# Patient Record
Sex: Male | Born: 1945
Health system: Southern US, Community
[De-identification: ages and names within clinical notes are randomized; demographics above are authoritative.]

## PROBLEM LIST (undated history)

## (undated) DIAGNOSIS — G510 Bell's palsy: Secondary | ICD-10-CM

## (undated) DIAGNOSIS — I639 Cerebral infarction, unspecified: Secondary | ICD-10-CM

## (undated) DIAGNOSIS — I119 Hypertensive heart disease without heart failure: Secondary | ICD-10-CM

## (undated) DIAGNOSIS — K219 Gastro-esophageal reflux disease without esophagitis: Secondary | ICD-10-CM

## (undated) DIAGNOSIS — I10 Essential (primary) hypertension: Secondary | ICD-10-CM

## (undated) DIAGNOSIS — I25110 Atherosclerotic heart disease of native coronary artery with unstable angina pectoris: Secondary | ICD-10-CM

## (undated) DIAGNOSIS — I219 Acute myocardial infarction, unspecified: Secondary | ICD-10-CM

## (undated) DIAGNOSIS — E1142 Type 2 diabetes mellitus with diabetic polyneuropathy: Secondary | ICD-10-CM

## (undated) DIAGNOSIS — E7800 Pure hypercholesterolemia, unspecified: Secondary | ICD-10-CM

## (undated) DIAGNOSIS — E119 Type 2 diabetes mellitus without complications: Secondary | ICD-10-CM

## (undated) HISTORY — PX: CARDIAC SURGERY: SHX584

## (undated) HISTORY — PX: OTHER SURGICAL HISTORY: SHX169

## (undated) HISTORY — DX: Gastro-esophageal reflux disease without esophagitis: K21.9

## (undated) HISTORY — DX: Essential (primary) hypertension: I10

## (undated) HISTORY — DX: Acute myocardial infarction, unspecified: I21.9

---
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
## 2010-09-18 ENCOUNTER — Ambulatory Visit
Admission: RE | Admit: 2010-09-18 | Discharge: 2010-09-18 | Disposition: A | Discharge status: Home / Self Care | Payer: Medicare HMO | Source: Ambulatory Visit | Attending: Internal Medicine | Admitting: Internal Medicine

## 2010-09-18 ENCOUNTER — Other Ambulatory Visit: Payer: Self-pay | Admitting: Internal Medicine

## 2010-09-18 DIAGNOSIS — R053 Chronic cough: Secondary | ICD-10-CM

## 2010-09-18 DIAGNOSIS — R05 Cough: Secondary | ICD-10-CM

## 2011-06-27 ENCOUNTER — Other Ambulatory Visit: Payer: Self-pay | Admitting: Diagnostic Neuroimaging

## 2011-06-27 DIAGNOSIS — G510 Bell's palsy: Secondary | ICD-10-CM

## 2011-07-10 ENCOUNTER — Ambulatory Visit
Admission: RE | Admit: 2011-07-10 | Discharge: 2011-07-10 | Disposition: A | Discharge status: Home / Self Care | Payer: Medicare HMO | Source: Ambulatory Visit | Attending: Diagnostic Neuroimaging | Admitting: Diagnostic Neuroimaging

## 2011-07-10 DIAGNOSIS — G510 Bell's palsy: Secondary | ICD-10-CM

## 2011-07-10 MED ORDER — GADOBENATE DIMEGLUMINE 529 MG/ML IV SOLN
20.0000 mL | Freq: Once | INTRAVENOUS | Status: AC | PRN
  Administered 2011-07-10: 20 mL via INTRAVENOUS

## 2011-07-22 ENCOUNTER — Other Ambulatory Visit: Payer: Medicare HMO

## 2011-12-24 ENCOUNTER — Ambulatory Visit: Payer: Medicare HMO | Admitting: *Deleted

## 2012-01-16 ENCOUNTER — Encounter: Payer: Self-pay | Admitting: *Deleted

## 2012-01-16 ENCOUNTER — Encounter: Payer: Medicare HMO | Attending: Specialist | Admitting: *Deleted

## 2012-01-16 DIAGNOSIS — E119 Type 2 diabetes mellitus without complications: Secondary | ICD-10-CM | POA: Insufficient documentation

## 2012-01-16 DIAGNOSIS — Z713 Dietary counseling and surveillance: Secondary | ICD-10-CM | POA: Insufficient documentation

## 2012-01-16 NOTE — Progress Notes (Signed)
  Medical Nutrition Therapy:  Appt start time: 1030 end time:  1130.  Assessment:  Primary concerns today: patient here with his nephew for diabetes education. He states history of Diabetes for the past 2 years with no diabetes education before today. He enjoys swimming and would like to incorporate that as an activity plan. He is not checking his BG at this time. He is in the habit of skipping meals often and nephew prepares his evening meal as he does not cook.  MEDICATIONS: see list. Diabetes medication is currently Metformin   DIETARY INTAKE:  Usual eating pattern includes 1 meals and 1-2 snacks per day.  Everyday foods include whatever his nephew prepares.  Avoided foods include asparagus, ham, milk.    24-hr recall:  B ( AM): skip  Snk ( AM): none  L ( PM): none Snk ( PM): fresh fruit occasionally D ( PM): nephew cooks usually, meat, starch occasionally, vegetables, OR sandwich Snk ( PM): pears, fewer crackers and PNB than before, make a sandwich, occasional apple pie Beverages: water, ginger-ale,   Usual physical activity: none right now, likes to swim  Estimated energy needs: 1500 calories 170 g carbohydrates 112 g protein 42 g fat  Progress Towards Goal(s):  In progress.   Nutritional Diagnosis:  NI-1.5 Excessive energy intake As related to activity level.  As evidenced by BMI of 38.8.    Intervention:  Nutrition counseling and diabetes education initiated. Discussed basic physiology of diabetes, SMBG and rationale of checking BG at alternate times of day, A1c, Carb Counting and reading food labels, and benefits of increased activity.  Handouts given during visit include: Living Well with Diabetes Carb Counting and Food Label handouts Meal Plan Card  Monitoring/Evaluation:  Dietary intake, exercise, food labels, and body weight in 4 week(s).

## 2012-01-21 NOTE — Patient Instructions (Addendum)
Goals:  Follow Diabetes Meal Plan as instructed  Eat 3 meals and 2 snacks, every 3-5 hrs  Limit carbohydrate intake to 60 grams carbohydrate/meal  Limit carbohydrate intake to 0-30 grams carbohydrate/snack  Add lean protein foods to meals/snacks  Monitor glucose levels as instructed by your doctor  Aim for 15-30 mins of physical activity daily  Bring food record and glucose log to your next nutrition visit

## 2012-02-13 ENCOUNTER — Encounter: Payer: Self-pay | Admitting: *Deleted

## 2012-02-13 ENCOUNTER — Encounter: Payer: Medicare HMO | Attending: Specialist | Admitting: *Deleted

## 2012-02-13 DIAGNOSIS — E119 Type 2 diabetes mellitus without complications: Secondary | ICD-10-CM | POA: Insufficient documentation

## 2012-02-13 DIAGNOSIS — Z713 Dietary counseling and surveillance: Secondary | ICD-10-CM | POA: Insufficient documentation

## 2012-02-13 NOTE — Patient Instructions (Addendum)
Plan: Stop at [facility] to determine insurance coverage for membership Consider swimming 3 days a week to increase activity level Consider walking at the [facility] if swimming doesn't work out or in addition to swimming as able Continue checking your Blood sugar 1-2 times a day as directed by MD Consider choosing to have dessert only 2 days/nights a week, such as Tuesday and Friday. Be sure and check your BG after you exercise to see the benefit to your diabetes management.

## 2012-02-13 NOTE — Progress Notes (Signed)
  Medical Nutrition Therapy:  Appt start time: 1030 end time:  1100.  Assessment:  Primary concerns today: patient here for follow up visit for diabetes. He is alone this visit. Weight indicates weight gain of 4 pounds in past month. He states he has changed his food choices but not his habits of skipping meals all day and eating excessively at night. He still consumes higher carb foods and desserts but is more aware of these choices since last visit. Still not exercising but interested in joining [employer] to swim if his insurance will cover the cost. He is now checking his BG twice daily and states they are typically in the 120 - 180 mg/dl range. He also states he is buying a house and getting ready to move.  MEDICATIONS: see list. Diabetes medication is currently Metformin   DIETARY INTAKE:  Usual eating pattern includes 1 meals and 1-2 snacks per day.  Everyday foods include whatever his nephew prepares.  Avoided foods include asparagus, ham, milk.    24-hr recall:  B ( AM): continues to skip  Snk ( AM): none  L ( PM): none Snk ( PM): fresh fruit occasionally D ( PM): nephew cooks usually, meat, starch occasionally, vegetables, OR sandwich Snk ( PM): pears, fewer crackers and PNB than before, make a sandwich, occasional apple pie Beverages: water, ginger-ale,   Usual physical activity: none right now, likes to swim. Plans to stop at [facility] after his appointments today to determine if his insurance will cover the cost. Also mentioned walking at the mall as a back up plan to swimming.  Estimated energy needs: 1500 calories 170 g carbohydrates 112 g protein 42 g fat  Progress Towards Goal(s):  In progress.   Nutritional Diagnosis:  NI-1.5 Excessive energy intake As related to activity level.  As evidenced by BMI of 38.8 which increased today to 39.3.    Intervention:  Reviewed all the benefits to increasing his activity level, commended him on testing his BG more often, and acknowledged  his frequent choice for desserts, suggesting he choose 2 specific days a week rather than no plan at all. Encouraged him to stop at [facility] today to see what his options for pool exercises might be. Reviewed Carb Counting concept and to continue with SMBG.  Monitoring/Evaluation:  Dietary intake, exercise, food labels, and body weight again in 4 week(s).

## 2012-03-12 ENCOUNTER — Ambulatory Visit: Payer: Medicare HMO | Admitting: *Deleted

## 2012-03-26 ENCOUNTER — Ambulatory Visit: Payer: Medicare HMO | Admitting: *Deleted

## 2012-04-06 ENCOUNTER — Ambulatory Visit: Payer: Medicare HMO | Admitting: *Deleted

## 2012-06-04 ENCOUNTER — Encounter (HOSPITAL_COMMUNITY): Payer: Self-pay | Admitting: Cardiology

## 2012-06-04 ENCOUNTER — Emergency Department (HOSPITAL_COMMUNITY)
Admission: EM | Admit: 2012-06-04 | Discharge: 2012-06-04 | Disposition: A | Discharge status: Home / Self Care | Payer: Medicare HMO | Attending: Emergency Medicine | Admitting: Emergency Medicine

## 2012-06-04 DIAGNOSIS — M19019 Primary osteoarthritis, unspecified shoulder: Secondary | ICD-10-CM | POA: Insufficient documentation

## 2012-06-04 DIAGNOSIS — Z7982 Long term (current) use of aspirin: Secondary | ICD-10-CM | POA: Insufficient documentation

## 2012-06-04 DIAGNOSIS — Z87891 Personal history of nicotine dependence: Secondary | ICD-10-CM | POA: Insufficient documentation

## 2012-06-04 DIAGNOSIS — Z79899 Other long term (current) drug therapy: Secondary | ICD-10-CM | POA: Insufficient documentation

## 2012-06-04 DIAGNOSIS — K219 Gastro-esophageal reflux disease without esophagitis: Secondary | ICD-10-CM | POA: Insufficient documentation

## 2012-06-04 DIAGNOSIS — K089 Disorder of teeth and supporting structures, unspecified: Secondary | ICD-10-CM | POA: Insufficient documentation

## 2012-06-04 DIAGNOSIS — M1990 Unspecified osteoarthritis, unspecified site: Secondary | ICD-10-CM

## 2012-06-04 DIAGNOSIS — I10 Essential (primary) hypertension: Secondary | ICD-10-CM | POA: Insufficient documentation

## 2012-06-04 DIAGNOSIS — I252 Old myocardial infarction: Secondary | ICD-10-CM | POA: Insufficient documentation

## 2012-06-04 DIAGNOSIS — J029 Acute pharyngitis, unspecified: Secondary | ICD-10-CM | POA: Insufficient documentation

## 2012-06-04 DIAGNOSIS — E119 Type 2 diabetes mellitus without complications: Secondary | ICD-10-CM | POA: Insufficient documentation

## 2012-06-04 DIAGNOSIS — K0889 Other specified disorders of teeth and supporting structures: Secondary | ICD-10-CM

## 2012-06-04 MED ORDER — HYDROCODONE-ACETAMINOPHEN 5-325 MG PO TABS
2.0000 | ORAL_TABLET | Freq: Once | ORAL | Status: AC
  Administered 2012-06-04: 2 via ORAL
  Filled 2012-06-04: qty 2

## 2012-06-04 MED ORDER — HYDROCODONE-ACETAMINOPHEN 5-325 MG PO TABS: 2.0000 | ORAL_TABLET | ORAL | Status: DC | PRN

## 2012-06-04 MED ORDER — PENICILLIN V POTASSIUM 250 MG PO TABS
500.0000 mg | ORAL_TABLET | Freq: Once | ORAL | Status: AC
  Administered 2012-06-04: 500 mg via ORAL
  Filled 2012-06-04: qty 2

## 2012-06-04 MED ORDER — PENICILLIN V POTASSIUM 500 MG PO TABS: 500.0000 mg | ORAL_TABLET | Freq: Three times a day (TID) | ORAL | Status: DC

## 2012-06-04 NOTE — ED Notes (Signed)
Reports arthritis in his shoulders and also his tooth on the back left side broke off and cut his tongue.

## 2012-06-04 NOTE — ED Provider Notes (Signed)
History     CSN: [id number]  Arrival date & time 06/04/12  1035   First MD Initiated Contact with Patient 06/04/12 1105      Chief Complaint  Patient presents with  . Shoulder Pain  . Dental Pain    (Consider location/radiation/quality/duration/timing/severity/associated sxs/prior treatment) Patient is a 67 y.o. male presenting with shoulder pain, tooth pain, and pharyngitis. The history is provided by the patient.  Shoulder Pain This is a chronic problem. Associated symptoms include a sore throat. Pertinent negatives include no chills or fever. Associated symptoms comments: Bilateral shoulder pain like chronic arthritis pain, currently uncontrolled as he is out of his Lortab 10 mg tablets. No new injury, no new swelling, fever or redness..  Dental PainThe primary symptoms include sore throat. Primary symptoms do not include fever.  Associated symptoms comments: He reports poor dentition generally but now with increased acute pain and a broken tooth that is causing trauma to the tongue..   Sore Throat This is a new problem. The current episode started yesterday. Associated symptoms include a sore throat. Pertinent negatives include no chills or fever. Associated symptoms comments: No fever or difficulty swallowing. .    Past Medical History  Diagnosis Date  . Hypertension   . Myocardial infarction   . Diabetes mellitus   . GERD (gastroesophageal reflux disease)     Past Surgical History  Procedure Date  . Stents     Family History  Problem Relation Age of Onset  . Heart disease Other     History  Substance Use Topics  . Smoking status: Former Smoker    Quit date: 01/16/2007  . Smokeless tobacco: Never Used  . Alcohol Use: Yes     Comment: 2/week, whiskey with Ginger-ale      Review of Systems  Constitutional: Negative for fever and chills.  HENT: Positive for sore throat and dental problem.   Respiratory: Negative.   Cardiovascular: Negative.     Gastrointestinal: Negative.   Musculoskeletal:       See HPI  Skin: Negative.   Neurological: Negative.     Allergies  Review of patient's allergies indicates no known allergies.  Home Medications   Current Outpatient Rx  Name  Route  Sig  Dispense  Refill  . ALOGLIPTIN-PIOGLITAZONE 25-15 MG PO TABS   Oral   Take by mouth daily.         Marland Kitchen AMITRIPTYLINE HCL 150 MG PO TABS   Oral   Take 150 mg by mouth at bedtime.         . ASPIRIN EC 81 MG PO TBEC   Oral   Take 81 mg by mouth daily.         . CHOLINE FENOFIBRATE 135 MG PO CPDR   Oral   Take 135 mg by mouth daily.         Marland Kitchen HYDROCODONE-ACETAMINOPHEN 10-325 MG PO TABS   Oral   Take 1 tablet by mouth every 6 (six) hours as needed. For pain.         Marland Kitchen LIRAGLUTIDE 18 MG/3ML [place] SOLN   Subcutaneous   Inject 1.2 mg into the skin daily.         . LUBIPROSTONE 24 MCG PO CAPS   Oral   Take 24 mcg by mouth 2 (two) times daily with a meal.         . MELOXICAM 15 MG PO TABS   Oral   Take 15 mg by mouth daily.         Marland Kitchen  METFORMIN HCL 1000 MG PO TABS   Oral   Take 1,000 mg by mouth 2 (two) times daily with a meal.         . METOPROLOL SUCCINATE ER 100 MG PO TB24   Oral   Take 100 mg by mouth daily. Take with or immediately following a meal.         . OMEPRAZOLE 40 MG PO CPDR   Oral   Take 40 mg by mouth daily.           BP 152/90  Pulse 93  Temp 98.3 F (36.8 C) (Oral)  Resp 18  SpO2 100%  Physical Exam  Constitutional: He is oriented to person, place, and time. He appears well-developed and well-nourished.  HENT:  Head: Normocephalic.       Poor dentition and partially edentulous. No acute fractures visualized.  Neck: Normal range of motion.  Cardiovascular:       PUlses in distal UE's are present and equal.  Pulmonary/Chest: Effort normal.  Musculoskeletal: Normal range of motion.       Shoulders with FROM bilaterally. No swelling or redness. No warmth.   Neurological: He is alert  and oriented to person, place, and time.  Skin: Skin is warm and dry.  Psychiatric: He has a normal mood and affect.    ED Course  Procedures (including critical care time)  Labs Reviewed - No data to display No results found.   No diagnosis found. 1. Arthritis, chronic 2. Dental pain 3. Sore throat   MDM  Chronic shoulder pain without new finding, out of pain medication. Sore throat where oropharynx appears benign and no fever. Generally poor dentition without visualized abscess but concerning for acutely increased pain and perceived fracture. Will give abx.        Dewaine Oats, PA-C 06/04/12 1150  Dewaine Oats, PA-C 06/18/12 1918

## 2012-06-04 NOTE — ED Notes (Signed)
Pt reports that he has broken molars on left side, up and down, and has bitten his tongue due to broken teeth. Also c/o chronic shoulder pain due to arthritis.

## 2012-12-03 ENCOUNTER — Emergency Department (HOSPITAL_COMMUNITY)
Admission: EM | Admit: 2012-12-03 | Discharge: 2012-12-04 | Payer: Medicare HMO | Attending: Emergency Medicine | Admitting: Emergency Medicine

## 2012-12-03 ENCOUNTER — Encounter (HOSPITAL_COMMUNITY): Payer: Self-pay

## 2012-12-03 ENCOUNTER — Emergency Department (HOSPITAL_COMMUNITY): Payer: Medicare HMO

## 2012-12-03 DIAGNOSIS — Z87891 Personal history of nicotine dependence: Secondary | ICD-10-CM | POA: Insufficient documentation

## 2012-12-03 DIAGNOSIS — E119 Type 2 diabetes mellitus without complications: Secondary | ICD-10-CM | POA: Insufficient documentation

## 2012-12-03 DIAGNOSIS — R079 Chest pain, unspecified: Secondary | ICD-10-CM | POA: Insufficient documentation

## 2012-12-03 DIAGNOSIS — Z7982 Long term (current) use of aspirin: Secondary | ICD-10-CM | POA: Insufficient documentation

## 2012-12-03 DIAGNOSIS — I252 Old myocardial infarction: Secondary | ICD-10-CM | POA: Insufficient documentation

## 2012-12-03 DIAGNOSIS — I10 Essential (primary) hypertension: Secondary | ICD-10-CM | POA: Insufficient documentation

## 2012-12-03 DIAGNOSIS — Z79899 Other long term (current) drug therapy: Secondary | ICD-10-CM | POA: Insufficient documentation

## 2012-12-03 DIAGNOSIS — K219 Gastro-esophageal reflux disease without esophagitis: Secondary | ICD-10-CM | POA: Insufficient documentation

## 2012-12-03 HISTORY — DX: Bell's palsy: G51.0

## 2012-12-03 LAB — BASIC METABOLIC PANEL
BUN: 20 mg/dL (ref 6–23)
CO2: 21 mEq/L (ref 19–32)
Calcium: 10.1 mg/dL (ref 8.4–10.5)
Chloride: 101 mEq/L (ref 96–112)
Creatinine, Ser: 1.53 mg/dL — ABNORMAL HIGH (ref 0.50–1.35)
GFR calc Af Amer: 53 mL/min — ABNORMAL LOW (ref >90)
GFR calc non Af Amer: 46 mL/min — ABNORMAL LOW (ref >90)
Glucose, Bld: 203 mg/dL — ABNORMAL HIGH (ref 70–99)
Potassium: 4 mEq/L (ref 3.5–5.1)
Sodium: 138 mEq/L (ref 135–145)

## 2012-12-03 LAB — CBC WITH DIFFERENTIAL/PLATELET
Basophils Absolute: 0 10*3/uL (ref 0.0–0.1)
Basophils Relative: 0 % (ref 0–1)
Eosinophils Absolute: 0.1 10*3/uL (ref 0.0–0.7)
Eosinophils Relative: 1 % (ref 0–5)
HCT: 41 % (ref 39.0–52.0)
Hemoglobin: 14.3 g/dL (ref 13.0–17.0)
Lymphocytes Relative: 34 % (ref 12–46)
Lymphs Abs: 2.9 10*3/uL (ref 0.7–4.0)
MCH: 30.8 pg (ref 26.0–34.0)
MCHC: 34.9 g/dL (ref 30.0–36.0)
MCV: 88.2 fL (ref 78.0–100.0)
Monocytes Absolute: 0.6 10*3/uL (ref 0.1–1.0)
Monocytes Relative: 7 % (ref 3–12)
Neutro Abs: 5 10*3/uL (ref 1.7–7.7)
Neutrophils Relative %: 58 % (ref 43–77)
Platelets: 168 10*3/uL (ref 150–400)
RBC: 4.65 MIL/uL (ref 4.22–5.81)
RDW: 12.6 % (ref 11.5–15.5)
WBC: 8.6 10*3/uL (ref 4.0–10.5)

## 2012-12-03 LAB — RAPID URINE DRUG SCREEN, HOSP PERFORMED
Amphetamines: NOT DETECTED
Barbiturates: NOT DETECTED
Benzodiazepines: NOT DETECTED
Cocaine: POSITIVE — AB
Opiates: NOT DETECTED
Tetrahydrocannabinol: NOT DETECTED

## 2012-12-03 LAB — POCT I-STAT TROPONIN I: Troponin i, poc: 0 ng/mL (ref 0.00–0.08)

## 2012-12-03 MED ORDER — GI COCKTAIL ~~LOC~~
30.0000 mL | Freq: Once | ORAL | Status: AC
  Administered 2012-12-03: 30 mL via ORAL
  Filled 2012-12-03: qty 30

## 2012-12-03 NOTE — Discharge Instructions (Signed)
Follow up with your primary care doctor if needed. Return to the ED for new or worsening symptoms.

## 2012-12-03 NOTE — ED Provider Notes (Signed)
History    CSN: [id number] Arrival date & time 12/03/12  1934  First MD Initiated Contact with Patient 12/03/12 1945     No chief complaint on file.  (Consider location/radiation/quality/duration/timing/severity/associated sxs/prior Treatment) The history is provided by the patient and medical records.   Patient presents to the ED for chest pain. The patient was being taken to jail for failing to appear in court. Once arriving at the jail he started complaining of chest pain- described as a mid-sternal burning sensation radiating to his throat. Pt does have hx of GERD.  Denies SOB, palpitations, dizziness, weakness, numbness, paresthesias of extremities.  Also notes he's been having some right flank pain with no associated urinary symptoms. No recent fevers, sweats, or chills.  Patient has had a prior MI with cardiac stenting several years ago. Pt has taken rolaids and other OTC antacids without relief of sx.  Has previously scheduled FU with PCP next week.  Past Medical History  Diagnosis Date  . Hypertension   . Myocardial infarction   . Diabetes mellitus   . GERD (gastroesophageal reflux disease)    Past Surgical History  Procedure Laterality Date  . Stents     Family History  Problem Relation Age of Onset  . Heart disease Other    History  Substance Use Topics  . Smoking status: Former Smoker    Quit date: 01/16/2007  . Smokeless tobacco: Never Used  . Alcohol Use: Yes     Comment: 2/week, whiskey with Ginger-ale    Review of Systems  Cardiovascular: Positive for chest pain.  All other systems reviewed and are negative.    Allergies  Review of patient's allergies indicates no known allergies.  Home Medications   Current Outpatient Rx  Name  Route  Sig  Dispense  Refill  . Alogliptin-Pioglitazone (OSENI) 25-15 MG TABS   Oral   Take 1 tablet by mouth daily.          Marland Kitchen amitriptyline (ELAVIL) 150 MG tablet   Oral   Take 150 mg by mouth at bedtime.         Marland Kitchen aspirin EC 81 MG tablet   Oral   Take 81 mg by mouth daily.         . chlordiazePOXIDE (LIBRIUM) 10 MG capsule   Oral   Take 10 mg by mouth 2 (two) times daily.         . Choline Fenofibrate (TRILIPIX) 135 MG capsule   Oral   Take 135 mg by mouth daily.         . diazepam (VALIUM) 5 MG tablet   Oral   Take 5 mg by mouth at bedtime.         Marland Kitchen HYDROcodone-acetaminophen (NORCO) 10-325 MG per tablet   Oral   Take 1 tablet by mouth every 6 (six) hours as needed. For pain.         . Liraglutide (VICTOZA) 18 MG/3ML SOLN   Subcutaneous   Inject 1.2 mg into the skin daily.         Marland Kitchen lubiprostone (AMITIZA) 24 MCG capsule   Oral   Take 24 mcg by mouth 2 (two) times daily with a meal.         . meloxicam (MOBIC) 15 MG tablet   Oral   Take 15 mg by mouth 2 (two) times daily.          . metFORMIN (GLUCOPHAGE) 1000 MG tablet   Oral   Take 1,000  mg by mouth 2 (two) times daily with a meal.         . metoprolol succinate (TOPROL-XL) 100 MG 24 hr tablet   Oral   Take 100 mg by mouth daily. Take with or immediately following a meal.         . omeprazole (PRILOSEC) 40 MG capsule   Oral   Take 40 mg by mouth daily.          There were no vitals taken for this visit.  Physical Exam  Nursing note and vitals reviewed. Constitutional: He is oriented to person, place, and time. He appears well-developed and well-nourished.  HENT:  Head: Normocephalic and atraumatic.  Mouth/Throat: Oropharynx is clear and moist.  Eyes: Conjunctivae and EOM are normal. Pupils are equal, round, and reactive to light.  Right lower eyelid droop and tearing- remnant of prior bell's palsy; pinpoint pupils, ERRL  Neck: Normal range of motion. Neck supple.  Cardiovascular: Normal rate, regular rhythm and normal heart sounds.   Pulmonary/Chest: Effort normal and breath sounds normal. No respiratory distress. He has no wheezes.    Abdominal: Soft. Bowel sounds are normal. There is no  tenderness. There is no guarding and no CVA tenderness.    Musculoskeletal: Normal range of motion. He exhibits no edema.  Neurological: He is alert and oriented to person, place, and time. He has normal strength. No cranial nerve deficit or sensory deficit.  Skin: Skin is warm and dry.  Psychiatric: He has a normal mood and affect.    ED Course  Procedures (including critical care time)   Date: 12/03/2012  Rate: 95  Rhythm: normal sinus rhythm  QRS Axis: normal  Intervals: normal  ST/T Wave abnormalities: normal  Conduction Disutrbances:none  Narrative Interpretation: borderline LAD, no STEMI  Old EKG Reviewed: unchanged   Labs Reviewed  BASIC METABOLIC PANEL - Abnormal; Notable for the following:    Glucose, Bld 203 (*)    Creatinine, Ser 1.53 (*)    GFR calc non Af Amer 46 (*)    GFR calc Af Amer 53 (*)    All other components within normal limits  URINE RAPID DRUG SCREEN (HOSP PERFORMED) - Abnormal; Notable for the following:    Cocaine POSITIVE (*)    All other components within normal limits  CBC WITH DIFFERENTIAL  POCT I-STAT TROPONIN I   Dg Chest 2 View  12/03/2012   *RADIOLOGY REPORT*  Clinical Data: Chest pain.  CHEST - 2 VIEW  Comparison: None.  Findings: Cardiopericardial silhouette within normal limits.  Mild atelectasis at the lung bases.  Trachea midline.  Mediastinal contours normal.  No airspace disease.  No pleural effusion.  IMPRESSION: Mild basilar atelectasis.  No acute cardiopulmonary disease.   Original Report Authenticated By: Dereck Ligas, M.D.   1. Chest pain     MDM   EKG NSR, no acute ischemic changes.  Trop negative.  CXR clear.  UDS + for cocaine.  Labs largely WNL as above.  Chest pain resolved with GI cocktail-- suspicion that pain is GERD related.  Doubt ACS, PE, dissection, or other vascular compromise.  Pt afebrile, non-toxic appearing, NAD, VS stable- ok for discharge.  Pt released to [employer] custody.    Larene Pickett, PA-C 12/04/12  0003

## 2012-12-03 NOTE — ED Notes (Signed)
Patient presents to ED via [place]. Patient c/o chest pain that began 2 days ago. Pt describes the pain as "burning in central chest." Pt told EMS that he had an MI in the past and the symptoms today feel the same as then. Denies any shortness of breath, stated that he vomited x1 this am. Patient also c/o right flank pain. Denies burning with urination. Denies injury/ trauma to that area. Escorted by [employer]. EMS gave 324 ASA and started 20G in left AC. BP 119/83. HR 120. A&Ox4 and no acute distress noted at this time.

## 2012-12-07 NOTE — ED Provider Notes (Signed)
Medical screening examination/treatment/procedure(s) were performed by non-physician practitioner and as supervising physician I was immediately available for consultation/collaboration.  Richarda Blade, MD 12/07/12 2152

## 2014-11-28 ENCOUNTER — Encounter (HOSPITAL_COMMUNITY): Payer: Self-pay | Admitting: Emergency Medicine

## 2014-11-28 ENCOUNTER — Emergency Department (HOSPITAL_COMMUNITY): Payer: Commercial Managed Care - HMO

## 2014-11-28 ENCOUNTER — Emergency Department (HOSPITAL_COMMUNITY)
Admission: EM | Admit: 2014-11-28 | Discharge: 2014-11-28 | Disposition: A | Discharge status: Home / Self Care | Payer: Commercial Managed Care - HMO | Attending: Emergency Medicine | Admitting: Emergency Medicine

## 2014-11-28 DIAGNOSIS — N189 Chronic kidney disease, unspecified: Secondary | ICD-10-CM | POA: Diagnosis not present

## 2014-11-28 DIAGNOSIS — M25552 Pain in left hip: Secondary | ICD-10-CM

## 2014-11-28 DIAGNOSIS — S79912A Unspecified injury of left hip, initial encounter: Secondary | ICD-10-CM | POA: Diagnosis not present

## 2014-11-28 DIAGNOSIS — I10 Essential (primary) hypertension: Secondary | ICD-10-CM

## 2014-11-28 DIAGNOSIS — I2510 Atherosclerotic heart disease of native coronary artery without angina pectoris: Secondary | ICD-10-CM | POA: Insufficient documentation

## 2014-11-28 DIAGNOSIS — W109XXA Fall (on) (from) unspecified stairs and steps, initial encounter: Secondary | ICD-10-CM | POA: Insufficient documentation

## 2014-11-28 DIAGNOSIS — E119 Type 2 diabetes mellitus without complications: Secondary | ICD-10-CM | POA: Diagnosis not present

## 2014-11-28 DIAGNOSIS — Z8673 Personal history of transient ischemic attack (TIA), and cerebral infarction without residual deficits: Secondary | ICD-10-CM | POA: Diagnosis not present

## 2014-11-28 DIAGNOSIS — Z8669 Personal history of other diseases of the nervous system and sense organs: Secondary | ICD-10-CM | POA: Insufficient documentation

## 2014-11-28 DIAGNOSIS — Y9389 Activity, other specified: Secondary | ICD-10-CM | POA: Diagnosis not present

## 2014-11-28 DIAGNOSIS — I129 Hypertensive chronic kidney disease with stage 1 through stage 4 chronic kidney disease, or unspecified chronic kidney disease: Secondary | ICD-10-CM | POA: Insufficient documentation

## 2014-11-28 DIAGNOSIS — Z9861 Coronary angioplasty status: Secondary | ICD-10-CM | POA: Insufficient documentation

## 2014-11-28 DIAGNOSIS — Y9289 Other specified places as the place of occurrence of the external cause: Secondary | ICD-10-CM | POA: Insufficient documentation

## 2014-11-28 DIAGNOSIS — Y998 Other external cause status: Secondary | ICD-10-CM | POA: Insufficient documentation

## 2014-11-28 HISTORY — DX: Type 2 diabetes mellitus without complications: E11.9

## 2014-11-28 HISTORY — DX: Cerebral infarction, unspecified: I63.9

## 2014-11-28 LAB — COMPREHENSIVE METABOLIC PANEL
ALT: 25 U/L (ref 17–63)
AST: 26 U/L (ref 15–41)
Albumin: 3.7 g/dL (ref 3.5–5.0)
Alkaline Phosphatase: 69 U/L (ref 38–126)
Anion gap: 10 (ref 5–15)
BUN: 11 mg/dL (ref 6–20)
CO2: 24 mmol/L (ref 22–32)
Calcium: 9 mg/dL (ref 8.9–10.3)
Chloride: 106 mmol/L (ref 101–111)
Creatinine, Ser: 1.42 mg/dL — ABNORMAL HIGH (ref 0.61–1.24)
GFR calc Af Amer: 58 mL/min — ABNORMAL LOW (ref >60)
GFR calc non Af Amer: 50 mL/min — ABNORMAL LOW (ref >60)
Glucose, Bld: 167 mg/dL — ABNORMAL HIGH (ref 65–99)
Potassium: 4.2 mmol/L (ref 3.5–5.1)
Sodium: 140 mmol/L (ref 135–145)
Total Bilirubin: 0.6 mg/dL (ref 0.3–1.2)
Total Protein: 6.8 g/dL (ref 6.5–8.1)

## 2014-11-28 NOTE — ED Notes (Signed)
During the middle of the night fell due to dizziness. Injured left hip. Pt ambulatory, distal pulses intact. Pt has previous hx of stroke, states slurred speech and facial droop are unchanged from old stroke.

## 2014-11-28 NOTE — ED Provider Notes (Signed)
CSN: [id number]     Arrival date & time 11/28/14  0720 History   First MD Initiated Contact with Patient 11/28/14 [phone]     Chief Complaint  Patient presents with  . Hip Pain  . Fall   HPI  David Burton is a 69 yo male with PMHx of CVA, CAD s/p 2 stents, T2DM, HLD, HTN, and Bell's Palsy who presents with complaint of left hip pain. Patient states he fell down 5 steps last night and primarily landed on his left side. He believes he caught himself with his hands and did not hit his head. Patient admits to having 4-5 shots of liquor last night in addition to taking an Ambien. He denies any lightheadedness, syncope, chest pain, shortness of breath or new stroke symptoms. Patient does have residual right side facial weakness from Bell's Palsy.   Currently pain is located in posterior left hip without radiation. Pain is an 8/10, worse with movement and ambulation. He denies any numbness, bruising or difficulty ambulating. He has not taken anything for the pain.   Past Medical History  Diagnosis Date  . Stroke   . Hypertension   . Diabetes mellitus without complication    Past Surgical History  Procedure Laterality Date  . Cardiac surgery     No family history on file. History  Substance Use Topics  . Smoking status: Not on file  . Smokeless tobacco: Not on file  . Alcohol Use: Yes     Comment: 8-10 beers    Review of Systems General: Denies fatigue, and diaphoresis.  Respiratory: Denies SOB, cough   Cardiovascular: Denies chest pain and palpitations.  Gastrointestinal: Denies nausea, vomiting, abdominal pain Musculoskeletal: Admits to left hip pain and myalgias. Denies back pain, and gait problem.  Skin: Denies bruising and wounds.  Neurological: Denies dizziness, headaches, weakness, lightheadedness, numbness, and syncope. Psychiatric/Behavioral: Denies confusion  Allergies  Review of patient's allergies indicates no known allergies.  Home Medications   Prior to Admission  medications   Not on File   Physical Exam  Filed Vitals:   11/28/14 0730 11/28/14 0735 11/28/14 0800 11/28/14 0845  BP: 158/77 158/77 187/92 166/85  Pulse: 94 90 101   Temp:  97.5 F (36.4 C)    Resp:  16 20   SpO2: 99% 97% 100%    General: Vital signs reviewed.  Patient is well-developed and well-nourished, in no acute distress and cooperative with exam.  HEENT: EOMI. Right facial droop on superior and inferior portions, normal sensation. Cardiovascular: RRR, S1 normal, S2 normal. Pulmonary/Chest: Clear to auscultation bilaterally, no wheezes, rales, or rhonchi. Abdominal: Soft, non-tender, non-distended, BS + Musculoskeletal: Tender on palpation of left lateral hip and femur. No obvious trauma or ecchymosis. Normal ROM. Pain with hip flexion. Extremities: No lower extremity edema bilaterally, pulses symmetric and intact bilaterally.   Neurologic Exam:   Mental Status: Alert, oriented, thought content appropriate.  Speech fluent without evidence of aphasia. Able to follow 3 step commands without difficulty.  Cranial Nerves:   II: Visual fields grossly intact.  III/IV/VI: Extraocular movements intact.  Pupils reactive bilaterally.  V/VII: Smile asymmetric (droop on right)-unchanged from baseline. facial light touch sensation normal bilaterally.  VIII: Grossly intact.  XI: Bilateral shoulder shrug normal.  XII: Midline tongue extension normal.  Motor:  5/5 bilaterally with normal tone and bulk  Sensory:  Pinprick and light touch intact throughout, bilaterally     ED Course  Procedures (including critical care time) Labs Review Labs Reviewed  COMPREHENSIVE METABOLIC PANEL - Abnormal; Notable for the following:    Glucose, Bld 167 (*)    Creatinine, Ser 1.42 (*)    GFR calc non Af Amer 50 (*)    GFR calc Af Amer 58 (*)    All other components within normal limits    Imaging Review Dg Hip Unilat With Pelvis 2-3 Views Left  11/28/2014   CLINICAL DATA:  Status post fall  last night, persistent left hip discomfort  EXAM: LEFT HIP (WITH PELVIS) 2-3 VIEWS  COMPARISON:  None.  FINDINGS: The bony pelvis is adequately mineralized. The observed portions of the sacrum are unremarkable. There is mild symmetric narrowing of the left hip joint space. The femoral head, neck, and intertrochanteric regions are intact. The acetabulum and pubic rami exhibit no definite acute abnormality. Subtle double density in the midportion of the inferior pubic ramus is demonstrated without definite cortical disruption.  IMPRESSION: There is mild degenerative joint space narrowing of the left hip. There is no definite acute fracture nor dislocation.  If the patient's symptoms do not resolve in a fashion consistent with an uncomplicated contusion, MRI would be a useful next imaging step.   Electronically Signed   By: David  Martinique M.D.   On: 11/28/2014 08:29     EKG Interpretation   Date/Time:  Monday November 28 2014 07:36:41 EDT Ventricular Rate:  90 PR Interval:  143 QRS Duration: 79 QT Interval:  364 QTC Calculation: 445 R Axis:   -24 Text Interpretation:  Sinus rhythm Abnormal R-wave progression, early  transition Inferior infarct, old Confirmed by BEATON  MD, ROBERT ([id number])  on 11/28/2014 7:43:44 AM      MDM   Final diagnoses:  Left hip pain   69 yo male presenting with left hip pain after a fall. I suspect the fall was secondary to alcohol use and taking an Ambien. Doubt cardiac or neurologic cause given normal exam, normal EKG and lack of symptoms. Given significant hip pain, will get an xray to rule out left hip fracture or dislocation.  Left hip xray showed mild degenerative joint space narrowing of the left hip, but no definite acute fracture nor dislocation.  CMET revealed increased creatinine indicative of likely CKD, but unsure of baseline; therefore, we will avoid NSAIDs.   This was discussed with patient. He states he has some vicodin/norco left at home prescribed by his  pain management doctor that he can take. I told him that he could either take this or tylenol, but not to take both. Patient should also follow up with his PCP for monitoring of his CKD and HTN. Patient states he will make an appointment for tomorrow.   Case discussed in full with Dr. Audie Pinto, ED attending physician.   David Craver, DO PGY-1 Internal Medicine Resident Pager # [phone] 11/28/2014 9:17 AM     David Caddy Sherral Hammers, MD 11/28/14 [place], MD 11/28/14 [phone]

## 2014-11-28 NOTE — Discharge Instructions (Signed)
Thank you for allowing [place] to be involved in your healthcare while you were hospitalized at [facility].   Please note that there have not been changes to your home medications.  --> PLEASE LOOK AT YOUR DISCHARGE MEDICATION LIST FOR DETAILS.  Please call your PCP if you have any questions or concerns, or any difficulty getting any of your medications.  Please return to the ER if you have worsening of your symptoms or new severe symptoms arise.   TAKE YOUR HOME NORCO/VICODIN OR TYLENOL. YOU DO NOT NEED TO TAKE BOTH OF THESE MEDICATIONS. YOU CAN ALSO APPLY HEAT OR ICE TO THE AREA TO HELP WITH PAIN AND SWELLING.  FOLLOW UP WITH YOUR PRIMARY CARE DOCTOR FOR YOUR HIGH BLOOD PRESSURE AND YOUR KIDNEY DISEASE.  Muscle Pain Muscle pain (myalgia) may be caused by many things, including:  Overuse or muscle strain, especially if you are not in shape. This is the most common cause of muscle pain.  Injury.  Bruises.  Viruses, such as the flu.  Infectious diseases.  Fibromyalgia, which is a chronic condition that causes muscle tenderness, fatigue, and headache.  Autoimmune diseases, including lupus.  Certain drugs, including ACE inhibitors and statins. Muscle pain may be mild or severe. In most cases, the pain lasts only a short time and goes away without treatment. To diagnose the cause of your muscle pain, your health care provider will take your medical history. This means he or she will ask you when your muscle pain began and what has been happening. If you have not had muscle pain for very long, your health care provider may want to wait before doing much testing. If your muscle pain has lasted a long time, your health care provider may want to run tests right away. If your health care provider thinks your muscle pain may be caused by illness, you may need to have additional tests to rule out certain conditions.  Treatment for muscle pain depends on the cause. Home care is  often enough to relieve muscle pain. Your health care provider may also prescribe anti-inflammatory medicine. HOME CARE INSTRUCTIONS Watch your condition for any changes. The following actions may help to lessen any discomfort you are feeling:  Only take over-the-counter or prescription medicines as directed by your health care provider.  Apply ice to the sore muscle:  Put ice in a plastic bag.  Place a towel between your skin and the bag.  Leave the ice on for 15-20 minutes, 3-4 times a day.  You may alternate applying hot and cold packs to the muscle as directed by your health care provider.  If overuse is causing your muscle pain, slow down your activities until the pain goes away.  Remember that it is normal to feel some muscle pain after starting a workout program. Muscles that have not been used often will be sore at first.  Do regular, gentle exercises if you are not usually active.  Warm up before exercising to lower your risk of muscle pain.  Do not continue working out if the pain is very bad. Bad pain could mean you have injured a muscle. SEEK MEDICAL CARE IF:  Your muscle pain gets worse, and medicines do not help.  You have muscle pain that lasts longer than 3 days.  You have a rash or fever along with muscle pain.  You have muscle pain after a tick bite.  You have muscle pain while working out, even though you are in  good physical condition.  You have redness, soreness, or swelling along with muscle pain.  You have muscle pain after starting a new medicine or changing the dose of a medicine. SEEK IMMEDIATE MEDICAL CARE IF:  You have trouble breathing.  You have trouble swallowing.  You have muscle pain along with a stiff neck, fever, and vomiting.  You have severe muscle weakness or cannot move part of your body. MAKE SURE YOU:   Understand these instructions.  Will watch your condition.  Will get help right away if you are not doing well or get  worse. Document Released: 04/11/2006 Document Revised: 05/25/2013 Document Reviewed: 03/16/2013 [facility] Patient Information 2015 [place], [place]. This information is not intended to replace advice given to you by your health care provider. Make sure you discuss any questions you have with your health care provider.

## 2014-12-20 ENCOUNTER — Encounter (HOSPITAL_COMMUNITY): Payer: Self-pay

## 2015-01-02 ENCOUNTER — Encounter (HOSPITAL_COMMUNITY): Payer: Self-pay | Admitting: Nurse Practitioner

## 2015-01-02 ENCOUNTER — Emergency Department (HOSPITAL_COMMUNITY)
Admission: EM | Admit: 2015-01-02 | Discharge: 2015-01-02 | Disposition: A | Discharge status: Home / Self Care | Payer: Commercial Managed Care - HMO | Attending: Emergency Medicine | Admitting: Emergency Medicine

## 2015-01-02 DIAGNOSIS — Z79899 Other long term (current) drug therapy: Secondary | ICD-10-CM | POA: Diagnosis not present

## 2015-01-02 DIAGNOSIS — E119 Type 2 diabetes mellitus without complications: Secondary | ICD-10-CM | POA: Insufficient documentation

## 2015-01-02 DIAGNOSIS — I252 Old myocardial infarction: Secondary | ICD-10-CM | POA: Diagnosis not present

## 2015-01-02 DIAGNOSIS — Z8673 Personal history of transient ischemic attack (TIA), and cerebral infarction without residual deficits: Secondary | ICD-10-CM | POA: Diagnosis not present

## 2015-01-02 DIAGNOSIS — I10 Essential (primary) hypertension: Secondary | ICD-10-CM | POA: Insufficient documentation

## 2015-01-02 DIAGNOSIS — Z8719 Personal history of other diseases of the digestive system: Secondary | ICD-10-CM | POA: Diagnosis not present

## 2015-01-02 DIAGNOSIS — F1010 Alcohol abuse, uncomplicated: Secondary | ICD-10-CM | POA: Insufficient documentation

## 2015-01-02 DIAGNOSIS — Z791 Long term (current) use of non-steroidal anti-inflammatories (NSAID): Secondary | ICD-10-CM | POA: Insufficient documentation

## 2015-01-02 DIAGNOSIS — Z8669 Personal history of other diseases of the nervous system and sense organs: Secondary | ICD-10-CM | POA: Insufficient documentation

## 2015-01-02 DIAGNOSIS — Z7982 Long term (current) use of aspirin: Secondary | ICD-10-CM | POA: Diagnosis not present

## 2015-01-02 LAB — COMPREHENSIVE METABOLIC PANEL
ALT: 22 U/L (ref 17–63)
AST: 25 U/L (ref 15–41)
Albumin: 3.1 g/dL — ABNORMAL LOW (ref 3.5–5.0)
Alkaline Phosphatase: 57 U/L (ref 38–126)
Anion gap: 8 (ref 5–15)
BUN: 7 mg/dL (ref 6–20)
CO2: 25 mmol/L (ref 22–32)
Calcium: 9 mg/dL (ref 8.9–10.3)
Chloride: 106 mmol/L (ref 101–111)
Creatinine, Ser: 1.39 mg/dL — ABNORMAL HIGH (ref 0.61–1.24)
GFR calc Af Amer: 59 mL/min — ABNORMAL LOW (ref >60)
GFR calc non Af Amer: 51 mL/min — ABNORMAL LOW (ref >60)
Glucose, Bld: 111 mg/dL — ABNORMAL HIGH (ref 65–99)
Potassium: 3.6 mmol/L (ref 3.5–5.1)
Sodium: 139 mmol/L (ref 135–145)
Total Bilirubin: 1 mg/dL (ref 0.3–1.2)
Total Protein: 6.2 g/dL — ABNORMAL LOW (ref 6.5–8.1)

## 2015-01-02 LAB — CBC WITH DIFFERENTIAL/PLATELET
Basophils Absolute: 0 10*3/uL (ref 0.0–0.1)
Basophils Relative: 0 % (ref 0–1)
Eosinophils Absolute: 0.1 10*3/uL (ref 0.0–0.7)
Eosinophils Relative: 1 % (ref 0–5)
HCT: 37 % — ABNORMAL LOW (ref 39.0–52.0)
Hemoglobin: 12.9 g/dL — ABNORMAL LOW (ref 13.0–17.0)
Lymphocytes Relative: 45 % (ref 12–46)
Lymphs Abs: 3.2 10*3/uL (ref 0.7–4.0)
MCH: 30.4 pg (ref 26.0–34.0)
MCHC: 34.9 g/dL (ref 30.0–36.0)
MCV: 87.1 fL (ref 78.0–100.0)
Monocytes Absolute: 0.5 10*3/uL (ref 0.1–1.0)
Monocytes Relative: 7 % (ref 3–12)
Neutro Abs: 3.3 10*3/uL (ref 1.7–7.7)
Neutrophils Relative %: 46 % (ref 43–77)
Platelets: 141 10*3/uL — ABNORMAL LOW (ref 150–400)
RBC: 4.25 MIL/uL (ref 4.22–5.81)
RDW: 13.7 % (ref 11.5–15.5)
WBC: 7.1 10*3/uL (ref 4.0–10.5)

## 2015-01-02 LAB — RAPID URINE DRUG SCREEN, HOSP PERFORMED
Amphetamines: NOT DETECTED
Barbiturates: NOT DETECTED
Benzodiazepines: NOT DETECTED
Cocaine: NOT DETECTED
Opiates: NOT DETECTED
Tetrahydrocannabinol: NOT DETECTED

## 2015-01-02 LAB — URINALYSIS, ROUTINE W REFLEX MICROSCOPIC
Glucose, UA: NEGATIVE mg/dL
Hgb urine dipstick: NEGATIVE
Ketones, ur: NEGATIVE mg/dL
Nitrite: NEGATIVE
Protein, ur: 30 mg/dL — AB
Specific Gravity, Urine: 1.022 (ref 1.005–1.030)
Urobilinogen, UA: 1 mg/dL (ref 0.0–1.0)
pH: 5.5 (ref 5.0–8.0)

## 2015-01-02 LAB — URINE MICROSCOPIC-ADD ON

## 2015-01-02 LAB — ETHANOL: Alcohol, Ethyl (B): 106 mg/dL — ABNORMAL HIGH (ref <5)

## 2015-01-02 NOTE — ED Provider Notes (Signed)
CSN: [id number]     Arrival date & time 01/02/15  1648 History   First MD Initiated Contact with Patient 01/02/15 1710     Chief Complaint  Patient presents with  . Alcohol Problem     (Consider location/radiation/quality/duration/timing/severity/associated sxs/prior Treatment) HPI Patient presents to the emergency department with alcohol dependence, requesting detox from alcohol.  The patient states that he would like to go to a detox facility.  He states that he normally drinks 2 pints of liquor a day.  The patient denies nausea, vomiting, weakness, dizziness, headache, blurred vision, tremors, chest pain, shortness breath, abdominal pain, hallucinations, or syncope.  The patient states that he spoke with his primary doctor who sent him to the emergency department Past Medical History  Diagnosis Date  . Myocardial infarction   . Diabetes mellitus   . GERD (gastroesophageal reflux disease)   . Bell palsy   . Stroke   . Hypertension   . Diabetes mellitus without complication    Past Surgical History  Procedure Laterality Date  . Stents    . Cardiac surgery     Family History  Problem Relation Age of Onset  . Heart disease Other    History  Substance Use Topics  . Smoking status: Not on file  . Smokeless tobacco: Not on file  . Alcohol Use: Yes     Comment: 2/week, whiskey with Ginger-ale    Review of Systems  All other systems negative except as documented in the HPI. All pertinent positives and negatives as reviewed in the HPI.  Allergies  Review of patient's allergies indicates no known allergies.  Home Medications   Prior to Admission medications   Medication Sig Start Date End Date Taking? Authorizing Provider  Alogliptin-Pioglitazone (OSENI) 25-15 MG TABS Take 1 tablet by mouth daily.    Yes Historical Provider, MD  amitriptyline (ELAVIL) 150 MG tablet Take 150 mg by mouth at bedtime.   Yes Historical Provider, MD  aspirin EC 81 MG tablet Take 81 mg by mouth  daily.   Yes Historical Provider, MD  chlordiazePOXIDE (LIBRIUM) 10 MG capsule Take 10 mg by mouth 2 (two) times daily.   Yes Historical Provider, MD  Choline Fenofibrate (TRILIPIX) 135 MG capsule Take 135 mg by mouth daily.   Yes Historical Provider, MD  diazepam (VALIUM) 5 MG tablet Take 5 mg by mouth at bedtime.   Yes Historical Provider, MD  HYDROcodone-acetaminophen (NORCO) 10-325 MG per tablet Take 1 tablet by mouth every 6 (six) hours as needed. For pain.   Yes Historical Provider, MD  lubiprostone (AMITIZA) 24 MCG capsule Take 24 mcg by mouth 2 (two) times daily with a meal.   Yes Historical Provider, MD  meloxicam (MOBIC) 15 MG tablet Take 15 mg by mouth 2 (two) times daily.    Yes Historical Provider, MD  metFORMIN (GLUCOPHAGE) 1000 MG tablet Take 1,000 mg by mouth 2 (two) times daily with a meal.   Yes Historical Provider, MD  metoprolol succinate (TOPROL-XL) 100 MG 24 hr tablet Take 100 mg by mouth daily. Take with or immediately following a meal.   Yes Historical Provider, MD   BP 107/87 mmHg  Pulse 60  Temp(Src) 97.4 F (36.3 C) (Oral)  Resp 24  SpO2 99% Physical Exam  Constitutional: He is oriented to person, place, and time. He appears well-developed and well-nourished. No distress.  HENT:  Head: Normocephalic and atraumatic.  Mouth/Throat: Oropharynx is clear and moist.  Eyes: Pupils are equal, round, and reactive  to light.  Neck: Normal range of motion. Neck supple. No thyromegaly present.  Cardiovascular: Normal rate, regular rhythm and normal heart sounds.  Exam reveals no gallop and no friction rub.   No murmur heard. Pulmonary/Chest: Effort normal and breath sounds normal. No respiratory distress.  Neurological: He is alert and oriented to person, place, and time. No cranial nerve deficit. He exhibits normal muscle tone. Coordination normal.  Skin: Skin is warm and dry. No rash noted. No erythema.  Psychiatric: He has a normal mood and affect. His behavior is normal.   Nursing note and vitals reviewed.   ED Course  Procedures (including critical care time) Labs Review Labs Reviewed  URINALYSIS, ROUTINE W REFLEX MICROSCOPIC (NOT AT [facility]) - Abnormal; Notable for the following:    Color, Urine AMBER (*)    APPearance CLOUDY (*)    Bilirubin Urine SMALL (*)    Protein, ur 30 (*)    Leukocytes, UA SMALL (*)    All other components within normal limits  ETHANOL - Abnormal; Notable for the following:    Alcohol, Ethyl (B) 106 (*)    All other components within normal limits  COMPREHENSIVE METABOLIC PANEL - Abnormal; Notable for the following:    Glucose, Bld 111 (*)    Creatinine, Ser 1.39 (*)    Total Protein 6.2 (*)    Albumin 3.1 (*)    GFR calc non Af Amer 51 (*)    GFR calc Af Amer 59 (*)    All other components within normal limits  CBC WITH DIFFERENTIAL/PLATELET - Abnormal; Notable for the following:    Hemoglobin 12.9 (*)    HCT 37.0 (*)    Platelets 141 (*)    All other components within normal limits  URINE MICROSCOPIC-ADD ON - Abnormal; Notable for the following:    Squamous Epithelial / LPF MANY (*)    All other components within normal limits  URINE RAPID DRUG SCREEN, HOSP PERFORMED   A (resources for outpatient treatment.  The patient states that he would not be able to go anywhere.  Today, he would have to wait several days.  He spoke with his insurance company who states that we will cover this for him.  I will give any resources and told him to follow-up with them.  He is advised return here as needed   Dalia Heading, PA-C 01/02/15 [place], MD 01/03/15 [phone]

## 2015-01-02 NOTE — ED Notes (Signed)
Patient given urinal and advised to obtain urine specimen

## 2015-01-02 NOTE — ED Notes (Signed)
Patient anxious to go. Walked out to front at this time.

## 2015-01-02 NOTE — ED Notes (Signed)
He reports he hasnt slept well in days, he is concerned that it may be related to his alcohol use. States he is a heavy drinker and drinks a pint of liquor a day. Denies any other substance use. He denies so, hi. He c/o generalized body pain due to arthritis. He is A&Ox4, breathing easily

## 2015-01-02 NOTE — Discharge Instructions (Signed)
Follow-up with the resources provided. return here as needed.  Also follow-up with your primary care doctor

## 2015-01-02 NOTE — ED Notes (Signed)
Patient reports drinking three mini bottles of Gin in the last 24 hours.

## 2015-02-18 ENCOUNTER — Encounter (HOSPITAL_COMMUNITY): Payer: Self-pay | Admitting: Emergency Medicine

## 2015-02-18 ENCOUNTER — Emergency Department (HOSPITAL_COMMUNITY)
Admission: EM | Admit: 2015-02-18 | Discharge: 2015-02-18 | Disposition: A | Discharge status: Home / Self Care | Payer: Commercial Managed Care - HMO | Attending: Emergency Medicine | Admitting: Emergency Medicine

## 2015-02-18 DIAGNOSIS — S39012A Strain of muscle, fascia and tendon of lower back, initial encounter: Secondary | ICD-10-CM | POA: Diagnosis not present

## 2015-02-18 DIAGNOSIS — K219 Gastro-esophageal reflux disease without esophagitis: Secondary | ICD-10-CM | POA: Insufficient documentation

## 2015-02-18 DIAGNOSIS — Z791 Long term (current) use of non-steroidal anti-inflammatories (NSAID): Secondary | ICD-10-CM | POA: Insufficient documentation

## 2015-02-18 DIAGNOSIS — S3992XA Unspecified injury of lower back, initial encounter: Secondary | ICD-10-CM | POA: Diagnosis present

## 2015-02-18 DIAGNOSIS — Z79899 Other long term (current) drug therapy: Secondary | ICD-10-CM | POA: Insufficient documentation

## 2015-02-18 DIAGNOSIS — I252 Old myocardial infarction: Secondary | ICD-10-CM | POA: Diagnosis not present

## 2015-02-18 DIAGNOSIS — Y9289 Other specified places as the place of occurrence of the external cause: Secondary | ICD-10-CM | POA: Insufficient documentation

## 2015-02-18 DIAGNOSIS — Z8673 Personal history of transient ischemic attack (TIA), and cerebral infarction without residual deficits: Secondary | ICD-10-CM | POA: Diagnosis not present

## 2015-02-18 DIAGNOSIS — X58XXXA Exposure to other specified factors, initial encounter: Secondary | ICD-10-CM | POA: Diagnosis not present

## 2015-02-18 DIAGNOSIS — E119 Type 2 diabetes mellitus without complications: Secondary | ICD-10-CM | POA: Diagnosis not present

## 2015-02-18 DIAGNOSIS — Y939 Activity, unspecified: Secondary | ICD-10-CM | POA: Insufficient documentation

## 2015-02-18 DIAGNOSIS — M545 Low back pain, unspecified: Secondary | ICD-10-CM

## 2015-02-18 DIAGNOSIS — Y998 Other external cause status: Secondary | ICD-10-CM | POA: Diagnosis not present

## 2015-02-18 DIAGNOSIS — Z7982 Long term (current) use of aspirin: Secondary | ICD-10-CM | POA: Diagnosis not present

## 2015-02-18 DIAGNOSIS — I10 Essential (primary) hypertension: Secondary | ICD-10-CM | POA: Diagnosis not present

## 2015-02-18 LAB — CBC
HCT: 40.7 % (ref 39.0–52.0)
Hemoglobin: 14 g/dL (ref 13.0–17.0)
MCH: 30.7 pg (ref 26.0–34.0)
MCHC: 34.4 g/dL (ref 30.0–36.0)
MCV: 89.3 fL (ref 78.0–100.0)
Platelets: 143 10*3/uL — ABNORMAL LOW (ref 150–400)
RBC: 4.56 MIL/uL (ref 4.22–5.81)
RDW: 13.3 % (ref 11.5–15.5)
WBC: 7.8 10*3/uL (ref 4.0–10.5)

## 2015-02-18 LAB — URINALYSIS, ROUTINE W REFLEX MICROSCOPIC
Bilirubin Urine: NEGATIVE
Glucose, UA: NEGATIVE mg/dL
Hgb urine dipstick: NEGATIVE
Ketones, ur: NEGATIVE mg/dL
Leukocytes, UA: NEGATIVE
Nitrite: NEGATIVE
Protein, ur: NEGATIVE mg/dL
Specific Gravity, Urine: 1.02 (ref 1.005–1.030)
Urobilinogen, UA: 1 mg/dL (ref 0.0–1.0)
pH: 5.5 (ref 5.0–8.0)

## 2015-02-18 LAB — BASIC METABOLIC PANEL
Anion gap: 10 (ref 5–15)
BUN: 13 mg/dL (ref 6–20)
CO2: 24 mmol/L (ref 22–32)
Calcium: 9.3 mg/dL (ref 8.9–10.3)
Chloride: 104 mmol/L (ref 101–111)
Creatinine, Ser: 1.22 mg/dL (ref 0.61–1.24)
GFR calc Af Amer: >60 mL/min (ref >60)
GFR calc non Af Amer: 60 mL/min — ABNORMAL LOW (ref >60)
Glucose, Bld: 120 mg/dL — ABNORMAL HIGH (ref 65–99)
Potassium: 3.5 mmol/L (ref 3.5–5.1)
Sodium: 138 mmol/L (ref 135–145)

## 2015-02-18 LAB — CBG MONITORING, ED: Glucose-Capillary: 134 mg/dL — ABNORMAL HIGH (ref 65–99)

## 2015-02-18 MED ORDER — TIZANIDINE HCL 4 MG PO TABS: 4.0000 mg | ORAL_TABLET | Freq: Every evening | ORAL | Status: DC | PRN

## 2015-02-18 NOTE — ED Provider Notes (Signed)
CSN: [id number]     Arrival date & time 02/18/15  1421 History   First MD Initiated Contact with Patient 02/18/15 1642     Chief Complaint  Patient presents with  . Back Pain   Patient is a 69 y.o. male presenting with general illness. The history is provided by the patient. No language interpreter was used.  Illness Location:  Back Quality:  Pain Severity:  Moderate Onset quality:  Gradual Timing:  Constant Progression:  Unchanged Chronicity:  New Context:  PMHx of HTN, DM, CAD (previous MI), CVA, and GERD presenting with back pain. Lower back pain. Bilateral. Gradual onsets 2 days ago. Persistent without severe worsening. Denies current or previous injury or previous history of back surgery. Denies radiation, fever, chills, night sweats, hematochezia, hematuria, urinary or bowel incontinence, saddle anesthesia, or known history of cancer. ROS positive for dysuria, constipation, and insomnia. Patient taking Ambien for past 2 months and sleeping and new bed. Associated symptoms: no abdominal pain, no chest pain, no congestion, no diarrhea, no fever, no headaches, no loss of consciousness, no nausea, no shortness of breath and no vomiting     Past Medical History  Diagnosis Date  . Myocardial infarction   . Diabetes mellitus   . GERD (gastroesophageal reflux disease)   . Bell palsy   . Stroke   . Hypertension   . Diabetes mellitus without complication    Past Surgical History  Procedure Laterality Date  . Stents    . Cardiac surgery     Family History  Problem Relation Age of Onset  . Heart disease Other    Social History  Substance Use Topics  . Smoking status: Never Smoker   . Smokeless tobacco: None  . Alcohol Use: Yes     Comment: 2/week, whiskey with Ginger-ale    Review of Systems  Constitutional: Positive for chills. Negative for fever, diaphoresis and appetite change.  HENT: Negative for congestion.   Respiratory: Negative for shortness of breath.    Cardiovascular: Negative for chest pain.  Gastrointestinal: Positive for constipation. Negative for nausea, vomiting, abdominal pain and diarrhea.  Genitourinary: Positive for dysuria.  Neurological: Negative for loss of consciousness and headaches.  All other systems reviewed and are negative.   Allergies  Review of patient's allergies indicates no known allergies.  Home Medications   Prior to Admission medications   Medication Sig Start Date End Date Taking? Authorizing Provider  aspirin EC 81 MG tablet Take 81 mg by mouth daily.   Yes Historical Provider, MD  Choline Fenofibrate (TRILIPIX) 135 MG capsule Take 135 mg by mouth daily.   Yes Historical Provider, MD  HYDROcodone-acetaminophen (NORCO) 7.5-325 MG per tablet Take 1 tablet by mouth 2 (two) times daily. 02/04/15  Yes Historical Provider, MD  ibuprofen (ADVIL,MOTRIN) 200 MG tablet Take 600 mg by mouth every 2 (two) hours as needed (pain).   Yes Historical Provider, MD  lisinopril (PRINIVIL,ZESTRIL) 10 MG tablet Take 10 mg by mouth daily. 02/01/15  Yes Historical Provider, MD  metFORMIN (GLUCOPHAGE) 500 MG tablet Take 500 mg by mouth 2 (two) times daily with a meal.   Yes Historical Provider, MD  zolpidem (AMBIEN) 10 MG tablet Take 10 mg by mouth at bedtime. 12/30/14  Yes Historical Provider, MD  Alogliptin-Pioglitazone (OSENI) 25-15 MG TABS Take 1 tablet by mouth daily.     Historical Provider, MD  amitriptyline (ELAVIL) 150 MG tablet Take 150 mg by mouth at bedtime.    Historical Provider, MD  chlordiazePOXIDE (  LIBRIUM) 10 MG capsule Take 10 mg by mouth 2 (two) times daily.    Historical Provider, MD  diazepam (VALIUM) 5 MG tablet Take 5 mg by mouth at bedtime.    Historical Provider, MD  lubiprostone (AMITIZA) 24 MCG capsule Take 24 mcg by mouth 2 (two) times daily with a meal.    Historical Provider, MD  meloxicam (MOBIC) 15 MG tablet Take 15 mg by mouth 2 (two) times daily.     Historical Provider, MD  tiZANidine (ZANAFLEX) 4 MG  tablet Take 1 tablet (4 mg total) by mouth at bedtime as needed for muscle spasms. 02/18/15   Mayer Camel, MD   BP 160/73 mmHg  Pulse 66  Temp(Src) 98.6 F (37 C) (Oral)  Resp 18  Ht 5\' 7"  (1.702 m)  Wt 218 lb (98.884 kg)  BMI 34.14 kg/m2  SpO2 97%   Physical Exam  Constitutional: He is oriented to person, place, and time. He appears well-developed and well-nourished. No distress.  HENT:  Head: Normocephalic and atraumatic.  Eyes: Conjunctivae are normal. Pupils are equal, round, and reactive to light.  Neck: Normal range of motion. Neck supple.  Cardiovascular: Normal rate, regular rhythm and intact distal pulses.   Pulmonary/Chest: Effort normal and breath sounds normal.  Abdominal: Soft. Bowel sounds are normal. He exhibits no distension. There is no tenderness. There is no rebound.  Musculoskeletal: Normal range of motion. He exhibits tenderness. He exhibits no edema.  TTP of bilateral paraspinal muscles  Neurological: He is alert and oriented to person, place, and time. He has normal reflexes. He displays normal reflexes. No cranial nerve deficit. He exhibits normal muscle tone. Coordination normal.  Skin: He is not diaphoretic.    ED Course  Procedures   Labs Review Labs Reviewed  CBC - Abnormal; Notable for the following:    Platelets 143 (*)    All other components within normal limits  BASIC METABOLIC PANEL - Abnormal; Notable for the following:    Glucose, Bld 120 (*)    GFR calc non Af Amer 60 (*)    All other components within normal limits  CBG MONITORING, ED - Abnormal; Notable for the following:    Glucose-Capillary 134 (*)    All other components within normal limits  URINALYSIS, ROUTINE W REFLEX MICROSCOPIC (NOT AT [facility])   Imaging Review No results found. I have personally reviewed and evaluated these images and lab results as part of my medical decision-making.   EKG Interpretation None      MDM  Mr. Lister is a 69 yo male w/ PMHx of HTN, DM,  CAD (previous MI), CVA, and GERD presenting with back pain. Lower back pain. Bilateral. Gradual onsets 2 days ago. Persistent without severe worsening. Denies current or previous injury or previous history of back surgery. Denies radiation, fever, chills, night sweats, hematochezia, hematuria, urinary or bowel incontinence, saddle anesthesia, or known history of cancer. ROS positive for dysuria, constipation, and insomnia. Patient taking Ambien for past 2 months and sleeping and new bed.  Exam above notable for elderly male lying in stretcher in no acute distress. Afebrile. Heart rate 60s to 80s. Mildly hypertensive with SBP's in 140s to 160s. Maintaining saturations in high 90s on room air. Abdomen benign. Mild tenderness to palpation of bilateral paraspinal lumbar region. No midline tenderness palpation of C/T/L-spine. Negative straight leg raise test bilaterally. Neuro exam nonfocal.  Point of care glucose 134. CBC and BMP unremarkable. UA negative for blood or infection or severe dehydration.  Pt has known history of low back pain, nerve root involvement. Negative for Hx of known CA, night sweats, sudden unexpected weight loss, fever - CA unlikely. Negative for chronic steroid use or osteoporosis or significant trauma - acute fracture unlikely. Negative for abdominal bruit or palpable pulsating mass, age <50 & non-smoker w/o Hx of HLD, vitals are WNL, pulses & BP equal bilaterally - AAA/dissection unlikely. Negative for persistent fever, no Hx if IVDU or previous back injections or immunodeficieny - abscess or discitis unlikely. Negative for saddle anesthesia or bowel/bladder incontinence, rectal tone normal, neuro exam reveals no localized deficits - cauda equina unlikely. Pt did start sleeping on a new mattress before onset of pain so muscle strain likely.  Given that pain onset after patient started sleeping on the new mattress and the physical exam above, patient's pain is most likely related to a  muscle strain. Patient are to taking Ambien at night and counseled about taking that in conjunction with a muscle relaxant.  Patient discharged home in stable condition with prescription for by mouth muscle relaxant. Strict ED return precautions discussed. Patient understands and agrees with plan has no further questions or concerns this time.  Pt care discussed with and followed by my attending, Dr. Domingo Madeira, MD Pager [phone]   Final diagnoses:  Bilateral low back pain without sciatica  Lumbar strain, initial encounter    Mayer Camel, MD 02/19/15 0045  Forde Dandy, MD 02/19/15 [phone]

## 2015-02-18 NOTE — ED Notes (Addendum)
Pt c/o back pain for past few days. Pt has recently been given ambien for inability to sleep and lisinopril for high blood pressure. Pt also c/o urinary problems of difficulty emptying bladder.

## 2015-02-18 NOTE — Discharge Instructions (Signed)

## 2015-05-24 DIAGNOSIS — R1030 Lower abdominal pain, unspecified: Secondary | ICD-10-CM | POA: Insufficient documentation

## 2017-05-11 DIAGNOSIS — Z951 Presence of aortocoronary bypass graft: Secondary | ICD-10-CM | POA: Insufficient documentation

## 2017-07-07 ENCOUNTER — Emergency Department (HOSPITAL_COMMUNITY)
Admission: EM | Admit: 2017-07-07 | Discharge: 2017-07-07 | Discharge status: Against Medical Advice | Payer: Medicare HMO

## 2017-07-07 ENCOUNTER — Other Ambulatory Visit: Payer: Self-pay

## 2017-07-07 NOTE — ED Notes (Signed)
Called x2

## 2017-07-07 NOTE — ED Notes (Signed)
Called X3

## 2017-07-07 NOTE — ED Notes (Signed)
Called pt no answer X1  

## 2017-07-07 NOTE — ED Notes (Signed)
X1 called no answer

## 2017-07-07 NOTE — ED Notes (Signed)
Called to triage, unable to locate at this time.

## 2017-07-08 ENCOUNTER — Emergency Department (HOSPITAL_COMMUNITY)
Admission: EM | Admit: 2017-07-08 | Discharge: 2017-07-08 | Disposition: A | Discharge status: Home / Self Care | Payer: Medicare HMO | Attending: Emergency Medicine | Admitting: Emergency Medicine

## 2017-07-08 ENCOUNTER — Other Ambulatory Visit: Payer: Self-pay

## 2017-07-08 ENCOUNTER — Encounter (HOSPITAL_COMMUNITY): Payer: Self-pay

## 2017-07-08 DIAGNOSIS — E119 Type 2 diabetes mellitus without complications: Secondary | ICD-10-CM | POA: Diagnosis not present

## 2017-07-08 DIAGNOSIS — Z79899 Other long term (current) drug therapy: Secondary | ICD-10-CM | POA: Insufficient documentation

## 2017-07-08 DIAGNOSIS — I10 Essential (primary) hypertension: Secondary | ICD-10-CM | POA: Insufficient documentation

## 2017-07-08 DIAGNOSIS — K644 Residual hemorrhoidal skin tags: Secondary | ICD-10-CM | POA: Insufficient documentation

## 2017-07-08 DIAGNOSIS — K6289 Other specified diseases of anus and rectum: Secondary | ICD-10-CM | POA: Diagnosis present

## 2017-07-08 DIAGNOSIS — Z7982 Long term (current) use of aspirin: Secondary | ICD-10-CM | POA: Diagnosis not present

## 2017-07-08 DIAGNOSIS — Z794 Long term (current) use of insulin: Secondary | ICD-10-CM | POA: Insufficient documentation

## 2017-07-08 LAB — COMPREHENSIVE METABOLIC PANEL
ALT: 46 U/L (ref 17–63)
AST: 46 U/L — ABNORMAL HIGH (ref 15–41)
Albumin: 3.8 g/dL (ref 3.5–5.0)
Alkaline Phosphatase: 88 U/L (ref 38–126)
Anion gap: 12 (ref 5–15)
BUN: 30 mg/dL — ABNORMAL HIGH (ref 6–20)
CO2: 19 mmol/L — ABNORMAL LOW (ref 22–32)
Calcium: 9.6 mg/dL (ref 8.9–10.3)
Chloride: 107 mmol/L (ref 101–111)
Creatinine, Ser: 2.02 mg/dL — ABNORMAL HIGH (ref 0.61–1.24)
GFR calc Af Amer: 37 mL/min — ABNORMAL LOW (ref >60)
GFR calc non Af Amer: 32 mL/min — ABNORMAL LOW (ref >60)
Glucose, Bld: 154 mg/dL — ABNORMAL HIGH (ref 65–99)
Potassium: 4.9 mmol/L (ref 3.5–5.1)
Sodium: 138 mmol/L (ref 135–145)
Total Bilirubin: 0.9 mg/dL (ref 0.3–1.2)
Total Protein: 7.5 g/dL (ref 6.5–8.1)

## 2017-07-08 LAB — CBC WITH DIFFERENTIAL/PLATELET
Basophils Absolute: 0 10*3/uL (ref 0.0–0.1)
Basophils Relative: 0 %
Eosinophils Absolute: 0.1 10*3/uL (ref 0.0–0.7)
Eosinophils Relative: 1 %
HCT: 43.2 % (ref 39.0–52.0)
Hemoglobin: 14.8 g/dL (ref 13.0–17.0)
Lymphocytes Relative: 29 %
Lymphs Abs: 3.4 10*3/uL (ref 0.7–4.0)
MCH: 28.7 pg (ref 26.0–34.0)
MCHC: 34.3 g/dL (ref 30.0–36.0)
MCV: 83.7 fL (ref 78.0–100.0)
Monocytes Absolute: 1 10*3/uL (ref 0.1–1.0)
Monocytes Relative: 8 %
Neutro Abs: 7.3 10*3/uL (ref 1.7–7.7)
Neutrophils Relative %: 62 %
Platelets: 195 10*3/uL (ref 150–400)
RBC: 5.16 MIL/uL (ref 4.22–5.81)
RDW: 14 % (ref 11.5–15.5)
WBC: 11.8 10*3/uL — ABNORMAL HIGH (ref 4.0–10.5)

## 2017-07-08 LAB — CBG MONITORING, ED: Glucose-Capillary: 138 mg/dL — ABNORMAL HIGH (ref 65–99)

## 2017-07-08 MED ORDER — HYDROCORTISONE ACETATE 25 MG RE SUPP
25.0000 mg | Freq: Once | RECTAL | Status: AC
  Administered 2017-07-08: 25 mg via RECTAL
  Filled 2017-07-08: qty 1

## 2017-07-08 MED ORDER — HYDROCORTISONE ACETATE 25 MG RE SUPP: 25.0000 mg | Freq: Two times a day (BID) | RECTAL | 0 refills | Status: DC

## 2017-07-08 NOTE — ED Notes (Signed)
ED Provider at bedside. 

## 2017-07-08 NOTE — ED Notes (Signed)
Pt reports taking ~12 200mg  Advil today

## 2017-07-08 NOTE — ED Triage Notes (Signed)
Pt endorses hemorrhoid pain x 3 days and also states "I am diabetic and haven't had my insulin in 2-3 days because my mail order hasn't come in" Pt ambulatory. VSS.

## 2017-07-08 NOTE — ED Provider Notes (Signed)
[facility] EMERGENCY DEPARTMENT Provider Note   CSN: [id number] Arrival date & time: 07/08/17  1123     History   Chief Complaint Chief Complaint  Patient presents with  . Hemorrhoids    HPI David Burton is a 72 y.o. male.  HPI For rectal pain. Patient notes that he has had hemorrhoids in the past, has had relief with Preparation H, but has not had episodes in some time. Now, over the past 3 days, without a clear precipitant he has had rectal pain. No bleeding, no diarrhea, no abdominal pain, no fever, chills. No medication taken for pain relief.  He is a former Administrator.  Past Medical History:  Diagnosis Date  . Bell palsy   . Diabetes mellitus   . Diabetes mellitus without complication ([place])   . GERD (gastroesophageal reflux disease)   . Hypertension   . Myocardial infarction ([place])   . Stroke [facility])     There are no active problems to display for this patient.   Past Surgical History:  Procedure Laterality Date  . CARDIAC SURGERY    . stents         Home Medications    Prior to Admission medications   Medication Sig Start Date End Date Taking? Authorizing Provider  aspirin EC 81 MG tablet Take 81 mg by mouth daily.   Yes [provider]  atorvastatin (LIPITOR) 40 MG tablet Take 40 mg by mouth daily. 06/13/17  Yes [provider]  HYDROcodone-acetaminophen (NORCO) 10-325 MG tablet Take 1 tablet by mouth 3 (three) times daily.  02/04/15  Yes [provider]  ibuprofen (ADVIL,MOTRIN) 200 MG tablet Take 800 mg by mouth every 2 (two) hours as needed (pain).    Yes [provider]  insulin aspart (NOVOLOG) 100 UNIT/ML FlexPen Inject 10 Units into the skin daily. 06/10/17  Yes [provider]  insulin detemir (LEVEMIR) 100 UNIT/ML injection Inject 15 Units into the skin daily. 06/10/17  Yes [provider]  irbesartan (AVAPRO) 300 MG tablet Take 300 mg by mouth daily. 07/01/17  Yes [provider]  metoprolol tartrate (LOPRESSOR) 50 MG tablet Take 50 mg by mouth daily. 06/13/17  Yes [provider]  zolpidem (AMBIEN) 10 MG tablet Take 10 mg by mouth every other day.  12/30/14  Yes [provider]  tiZANidine (ZANAFLEX) 4 MG tablet Take 1 tablet (4 mg total) by mouth at bedtime as needed for muscle spasms. Patient not taking: Reported on 07/08/2017 02/18/15   Mayer Camel, MD    Family History Family History  Problem Relation Age of Onset  . Heart disease Other     Social History Social History   Tobacco Use  . Smoking status: Never Smoker  Substance Use Topics  . Alcohol use: Yes    Comment: 2/week, whiskey with Ginger[place]ale  . Drug use: No     Allergies   Patient has no known allergies.   Review of Systems Review of Systems  Constitutional:       Per HPI, otherwise negative  HENT:       Per HPI, otherwise negative  Respiratory:       Per HPI, otherwise negative  Cardiovascular:       Per HPI, otherwise negative  Gastrointestinal: Negative for vomiting.  Endocrine:       Negative aside from HPI  Genitourinary:       Neg aside from HPI   Musculoskeletal:  Per HPI, otherwise negative  Skin: Negative.   Neurological: Negative for syncope and weakness.     Physical Exam Updated Vital Signs BP 122/67 (BP Location: Right Arm)   Pulse 93   Temp 98.9 F (37.2 C) (Oral)   Resp 16   Ht 5\' 7"  (1.702 m)   Wt 108.9 kg (240 lb)   SpO2 98%   BMI 37.59 kg/m   Physical Exam  Constitutional: He is oriented to person, place, and time. He appears well-developed. No distress.  HENT:  Head: Normocephalic and atraumatic.  Eyes: Conjunctivae and EOM are normal.  Cardiovascular: Normal rate and regular rhythm.  Pulmonary/Chest: Effort normal. No stridor. No respiratory distress.  Abdominal: He exhibits no distension.  Genitourinary: Rectal exam shows external hemorrhoid. Rectal exam shows no fissure.  Musculoskeletal: He  exhibits no edema.  Neurological: He is alert and oriented to person, place, and time.  Skin: Skin is warm and dry.  Psychiatric: He has a normal mood and affect.  Nursing note and vitals reviewed.    ED Treatments / Results  Labs (all labs ordered are listed, but only abnormal results are displayed) Labs Reviewed  COMPREHENSIVE METABOLIC PANEL - Abnormal; Notable for the following components:      Result Value   CO2 19 (*)    Glucose, Bld 154 (*)    BUN 30 (*)    Creatinine, Ser 2.02 (*)    AST 46 (*)    GFR calc non Af Amer 32 (*)    GFR calc Af Amer 37 (*)    All other components within normal limits  CBC WITH DIFFERENTIAL/PLATELET - Abnormal; Notable for the following components:   WBC 11.8 (*)    All other components within normal limits  CBG MONITORING, ED - Abnormal; Notable for the following components:   Glucose-Capillary 138 (*)    All other components within normal limits     Procedures Procedures (including critical care time)  Medications Ordered in ED Medications  hydrocortisone (ANUSOL-HC) suppository 25 mg (25 mg Rectal Given 07/08/17 2205)     Initial Impression / Assessment and Plan / ED Course  I have reviewed the triage vital signs and the nursing notes.  Pertinent labs & imaging results that were available during my care of the patient were reviewed by me and considered in my medical decision making (see chart for details).  Well-appearing male presents with rectal pain, is found to have external hemorrhoid, nonthrombosed, nonbleeding. No other focal complaints, patient is hemodynamically unremarkable, without substantial hyperglycemia. Patient received initial suppository here, was discharged with ongoing therapy, and outpatient general surgery follow-up as needed.  Final Clinical Impressions(s) / ED Diagnoses  External hemorrhoid   Carmin Muskrat, MD 07/08/17 2208

## 2017-07-11 ENCOUNTER — Emergency Department (HOSPITAL_COMMUNITY)
Admission: EM | Admit: 2017-07-11 | Discharge: 2017-07-11 | Disposition: A | Discharge status: Home / Self Care | Payer: Medicare HMO | Attending: Emergency Medicine | Admitting: Emergency Medicine

## 2017-07-11 ENCOUNTER — Other Ambulatory Visit: Payer: Self-pay

## 2017-07-11 ENCOUNTER — Encounter (HOSPITAL_COMMUNITY): Payer: Self-pay

## 2017-07-11 DIAGNOSIS — Z5321 Procedure and treatment not carried out due to patient leaving prior to being seen by health care provider: Secondary | ICD-10-CM | POA: Insufficient documentation

## 2017-07-11 DIAGNOSIS — R531 Weakness: Secondary | ICD-10-CM | POA: Insufficient documentation

## 2017-07-11 LAB — CBC
HCT: 42.7 % (ref 39.0–52.0)
Hemoglobin: 14.4 g/dL (ref 13.0–17.0)
MCH: 28.5 pg (ref 26.0–34.0)
MCHC: 33.7 g/dL (ref 30.0–36.0)
MCV: 84.4 fL (ref 78.0–100.0)
Platelets: 191 10*3/uL (ref 150–400)
RBC: 5.06 MIL/uL (ref 4.22–5.81)
RDW: 14.6 % (ref 11.5–15.5)
WBC: 11.7 10*3/uL — ABNORMAL HIGH (ref 4.0–10.5)

## 2017-07-11 LAB — BASIC METABOLIC PANEL
Anion gap: 12 (ref 5–15)
BUN: 29 mg/dL — ABNORMAL HIGH (ref 6–20)
CO2: 19 mmol/L — ABNORMAL LOW (ref 22–32)
Calcium: 9.5 mg/dL (ref 8.9–10.3)
Chloride: 109 mmol/L (ref 101–111)
Creatinine, Ser: 1.83 mg/dL — ABNORMAL HIGH (ref 0.61–1.24)
GFR calc Af Amer: 41 mL/min — ABNORMAL LOW (ref >60)
GFR calc non Af Amer: 36 mL/min — ABNORMAL LOW (ref >60)
Glucose, Bld: 76 mg/dL (ref 65–99)
Potassium: 3.7 mmol/L (ref 3.5–5.1)
Sodium: 140 mmol/L (ref 135–145)

## 2017-07-11 NOTE — ED Triage Notes (Signed)
Pt states he has had increased weakness X3 weeks after CABG. Pt states he was walking 101mi/day prior to the surgery. Pt alert and oriented, no distress noted.

## 2017-07-11 NOTE — ED Notes (Signed)
Called for VS. No Answer

## 2017-07-11 NOTE — ED Notes (Signed)
Called for VS. No answer

## 2017-07-11 NOTE — ED Notes (Signed)
Called and patient is not answering

## 2017-12-26 ENCOUNTER — Other Ambulatory Visit: Payer: Self-pay | Admitting: Gastroenterology

## 2017-12-31 ENCOUNTER — Encounter (HOSPITAL_COMMUNITY): Payer: Self-pay

## 2018-01-02 ENCOUNTER — Other Ambulatory Visit: Payer: Self-pay

## 2018-01-02 ENCOUNTER — Ambulatory Visit (HOSPITAL_COMMUNITY): Payer: Medicare HMO | Admitting: Anesthesiology

## 2018-01-02 ENCOUNTER — Ambulatory Visit (HOSPITAL_COMMUNITY)
Admission: RE | Admit: 2018-01-02 | Discharge: 2018-01-02 | Disposition: A | Discharge status: Home / Self Care | Payer: Medicare HMO | Source: Ambulatory Visit | Attending: Gastroenterology | Admitting: Gastroenterology

## 2018-01-02 ENCOUNTER — Encounter (HOSPITAL_COMMUNITY)
Admission: RE | Disposition: A | Discharge status: Home / Self Care | Payer: Self-pay | Source: Ambulatory Visit | Attending: Gastroenterology

## 2018-01-02 ENCOUNTER — Encounter (HOSPITAL_COMMUNITY): Payer: Self-pay | Admitting: *Deleted

## 2018-01-02 DIAGNOSIS — Z7984 Long term (current) use of oral hypoglycemic drugs: Secondary | ICD-10-CM | POA: Diagnosis not present

## 2018-01-02 DIAGNOSIS — Z86010 Personal history of colonic polyps: Secondary | ICD-10-CM | POA: Insufficient documentation

## 2018-01-02 DIAGNOSIS — K219 Gastro-esophageal reflux disease without esophagitis: Secondary | ICD-10-CM | POA: Insufficient documentation

## 2018-01-02 DIAGNOSIS — Z8673 Personal history of transient ischemic attack (TIA), and cerebral infarction without residual deficits: Secondary | ICD-10-CM | POA: Diagnosis not present

## 2018-01-02 DIAGNOSIS — E119 Type 2 diabetes mellitus without complications: Secondary | ICD-10-CM | POA: Insufficient documentation

## 2018-01-02 DIAGNOSIS — I10 Essential (primary) hypertension: Secondary | ICD-10-CM | POA: Insufficient documentation

## 2018-01-02 DIAGNOSIS — Z79899 Other long term (current) drug therapy: Secondary | ICD-10-CM | POA: Insufficient documentation

## 2018-01-02 DIAGNOSIS — D175 Benign lipomatous neoplasm of intra-abdominal organs: Secondary | ICD-10-CM | POA: Insufficient documentation

## 2018-01-02 DIAGNOSIS — Z1211 Encounter for screening for malignant neoplasm of colon: Secondary | ICD-10-CM | POA: Insufficient documentation

## 2018-01-02 DIAGNOSIS — D120 Benign neoplasm of cecum: Secondary | ICD-10-CM | POA: Diagnosis not present

## 2018-01-02 DIAGNOSIS — I252 Old myocardial infarction: Secondary | ICD-10-CM | POA: Insufficient documentation

## 2018-01-02 DIAGNOSIS — D122 Benign neoplasm of ascending colon: Secondary | ICD-10-CM | POA: Insufficient documentation

## 2018-01-02 HISTORY — PX: POLYPECTOMY: SHX5525

## 2018-01-02 HISTORY — PX: COLONOSCOPY WITH PROPOFOL: SHX5780

## 2018-01-02 LAB — GLUCOSE, CAPILLARY: Glucose-Capillary: 152 mg/dL — ABNORMAL HIGH (ref 70–99)

## 2018-01-02 SURGERY — COLONOSCOPY WITH PROPOFOL
Anesthesia: Monitor Anesthesia Care

## 2018-01-02 MED ORDER — PROPOFOL 500 MG/50ML IV EMUL
INTRAVENOUS | Status: DC | PRN
  Administered 2018-01-02: 125 ug/kg/min via INTRAVENOUS

## 2018-01-02 MED ORDER — PROPOFOL 10 MG/ML IV BOLUS
INTRAVENOUS | Status: AC
  Filled 2018-01-02: qty 40

## 2018-01-02 MED ORDER — LIDOCAINE 2% (20 MG/ML) 5 ML SYRINGE
INTRAMUSCULAR | Status: DC | PRN
  Administered 2018-01-02: 100 mg via INTRAVENOUS

## 2018-01-02 MED ORDER — LACTATED RINGERS IV SOLN
INTRAVENOUS | Status: DC | PRN
  Administered 2018-01-02: 09:00:00 via INTRAVENOUS

## 2018-01-02 MED ORDER — PROPOFOL 10 MG/ML IV BOLUS
INTRAVENOUS | Status: AC
  Filled 2018-01-02: qty 20

## 2018-01-02 MED ORDER — PROPOFOL 10 MG/ML IV BOLUS
INTRAVENOUS | Status: DC | PRN
  Administered 2018-01-02 (×3): 20 mg via INTRAVENOUS

## 2018-01-02 SURGICAL SUPPLY — 21 items

## 2018-01-02 NOTE — Anesthesia Procedure Notes (Signed)
Procedure Name: MAC Date/Time: 01/02/2018 8:36 AM Performed by: Dione Booze, CRNA Pre-anesthesia Checklist: Patient identified, Emergency Drugs available, Suction available and Patient being monitored Patient Re-evaluated:Patient Re-evaluated prior to induction Oxygen Delivery Method: Simple face mask Placement Confirmation: positive ETCO2

## 2018-01-02 NOTE — Op Note (Addendum)
[facility] Patient Name: David Burton Procedure Date: 01/02/2018 MRN: [id number] Attending MD: Carol Ada , MD Date of Birth: 09-25-1945 CSN: [id number] Age: 72 Admit Type: Outpatient Procedure:                Colonoscopy Indications:              High risk colon cancer surveillance: Personal                            history of colonic polyps Providers:                Carol Ada, MD, Baird Cancer, RN, Laurena Spies, Technician, Dione Booze, CRNA Referring MD:              Medicines:                Propofol per Anesthesia Complications:            No immediate complications. Estimated Blood Loss:     Estimated blood loss was minimal. Procedure:                Pre-Anesthesia Assessment:                           - Prior to the procedure, a History and Physical                            was performed, and patient medications and                            allergies were reviewed. The patient's tolerance of                            previous anesthesia was also reviewed. The risks                            and benefits of the procedure and the sedation                            options and risks were discussed with the patient.                            All questions were answered, and informed consent                            was obtained. Prior Anticoagulants: The patient has                            taken no previous anticoagulant or antiplatelet                            agents. ASA Grade Assessment: III - A patient with  severe systemic disease. After reviewing the risks                            and benefits, the patient was deemed in                            satisfactory condition to undergo the procedure.                           - Sedation was administered by an anesthesia                            professional. Deep sedation was attained.                           After obtaining  informed consent, the colonoscope                            was passed under direct vision. Throughout the                            procedure, the patient's blood pressure, pulse, and                            oxygen saturations were monitored continuously. The                            CF-HQ190L ([id number]) Olympus adult colonoscope was                            introduced through the anus and advanced to the the                            cecum, identified by appendiceal orifice and                            ileocecal valve. The colonoscopy was performed with                            difficulty due to significant looping. Successful                            completion of the procedure was aided by using                            manual pressure and straightening and shortening                            the scope to obtain bowel loop reduction. The                            patient tolerated the procedure well. The quality  of the bowel preparation was good. The ileocecal                            valve, appendiceal orifice, and rectum were                            photographed. Scope In: 8:44:49 AM Scope Out: 9:12:49 AM Scope Withdrawal Time: 0 hours 18 minutes 58 seconds  Total Procedure Duration: 0 hours 28 minutes 0 seconds  Findings:      Two sessile polyps were found in the ascending colon and cecum. The       polyps were 2 to 3 mm in size. These polyps were removed with a cold       snare. Resection and retrieval were complete.      Both the tech and nurse applied abdominal pressure as the colonoscope       was looping severely when trying to advance from the hepatic flexure to       the cecum. The combination of pressure and clockwise torquing of the       colonoscope allowed for cecal intuabtion.      There was a large lipoma, in the transverse colon. Impression:               - Two 2 to 3 mm polyps in the ascending colon and                             in the cecum, removed with a cold snare. Resected                            and retrieved.                           - Large lipoma in the transverse colon. Moderate Sedation:      N/A- Per Anesthesia Care Recommendation:           - Patient has a contact number available for                            emergencies. The signs and symptoms of potential                            delayed complications were discussed with the                            patient. Return to normal activities tomorrow.                            Written discharge instructions were provided to the                            patient.                           - Resume previous diet.                           - Continue  present medications.                           - Await pathology results.                           - Repeat colonoscopy in 5 years for surveillance. Procedure Code(s):        --- Professional ---                           [phone], Colonoscopy, flexible; with removal of                            tumor(s), polyp(s), or other lesion(s) by snare                            technique Diagnosis Code(s):        --- Professional ---                           D12.2, Benign neoplasm of ascending colon                           Z86.010, Personal history of colonic polyps                           D12.0, Benign neoplasm of cecum                           D17.5, Benign lipomatous neoplasm of                            intra-abdominal organs CPT copyright 2017 [employer]. All rights reserved. The codes documented in this report are preliminary and upon coder review may  be revised to meet current compliance requirements. Carol Ada, MD Carol Ada, MD 01/02/2018 9:19:07 AM This report has been signed electronically. Number of Addenda: 0

## 2018-01-02 NOTE — Anesthesia Postprocedure Evaluation (Signed)
Anesthesia Post Note  Patient: David Burton  Procedure(s) Performed: COLONOSCOPY WITH PROPOFOL (N/A ) POLYPECTOMY BIOPSY     Patient location during evaluation: PACU Anesthesia Type: MAC Level of consciousness: awake and alert and oriented Pain management: pain level controlled Vital Signs Assessment: post-procedure vital signs reviewed and stable Respiratory status: spontaneous breathing, nonlabored ventilation and respiratory function stable Cardiovascular status: stable and blood pressure returned to baseline Postop Assessment: no apparent nausea or vomiting Anesthetic complications: no    Last Vitals:  Vitals:   01/02/18 0950 01/02/18 1000  BP: (!) 184/86 (!) 160/90  Pulse:  69  Resp: 16 14  Temp:    SpO2:  100%    Last Pain:  Vitals:   01/02/18 1000  TempSrc:   PainSc: 0-No pain                 [name] A.

## 2018-01-02 NOTE — Transfer of Care (Signed)
Immediate Anesthesia Transfer of Care Note  Patient: David Burton  Procedure(s) Performed: COLONOSCOPY WITH PROPOFOL (N/A ) POLYPECTOMY BIOPSY  Patient Location: PACU and Endoscopy Unit  Anesthesia Type:MAC  Level of Consciousness: awake, alert  and patient cooperative  Airway & Oxygen Therapy: Patient Spontanous Breathing and Patient connected to face mask oxygen  Post-op Assessment: Report given to RN and Post -op Vital signs reviewed and stable  Post vital signs: Reviewed and stable  Last Vitals:  Vitals Value Taken Time  BP    Temp    Pulse    Resp    SpO2      Last Pain:  Vitals:   01/02/18 0755  TempSrc: Oral  PainSc: 0-No pain         Complications: No apparent anesthesia complications

## 2018-01-02 NOTE — Anesthesia Preprocedure Evaluation (Addendum)
Anesthesia Evaluation  Patient identified by MRN, date of birth, ID band Patient awake    Reviewed: Allergy & Precautions, NPO status , Patient's Chart, lab work & pertinent test results, reviewed documented beta blocker date and time   Airway Mallampati: II  TM Distance: >3 FB Neck ROM: Full    Dental  (+) Teeth Intact   Pulmonary neg pulmonary ROS,    Pulmonary exam normal breath sounds clear to auscultation       Cardiovascular hypertension, Pt. on medications and Pt. on home beta blockers + CAD, + Past MI and + CABG  Normal cardiovascular exam Rhythm:Regular Rate:Normal  CABG x4 05/2017 Non STEMI   Neuro/Psych Hx/o Bell's palsy  Neuromuscular disease CVA negative psych ROS   GI/Hepatic Neg liver ROS, GERD  Medicated and Controlled,For Screening colonoscopy   Endo/Other  diabetes, Well Controlled, Type 2, Insulin Dependent, Oral Hypoglycemic AgentsHyperlipidemia  Renal/GU Renal InsufficiencyRenal disease  negative genitourinary   Musculoskeletal negative musculoskeletal ROS (+)   Abdominal   Peds  Hematology negative hematology ROS (+)   Anesthesia Other Findings   Reproductive/Obstetrics                            Anesthesia Physical Anesthesia Plan  ASA: III  Anesthesia Plan: MAC   Post-op Pain Management:    Induction:   PONV Risk Score and Plan: 1 and Propofol infusion, Ondansetron and Treatment may vary due to age or medical condition  Airway Management Planned: Natural Airway and Nasal Cannula  Additional Equipment:   Intra-op Plan:   Post-operative Plan:   Informed Consent: I have reviewed the patients History and Physical, chart, labs and discussed the procedure including the risks, benefits and alternatives for the proposed anesthesia with the patient or authorized representative who has indicated his/her understanding and acceptance.   Dental advisory  given  Plan Discussed with: Surgeon and CRNA  Anesthesia Plan Comments:         Anesthesia Quick Evaluation

## 2018-01-02 NOTE — H&P (Signed)
  David Burton HPI: At this time the patient denies any problems with nausea, vomiting, fevers, chills, abdominal pain, diarrhea, hematochezia, melena, GERD, or dysphagia. The patient denies any known family history of colon cancers. No complaints of chest pain, SOB, MI, or sleep apnea. His routine colonoscopy on 03/14/2011 was significant for a tubular adenoma and he was recommended a 5 year follow up. For the past 6 months he reports having constipation. He can go up to a week without having a bowel movement. Straining does swell up his hemorrhoids. The patient reports using enemas, which helped initially. Now he does not notice any improvement. The patient was provided with a "red syrup" and it caused him to have diarrhea. As a result he has not used any more laxatives. This past December he had a CABG, but he does not know how many vessels. The patient does see Dr. Einar Gip and his surgery was in [place], [place].  Past Medical History:  Diagnosis Date  . Bell palsy   . Diabetes mellitus   . Diabetes mellitus without complication ([place])   . GERD (gastroesophageal reflux disease)   . Hypertension   . Myocardial infarction ([place])   . Stroke [facility])     Past Surgical History:  Procedure Laterality Date  . CARDIAC SURGERY    . stents      Family History  Problem Relation Age of Onset  . Heart disease Other     Social History:  reports that he has never smoked. He has never used smokeless tobacco. He reports that he drinks alcohol. He reports that he does not use drugs.  Allergies: No Known Allergies  Medications: Scheduled: Continuous:  No results found for this or any previous visit (from the past 24 hour(s)).   No results found.  ROS:  As stated above in the HPI otherwise negative.  There were no vitals taken for this visit.    PE: Gen: NAD, Alert and Oriented HEENT:  [place]/AT, EOMI Neck: Supple, no LAD Lungs: CTA Bilaterally CV: RRR without M/G/R ABM: Soft, NTND, +BS Ext: No  C/C/E  Assessment/Plan: 1) Personal history of polyps.  David Burton D 01/02/2018, 7:55 AM

## 2018-02-28 ENCOUNTER — Encounter (HOSPITAL_COMMUNITY): Payer: Self-pay | Admitting: Emergency Medicine

## 2018-02-28 ENCOUNTER — Emergency Department (HOSPITAL_COMMUNITY): Payer: Medicare HMO

## 2018-02-28 ENCOUNTER — Other Ambulatory Visit: Payer: Self-pay

## 2018-02-28 ENCOUNTER — Emergency Department (HOSPITAL_COMMUNITY)
Admission: EM | Admit: 2018-02-28 | Discharge: 2018-02-28 | Disposition: A | Discharge status: Home / Self Care | Payer: Medicare HMO | Attending: Emergency Medicine | Admitting: Emergency Medicine

## 2018-02-28 DIAGNOSIS — Z794 Long term (current) use of insulin: Secondary | ICD-10-CM | POA: Insufficient documentation

## 2018-02-28 DIAGNOSIS — I10 Essential (primary) hypertension: Secondary | ICD-10-CM | POA: Diagnosis not present

## 2018-02-28 DIAGNOSIS — Z7982 Long term (current) use of aspirin: Secondary | ICD-10-CM | POA: Diagnosis not present

## 2018-02-28 DIAGNOSIS — Z79899 Other long term (current) drug therapy: Secondary | ICD-10-CM | POA: Insufficient documentation

## 2018-02-28 DIAGNOSIS — Z951 Presence of aortocoronary bypass graft: Secondary | ICD-10-CM | POA: Diagnosis not present

## 2018-02-28 DIAGNOSIS — E119 Type 2 diabetes mellitus without complications: Secondary | ICD-10-CM | POA: Diagnosis not present

## 2018-02-28 DIAGNOSIS — R69 Illness, unspecified: Secondary | ICD-10-CM

## 2018-02-28 DIAGNOSIS — R079 Chest pain, unspecified: Secondary | ICD-10-CM | POA: Insufficient documentation

## 2018-02-28 LAB — CBC
HCT: 46.8 % (ref 39.0–52.0)
Hemoglobin: 15.5 g/dL (ref 13.0–17.0)
MCH: 30.5 pg (ref 26.0–34.0)
MCHC: 33.1 g/dL (ref 30.0–36.0)
MCV: 91.9 fL (ref 78.0–100.0)
Platelets: 123 10*3/uL — ABNORMAL LOW (ref 150–400)
RBC: 5.09 MIL/uL (ref 4.22–5.81)
RDW: 12.8 % (ref 11.5–15.5)
WBC: 9 10*3/uL (ref 4.0–10.5)

## 2018-02-28 LAB — BASIC METABOLIC PANEL
Anion gap: 8 (ref 5–15)
BUN: 20 mg/dL (ref 8–23)
CO2: 22 mmol/L (ref 22–32)
Calcium: 8.8 mg/dL — ABNORMAL LOW (ref 8.9–10.3)
Chloride: 106 mmol/L (ref 98–111)
Creatinine, Ser: 1.57 mg/dL — ABNORMAL HIGH (ref 0.61–1.24)
GFR calc Af Amer: 50 mL/min — ABNORMAL LOW (ref >60)
GFR calc non Af Amer: 43 mL/min — ABNORMAL LOW (ref >60)
Glucose, Bld: 123 mg/dL — ABNORMAL HIGH (ref 70–99)
Potassium: 4.2 mmol/L (ref 3.5–5.1)
Sodium: 136 mmol/L (ref 135–145)

## 2018-02-28 LAB — I-STAT TROPONIN, ED: Troponin i, poc: 0.01 ng/mL (ref 0.00–0.08)

## 2018-02-28 MED ORDER — OMEPRAZOLE 20 MG PO CPDR: 20.0000 mg | DELAYED_RELEASE_CAPSULE | Freq: Every day | ORAL | 0 refills | Status: DC

## 2018-02-28 NOTE — ED Triage Notes (Signed)
Patient to ED c/o central CP radiating down R arm onset 10pm last night, intermittent and "tight" in nature. Patient hx open heart-surgery 7 months ago and has had minor chest pains since, but not as bad as current episode. He took 2 baby ASA this morning and 1 sublingual NTG around 9:30am with some relief. He denies shortness of breath, dizziness, N/V. Resp e/u, skin warm/dry.

## 2018-02-28 NOTE — Discharge Instructions (Signed)
1.  Start taking Prilosec daily for the next 2 weeks. 2.  Call your cardiologist office on Monday to schedule a recheck this week. 3.  Return to the emergency department if your symptoms are worsening or you are developing new symptoms.

## 2018-02-28 NOTE — ED Provider Notes (Signed)
[place] EMERGENCY DEPARTMENT Provider Note   CSN: [id number] Arrival date & time: 02/28/18  1243     History   Chief Complaint Chief Complaint  Patient presents with  . Chest Pain    HPI David Burton is a 72 y.o. male.  HPI Patient developed right sided chest pain last night while sleeping.  He reports it seemed worse when he was lying on his right side.  It did improve when he lay on his back.  He states that it has however persisted and the slight aching quality slightly to the right of his sternum.  No other associated symptoms.  He reports he tried 1 of his nitroglycerin but it did not really change it at all.  He had coronary artery bypass graft surgery 6 months ago.  He was worried that there might be any blockages. Past Medical History:  Diagnosis Date  . Bell palsy   . Diabetes mellitus   . Diabetes mellitus without complication ([place])   . GERD (gastroesophageal reflux disease)   . Hypertension   . Myocardial infarction ([place])   . Stroke [facility])     There are no active problems to display for this patient.   Past Surgical History:  Procedure Laterality Date  . CARDIAC SURGERY    . COLONOSCOPY WITH PROPOFOL N/A 01/02/2018   Procedure: COLONOSCOPY WITH PROPOFOL;  Surgeon: Carol Ada, MD;  Location: [facility] ENDOSCOPY;  Service: Endoscopy;  Laterality: N/A;  . POLYPECTOMY  01/02/2018   Procedure: POLYPECTOMY;  Surgeon: Carol Ada, MD;  Location: [facility] ENDOSCOPY;  Service: Endoscopy;;  . stents          Home Medications    Prior to Admission medications   Medication Sig Start Date End Date Taking? Authorizing Provider  aspirin EC 81 MG tablet Take 81 mg by mouth daily.    [provider]  atorvastatin (LIPITOR) 40 MG tablet Take 40 mg by mouth daily. 06/13/17   [provider]  furosemide (LASIX) 20 MG tablet Take 20 mg by mouth daily.    [provider]  HYDROcodone-acetaminophen (NORCO) 10-325 MG tablet Take 1 tablet by  mouth 3 (three) times daily.  02/04/15   [provider]  ibuprofen (ADVIL,MOTRIN) 200 MG tablet Take 400 mg by mouth every 6 (six) hours as needed for headache or moderate pain.     [provider]  insulin aspart (NOVOLOG) 100 UNIT/ML FlexPen Inject 10 Units into the skin daily. 06/10/17   [provider]  insulin detemir (LEVEMIR) 100 UNIT/ML injection Inject 10 Units into the skin at bedtime.  06/10/17   [provider]  metFORMIN (GLUCOPHAGE) 500 MG tablet Take 500 mg by mouth daily with breakfast.    [provider]  metoprolol tartrate (LOPRESSOR) 50 MG tablet Take 50 mg by mouth daily. 06/13/17   [provider]  nitroGLYCERIN (NITROSTAT) 0.4 MG SL tablet Place 0.4 mg under the tongue every 5 (five) minutes as needed for chest pain.    [provider]  omeprazole (PRILOSEC) 20 MG capsule Take 1 capsule (20 mg total) by mouth daily. 02/28/18   Charlesetta Shanks, MD  testosterone cypionate (DEPOTESTOSTERONE CYPIONATE) 200 MG/ML injection Inject 200 mg into the muscle every 14 (fourteen) days. 12/10/17   [provider]  zolpidem (AMBIEN) 10 MG tablet Take 10 mg by mouth at bedtime.  12/30/14   [provider]    Family History Family History  Problem Relation Age of Onset  . Heart  disease Other     Social History Social History   Tobacco Use  . Smoking status: Never Smoker  . Smokeless tobacco: Never Used  Substance Use Topics  . Alcohol use: Yes    Comment: 2/week, whiskey with Ginger-ale  . Drug use: No     Allergies   Patient has no known allergies.   Review of Systems Review of Systems 10 Systems reviewed and are negative for acute change except as noted in the HPI.   Physical Exam Updated Vital Signs BP (!) 153/52 (BP Location: Right Arm)   Pulse (!) 59   Temp 98.5 F (36.9 C) (Oral)   Resp 20   Ht 5\' 6"  (1.676 m)   Wt 113.4 kg   SpO2 99%   BMI 40.35 kg/m   Physical Exam    Constitutional: He is oriented to person, place, and time.  Patient is alert and nontoxic.  Clinically well in appearance.  No respiratory distress.  Obesity.  HENT:  Patient has facial droop consistent with pre-existing Bell's palsy.  Cardiovascular: Normal rate, regular rhythm, normal heart sounds and intact distal pulses.  Pulmonary/Chest: Effort normal and breath sounds normal.  Patient has well-healed sternotomy scar.  Mild tenderness to the right chest wall.  Abdominal: Soft. He exhibits no distension. There is no tenderness. There is no guarding.  Musculoskeletal: Normal range of motion. He exhibits no edema or tenderness.  No peripheral edema.  Calves are soft and nontender.  Skin condition of the lower extremities very good.  Neurological: He is alert and oriented to person, place, and time. He exhibits normal muscle tone.  Skin: Skin is warm and dry.  Psychiatric: He has a normal mood and affect.     ED Treatments / Results  Labs (all labs ordered are listed, but only abnormal results are displayed) Labs Reviewed  BASIC METABOLIC PANEL - Abnormal; Notable for the following components:      Result Value   Glucose, Bld 123 (*)    Creatinine, Ser 1.57 (*)    Calcium 8.8 (*)    GFR calc non Af Amer 43 (*)    GFR calc Af Amer 50 (*)    All other components within normal limits  CBC - Abnormal; Notable for the following components:   Platelets 123 (*)    All other components within normal limits  I-STAT TROPONIN, ED    EKG EKG Interpretation  Date/Time:  Saturday February 28 2018 12:46:25 EDT Ventricular Rate:  57 PR Interval:  158 QRS Duration: 110 QT Interval:  422 QTC Calculation: 410 R Axis:   -50 Text Interpretation:  Sinus bradycardia Incomplete right bundle branch block Left anterior fascicular block Inferior infarct , age undetermined Anterior infarct , age undetermined Abnormal ECG no acute ischemic change, no sig change from previous Confirmed by Charlesetta Shanks [phone]) on 02/28/2018 2:10:02 PM   Radiology Dg Chest 2 View  Result Date: 02/28/2018 CLINICAL DATA:  Patient to ED c/o central CP radiating down R arm onset 10pm last night, intermittent and "tight" in nature. Patient hx open heart-surgery 7 months ago and has had minor chest pains since, but not as bad as current episode. He to.*comment was truncated* EXAM: CHEST - 2 VIEW COMPARISON:  12/03/2012 FINDINGS: Sternotomy wires overlie normal cardiac silhouette. Normal pulmonary vasculature. No effusion, infiltrate, or pneumothorax. No acute osseous abnormality. IMPRESSION: No acute cardiopulmonary process. Electronically Signed   By: Suzy Bouchard M.D.   On: 02/28/2018 14:26    Procedures  Procedures (including critical care time)  Medications Ordered in ED Medications - No data to display   Initial Impression / Assessment and Plan / ED Course  I have reviewed the triage vital signs and the nursing notes.  Pertinent labs & imaging results that were available during my care of the patient were reviewed by me and considered in my medical decision making (see chart for details).      Final Clinical Impressions(s) / ED Diagnoses   Final diagnoses:  Nonspecific chest pain  Severe comorbid illness   Patient has history of coronary artery bypass graft surgery about 6 months ago.  He had chest pain on the right side of his chest last night which got better when he moved off of his right side and lay flat on his back.  Troponin is negative and EKG does not show any ischemic changes.  Pain has lingered as a dull aching quality.  At this time I have low suspicion for cardiac ischemic disease as etiology.  I suspect possibly chest wall pain or reflux-like symptoms.  Patient used to be on reflux medications but has not been for some time.  Plan will be for the patient to take his hydrocodone 10 which he has available but has not used in a number of weeks.  And to initiate Prilosec for 2 weeks.   Return precautions are reviewed.  Patient will contact his cardiologist Monday to schedule expeditious follow-up. ED Discharge Orders         Ordered    omeprazole (PRILOSEC) 20 MG capsule  Daily     02/28/18 1500           Charlesetta Shanks, MD 02/28/18 1505

## 2018-06-16 ENCOUNTER — Emergency Department (HOSPITAL_COMMUNITY)
Admission: EM | Admit: 2018-06-16 | Discharge: 2018-06-16 | Disposition: A | Discharge status: Home / Self Care | Payer: Medicare HMO | Attending: Emergency Medicine | Admitting: Emergency Medicine

## 2018-06-16 ENCOUNTER — Other Ambulatory Visit: Payer: Self-pay

## 2018-06-16 ENCOUNTER — Emergency Department (HOSPITAL_COMMUNITY): Payer: Medicare HMO

## 2018-06-16 ENCOUNTER — Encounter (HOSPITAL_COMMUNITY): Payer: Self-pay | Admitting: Emergency Medicine

## 2018-06-16 DIAGNOSIS — E119 Type 2 diabetes mellitus without complications: Secondary | ICD-10-CM | POA: Insufficient documentation

## 2018-06-16 DIAGNOSIS — I252 Old myocardial infarction: Secondary | ICD-10-CM | POA: Diagnosis not present

## 2018-06-16 DIAGNOSIS — K2900 Acute gastritis without bleeding: Secondary | ICD-10-CM | POA: Diagnosis not present

## 2018-06-16 DIAGNOSIS — R1012 Left upper quadrant pain: Secondary | ICD-10-CM | POA: Diagnosis present

## 2018-06-16 DIAGNOSIS — Z794 Long term (current) use of insulin: Secondary | ICD-10-CM | POA: Insufficient documentation

## 2018-06-16 DIAGNOSIS — Z8673 Personal history of transient ischemic attack (TIA), and cerebral infarction without residual deficits: Secondary | ICD-10-CM | POA: Insufficient documentation

## 2018-06-16 DIAGNOSIS — Z7982 Long term (current) use of aspirin: Secondary | ICD-10-CM | POA: Insufficient documentation

## 2018-06-16 DIAGNOSIS — I10 Essential (primary) hypertension: Secondary | ICD-10-CM | POA: Insufficient documentation

## 2018-06-16 DIAGNOSIS — Z79899 Other long term (current) drug therapy: Secondary | ICD-10-CM | POA: Diagnosis not present

## 2018-06-16 LAB — LIPASE, BLOOD: Lipase: 41 U/L (ref 11–51)

## 2018-06-16 LAB — COMPREHENSIVE METABOLIC PANEL
ALT: 23 U/L (ref 0–44)
AST: 23 U/L (ref 15–41)
Albumin: 3.1 g/dL — ABNORMAL LOW (ref 3.5–5.0)
Alkaline Phosphatase: 48 U/L (ref 38–126)
Anion gap: 8 (ref 5–15)
BUN: 21 mg/dL (ref 8–23)
CO2: 21 mmol/L — ABNORMAL LOW (ref 22–32)
Calcium: 8.8 mg/dL — ABNORMAL LOW (ref 8.9–10.3)
Chloride: 113 mmol/L — ABNORMAL HIGH (ref 98–111)
Creatinine, Ser: 1.65 mg/dL — ABNORMAL HIGH (ref 0.61–1.24)
GFR calc Af Amer: 48 mL/min — ABNORMAL LOW (ref >60)
GFR calc non Af Amer: 41 mL/min — ABNORMAL LOW (ref >60)
Glucose, Bld: 120 mg/dL — ABNORMAL HIGH (ref 70–99)
Potassium: 4.2 mmol/L (ref 3.5–5.1)
Sodium: 142 mmol/L (ref 135–145)
Total Bilirubin: 0.7 mg/dL (ref 0.3–1.2)
Total Protein: 6.6 g/dL (ref 6.5–8.1)

## 2018-06-16 LAB — URINALYSIS, ROUTINE W REFLEX MICROSCOPIC
Bilirubin Urine: NEGATIVE
Glucose, UA: NEGATIVE mg/dL
Ketones, ur: NEGATIVE mg/dL
Nitrite: NEGATIVE
Protein, ur: NEGATIVE mg/dL
Specific Gravity, Urine: 1.008 (ref 1.005–1.030)
pH: 5 (ref 5.0–8.0)

## 2018-06-16 LAB — CBC WITH DIFFERENTIAL/PLATELET
Abs Immature Granulocytes: 0.09 10*3/uL — ABNORMAL HIGH (ref 0.00–0.07)
Basophils Absolute: 0 10*3/uL (ref 0.0–0.1)
Basophils Relative: 0 %
Eosinophils Absolute: 0 10*3/uL (ref 0.0–0.5)
Eosinophils Relative: 0 %
HCT: 46 % (ref 39.0–52.0)
Hemoglobin: 15.3 g/dL (ref 13.0–17.0)
Immature Granulocytes: 1 %
Lymphocytes Relative: 24 %
Lymphs Abs: 2.9 10*3/uL (ref 0.7–4.0)
MCH: 30.8 pg (ref 26.0–34.0)
MCHC: 33.3 g/dL (ref 30.0–36.0)
MCV: 92.6 fL (ref 80.0–100.0)
Monocytes Absolute: 1.2 10*3/uL — ABNORMAL HIGH (ref 0.1–1.0)
Monocytes Relative: 10 %
Neutro Abs: 8 10*3/uL — ABNORMAL HIGH (ref 1.7–7.7)
Neutrophils Relative %: 65 %
Platelets: 125 10*3/uL — ABNORMAL LOW (ref 150–400)
RBC: 4.97 MIL/uL (ref 4.22–5.81)
RDW: 15.1 % (ref 11.5–15.5)
WBC: 12.2 10*3/uL — ABNORMAL HIGH (ref 4.0–10.5)
nRBC: 0 % (ref 0.0–0.2)

## 2018-06-16 LAB — CBG MONITORING, ED: Glucose-Capillary: 117 mg/dL — ABNORMAL HIGH (ref 70–99)

## 2018-06-16 MED ORDER — IOPAMIDOL (ISOVUE-370) INJECTION 76%
INTRAVENOUS | Status: AC
  Administered 2018-06-16: 100 mL
  Filled 2018-06-16: qty 100

## 2018-06-16 MED ORDER — LIDOCAINE VISCOUS HCL 2 % MT SOLN
15.0000 mL | Freq: Once | OROMUCOSAL | Status: AC
  Administered 2018-06-16: 15 mL via ORAL
  Filled 2018-06-16: qty 15

## 2018-06-16 MED ORDER — FAMOTIDINE IN NACL 20-0.9 MG/50ML-% IV SOLN
20.0000 mg | Freq: Once | INTRAVENOUS | Status: AC
  Administered 2018-06-16: 20 mg via INTRAVENOUS
  Filled 2018-06-16: qty 50

## 2018-06-16 MED ORDER — MORPHINE SULFATE (PF) 4 MG/ML IV SOLN
4.0000 mg | Freq: Once | INTRAVENOUS | Status: AC
  Administered 2018-06-16: 4 mg via INTRAVENOUS
  Filled 2018-06-16: qty 1

## 2018-06-16 MED ORDER — SODIUM CHLORIDE 0.9 % IV BOLUS
1000.0000 mL | Freq: Once | INTRAVENOUS | Status: AC
  Administered 2018-06-16: 1000 mL via INTRAVENOUS

## 2018-06-16 MED ORDER — FENTANYL CITRATE (PF) 100 MCG/2ML IJ SOLN
100.0000 ug | Freq: Once | INTRAMUSCULAR | Status: AC
  Administered 2018-06-16: 100 ug via INTRAVENOUS
  Filled 2018-06-16: qty 2

## 2018-06-16 MED ORDER — ONDANSETRON HCL 4 MG/2ML IJ SOLN
4.0000 mg | Freq: Once | INTRAMUSCULAR | Status: AC
  Administered 2018-06-16: 4 mg via INTRAVENOUS
  Filled 2018-06-16: qty 2

## 2018-06-16 MED ORDER — METOPROLOL TARTRATE 5 MG/5ML IV SOLN
2.5000 mg | Freq: Once | INTRAVENOUS | Status: AC
  Administered 2018-06-16: 2.5 mg via INTRAVENOUS
  Filled 2018-06-16: qty 5

## 2018-06-16 MED ORDER — ALUM & MAG HYDROXIDE-SIMETH 200-200-20 MG/5ML PO SUSP
30.0000 mL | Freq: Once | ORAL | Status: AC
  Administered 2018-06-16: 30 mL via ORAL
  Filled 2018-06-16: qty 30

## 2018-06-16 MED ORDER — FAMOTIDINE 10 MG PO TABS: 10.0000 mg | ORAL_TABLET | Freq: Two times a day (BID) | ORAL | 0 refills | Status: DC

## 2018-06-16 MED ORDER — ONDANSETRON 4 MG PO TBDP: 4.0000 mg | ORAL_TABLET | Freq: Three times a day (TID) | ORAL | 0 refills | Status: DC | PRN

## 2018-06-16 NOTE — Discharge Instructions (Signed)
Take Zofran for nausea.  Take Pepcid as directed.  As we discussed, drinking alcohol can worsen gastritis.  Additionally, spicy, greasy foods can sometimes irritate gastritis.  Follow-up with your primary care doctor in the next several days.  Return to the Emergency Department immediately if you experience any worsening abdominal pain, fever, persistent nausea and vomiting, inability keep any food down, pain with urination, blood in your urine or any other worsening or concerning symptoms.

## 2018-06-16 NOTE — ED Triage Notes (Signed)
Upper belly pain starting week ago. Reports 2 episodes of vomiting. Tender. Reports fevers.

## 2018-06-16 NOTE — ED Notes (Signed)
Pt returns from ct  

## 2018-06-16 NOTE — ED Provider Notes (Signed)
[place] EMERGENCY DEPARTMENT Provider Note   CSN: [id number] Arrival date & time: 06/16/18  6387     History   Chief Complaint Chief Complaint  Patient presents with  . Abdominal Pain    HPI David Burton is a 73 y.o. male past with history of diabetes, GERD, hypertension, MI who presents for evaluation of left upper and lower quadrant abdominal pain that is been ongoing for the last week.  He reports pain is been progressively worsening.  He also reports that since yesterday, he is started to have nausea/vomiting.  He reports approximately 3-4 episodes of nonbloody, nonbilious vomiting.  He also reports that he is noticed his urine is darker than normal.  He states that he noticed it was "a little bit brown and then cleared up."  He denies any dysuria.  Patient states he has had some subjective fever chills but has not measured a temperature.  He describes the pain as a sharp pain that comes.  He states it does not radiate to the back or to the lower front abdomen.  He reports taking Advil with no improvement in symptoms.  Patient does report that he drinks about a pint of alcohol a week.  His last alcohol use was 2 days ago.  He is not a current smoker.  He does report a history of diabetes and states that his sugars have been in the 100s.  He does report taking his medications.  His last bowel movement was 2 days ago.  He does not know if he has been passing flatus.  No history of abdominal surgeries.  Patient denies any chest pain, difficulty breathing, dysuria.  The history is provided by the patient.    Past Medical History:  Diagnosis Date  . Bell palsy   . Diabetes mellitus   . Diabetes mellitus without complication ([place])   . GERD (gastroesophageal reflux disease)   . Hypertension   . Myocardial infarction ([place])   . Stroke [facility])     There are no active problems to display for this patient.   Past Surgical History:  Procedure Laterality Date  . CARDIAC  SURGERY    . COLONOSCOPY WITH PROPOFOL N/A 01/02/2018   Procedure: COLONOSCOPY WITH PROPOFOL;  Surgeon: Carol Ada, MD;  Location: [facility];  Service: Endoscopy;  Laterality: N/A;  . POLYPECTOMY  01/02/2018   Procedure: POLYPECTOMY;  Surgeon: Carol Ada, MD;  Location[facility];  Service: Endoscopy;;  . stents          Home Medications    Prior to Admission medications   Medication Sig Start Date End Date Taking? Authorizing Provider  aspirin EC 81 MG tablet Take 81 mg by mouth daily.    [provider]  atorvastatin (LIPITOR) 40 MG tablet Take 40 mg by mouth daily. 06/13/17   [provider]  famotidine (PEPCID) 10 MG tablet Take 1 tablet (10 mg total) by mouth 2 (two) times daily for 4 days. 06/16/18 06/20/18  Volanda Napoleon, PA-C  furosemide (LASIX) 20 MG tablet Take 20 mg by mouth daily.    [provider]  HYDROcodone-acetaminophen (NORCO) 10-325 MG tablet Take 1 tablet by mouth 3 (three) times daily.  02/04/15   [provider]  ibuprofen (ADVIL,MOTRIN) 200 MG tablet Take 400 mg by mouth every 6 (six) hours as needed for headache or moderate pain.     [provider]  insulin aspart (NOVOLOG) 100 UNIT/ML FlexPen Inject 10 Units into the skin daily.  06/10/17   [provider]  insulin detemir (LEVEMIR) 100 UNIT/ML injection Inject 10 Units into the skin at bedtime.  06/10/17   [provider]  metFORMIN (GLUCOPHAGE) 500 MG tablet Take 500 mg by mouth daily with breakfast.    [provider]  metoprolol tartrate (LOPRESSOR) 50 MG tablet Take 50 mg by mouth daily. 06/13/17   [provider]  nitroGLYCERIN (NITROSTAT) 0.4 MG SL tablet Place 0.4 mg under the tongue every 5 (five) minutes as needed for chest pain.    [provider]  omeprazole (PRILOSEC) 20 MG capsule Take 1 capsule (20 mg total) by mouth daily. 02/28/18   Charlesetta Shanks, MD  ondansetron (ZOFRAN ODT) 4 MG disintegrating tablet  Take 1 tablet (4 mg total) by mouth every 8 (eight) hours as needed for nausea or vomiting. 06/16/18   Volanda Napoleon, PA-C  testosterone cypionate (DEPOTESTOSTERONE CYPIONATE) 200 MG/ML injection Inject 200 mg into the muscle every 14 (fourteen) days. 12/10/17   [provider]  zolpidem (AMBIEN) 10 MG tablet Take 10 mg by mouth at bedtime.  12/30/14   [provider]    Family History Family History  Problem Relation Age of Onset  . Heart disease Other     Social History Social History   Tobacco Use  . Smoking status: Never Smoker  . Smokeless tobacco: Never Used  Substance Use Topics  . Alcohol use: Yes    Comment: 2/week, whiskey with Ginger-ale  . Drug use: No     Allergies   Patient has no known allergies.   Review of Systems Review of Systems  Constitutional: Positive for chills and fever.  Respiratory: Negative for cough and shortness of breath.   Cardiovascular: Negative for chest pain.  Gastrointestinal: Positive for abdominal pain, nausea and vomiting.  Genitourinary: Negative for dysuria and hematuria.  Neurological: Negative for headaches.  All other systems reviewed and are negative.    Physical Exam Updated Vital Signs BP (!) 165/114 (BP Location: Right Arm)   Pulse 88   Temp 97.6 F (36.4 C) (Oral)   Resp 18   Ht 5\' 6"  (1.676 m)   Wt 113.4 kg   SpO2 92%   BMI 40.35 kg/m   Physical Exam Vitals signs and nursing note reviewed.  Constitutional:      Appearance: Normal appearance. He is well-developed.     Comments: Sitting comfortably on examination table  HENT:     Head: Normocephalic and atraumatic.  Eyes:     General: Lids are normal.     Conjunctiva/sclera: Conjunctivae normal.     Pupils: Pupils are equal, round, and reactive to light.  Neck:     Musculoskeletal: Full passive range of motion without pain.  Cardiovascular:     Rate and Rhythm: Normal rate and regular rhythm.     Pulses: Normal pulses.           Radial pulses are 2+ on the right side and 2+ on the left side.       Dorsalis pedis pulses are 2+ on the right side and 2+ on the left side.     Heart sounds: Normal heart sounds. No murmur. No friction rub. No gallop.   Pulmonary:     Effort: Pulmonary effort is normal.     Breath sounds: Normal breath sounds.     Comments: Lungs clear to auscultation bilaterally.  Symmetric chest rise.  No wheezing, rales, rhonchi. Abdominal:     General: Bowel sounds are  normal.     Palpations: Abdomen is soft. Abdomen is not rigid.     Tenderness: There is abdominal tenderness in the left upper quadrant. There is no right CVA tenderness, left CVA tenderness or guarding.     Comments: Abdomen is soft, nondistended.  Tenderness noted to the left upper quadrant.  No rigidity, guarding.  No CVA tenderness bilaterally.  Musculoskeletal: Normal range of motion.  Skin:    General: Skin is warm and dry.     Capillary Refill: Capillary refill takes less than 2 seconds.  Neurological:     Mental Status: He is alert and oriented to person, place, and time.  Psychiatric:        Speech: Speech normal.      ED Treatments / Results  Labs (all labs ordered are listed, but only abnormal results are displayed) Labs Reviewed  CBC WITH DIFFERENTIAL/PLATELET - Abnormal; Notable for the following components:      Result Value   WBC 12.2 (*)    Platelets 125 (*)    Neutro Abs 8.0 (*)    Monocytes Absolute 1.2 (*)    Abs Immature Granulocytes 0.09 (*)    All other components within normal limits  URINALYSIS, ROUTINE W REFLEX MICROSCOPIC - Abnormal; Notable for the following components:   Color, Urine STRAW (*)    Hgb urine dipstick SMALL (*)    Leukocytes, UA TRACE (*)    Bacteria, UA MANY (*)    All other components within normal limits  COMPREHENSIVE METABOLIC PANEL - Abnormal; Notable for the following components:   Chloride 113 (*)    CO2 21 (*)    Glucose, Bld 120 (*)    Creatinine, Ser 1.65 (*)     Calcium 8.8 (*)    Albumin 3.1 (*)    GFR calc non Af Amer 41 (*)    GFR calc Af Amer 48 (*)    All other components within normal limits  CBG MONITORING, ED - Abnormal; Notable for the following components:   Glucose-Capillary 117 (*)    All other components within normal limits  LIPASE, BLOOD    EKG EKG Interpretation  Date/Time:  Tuesday June 16 2018 09:15:37 EST Ventricular Rate:  73 PR Interval:    QRS Duration: 115 QT Interval:  372 QTC Calculation: 410 R Axis:   -37 Text Interpretation:  Sinus rhythm Incomplete right bundle branch block Inferior infarct, old Artifact Baseline wander Confirmed by Blanchie Dessert [phone]) on 06/16/2018 9:28:25 AM Also confirmed by Blanchie Dessert [phone]), editor Shon Hale [phone])  on 06/17/2018 9:42:32 AM   Radiology Ct Abdomen Pelvis W Contrast  Result Date: 06/16/2018 CLINICAL DATA:  Acute generalized abdominal pain. EXAM: CT ABDOMEN AND PELVIS WITH CONTRAST TECHNIQUE: Multidetector CT imaging of the abdomen and pelvis was performed using the standard protocol following bolus administration of intravenous contrast. CONTRAST:  158mL ISOVUE-370 IOPAMIDOL (ISOVUE-370) INJECTION 76% COMPARISON:  None. FINDINGS: Lower chest: No acute abnormality. Hepatobiliary: No focal liver abnormality is seen. No gallstones, gallbladder wall thickening, or biliary dilatation. Pancreas: Unremarkable. No pancreatic ductal dilatation or surrounding inflammatory changes. Spleen: Normal in size without focal abnormality. Adrenals/Urinary Tract: Adrenal glands are unremarkable. Kidneys are normal, without renal calculi, focal lesion, or hydronephrosis. Bladder is unremarkable. Stomach/Bowel: Stomach is within normal limits. Appendix appears normal. No evidence of bowel wall thickening, distention, or inflammatory changes. Vascular/Lymphatic: Aortic atherosclerosis. No enlarged abdominal or pelvic lymph nodes. Reproductive: Mild prostatic enlargement is noted with  extension into inferior portion of urinary  bladder. Other: No abdominal wall hernia or abnormality. No abdominopelvic ascites. Musculoskeletal: No acute or significant osseous findings. IMPRESSION: Mild prostatic enlargement as described above. No other significant abnormality seen in the abdomen or pelvis. Aortic Atherosclerosis (ICD10-I70.0). Electronically Signed   By: Marijo Conception, M.D.   On: 06/16/2018 14:24    Procedures Procedures (including critical care time)  Medications Ordered in ED Medications  ondansetron (ZOFRAN) injection 4 mg (4 mg Intravenous Given 06/16/18 0857)  morphine 4 MG/ML injection 4 mg (4 mg Intravenous Given 06/16/18 1004)  sodium chloride 0.9 % bolus 1,000 mL (0 mLs Intravenous Stopped 06/16/18 1145)  famotidine (PEPCID) IVPB 20 mg premix (0 mg Intravenous Stopped 06/16/18 1341)  metoprolol tartrate (LOPRESSOR) injection 2.5 mg (2.5 mg Intravenous Given 06/16/18 1307)  fentaNYL (SUBLIMAZE) injection 100 mcg (100 mcg Intravenous Given 06/16/18 1309)  iopamidol (ISOVUE-370) 76 % injection (100 mLs  Contrast Given 06/16/18 1415)  alum & mag hydroxide-simeth (MAALOX/MYLANTA) 200-200-20 MG/5ML suspension 30 mL (30 mLs Oral Given 06/16/18 1615)    And  lidocaine (XYLOCAINE) 2 % viscous mouth solution 15 mL (15 mLs Oral Given 06/16/18 1615)     Initial Impression / Assessment and Plan / ED Course  I have reviewed the triage vital signs and the nursing notes.  Pertinent labs & imaging results that were available during my care of the patient were reviewed by me and considered in my medical decision making (see chart for details).     73 year old male who presents for evaluation of left-sided abdominal pain that began about a week ago.  Associated with nausea vomiting that began yesterday.  Reports subjective fever and chills at home. Patient is afebrile, non-toxic appearing, sitting comfortably on examination table. Vital signs reviewed and stable.  Patient with some left  upper quadrant abdominal tenderness.  Concern for infectious etiology versus pancreatitis versus gastritis. History/physical exam is not concerning for ACS etiology but given patient's history, will obtain screening EKG.   POC CBG shows glucose of 117.  CMP shows bicarb 21.  BUN is 21.  Creatinine is 1.65.  Review of records show that he is normally between 1.4 and 1.5.  He has had some episodes where his creatinine has been as high as 1.8.  UA shows small hemoglobin, trace leukocytes.  CBC shows slight leukocytosis of 12.2.  Otherwise unremarkable.  Platelets are 125.  Review of records show that he has previously had thrombocytopenia before.   Reevaluation.  Patient reported he is still having pain.  He still significantly tender in the left upper quadrant.  Will give additional analgesics and plan for CT on pelvis.  CT shows mild prosthetic enlargement.  No other acute abnormality seen.  Reevaluation.  Patient reports improvement in pain after analgesics.  He still has a little bit of pain but states it is significantly improved.  Patient was hypertensive throughout ED course.  He has not taken any of his blood pressure medication.  He was given a dose of Lopressor here in the ED.  He had systolic blood pressure of 150.  Repeat abdominal exam is improved.  Patient stable for discharge at this time.  We will plan to treat as gastritis.  Encourage patient on at home supportive care measures.  Patient to follow-up with his primary care doctor. At this time, patient exhibits no emergent life-threatening condition that require further evaluation in ED or admission. Patient had ample opportunity for questions and discussion. All patient's questions were answered with full understanding. Strict return  precautions discussed. Patient expresses understanding and agreement to plan.   Portions of this note were generated with [employer]. Dictation errors may occur despite best attempts at  proofreading.   Final Clinical Impressions(s) / ED Diagnoses   Final diagnoses:  Acute gastritis without hemorrhage, unspecified gastritis type    ED Discharge Orders         Ordered    famotidine (PEPCID) 10 MG tablet  2 times daily     06/16/18 1528    ondansetron (ZOFRAN ODT) 4 MG disintegrating tablet  Every 8 hours PRN     06/16/18 1528           Desma Mcgregor 06/17/18 2131    Blanchie Dessert, MD 06/20/18 1339

## 2018-06-16 NOTE — ED Notes (Signed)
Pt stawst he understands instructions and home stable with steady gait.Provider aware of vs.

## 2018-09-02 ENCOUNTER — Other Ambulatory Visit: Payer: Self-pay

## 2018-09-02 MED ORDER — NITROGLYCERIN 0.4 MG SL SUBL: 0.4000 mg | SUBLINGUAL_TABLET | SUBLINGUAL | 1 refills | Status: DC | PRN

## 2018-10-12 ENCOUNTER — Ambulatory Visit: Payer: Self-pay | Admitting: Cardiology

## 2018-12-02 ENCOUNTER — Ambulatory Visit: Payer: Self-pay | Admitting: Cardiology

## 2018-12-11 ENCOUNTER — Ambulatory Visit: Payer: Self-pay | Admitting: Cardiology

## 2018-12-16 ENCOUNTER — Encounter: Payer: Self-pay | Admitting: Cardiology

## 2018-12-16 DIAGNOSIS — I2510 Atherosclerotic heart disease of native coronary artery without angina pectoris: Secondary | ICD-10-CM | POA: Insufficient documentation

## 2018-12-16 DIAGNOSIS — I10 Essential (primary) hypertension: Secondary | ICD-10-CM | POA: Insufficient documentation

## 2018-12-16 DIAGNOSIS — E7800 Pure hypercholesterolemia, unspecified: Secondary | ICD-10-CM

## 2018-12-16 DIAGNOSIS — I129 Hypertensive chronic kidney disease with stage 1 through stage 4 chronic kidney disease, or unspecified chronic kidney disease: Secondary | ICD-10-CM | POA: Insufficient documentation

## 2018-12-16 DIAGNOSIS — E1142 Type 2 diabetes mellitus with diabetic polyneuropathy: Secondary | ICD-10-CM

## 2018-12-16 DIAGNOSIS — N183 Chronic kidney disease, stage 3 unspecified: Secondary | ICD-10-CM | POA: Insufficient documentation

## 2018-12-16 DIAGNOSIS — I25110 Atherosclerotic heart disease of native coronary artery with unstable angina pectoris: Secondary | ICD-10-CM

## 2018-12-16 DIAGNOSIS — I119 Hypertensive heart disease without heart failure: Secondary | ICD-10-CM

## 2018-12-16 HISTORY — DX: Atherosclerotic heart disease of native coronary artery with unstable angina pectoris: I25.110

## 2018-12-16 HISTORY — DX: Hypertensive heart disease without heart failure: I11.9

## 2018-12-16 HISTORY — DX: Pure hypercholesterolemia, unspecified: E78.00

## 2018-12-16 HISTORY — DX: Chronic kidney disease, stage 3 unspecified: N18.30

## 2018-12-16 HISTORY — DX: Type 2 diabetes mellitus with diabetic polyneuropathy: E11.42

## 2018-12-18 ENCOUNTER — Encounter: Payer: Self-pay | Admitting: Cardiology

## 2018-12-18 ENCOUNTER — Other Ambulatory Visit: Payer: Self-pay | Admitting: Cardiology

## 2018-12-18 ENCOUNTER — Ambulatory Visit (INDEPENDENT_AMBULATORY_CARE_PROVIDER_SITE_OTHER): Payer: Medicare HMO | Admitting: Cardiology

## 2018-12-18 ENCOUNTER — Other Ambulatory Visit: Payer: Self-pay

## 2018-12-18 VITALS — BP 129/73 | HR 70 | Ht 67.0 in | Wt 253.9 lb

## 2018-12-18 DIAGNOSIS — I25118 Atherosclerotic heart disease of native coronary artery with other forms of angina pectoris: Secondary | ICD-10-CM

## 2018-12-18 DIAGNOSIS — I130 Hypertensive heart and chronic kidney disease with heart failure and stage 1 through stage 4 chronic kidney disease, or unspecified chronic kidney disease: Secondary | ICD-10-CM | POA: Diagnosis not present

## 2018-12-18 DIAGNOSIS — E1142 Type 2 diabetes mellitus with diabetic polyneuropathy: Secondary | ICD-10-CM

## 2018-12-18 DIAGNOSIS — I25110 Atherosclerotic heart disease of native coronary artery with unstable angina pectoris: Secondary | ICD-10-CM

## 2018-12-18 DIAGNOSIS — E7800 Pure hypercholesterolemia, unspecified: Secondary | ICD-10-CM

## 2018-12-18 DIAGNOSIS — IMO0002 Reserved for concepts with insufficient information to code with codable children: Secondary | ICD-10-CM

## 2018-12-18 DIAGNOSIS — E1165 Type 2 diabetes mellitus with hyperglycemia: Secondary | ICD-10-CM

## 2018-12-18 DIAGNOSIS — N183 Chronic kidney disease, stage 3 unspecified: Secondary | ICD-10-CM

## 2018-12-18 DIAGNOSIS — Z951 Presence of aortocoronary bypass graft: Secondary | ICD-10-CM

## 2018-12-18 DIAGNOSIS — I119 Hypertensive heart disease without heart failure: Secondary | ICD-10-CM

## 2018-12-18 MED ORDER — ISOSORBIDE MONONITRATE ER 60 MG PO TB24: 60.0000 mg | ORAL_TABLET | Freq: Every day | ORAL | 1 refills | Status: DC

## 2018-12-18 NOTE — Progress Notes (Signed)
Primary Physician:  Harvie Junior, MD   Patient ID: David Burton, male    DOB: 1945/12/09, 73 y.o.   MRN: [id number]  Subjective:    Chief Complaint  Patient presents with  . Chest Pain  . Hypertension  . Coronary Artery Disease  . Follow-up    HPI: David Burton  is a 73 y.o. male  with history of NSTEMI in Dec 2018, s/p CABG x 4 on 05/08/2017 (LIMA to LAD, SVG to PDA, SVG to Diagonal, SVG to Ramus. Surgery done at [facility], [place]), diabetes mellitus type 2 with peripheral neuropathy, hypertension and hypercholesterolemia.   He has noticed over the last month, he has exertional chest pain and dyspnea on exertion that is resolved with nitroglycerin. Symptoms do not occur daily, but notices them when doing activities outside of his normal activities and particularly with walking uphill. Chest pain makes him stop and rest and if does not improve with resting, he takes nitroglycerin that resolves chest pain almost immediately. He also notices shortness of breath with exertion that he attributes to his weight.  He underwent nuclear stress testing in May 2019 that revealed mild intensity ischemia, considered intermediate risk study. Echo also at that time showed LVEF of 45%. Medical management was recommended in view that he was without chest pain.   He does report that diabetes is uncontrolled with BS running in the 300's. He reports that PCP has recently made changes to his insulin. States that hypertension and hyperlipidemia is well controlled.   Past Medical History:  Diagnosis Date  . Bell palsy   . CKD (chronic kidney disease), stage III ([place]) 12/16/2018  . Coronary artery disease involving native coronary artery of native heart with unstable angina pectoris ([place]) 12/16/2018  . Diabetes mellitus   . Diabetes mellitus without complication ([place])   . GERD (gastroesophageal reflux disease)   . Hypertension   . Hypertension with heart disease 12/16/2018  . Myocardial  infarction ([place])   . Pure hypercholesterolemia 12/16/2018  . Stroke ([place])   . Type 2 diabetes, controlled, with peripheral neuropathy ([place]) 12/16/2018    Past Surgical History:  Procedure Laterality Date  . CARDIAC SURGERY    . COLONOSCOPY WITH PROPOFOL N/A 01/02/2018   Procedure: COLONOSCOPY WITH PROPOFOL;  Surgeon: Carol Ada, MD;  Location: [facility];  Service: Endoscopy;  Laterality: N/A;  . POLYPECTOMY  01/02/2018   Procedure: POLYPECTOMY;  Surgeon: Carol Ada, MD;  Location: [facility];  Service: Endoscopy;;  . stents      Social History   Socioeconomic History  . Marital status: Widowed    Spouse name: Not on file  . Number of children: 3  . Years of education: Not on file  . Highest education level: Not on file  Occupational History  . Not on file  Social Needs  . Financial resource strain: Not on file  . Food insecurity    Worry: Not on file    Inability: Not on file  . Transportation needs    Medical: Not on file    Non-medical: Not on file  Tobacco Use  . Smoking status: Never Smoker  . Smokeless tobacco: Never Used  Substance and Sexual Activity  . Alcohol use: Yes    Comment: 2/week, whiskey with [place]  . Drug use: No  . Sexual activity: Never  Lifestyle  . Physical activity    Days per week: Not on file    Minutes per session: Not on file  . Stress:  Not on file  Relationships  . Social Herbalist on phone: Not on file    Gets together: Not on file    Attends religious service: Not on file    Active member of club or organization: Not on file    Attends meetings of clubs or organizations: Not on file    Relationship status: Not on file  . Intimate partner violence    Fear of current or ex partner: Not on file    Emotionally abused: Not on file    Physically abused: Not on file    Forced sexual activity: Not on file  Other Topics Concern  . Not on file  Social History Narrative   ** Merged History Encounter **         Review of Systems  Constitution: Negative for decreased appetite, malaise/fatigue, weight gain and weight loss.  Eyes: Negative for visual disturbance.  Cardiovascular: Positive for chest pain and dyspnea on exertion. Negative for claudication, leg swelling, orthopnea, palpitations and syncope.  Respiratory: Negative for hemoptysis and wheezing.   Endocrine: Negative for cold intolerance and heat intolerance.  Hematologic/Lymphatic: Does not bruise/bleed easily.  Skin: Negative for nail changes.  Musculoskeletal: Negative for muscle weakness and myalgias.  Gastrointestinal: Positive for abdominal pain (left upper abd pain-follows Dr. Benson Norway). Negative for change in bowel habit, nausea and vomiting.  Neurological: Negative for difficulty with concentration, dizziness, focal weakness and headaches.  Psychiatric/Behavioral: Negative for altered mental status and suicidal ideas.  All other systems reviewed and are negative.     Objective:  Blood pressure 129/73, pulse 70, height 5\' 7"  (1.702 m), weight 253 lb 14.4 oz (115.2 kg), SpO2 95 %. Body mass index is 39.77 kg/m.    Physical Exam  Constitutional: He is oriented to person, place, and time. Vital signs are normal. He appears well-developed and well-nourished.  Morbidly obese  HENT:  Head: Normocephalic and atraumatic.  Neck: Normal range of motion.  Cardiovascular: Normal rate, regular rhythm, normal heart sounds and intact distal pulses.  Pulmonary/Chest: Effort normal and breath sounds normal. No accessory muscle usage. No respiratory distress.    Sternotomy scar from CABG  Abdominal: Soft. Bowel sounds are normal.  Musculoskeletal: Normal range of motion.  Neurological: He is alert and oriented to person, place, and time.  Skin: Skin is warm and dry.  Vitals reviewed.  Radiology: No results found.  Laboratory examination:    CMP Latest Ref Rng & Units 06/16/2018 02/28/2018 07/11/2017  Glucose 70 - 99 mg/dL 120(H) 123(H) 76   BUN 8 - 23 mg/dL 21 20 29(H)  Creatinine 0.61 - 1.24 mg/dL 1.65(H) 1.57(H) 1.83(H)  Sodium 135 - 145 mmol/L 142 136 140  Potassium 3.5 - 5.1 mmol/L 4.2 4.2 3.7  Chloride 98 - 111 mmol/L 113(H) 106 109  CO2 22 - 32 mmol/L 21(L) 22 19(L)  Calcium 8.9 - 10.3 mg/dL 8.8(L) 8.8(L) 9.5  Total Protein 6.5 - 8.1 g/dL 6.6 - -  Total Bilirubin 0.3 - 1.2 mg/dL 0.7 - -  Alkaline Phos 38 - 126 U/L 48 - -  AST 15 - 41 U/L 23 - -  ALT 0 - 44 U/L 23 - -   CBC Latest Ref Rng & Units 06/16/2018 02/28/2018 07/11/2017  WBC 4.0 - 10.5 K/uL 12.2(H) 9.0 11.7(H)  Hemoglobin 13.0 - 17.0 g/dL 15.3 15.5 14.4  Hematocrit 39.0 - 52.0 % 46.0 46.8 42.7  Platelets 150 - 400 K/uL 125(L) 123(L) 191   Lipid Panel  No results found for: CHOL, TRIG, HDL, CHOLHDL, VLDL, LDLCALC, LDLDIRECT HEMOGLOBIN A1C No results found for: HGBA1C, MPG TSH No results for input(s): TSH in the last 8760 hours.  PRN Meds:. Medications Discontinued During This Encounter  Medication Reason  . atorvastatin (LIPITOR) 40 MG tablet Change in therapy  . ondansetron (ZOFRAN ODT) 4 MG disintegrating tablet No longer needed (for PRN medications)  . testosterone cypionate (DEPOTESTOSTERONE CYPIONATE) 200 MG/ML injection Discontinued by provider  . zolpidem (AMBIEN) 10 MG tablet Patient Preference   Current Meds  Medication Sig  . aspirin EC 81 MG tablet Take 81 mg by mouth daily.  . furosemide (LASIX) 20 MG tablet Take 20 mg by mouth 2 (two) times daily.   Marland Kitchen HYDROcodone-acetaminophen (NORCO) 10-325 MG tablet Take 1 tablet by mouth 3 (three) times daily.   Marland Kitchen ibuprofen (ADVIL,MOTRIN) 200 MG tablet Take 400 mg by mouth every 6 (six) hours as needed for headache or moderate pain.   Marland Kitchen insulin aspart (NOVOLOG) 100 UNIT/ML FlexPen Inject 10 Units into the skin daily.  . insulin detemir (LEVEMIR) 100 UNIT/ML injection Inject 20 Units into the skin at bedtime.   . irbesartan (AVAPRO) 300 MG tablet daily.  Marland Kitchen LINZESS 72 MCG capsule daily.  . metFORMIN  (GLUCOPHAGE) 500 MG tablet Take 500 mg by mouth 2 (two) times a day.   . metoprolol tartrate (LOPRESSOR) 50 MG tablet Take 50 mg by mouth daily.  . nitroGLYCERIN (NITROSTAT) 0.4 MG SL tablet Place 1 tablet (0.4 mg total) under the tongue every 5 (five) minutes as needed for chest pain.  Marland Kitchen omeprazole (PRILOSEC) 20 MG capsule Take 1 capsule (20 mg total) by mouth daily.  . QUEtiapine (SEROQUEL) 25 MG tablet at bedtime.  . simvastatin (ZOCOR) 40 MG tablet daily.    Cardiac Studies:    Lexiscan myoview stress test 10/10/2017: 1. Lexiscan stress test was performed. Exercise capacity was not assessed. Stress symptoms included dizziness. Normal blood pressure. The resting electrocardiogram demonstrated normal sinus rhythm, normal resting conduction, no resting arrhythmias and normal rest repolarization. Stress EKG is non diagnostic for ischemia as it is a pharmacologic stress. 2. The overall quality of the study is good. Left ventricular cavity is noted to be normal on the rest and stress studies. Gated SPECT images reveal normal myocardial thickening and wall motion, except mild inferior hypokinesis. The left ventricular ejection fraction was calculated or visually estimated to be 53%. SPECT stress images demonstrate medium size, moderate intensity perfusion defect in basal to apical inferior, inferolateral myocardium. SPECT rest images demonstrate medium size, mild intensity perfusion defect in basal to apical inferior myocardium. This suggests inferior infarct with periinfarct mild ischemia, as well as inferolateral ischemia. (LCx/PDA territory). 3. Intermediate risk study.  Echocardiogram  10/07/2017: Left ventricle cavity is normal in size. Concentric remodeling of the left ventricle. Mild decrease in global wall motion. Visual EF is 45-50%. Doppler evidence of grade I (impaired) diastolic dysfunction, normal LAP. Moderate aortic valve leaflet calcification. Trace aortic valve stenosis. Mild  tricuspid regurgitation. No evidence of pulmonary hypertension.  Assessment:     ICD-10-CM   1. Coronary artery disease of native heart with stable angina pectoris, unspecified vessel or lesion type (HCC)  I25.118 EKG 12-Lead    PCV ECHOCARDIOGRAM COMPLETE    Basic metabolic panel    Basic metabolic panel  2. Hypertension with heart disease  I11.9   3. S/P CABG x 4  Z95.1   4. CKD (chronic kidney disease), stage III (HCC)  N18.3 Basic  metabolic panel    Basic metabolic panel  5. Pure hypercholesterolemia  E78.00   6. Uncontrolled type 2 diabetes mellitus with peripheral neuropathy ([place])  E11.42    E11.65     EKG 12/18/2018: Normal sinus rhythm at 68 bpm, left anterior hemi-block, IRBBB. No evidence of ischemia. Unchanged from EKG in 03/06/2018.   Recommendations:   Over the last 1 month, patient has had chest pain and shortness of breath with activity outside of his normal activities that is relieved with nitroglycerin.  He has had intermediate risk stress test and May 2019.  LVEF was mildly reduced at that time.  In view of his history, risk factors, and angina, feel that he should proceed with coronary angiogram for further evaluation.  Schedule for cardiac catheterization, and possible angioplasty. We discussed regarding risks, benefits, alternatives to this including stress testing, CTA and continued medical therapy. Patient wants to proceed. Understands <1-2% risk of death, stroke, MI, urgent CABG, bleeding, infection, renal failure but not limited to these.  He has uncontrolled type 2 diabetes that is being managed by his PCP and has had recent changes to his insulin.  He also has stage III chronic kidney disease and will need adequate hydration prior to procedure.  I will start him on Imdur 60 mg daily for angina.  Will obtain echocardiogram in the next 1 week and plan for coronary angiogram in 2 weeks.  No acute changes were noted to his EKG.  Blood pressure is well controlled,  continue with other medical therapy.  No evidence of acute decompensated heart failure by clinical exam.  We will see him back after the procedure for follow-up.  Advised him to take it easy until his procedure and to contact us for any worsening symptoms.   *I have discussed this case with Dr. Einar Gip and he personally examined the patient and participated in formulating the plan.*   Miquel Dunn, MSN, APRN, FNP-C [facility]. [place] Office: [phone] Fax: [phone]

## 2018-12-21 ENCOUNTER — Other Ambulatory Visit: Payer: Self-pay | Admitting: Cardiology

## 2018-12-21 DIAGNOSIS — I25118 Atherosclerotic heart disease of native coronary artery with other forms of angina pectoris: Secondary | ICD-10-CM

## 2018-12-21 NOTE — Progress Notes (Signed)
CBC order placed

## 2018-12-22 ENCOUNTER — Other Ambulatory Visit: Payer: Self-pay | Admitting: Cardiology

## 2018-12-22 LAB — BASIC METABOLIC PANEL
BUN/Creatinine Ratio: 19 (ref 10–24)
BUN: 37 mg/dL — ABNORMAL HIGH (ref 8–27)
CO2: 21 mmol/L (ref 20–29)
Calcium: 10.2 mg/dL (ref 8.6–10.2)
Chloride: 102 mmol/L (ref 96–106)
Creatinine, Ser: 2 mg/dL — ABNORMAL HIGH (ref 0.76–1.27)
GFR calc Af Amer: 38 mL/min/{1.73_m2} — ABNORMAL LOW (ref >59)
GFR calc non Af Amer: 33 mL/min/{1.73_m2} — ABNORMAL LOW (ref >59)
Glucose: 214 mg/dL — ABNORMAL HIGH (ref 65–99)
Potassium: 4.8 mmol/L (ref 3.5–5.2)
Sodium: 140 mmol/L (ref 134–144)

## 2018-12-23 ENCOUNTER — Other Ambulatory Visit: Payer: Self-pay

## 2018-12-23 ENCOUNTER — Ambulatory Visit (INDEPENDENT_AMBULATORY_CARE_PROVIDER_SITE_OTHER): Payer: Medicare HMO

## 2018-12-23 DIAGNOSIS — I25118 Atherosclerotic heart disease of native coronary artery with other forms of angina pectoris: Secondary | ICD-10-CM | POA: Diagnosis not present

## 2018-12-24 ENCOUNTER — Telehealth: Payer: Self-pay | Admitting: Cardiology

## 2018-12-24 NOTE — Telephone Encounter (Signed)
S/w patient advised him to stop Irbesartan

## 2018-12-24 NOTE — Telephone Encounter (Signed)
-----   Message from Adrian Prows, MD sent at 12/24/2018  7:34 AM EDT ----- He is on Irbesartan and has to be stopped couple days before.

## 2018-12-24 NOTE — Progress Notes (Signed)
He is on Irbesartan and has to be stopped couple days before.

## 2019-01-01 ENCOUNTER — Other Ambulatory Visit (HOSPITAL_COMMUNITY)
Admission: RE | Admit: 2019-01-01 | Discharge: 2019-01-01 | Disposition: A | Discharge status: Home / Self Care | Payer: Medicare HMO | Source: Ambulatory Visit | Attending: Cardiology | Admitting: Cardiology

## 2019-01-01 DIAGNOSIS — Z20828 Contact with and (suspected) exposure to other viral communicable diseases: Secondary | ICD-10-CM | POA: Insufficient documentation

## 2019-01-01 DIAGNOSIS — Z01812 Encounter for preprocedural laboratory examination: Secondary | ICD-10-CM | POA: Insufficient documentation

## 2019-01-02 LAB — SARS CORONAVIRUS 2 (TAT 6-24 HRS): SARS Coronavirus 2: NEGATIVE

## 2019-01-05 ENCOUNTER — Encounter (HOSPITAL_COMMUNITY)
Admission: RE | Disposition: A | Discharge status: Home / Self Care | Payer: Self-pay | Source: Home / Self Care | Attending: Cardiology

## 2019-01-05 ENCOUNTER — Encounter (HOSPITAL_COMMUNITY): Payer: Self-pay | Admitting: Cardiology

## 2019-01-05 ENCOUNTER — Ambulatory Visit (HOSPITAL_COMMUNITY)
Admission: RE | Admit: 2019-01-05 | Discharge: 2019-01-05 | Disposition: A | Discharge status: Home / Self Care | Payer: Medicare HMO | Attending: Cardiology | Admitting: Cardiology

## 2019-01-05 ENCOUNTER — Other Ambulatory Visit: Payer: Self-pay

## 2019-01-05 DIAGNOSIS — E7800 Pure hypercholesterolemia, unspecified: Secondary | ICD-10-CM | POA: Diagnosis not present

## 2019-01-05 DIAGNOSIS — I2510 Atherosclerotic heart disease of native coronary artery without angina pectoris: Secondary | ICD-10-CM | POA: Diagnosis present

## 2019-01-05 DIAGNOSIS — I25118 Atherosclerotic heart disease of native coronary artery with other forms of angina pectoris: Secondary | ICD-10-CM | POA: Diagnosis not present

## 2019-01-05 DIAGNOSIS — E1165 Type 2 diabetes mellitus with hyperglycemia: Secondary | ICD-10-CM | POA: Insufficient documentation

## 2019-01-05 DIAGNOSIS — N183 Chronic kidney disease, stage 3 (moderate): Secondary | ICD-10-CM | POA: Insufficient documentation

## 2019-01-05 DIAGNOSIS — I209 Angina pectoris, unspecified: Secondary | ICD-10-CM | POA: Diagnosis present

## 2019-01-05 DIAGNOSIS — Z8673 Personal history of transient ischemic attack (TIA), and cerebral infarction without residual deficits: Secondary | ICD-10-CM | POA: Diagnosis not present

## 2019-01-05 DIAGNOSIS — I129 Hypertensive chronic kidney disease with stage 1 through stage 4 chronic kidney disease, or unspecified chronic kidney disease: Secondary | ICD-10-CM | POA: Diagnosis not present

## 2019-01-05 DIAGNOSIS — Z79899 Other long term (current) drug therapy: Secondary | ICD-10-CM | POA: Insufficient documentation

## 2019-01-05 DIAGNOSIS — I25119 Atherosclerotic heart disease of native coronary artery with unspecified angina pectoris: Secondary | ICD-10-CM | POA: Insufficient documentation

## 2019-01-05 DIAGNOSIS — E1122 Type 2 diabetes mellitus with diabetic chronic kidney disease: Secondary | ICD-10-CM | POA: Diagnosis not present

## 2019-01-05 DIAGNOSIS — I252 Old myocardial infarction: Secondary | ICD-10-CM | POA: Diagnosis not present

## 2019-01-05 DIAGNOSIS — E785 Hyperlipidemia, unspecified: Secondary | ICD-10-CM | POA: Diagnosis not present

## 2019-01-05 DIAGNOSIS — Z794 Long term (current) use of insulin: Secondary | ICD-10-CM | POA: Diagnosis not present

## 2019-01-05 DIAGNOSIS — I25708 Atherosclerosis of coronary artery bypass graft(s), unspecified, with other forms of angina pectoris: Secondary | ICD-10-CM

## 2019-01-05 DIAGNOSIS — E1142 Type 2 diabetes mellitus with diabetic polyneuropathy: Secondary | ICD-10-CM | POA: Insufficient documentation

## 2019-01-05 DIAGNOSIS — Z6841 Body Mass Index (BMI) 40.0 and over, adult: Secondary | ICD-10-CM | POA: Diagnosis not present

## 2019-01-05 DIAGNOSIS — K219 Gastro-esophageal reflux disease without esophagitis: Secondary | ICD-10-CM | POA: Diagnosis not present

## 2019-01-05 DIAGNOSIS — Z7982 Long term (current) use of aspirin: Secondary | ICD-10-CM | POA: Insufficient documentation

## 2019-01-05 DIAGNOSIS — Z951 Presence of aortocoronary bypass graft: Secondary | ICD-10-CM | POA: Diagnosis not present

## 2019-01-05 HISTORY — PX: LEFT HEART CATH AND CORS/GRAFTS ANGIOGRAPHY: CATH118250

## 2019-01-05 LAB — CBC
HCT: 39 % (ref 39.0–52.0)
Hemoglobin: 13.2 g/dL (ref 13.0–17.0)
MCH: 30.8 pg (ref 26.0–34.0)
MCHC: 33.8 g/dL (ref 30.0–36.0)
MCV: 90.9 fL (ref 80.0–100.0)
Platelets: 131 10*3/uL — ABNORMAL LOW (ref 150–400)
RBC: 4.29 MIL/uL (ref 4.22–5.81)
RDW: 13.7 % (ref 11.5–15.5)
WBC: 6.9 10*3/uL (ref 4.0–10.5)
nRBC: 0 % (ref 0.0–0.2)

## 2019-01-05 LAB — BASIC METABOLIC PANEL
Anion gap: 8 (ref 5–15)
BUN: 29 mg/dL — ABNORMAL HIGH (ref 8–23)
CO2: 26 mmol/L (ref 22–32)
Calcium: 9.6 mg/dL (ref 8.9–10.3)
Chloride: 106 mmol/L (ref 98–111)
Creatinine, Ser: 1.58 mg/dL — ABNORMAL HIGH (ref 0.61–1.24)
GFR calc Af Amer: 50 mL/min — ABNORMAL LOW (ref >60)
GFR calc non Af Amer: 43 mL/min — ABNORMAL LOW (ref >60)
Glucose, Bld: 205 mg/dL — ABNORMAL HIGH (ref 70–99)
Potassium: 5 mmol/L (ref 3.5–5.1)
Sodium: 140 mmol/L (ref 135–145)

## 2019-01-05 LAB — GLUCOSE, CAPILLARY: Glucose-Capillary: 172 mg/dL — ABNORMAL HIGH (ref 70–99)

## 2019-01-05 SURGERY — LEFT HEART CATH AND CORS/GRAFTS ANGIOGRAPHY
Anesthesia: LOCAL

## 2019-01-05 MED ORDER — METFORMIN HCL 500 MG PO TABS: 500.0000 mg | ORAL_TABLET | Freq: Two times a day (BID) | ORAL | Status: DC

## 2019-01-05 MED ORDER — HYDRALAZINE HCL 20 MG/ML IJ SOLN
INTRAMUSCULAR | Status: AC
  Filled 2019-01-05: qty 1

## 2019-01-05 MED ORDER — FENTANYL CITRATE (PF) 100 MCG/2ML IJ SOLN
INTRAMUSCULAR | Status: AC
  Filled 2019-01-05: qty 2

## 2019-01-05 MED ORDER — ASPIRIN 81 MG PO CHEW: 81.0000 mg | CHEWABLE_TABLET | ORAL | Status: DC

## 2019-01-05 MED ORDER — VERAPAMIL HCL 2.5 MG/ML IV SOLN
INTRAVENOUS | Status: AC
  Filled 2019-01-05: qty 2

## 2019-01-05 MED ORDER — HEPARIN (PORCINE) IN NACL 1000-0.9 UT/500ML-% IV SOLN
INTRAVENOUS | Status: AC
  Filled 2019-01-05: qty 1000

## 2019-01-05 MED ORDER — SODIUM CHLORIDE 0.9 % IV SOLN: 250.0000 mL | INTRAVENOUS | Status: DC | PRN

## 2019-01-05 MED ORDER — HEPARIN SODIUM (PORCINE) 1000 UNIT/ML IJ SOLN
INTRAMUSCULAR | Status: DC | PRN
  Administered 2019-01-05: 4000 [IU] via INTRAVENOUS

## 2019-01-05 MED ORDER — HYDRALAZINE HCL 25 MG PO TABS: 25.0000 mg | ORAL_TABLET | Freq: Three times a day (TID) | ORAL | 2 refills | Status: DC

## 2019-01-05 MED ORDER — SIMVASTATIN 40 MG PO TABS: 40.0000 mg | ORAL_TABLET | Freq: Every day | ORAL | 3 refills | Status: DC

## 2019-01-05 MED ORDER — MIDAZOLAM HCL 2 MG/2ML IJ SOLN
INTRAMUSCULAR | Status: DC | PRN
  Administered 2019-01-05: 2 mg via INTRAVENOUS

## 2019-01-05 MED ORDER — IOHEXOL 350 MG/ML SOLN
INTRAVENOUS | Status: DC | PRN
  Administered 2019-01-05: 140 mL via INTRAVENOUS

## 2019-01-05 MED ORDER — SODIUM CHLORIDE 0.9% FLUSH: 3.0000 mL | INTRAVENOUS | Status: DC | PRN

## 2019-01-05 MED ORDER — ASPIRIN 81 MG PO CHEW
81.0000 mg | CHEWABLE_TABLET | ORAL | Status: AC
  Administered 2019-01-05: 81 mg via ORAL
  Filled 2019-01-05: qty 1

## 2019-01-05 MED ORDER — FENTANYL CITRATE (PF) 100 MCG/2ML IJ SOLN
INTRAMUSCULAR | Status: DC | PRN
  Administered 2019-01-05: 25 ug via INTRAVENOUS

## 2019-01-05 MED ORDER — SODIUM CHLORIDE 0.9 % WEIGHT BASED INFUSION: 3.0000 mL/kg/h | INTRAVENOUS | Status: DC

## 2019-01-05 MED ORDER — SODIUM CHLORIDE 0.9 % WEIGHT BASED INFUSION: 1.0000 mL/kg/h | INTRAVENOUS | Status: DC

## 2019-01-05 MED ORDER — MIDAZOLAM HCL 2 MG/2ML IJ SOLN
INTRAMUSCULAR | Status: AC
  Filled 2019-01-05: qty 2

## 2019-01-05 MED ORDER — FENTANYL CITRATE (PF) 100 MCG/2ML IJ SOLN
INTRAMUSCULAR | Status: DC | PRN
  Administered 2019-01-05: 50 ug via INTRAVENOUS

## 2019-01-05 MED ORDER — SODIUM CHLORIDE 0.9 % WEIGHT BASED INFUSION
1.0000 mL/kg/h | INTRAVENOUS | Status: DC
  Administered 2019-01-05: 1 mL/kg/h via INTRAVENOUS

## 2019-01-05 MED ORDER — LIDOCAINE HCL (PF) 1 % IJ SOLN
INTRAMUSCULAR | Status: AC
  Filled 2019-01-05: qty 30

## 2019-01-05 MED ORDER — SODIUM CHLORIDE 0.9 % WEIGHT BASED INFUSION
3.0000 mL/kg/h | INTRAVENOUS | Status: AC
  Administered 2019-01-05: 3 mL/kg/h via INTRAVENOUS

## 2019-01-05 MED ORDER — HYDRALAZINE HCL 20 MG/ML IJ SOLN
INTRAMUSCULAR | Status: DC | PRN
  Administered 2019-01-05: 10 mg via INTRAVENOUS

## 2019-01-05 MED ORDER — CLOPIDOGREL BISULFATE 75 MG PO TABS: 75.0000 mg | ORAL_TABLET | Freq: Every day | ORAL | 11 refills | Status: DC

## 2019-01-05 MED ORDER — HEPARIN (PORCINE) IN NACL 1000-0.9 UT/500ML-% IV SOLN
INTRAVENOUS | Status: DC | PRN
  Administered 2019-01-05 (×2): 500 mL

## 2019-01-05 MED ORDER — SODIUM CHLORIDE 0.9% FLUSH: 3.0000 mL | Freq: Two times a day (BID) | INTRAVENOUS | Status: DC

## 2019-01-05 MED ORDER — VERAPAMIL HCL 2.5 MG/ML IV SOLN
INTRAVENOUS | Status: DC | PRN
  Administered 2019-01-05: 15:00:00 10 mL via INTRA_ARTERIAL

## 2019-01-05 MED ORDER — LIDOCAINE HCL (PF) 1 % IJ SOLN
INTRAMUSCULAR | Status: DC | PRN
  Administered 2019-01-05: 2 mL via INTRADERMAL

## 2019-01-05 SURGICAL SUPPLY — 17 items
CATH INFINITI 5 FR AL2 (CATHETERS) ×2 IMPLANT
CATH INFINITI 5 FR AR1 MOD (CATHETERS) ×2 IMPLANT
CATH INFINITI 5 FR IM (CATHETERS) ×2 IMPLANT
CATH INFINITI 5 FR LCB (CATHETERS) ×2 IMPLANT
CATH INFINITI 5 FR RCB (CATHETERS) ×2 IMPLANT
CATH OPTITORQUE TIG 4.0 5F (CATHETERS) ×2 IMPLANT
COVER DOME SNAP 22 D (MISCELLANEOUS) ×2 IMPLANT
COVER PRB 48X5XTLSCP FOLD TPE (BAG) ×1 IMPLANT
COVER PROBE 5X48 (BAG) ×1
DEVICE RAD COMP TR BAND LRG (VASCULAR PRODUCTS) ×2 IMPLANT
GLIDESHEATH SLEND A-KIT 6F 22G (SHEATH) ×2 IMPLANT
GUIDEWIRE INQWIRE 1.5J.035X260 (WIRE) ×2 IMPLANT
INQWIRE 1.5J .035X260CM (WIRE) ×4
KIT HEART LEFT (KITS) ×2 IMPLANT
PACK CARDIAC CATHETERIZATION (CUSTOM PROCEDURE TRAY) ×2 IMPLANT
TRANSDUCER W/STOPCOCK (MISCELLANEOUS) ×2 IMPLANT
TUBING CIL FLEX 10 FLL-RA (TUBING) ×2 IMPLANT

## 2019-01-05 NOTE — Discharge Instructions (Signed)
Drink plenty of fluids over next 48 hours and keep left wrist elevated at heart level for 24 hours Hold Metformin for 48 hours. May resume Friday 8/7  Radial Site Care  This sheet gives you information about how to care for yourself after your procedure. Your health care provider may also give you more specific instructions. If you have problems or questions, contact your health care provider. What can I expect after the procedure? After the procedure, it is common to have:  Bruising and tenderness at the catheter insertion area. Follow these instructions at home: Medicines  Take over-the-counter and prescription medicines only as told by your health care provider. Insertion site care  Follow instructions from your health care provider about how to take care of your insertion site. Make sure you: ? Wash your hands with soap and water before you change your bandage (dressing). If soap and water are not available, use hand sanitizer. ? Remove your dressing as told by your health care provider in 24-48 hours  Check your insertion site every day for signs of infection. Check for: ? Redness, swelling, or pain. ? Fluid or blood. ? Pus or a bad smell. ? Warmth.  Do not take baths, swim, or use a hot tub for 5 days.  You may shower 24-48 hours after the procedure. ? Remove the dressing and gently wash the site with plain soap and water. ? Pat the area dry with a clean towel. ? Do not rub the site. That could cause bleeding.  Do not apply powder or lotion to the site. Activity   For 24 hours after the procedure, or as directed by your health care provider: ? Do not flex or bend the affected arm. ? Do not push or pull heavy objects with the affected arm. ? Do not drive yourself home from the hospital or clinic. You may drive 24 hours after the procedure. ? Do not operate machinery or power tools.  Do not lift anything that is heavier than 10 lb for 5 days.  Ask your health care  provider when it is okay to: ? Return to work or school. ? Resume usual physical activities or sports. ? Resume sexual activity. General instructions  If the catheter site starts to bleed, raise your arm and put firm pressure on the site. If the bleeding does not stop, get help right away. This is a medical emergency.  If you went home on the same day as your procedure, a responsible adult should be with you for the first 24 hours after you arrive home.  Keep all follow-up visits as told by your health care provider. This is important. Contact a health care provider if:  You have a fever.  You have redness, swelling, or yellow drainage around your insertion site. Get help right away if:  You have unusual pain at the radial site.  The catheter insertion area swells very fast.  The insertion area is bleeding, and the bleeding does not stop when you hold steady pressure on the area.  Your arm or hand becomes pale, cool, tingly, or numb. These symptoms may represent a serious problem that is an emergency. Do not wait to see if the symptoms will go away. Get medical help right away. Call your local emergency services (911 in the [place] Do not drive yourself to the hospital. Summary  After the procedure, it is common to have bruising and tenderness at the site.  Follow instructions from your health care provider about  how to take care of your radial site wound. Check the wound every day for signs of infection.  Do not lift anything that is heavier than 10 lb for 5 days.   This information is not intended to replace advice given to you by your health care provider. Make sure you discuss any questions you have with your health care provider. Document Released: 06/22/2010 Document Revised: 06/25/2017 Document Reviewed: 06/25/2017 Elsevier Patient Education  2020 [employer].

## 2019-01-05 NOTE — H&P (View-Only) (Signed)
Primary Physician:  Harvie Junior, MD   Patient ID: David Burton, male    DOB: [birth year], 73 y.o.   MRN: [id number]  Subjective:   CC: Chest pain  HPI: David Burton  is a 73 y.o. male  with history of NSTEMI in Dec 2018, s/p CABG x 4 on 05/08/2017 (LIMA to LAD, SVG to PDA, SVG to Diagonal, SVG to Ramus. Surgery done at [facility], [place]), diabetes mellitus type 2 with peripheral neuropathy, hypertension and hypercholesterolemia.   He has noticed over the last month, he has exertional chest pain and dyspnea on exertion that is resolved with nitroglycerin. Symptoms do not occur daily, but notices them when doing activities outside of his normal activities and particularly with walking uphill. Chest pain makes him stop and rest and if does not improve with resting, he takes nitroglycerin that resolves chest pain almost immediately. He also notices shortness of breath with exertion that he attributes to his weight.  He underwent nuclear stress testing in May 2019 that revealed mild intensity ischemia, considered intermediate risk study. Echo also at that time showed LVEF of 45%. Medical management was recommended in view that he was without chest pain.   He does report that diabetes is uncontrolled with BS running in the 300's. He reports that PCP has recently made changes to his insulin. States that hypertension and hyperlipidemia is well controlled.   Past Medical History:  Diagnosis Date  . Bell palsy   . CKD (chronic kidney disease), stage III ([place]) 12/16/2018  . Coronary artery disease involving native coronary artery of native heart with unstable angina pectoris ([place]) 12/16/2018  . Diabetes mellitus   . Diabetes mellitus without complication ([place])   . GERD (gastroesophageal reflux disease)   . Hypertension   . Hypertension with heart disease 12/16/2018  . Myocardial infarction ([place])   . Pure hypercholesterolemia 12/16/2018  . Stroke ([place])   . Type 2 diabetes, controlled,  with peripheral neuropathy ([place]) 12/16/2018    Past Surgical History:  Procedure Laterality Date  . CARDIAC SURGERY    . COLONOSCOPY WITH PROPOFOL N/A 01/02/2018   Procedure: COLONOSCOPY WITH PROPOFOL;  Surgeon: Carol Ada, MD;  Location: [facility];  Service: Endoscopy;  Laterality: N/A;  . POLYPECTOMY  01/02/2018   Procedure: POLYPECTOMY;  Surgeon: Carol Ada, MD;  Location: [facility];  Service: Endoscopy;;  . stents      Social History   Socioeconomic History  . Marital status: Single    Spouse name: Not on file  . Number of children: 3  . Years of education: Not on file  . Highest education level: Not on file  Occupational History  . Not on file  Social Needs  . Financial resource strain: Not on file  . Food insecurity    Worry: Not on file    Inability: Not on file  . Transportation needs    Medical: Not on file    Non-medical: Not on file  Tobacco Use  . Smoking status: Never Smoker  . Smokeless tobacco: Never Used  Substance and Sexual Activity  . Alcohol use: Yes    Comment: 2/week, whiskey with Ginger[place]ale  . Drug use: No  . Sexual activity: Never  Lifestyle  . Physical activity    Days per week: Not on file    Minutes per session: Not on file  . Stress: Not on file  Relationships  . Social connections    Talks on phone: Not on file  Gets together: Not on file    Attends religious service: Not on file    Active member of club or organization: Not on file    Attends meetings of clubs or organizations: Not on file    Relationship status: Not on file  . Intimate partner violence    Fear of current or ex partner: Not on file    Emotionally abused: Not on file    Physically abused: Not on file    Forced sexual activity: Not on file  Other Topics Concern  . Not on file  Social History Narrative   ** Merged History Encounter **        Review of Systems  Constitution: Negative for decreased appetite, malaise/fatigue, weight gain and weight  loss.  Eyes: Negative for visual disturbance.  Cardiovascular: Positive for chest pain and dyspnea on exertion. Negative for claudication, leg swelling, orthopnea, palpitations and syncope.  Respiratory: Negative for hemoptysis and wheezing.   Endocrine: Negative for cold intolerance and heat intolerance.  Hematologic/Lymphatic: Does not bruise/bleed easily.  Skin: Negative for nail changes.  Musculoskeletal: Negative for muscle weakness and myalgias.  Gastrointestinal: Positive for abdominal pain (left upper abd pain-follows Dr. Benson Norway). Negative for change in bowel habit, nausea and vomiting.  Neurological: Negative for difficulty with concentration, dizziness, focal weakness and headaches.  Psychiatric/Behavioral: Negative for altered mental status and suicidal ideas.  All other systems reviewed and are negative.     Objective:  Blood pressure (!) 155/85, pulse (!) 58, temperature (!) 96.3 F (35.7 C), temperature source Temporal, resp. rate 20, height 5\' 6"  (1.676 m), weight 113.4 kg, SpO2 100 %. Body mass index is 40.35 kg/m.    Physical Exam  Constitutional: He is oriented to person, place, and time. Vital signs are normal. He appears well-developed and well-nourished.  Morbidly obese  HENT:  Head: Normocephalic and atraumatic.  Neck: Normal range of motion.  Cardiovascular: Normal rate, regular rhythm, normal heart sounds and intact distal pulses.  Pulmonary/Chest: Effort normal and breath sounds normal. No accessory muscle usage. No respiratory distress.  Sternotomy scar from CABG  Abdominal: Soft. Bowel sounds are normal.  Musculoskeletal: Normal range of motion.  Neurological: He is alert and oriented to person, place, and time.  Skin: Skin is warm and dry.  Vitals reviewed.  Radiology: No results found.  Laboratory examination:    CMP Latest Ref Rng & Units 01/05/2019 12/21/2018 06/16/2018  Glucose 70 - 99 mg/dL 205(H) 214(H) 120(H)  BUN 8 - 23 mg/dL 29(H) 37(H) 21   Creatinine 0.61 - 1.24 mg/dL 1.58(H) 2.00(H) 1.65(H)  Sodium 135 - 145 mmol/L 140 140 142  Potassium 3.5 - 5.1 mmol/L 5.0 4.8 4.2  Chloride 98 - 111 mmol/L 106 102 113(H)  CO2 22 - 32 mmol/L 26 21 21(L)  Calcium 8.9 - 10.3 mg/dL 9.6 10.2 8.8(L)  Total Protein 6.5 - 8.1 g/dL - - 6.6  Total Bilirubin 0.3 - 1.2 mg/dL - - 0.7  Alkaline Phos 38 - 126 U/L - - 48  AST 15 - 41 U/L - - 23  ALT 0 - 44 U/L - - 23   CBC Latest Ref Rng & Units 01/05/2019 06/16/2018 02/28/2018  WBC 4.0 - 10.5 K/uL 6.9 12.2(H) 9.0  Hemoglobin 13.0 - 17.0 g/dL 13.2 15.3 15.5  Hematocrit 39.0 - 52.0 % 39.0 46.0 46.8  Platelets 150 - 400 K/uL 131(L) 125(L) 123(L)   Lipid Panel  No results found for: CHOL, TRIG, HDL, CHOLHDL, VLDL, LDLCALC, LDLDIRECT HEMOGLOBIN A1C  No results found for: HGBA1C, MPG TSH No results for input(s): TSH in the last 8760 hours.  PRN Meds:. Medications Discontinued During This Encounter  Medication Reason  . 0.9% sodium chloride infusion   . 0.9% sodium chloride infusion   . aspirin chewable tablet 81 mg    Current Meds  Medication Sig  . aspirin EC 81 MG tablet Take 81 mg by mouth daily.  . furosemide (LASIX) 20 MG tablet Take 20 mg by mouth 2 (two) times daily.   . insulin aspart (NOVOLOG) 100 UNIT/ML FlexPen Inject 10 Units into the skin daily.  . insulin detemir (LEVEMIR) 100 UNIT/ML injection Inject 20 Units into the skin at bedtime.   . metFORMIN (GLUCOPHAGE) 500 MG tablet Take 500 mg by mouth 2 (two) times a day.   . metoprolol tartrate (LOPRESSOR) 50 MG tablet Take 50 mg by mouth daily.  . nitroGLYCERIN (NITROSTAT) 0.4 MG SL tablet Place 1 tablet (0.4 mg total) under the tongue every 5 (five) minutes as needed for chest pain.  Marland Kitchen omeprazole (PRILOSEC) 20 MG capsule Take 1 capsule (20 mg total) by mouth daily.  . simvastatin (ZOCOR) 40 MG tablet daily.    Cardiac Studies:    Lexiscan myoview stress test 10/10/2017: 1. Lexiscan stress test was performed. Exercise capacity  was not assessed. Stress symptoms included dizziness. Normal blood pressure. The resting electrocardiogram demonstrated normal sinus rhythm, normal resting conduction, no resting arrhythmias and normal rest repolarization. Stress EKG is non diagnostic for ischemia as it is a pharmacologic stress. 2. The overall quality of the study is good. Left ventricular cavity is noted to be normal on the rest and stress studies. Gated SPECT images reveal normal myocardial thickening and wall motion, except mild inferior hypokinesis. The left ventricular ejection fraction was calculated or visually estimated to be 53%. SPECT stress images demonstrate medium size, moderate intensity perfusion defect in basal to apical inferior, inferolateral myocardium. SPECT rest images demonstrate medium size, mild intensity perfusion defect in basal to apical inferior myocardium. This suggests inferior infarct with periinfarct mild ischemia, as well as inferolateral ischemia. (LCx/PDA territory). 3. Intermediate risk study.  Echocardiogram  10/07/2017: Left ventricle cavity is normal in size. Concentric remodeling of the left ventricle. Mild decrease in global wall motion. Visual EF is 45-50%. Doppler evidence of grade I (impaired) diastolic dysfunction, normal LAP. Moderate aortic valve leaflet calcification. Trace aortic valve stenosis. Mild tricuspid regurgitation. No evidence of pulmonary hypertension.  Assessment:   No diagnosis found.  EKG 12/18/2018: Normal sinus rhythm at 68 bpm, left anterior hemi-block, IRBBB. No evidence of ischemia. Unchanged from EKG in 03/06/2018.   Recommendations:   Over the last 1 month, patient has had chest pain and shortness of breath with activity outside of his normal activities that is relieved with nitroglycerin.  He has had intermediate risk stress test and May 2019.  LVEF was mildly reduced at that time.  In view of his history, risk factors, and angina, feel that he should proceed  with coronary angiogram for further evaluation.  Schedule for cardiac catheterization, and possible angioplasty. We discussed regarding risks, benefits, alternatives to this including stress testing, CTA and continued medical therapy. Patient wants to proceed. Understands <1-2% risk of death, stroke, MI, urgent CABG, bleeding, infection, renal failure but not limited to these.  He has uncontrolled type 2 diabetes that is being managed by his PCP and has had recent changes to his insulin.  He also has stage III chronic kidney disease and will need  adequate hydration prior to procedure.  I will start him on Imdur 60 mg daily for angina.  Will obtain echocardiogram in the next 1 week and plan for coronary angiogram in 2 weeks.  No acute changes were noted to his EKG.  Blood pressure is well controlled, continue with other medical therapy.  No evidence of acute decompensated heart failure by clinical exam.  We will see him back after the procedure for follow-up.  Advised him to take it easy until his procedure and to contact us for any worsening symptoms.  No change in symptoms today. I have reviewed his labs and also medications: Proceed with coronary angiogram.  ARB on hold prior to cath. Will limit contrast.   Adrian Prows, MD, [facility] 01/05/2019, 2:55 PM [facility] [place] Pager: [phone] Office: [phone] If no answer Cell [phone]

## 2019-01-05 NOTE — Progress Notes (Signed)
Pt has no one to stay with him overnight. Anderson Malta O'Neal notified to inform Dr.Ganji.

## 2019-01-05 NOTE — Progress Notes (Addendum)
Primary Physician:  Harvie Junior, MD   Patient ID: David Burton, male    DOB: [birth year], 73 y.o.   MRN: [id number]  Subjective:   CC: Chest pain  HPI: [name]  is a 73 y.o. male  with history of NSTEMI in Dec 2018, s/p CABG x 4 on 05/08/2017 (LIMA to LAD, SVG to PDA, SVG to Diagonal, SVG to Ramus. Surgery done at [facility], [place]), diabetes mellitus type 2 with peripheral neuropathy, hypertension and hypercholesterolemia.   He has noticed over the last month, he has exertional chest pain and dyspnea on exertion that is resolved with nitroglycerin. Symptoms do not occur daily, but notices them when doing activities outside of his normal activities and particularly with walking uphill. Chest pain makes him stop and rest and if does not improve with resting, he takes nitroglycerin that resolves chest pain almost immediately. He also notices shortness of breath with exertion that he attributes to his weight.  He underwent nuclear stress testing in May 2019 that revealed mild intensity ischemia, considered intermediate risk study. Echo also at that time showed LVEF of 45%. Medical management was recommended in view that he was without chest pain.   He does report that diabetes is uncontrolled with BS running in the 300's. He reports that PCP has recently made changes to his insulin. States that hypertension and hyperlipidemia is well controlled.   Past Medical History:  Diagnosis Date  . Bell palsy   . CKD (chronic kidney disease), stage III ([place]) 12/16/2018  . Coronary artery disease involving native coronary artery of native heart with unstable angina pectoris ([place]) 12/16/2018  . Diabetes mellitus   . Diabetes mellitus without complication ([place])   . GERD (gastroesophageal reflux disease)   . Hypertension   . Hypertension with heart disease 12/16/2018  . Myocardial infarction ([place])   . Pure hypercholesterolemia 12/16/2018  . Stroke ([place])   . Type 2 diabetes, controlled,  with peripheral neuropathy ([place]) 12/16/2018    Past Surgical History:  Procedure Laterality Date  . CARDIAC SURGERY    . COLONOSCOPY WITH PROPOFOL N/A 01/02/2018   Procedure: COLONOSCOPY WITH PROPOFOL;  Surgeon: Carol Ada, MD;  [facility];  Service: Endoscopy;  Laterality: N/A;  . POLYPECTOMY  01/02/2018   Procedure: POLYPECTOMY;  Surgeon: Carol Ada, MD;  [facility];  Service: Endoscopy;;  . stents      Social History   Socioeconomic History  . Marital status: Single    Spouse name: Not on file  . Number of children: 3  . Years of education: Not on file  . Highest education level: Not on file  Occupational History  . Not on file  Social Needs  . Financial resource strain: Not on file  . Food insecurity    Worry: Not on file    Inability: Not on file  . Transportation needs    Medical: Not on file    Non-medical: Not on file  Tobacco Use  . Smoking status: Never Smoker  . Smokeless tobacco: Never Used  Substance and Sexual Activity  . Alcohol use: Yes    Comment: 2/week, whiskey with [place]  . Drug use: No  . Sexual activity: Never  Lifestyle  . Physical activity    Days per week: Not on file    Minutes per session: Not on file  . Stress: Not on file  Relationships  . Social connections    Talks on phone: Not on file  Gets together: Not on file    Attends religious service: Not on file    Active member of club or organization: Not on file    Attends meetings of clubs or organizations: Not on file    Relationship status: Not on file  . Intimate partner violence    Fear of current or ex partner: Not on file    Emotionally abused: Not on file    Physically abused: Not on file    Forced sexual activity: Not on file  Other Topics Concern  . Not on file  Social History Narrative   ** Merged History Encounter **        Review of Systems  Constitution: Negative for decreased appetite, malaise/fatigue, weight gain and weight  loss.  Eyes: Negative for visual disturbance.  Cardiovascular: Positive for chest pain and dyspnea on exertion. Negative for claudication, leg swelling, orthopnea, palpitations and syncope.  Respiratory: Negative for hemoptysis and wheezing.   Endocrine: Negative for cold intolerance and heat intolerance.  Hematologic/Lymphatic: Does not bruise/bleed easily.  Skin: Negative for nail changes.  Musculoskeletal: Negative for muscle weakness and myalgias.  Gastrointestinal: Positive for abdominal pain (left upper abd pain-follows Dr. Benson Norway). Negative for change in bowel habit, nausea and vomiting.  Neurological: Negative for difficulty with concentration, dizziness, focal weakness and headaches.  Psychiatric/Behavioral: Negative for altered mental status and suicidal ideas.  All other systems reviewed and are negative.     Objective:  Blood pressure (!) 155/85, pulse (!) 58, temperature (!) 96.3 F (35.7 C), temperature source Temporal, resp. rate 20, height 5\' 6"  (1.676 m), weight 113.4 kg, SpO2 100 %. Body mass index is 40.35 kg/m.    Physical Exam  Constitutional: He is oriented to person, place, and time. Vital signs are normal. He appears well-developed and well-nourished.  Morbidly obese  HENT:  Head: Normocephalic and atraumatic.  Neck: Normal range of motion.  Cardiovascular: Normal rate, regular rhythm, normal heart sounds and intact distal pulses.  Pulmonary/Chest: Effort normal and breath sounds normal. No accessory muscle usage. No respiratory distress.  Sternotomy scar from CABG  Abdominal: Soft. Bowel sounds are normal.  Musculoskeletal: Normal range of motion.  Neurological: He is alert and oriented to person, place, and time.  Skin: Skin is warm and dry.  Vitals reviewed.  Radiology: No results found.  Laboratory examination:    CMP Latest Ref Rng & Units 01/05/2019 12/21/2018 06/16/2018  Glucose 70 - 99 mg/dL 205(H) 214(H) 120(H)  BUN 8 - 23 mg/dL 29(H) 37(H) 21   Creatinine 0.61 - 1.24 mg/dL 1.58(H) 2.00(H) 1.65(H)  Sodium 135 - 145 mmol/L 140 140 142  Potassium 3.5 - 5.1 mmol/L 5.0 4.8 4.2  Chloride 98 - 111 mmol/L 106 102 113(H)  CO2 22 - 32 mmol/L 26 21 21(L)  Calcium 8.9 - 10.3 mg/dL 9.6 10.2 8.8(L)  Total Protein 6.5 - 8.1 g/dL - - 6.6  Total Bilirubin 0.3 - 1.2 mg/dL - - 0.7  Alkaline Phos 38 - 126 U/L - - 48  AST 15 - 41 U/L - - 23  ALT 0 - 44 U/L - - 23   CBC Latest Ref Rng & Units 01/05/2019 06/16/2018 02/28/2018  WBC 4.0 - 10.5 K/uL 6.9 12.2(H) 9.0  Hemoglobin 13.0 - 17.0 g/dL 13.2 15.3 15.5  Hematocrit 39.0 - 52.0 % 39.0 46.0 46.8  Platelets 150 - 400 K/uL 131(L) 125(L) 123(L)   Lipid Panel  No results found for: CHOL, TRIG, HDL, CHOLHDL, VLDL, LDLCALC, LDLDIRECT HEMOGLOBIN A1C  No results found for: HGBA1C, MPG TSH No results for input(s): TSH in the last 8760 hours.  PRN Meds:. Medications Discontinued During This Encounter  Medication Reason  . 0.9% sodium chloride infusion   . 0.9% sodium chloride infusion   . aspirin chewable tablet 81 mg    Current Meds  Medication Sig  . aspirin EC 81 MG tablet Take 81 mg by mouth daily.  . furosemide (LASIX) 20 MG tablet Take 20 mg by mouth 2 (two) times daily.   . insulin aspart (NOVOLOG) 100 UNIT/ML FlexPen Inject 10 Units into the skin daily.  . insulin detemir (LEVEMIR) 100 UNIT/ML injection Inject 20 Units into the skin at bedtime.   . metFORMIN (GLUCOPHAGE) 500 MG tablet Take 500 mg by mouth 2 (two) times a day.   . metoprolol tartrate (LOPRESSOR) 50 MG tablet Take 50 mg by mouth daily.  . nitroGLYCERIN (NITROSTAT) 0.4 MG SL tablet Place 1 tablet (0.4 mg total) under the tongue every 5 (five) minutes as needed for chest pain.  Marland Kitchen omeprazole (PRILOSEC) 20 MG capsule Take 1 capsule (20 mg total) by mouth daily.  . simvastatin (ZOCOR) 40 MG tablet daily.    Cardiac Studies:    Lexiscan myoview stress test 10/10/2017: 1. Lexiscan stress test was performed. Exercise capacity  was not assessed. Stress symptoms included dizziness. Normal blood pressure. The resting electrocardiogram demonstrated normal sinus rhythm, normal resting conduction, no resting arrhythmias and normal rest repolarization. Stress EKG is non diagnostic for ischemia as it is a pharmacologic stress. 2. The overall quality of the study is good. Left ventricular cavity is noted to be normal on the rest and stress studies. Gated SPECT images reveal normal myocardial thickening and wall motion, except mild inferior hypokinesis. The left ventricular ejection fraction was calculated or visually estimated to be 53%. SPECT stress images demonstrate medium size, moderate intensity perfusion defect in basal to apical inferior, inferolateral myocardium. SPECT rest images demonstrate medium size, mild intensity perfusion defect in basal to apical inferior myocardium. This suggests inferior infarct with periinfarct mild ischemia, as well as inferolateral ischemia. (LCx/PDA territory). 3. Intermediate risk study.  Echocardiogram  10/07/2017: Left ventricle cavity is normal in size. Concentric remodeling of the left ventricle. Mild decrease in global wall motion. Visual EF is 45-50%. Doppler evidence of grade I (impaired) diastolic dysfunction, normal LAP. Moderate aortic valve leaflet calcification. Trace aortic valve stenosis. Mild tricuspid regurgitation. No evidence of pulmonary hypertension.  Assessment:   No diagnosis found.  EKG 12/18/2018: Normal sinus rhythm at 68 bpm, left anterior hemi-block, IRBBB. No evidence of ischemia. Unchanged from EKG in 03/06/2018.   Recommendations:   Over the last 1 month, patient has had chest pain and shortness of breath with activity outside of his normal activities that is relieved with nitroglycerin.  He has had intermediate risk stress test and May 2019.  LVEF was mildly reduced at that time.  In view of his history, risk factors, and angina, feel that he should proceed  with coronary angiogram for further evaluation.  Schedule for cardiac catheterization, and possible angioplasty. We discussed regarding risks, benefits, alternatives to this including stress testing, CTA and continued medical therapy. Patient wants to proceed. Understands <1-2% risk of death, stroke, MI, urgent CABG, bleeding, infection, renal failure but not limited to these.  He has uncontrolled type 2 diabetes that is being managed by his PCP and has had recent changes to his insulin.  He also has stage III chronic kidney disease and will need  adequate hydration prior to procedure.  I will start him on Imdur 60 mg daily for angina.  Will obtain echocardiogram in the next 1 week and plan for coronary angiogram in 2 weeks.  No acute changes were noted to his EKG.  Blood pressure is well controlled, continue with other medical therapy.  No evidence of acute decompensated heart failure by clinical exam.  We will see him back after the procedure for follow-up.  Advised him to take it easy until his procedure and to contact us for any worsening symptoms.  No change in symptoms today. I have reviewed his labs and also medications: Proceed with coronary angiogram.  ARB on hold prior to cath. Will limit contrast.   Adrian Prows, MD, [facility] 01/05/2019, 2:55 PM [facility] [place] Pager: [phone] Office: [phone] If no answer Cell [phone]

## 2019-01-05 NOTE — Interval H&P Note (Signed)
History and Physical Interval Note:  01/05/2019 2:57 PM  David Burton  has presented today for surgery, with the diagnosis of CAD.  The various methods of treatment have been discussed with the patient and family. After consideration of risks, benefits and other options for treatment, the patient has consented to  Procedure(s): LEFT HEART CATH AND CORS/GRAFTS ANGIOGRAPHY (N/A) as a surgical intervention.  The patient's history has been reviewed, patient examined, no change in status, stable for surgery.  I have reviewed the patient's chart and labs.  Questions were answered to the patient's satisfaction.   Symptom Status: Ischemic Symptoms Non-invasive Testing: Intermediate Risk If no or indeterminate stress test, FFR/iFR results in all diseased vessels: N/A Diabetes Mellitus: Yes S/P CABG: Yes Antianginal therapy (number of long-acting drugs): >=2 Patient undergoing renal transplant: No Patient undergoing percutaneous valve procedure: No  LIMA-LAD patent and without significant stenoses Stenosis supplying 1 territory (bypass graft or native artery) other than anterior  PCI: A (8);  Indication 31  CABG: M (5);  Indication 31 Stenoses supplying 2 territories (bypass graft or native artery, either 2 separate vessels or sequential graft supplying 2 territories) not including anterior territory  PCI: A (8);  Indication 34  CABG: M (6);  Indication 34  LIMA-LAD not patent Stenosis supplying 1 territory (bypass graft or native artery)-anterior (LAD) territory  PCI: A (8);  Indication 36  CABG: M (6);  Indication 36 Stenoses supplying 2 territories (bypass graft or native artery, either 2 separate vessels or sequential graft supplying 2 territories)-LAD plus other territory  PCI: A (8);  Indication 39  CABG: A (8);  Indication 39 Stenoses supplying 3 territories (bypass graft or native arteries, separate vessels, sequential grafts, or combination thereof)-LAD plus 2 other territories  PCI: A  (8);  Indication 41  CABG: A (8);  Indication 41  Notes:  A indicates appropriate. M indicates may be appropriate. R indicates rarely appropriate. Number in parentheses is median score for that indication. Reclassify indicates number of functionally diseased vessels should be decreased given negative FFR/iFR. Re-evaluate the scenario interpreting any FFR/iFR negative vessel as being not significantly stenosed.  Journal of the [employer] of Cardiology Mar 2017, 23391; DOI: 10.1016/j.jacc.2017.02.001 [email or web address].full-text.pdf This App  2018 by the Society for Cardiovascular Angiography and Interventions   Adrian Prows

## 2019-01-05 NOTE — H&P (View-Only) (Signed)
Primary Physician:  Harvie Junior, MD   Patient ID: David Burton, male    DOB: 07-13-1945, 73 y.o.   MRN: [id number]  Subjective:   CC: Chest pain  HPI: Stevenson Windmiller  is a 73 y.o. male  with history of NSTEMI in Dec 2018, s/p CABG x 4 on 05/08/2017 (LIMA to LAD, SVG to PDA, SVG to Diagonal, SVG to Ramus. Surgery done at [facility], [place]), diabetes mellitus type 2 with peripheral neuropathy, hypertension and hypercholesterolemia.   He has noticed over the last month, he has exertional chest pain and dyspnea on exertion that is resolved with nitroglycerin. Symptoms do not occur daily, but notices them when doing activities outside of his normal activities and particularly with walking uphill. Chest pain makes him stop and rest and if does not improve with resting, he takes nitroglycerin that resolves chest pain almost immediately. He also notices shortness of breath with exertion that he attributes to his weight.  He underwent nuclear stress testing in May 2019 that revealed mild intensity ischemia, considered intermediate risk study. Echo also at that time showed LVEF of 45%. Medical management was recommended in view that he was without chest pain.   He does report that diabetes is uncontrolled with BS running in the 300's. He reports that PCP has recently made changes to his insulin. States that hypertension and hyperlipidemia is well controlled.   Past Medical History:  Diagnosis Date  . Bell palsy   . CKD (chronic kidney disease), stage III ([place]) 12/16/2018  . Coronary artery disease involving native coronary artery of native heart with unstable angina pectoris ([place]) 12/16/2018  . Diabetes mellitus   . Diabetes mellitus without complication ([place])   . GERD (gastroesophageal reflux disease)   . Hypertension   . Hypertension with heart disease 12/16/2018  . Myocardial infarction ([place])   . Pure hypercholesterolemia 12/16/2018  . Stroke ([place])   . Type 2 diabetes, controlled,  with peripheral neuropathy ([place]) 12/16/2018    Past Surgical History:  Procedure Laterality Date  . CARDIAC SURGERY    . COLONOSCOPY WITH PROPOFOL N/A 01/02/2018   Procedure: COLONOSCOPY WITH PROPOFOL;  Surgeon: Carol Ada, MD;  Location: [facility] ENDOSCOPY;  Service: Endoscopy;  Laterality: N/A;  . POLYPECTOMY  01/02/2018   Procedure: POLYPECTOMY;  Surgeon: Carol Ada, MD;  Location: [facility] ENDOSCOPY;  Service: Endoscopy;;  . stents      Social History   Socioeconomic History  . Marital status: Single    Spouse name: Not on file  . Number of children: 3  . Years of education: Not on file  . Highest education level: Not on file  Occupational History  . Not on file  Social Needs  . Financial resource strain: Not on file  . Food insecurity    Worry: Not on file    Inability: Not on file  . Transportation needs    Medical: Not on file    Non-medical: Not on file  Tobacco Use  . Smoking status: Never Smoker  . Smokeless tobacco: Never Used  Substance and Sexual Activity  . Alcohol use: Yes    Comment: 2/week, whiskey with [place]  . Drug use: No  . Sexual activity: Never  Lifestyle  . Physical activity    Days per week: Not on file    Minutes per session: Not on file  . Stress: Not on file  Relationships  . Social connections    Talks on phone: Not on file  Gets together: Not on file    Attends religious service: Not on file    Active member of club or organization: Not on file    Attends meetings of clubs or organizations: Not on file    Relationship status: Not on file  . Intimate partner violence    Fear of current or ex partner: Not on file    Emotionally abused: Not on file    Physically abused: Not on file    Forced sexual activity: Not on file  Other Topics Concern  . Not on file  Social History Narrative   ** Merged History Encounter **        Review of Systems  Constitution: Negative for decreased appetite, malaise/fatigue, weight gain and weight  loss.  Eyes: Negative for visual disturbance.  Cardiovascular: Positive for chest pain and dyspnea on exertion. Negative for claudication, leg swelling, orthopnea, palpitations and syncope.  Respiratory: Negative for hemoptysis and wheezing.   Endocrine: Negative for cold intolerance and heat intolerance.  Hematologic/Lymphatic: Does not bruise/bleed easily.  Skin: Negative for nail changes.  Musculoskeletal: Negative for muscle weakness and myalgias.  Gastrointestinal: Positive for abdominal pain (left upper abd pain-follows Dr. Benson Norway). Negative for change in bowel habit, nausea and vomiting.  Neurological: Negative for difficulty with concentration, dizziness, focal weakness and headaches.  Psychiatric/Behavioral: Negative for altered mental status and suicidal ideas.  All other systems reviewed and are negative.     Objective:  Blood pressure (!) 155/85, pulse (!) 58, temperature (!) 96.3 F (35.7 C), temperature source Temporal, resp. rate 20, height 5\' 6"  (1.676 m), weight 113.4 kg, SpO2 100 %. Body mass index is 40.35 kg/m.    Physical Exam  Constitutional: He is oriented to person, place, and time. Vital signs are normal. He appears well-developed and well-nourished.  Morbidly obese  HENT:  Head: Normocephalic and atraumatic.  Neck: Normal range of motion.  Cardiovascular: Normal rate, regular rhythm, normal heart sounds and intact distal pulses.  Pulmonary/Chest: Effort normal and breath sounds normal. No accessory muscle usage. No respiratory distress.  Sternotomy scar from CABG  Abdominal: Soft. Bowel sounds are normal.  Musculoskeletal: Normal range of motion.  Neurological: He is alert and oriented to person, place, and time.  Skin: Skin is warm and dry.  Vitals reviewed.  Radiology: No results found.  Laboratory examination:    CMP Latest Ref Rng & Units 01/05/2019 12/21/2018 06/16/2018  Glucose 70 - 99 mg/dL 205(H) 214(H) 120(H)  BUN 8 - 23 mg/dL 29(H) 37(H) 21   Creatinine 0.61 - 1.24 mg/dL 1.58(H) 2.00(H) 1.65(H)  Sodium 135 - 145 mmol/L 140 140 142  Potassium 3.5 - 5.1 mmol/L 5.0 4.8 4.2  Chloride 98 - 111 mmol/L 106 102 113(H)  CO2 22 - 32 mmol/L 26 21 21(L)  Calcium 8.9 - 10.3 mg/dL 9.6 10.2 8.8(L)  Total Protein 6.5 - 8.1 g/dL - - 6.6  Total Bilirubin 0.3 - 1.2 mg/dL - - 0.7  Alkaline Phos 38 - 126 U/L - - 48  AST 15 - 41 U/L - - 23  ALT 0 - 44 U/L - - 23   CBC Latest Ref Rng & Units 01/05/2019 06/16/2018 02/28/2018  WBC 4.0 - 10.5 K/uL 6.9 12.2(H) 9.0  Hemoglobin 13.0 - 17.0 g/dL 13.2 15.3 15.5  Hematocrit 39.0 - 52.0 % 39.0 46.0 46.8  Platelets 150 - 400 K/uL 131(L) 125(L) 123(L)   Lipid Panel  No results found for: CHOL, TRIG, HDL, CHOLHDL, VLDL, LDLCALC, LDLDIRECT HEMOGLOBIN A1C  No results found for: HGBA1C, MPG TSH No results for input(s): TSH in the last 8760 hours.  PRN Meds:. Medications Discontinued During This Encounter  Medication Reason  . 0.9% sodium chloride infusion   . 0.9% sodium chloride infusion   . aspirin chewable tablet 81 mg    Current Meds  Medication Sig  . aspirin EC 81 MG tablet Take 81 mg by mouth daily.  . furosemide (LASIX) 20 MG tablet Take 20 mg by mouth 2 (two) times daily.   . insulin aspart (NOVOLOG) 100 UNIT/ML FlexPen Inject 10 Units into the skin daily.  . insulin detemir (LEVEMIR) 100 UNIT/ML injection Inject 20 Units into the skin at bedtime.   . metFORMIN (GLUCOPHAGE) 500 MG tablet Take 500 mg by mouth 2 (two) times a day.   . metoprolol tartrate (LOPRESSOR) 50 MG tablet Take 50 mg by mouth daily.  . nitroGLYCERIN (NITROSTAT) 0.4 MG SL tablet Place 1 tablet (0.4 mg total) under the tongue every 5 (five) minutes as needed for chest pain.  Marland Kitchen omeprazole (PRILOSEC) 20 MG capsule Take 1 capsule (20 mg total) by mouth daily.  . simvastatin (ZOCOR) 40 MG tablet daily.    Cardiac Studies:    Lexiscan myoview stress test 10/10/2017: 1. Lexiscan stress test was performed. Exercise capacity  was not assessed. Stress symptoms included dizziness. Normal blood pressure. The resting electrocardiogram demonstrated normal sinus rhythm, normal resting conduction, no resting arrhythmias and normal rest repolarization. Stress EKG is non diagnostic for ischemia as it is a pharmacologic stress. 2. The overall quality of the study is good. Left ventricular cavity is noted to be normal on the rest and stress studies. Gated SPECT images reveal normal myocardial thickening and wall motion, except mild inferior hypokinesis. The left ventricular ejection fraction was calculated or visually estimated to be 53%. SPECT stress images demonstrate medium size, moderate intensity perfusion defect in basal to apical inferior, inferolateral myocardium. SPECT rest images demonstrate medium size, mild intensity perfusion defect in basal to apical inferior myocardium. This suggests inferior infarct with periinfarct mild ischemia, as well as inferolateral ischemia. (LCx/PDA territory). 3. Intermediate risk study.  Echocardiogram  10/07/2017: Left ventricle cavity is normal in size. Concentric remodeling of the left ventricle. Mild decrease in global wall motion. Visual EF is 45-50%. Doppler evidence of grade I (impaired) diastolic dysfunction, normal LAP. Moderate aortic valve leaflet calcification. Trace aortic valve stenosis. Mild tricuspid regurgitation. No evidence of pulmonary hypertension.  Assessment:   No diagnosis found.  EKG 12/18/2018: Normal sinus rhythm at 68 bpm, left anterior hemi-block, IRBBB. No evidence of ischemia. Unchanged from EKG in 03/06/2018.   Recommendations:   Over the last 1 month, patient has had chest pain and shortness of breath with activity outside of his normal activities that is relieved with nitroglycerin.  He has had intermediate risk stress test and May 2019.  LVEF was mildly reduced at that time.  In view of his history, risk factors, and angina, feel that he should proceed  with coronary angiogram for further evaluation.  Schedule for cardiac catheterization, and possible angioplasty. We discussed regarding risks, benefits, alternatives to this including stress testing, CTA and continued medical therapy. Patient wants to proceed. Understands <1-2% risk of death, stroke, MI, urgent CABG, bleeding, infection, renal failure but not limited to these.  He has uncontrolled type 2 diabetes that is being managed by his PCP and has had recent changes to his insulin.  He also has stage III chronic kidney disease and will need  adequate hydration prior to procedure.  I will start him on Imdur 60 mg daily for angina.  Will obtain echocardiogram in the next 1 week and plan for coronary angiogram in 2 weeks.  No acute changes were noted to his EKG.  Blood pressure is well controlled, continue with other medical therapy.  No evidence of acute decompensated heart failure by clinical exam.  We will see him back after the procedure for follow-up.  Advised him to take it easy until his procedure and to contact us for any worsening symptoms.  No change in symptoms today. I have reviewed his labs and also medications: Proceed with coronary angiogram.  ARB on hold prior to cath. Will limit contrast.   Adrian Prows, MD, [facility] 01/05/2019, 2:55 PM [facility] [place] Pager: [phone] Office: [phone] If no answer Cell [phone]

## 2019-01-06 ENCOUNTER — Encounter (HOSPITAL_COMMUNITY): Payer: Self-pay | Admitting: Cardiology

## 2019-01-13 ENCOUNTER — Other Ambulatory Visit: Payer: Self-pay

## 2019-01-13 ENCOUNTER — Encounter: Payer: Self-pay | Admitting: Cardiology

## 2019-01-13 ENCOUNTER — Ambulatory Visit (INDEPENDENT_AMBULATORY_CARE_PROVIDER_SITE_OTHER): Payer: Medicare HMO | Admitting: Cardiology

## 2019-01-13 VITALS — BP 194/86 | HR 80 | Ht 66.0 in | Wt 258.0 lb

## 2019-01-13 DIAGNOSIS — Z951 Presence of aortocoronary bypass graft: Secondary | ICD-10-CM | POA: Diagnosis not present

## 2019-01-13 DIAGNOSIS — N183 Chronic kidney disease, stage 3 unspecified: Secondary | ICD-10-CM

## 2019-01-13 DIAGNOSIS — I25118 Atherosclerotic heart disease of native coronary artery with other forms of angina pectoris: Secondary | ICD-10-CM | POA: Diagnosis not present

## 2019-01-13 DIAGNOSIS — I1310 Hypertensive heart and chronic kidney disease without heart failure, with stage 1 through stage 4 chronic kidney disease, or unspecified chronic kidney disease: Secondary | ICD-10-CM | POA: Diagnosis not present

## 2019-01-13 DIAGNOSIS — I119 Hypertensive heart disease without heart failure: Secondary | ICD-10-CM

## 2019-01-13 MED ORDER — AMLODIPINE BESYLATE 5 MG PO TABS: 5.0000 mg | ORAL_TABLET | Freq: Every day | ORAL | 2 refills | Status: DC

## 2019-01-13 MED ORDER — HYDRALAZINE HCL 50 MG PO TABS: 50.0000 mg | ORAL_TABLET | Freq: Three times a day (TID) | ORAL | 2 refills | Status: DC

## 2019-01-13 NOTE — Progress Notes (Signed)
Primary Physician:  Harvie Junior, MD   Patient ID: David Burton, male    DOB: [birth year], 73 y.o.   MRN: [id number]  Subjective:    Chief Complaint  Patient presents with  . Coronary Artery Disease  . Follow-up    HPI: David Burton  is a 73 y.o. male  with history of NSTEMI in Dec 2018, s/p CABG x 4 on 05/08/2017 (LIMA to LAD, SVG to PDA, SVG to Diagonal, SVG to Ramus. Surgery done at [facility], [place]), diabetes mellitus type 2 with peripheral neuropathy, hypertension and hypercholesterolemia.   He underwent nuclear stress testing in May 2019 that revealed mild intensity ischemia, considered intermediate risk study. Echo also at that time showed LVEF of 45%. Medical management was recommended in view that he was without chest pain; however, he was last seen a few weeks ago due to angina. Therefore, he underwent coronary angiogram on 01/05/19 revealing high grade proximal RCA 90-95% stenosis followed by 70-80% stenosis in the in-stent proximal segment, Ramus intermediate with 80-85% stenosis in proximal segment and circumflex had high-grade 99% subtotal occlusion in the ostium. No intervention was performed in view of difficult procedure and renal insufficiency. He now presents for follow up.  Due to renal insufficiency, Irbesartan was discontinued. He is now on hydralazine as well as isosorbide. He states that he is feeling some better with this. He has only had some mild chest pain, but is overall much better. Tolerating medications well. States that right radial access site is healing well without any complications.  He does report that diabetes is uncontrolled with BS running in the 300's. He reports that PCP has recently made changes to his insulin. States that hypertension and hyperlipidemia is well controlled.   Past Medical History:  Diagnosis Date  . Bell palsy   . CKD (chronic kidney disease), stage III ([place]) 12/16/2018  . Coronary artery disease involving native  coronary artery of native heart with unstable angina pectoris ([place]) 12/16/2018  . Diabetes mellitus   . Diabetes mellitus without complication ([place])   . GERD (gastroesophageal reflux disease)   . Hypertension   . Hypertension with heart disease 12/16/2018  . Myocardial infarction ([place])   . Pure hypercholesterolemia 12/16/2018  . Stroke ([place])   . Type 2 diabetes, controlled, with peripheral neuropathy ([place]) 12/16/2018    Past Surgical History:  Procedure Laterality Date  . CARDIAC SURGERY    . COLONOSCOPY WITH PROPOFOL N/A 01/02/2018   Procedure: COLONOSCOPY WITH PROPOFOL;  Surgeon: Carol Ada, MD;  Location: [facility];  Service: Endoscopy;  Laterality: N/A;  . LEFT HEART CATH AND CORS/GRAFTS ANGIOGRAPHY N/A 01/05/2019   Procedure: LEFT HEART CATH AND CORS/GRAFTS ANGIOGRAPHY;  Surgeon: Adrian Prows, MD;  Location: [facility];  Service: Cardiovascular;  Laterality: N/A;  . POLYPECTOMY  01/02/2018   Procedure: POLYPECTOMY;  Surgeon: Carol Ada, MD;  Location: [facility];  Service: Endoscopy;;  . stents      Social History   Socioeconomic History  . Marital status: Single    Spouse name: Not on file  . Number of children: 3  . Years of education: Not on file  . Highest education level: Not on file  Occupational History  . Not on file  Social Needs  . Financial resource strain: Not on file  . Food insecurity    Worry: Not on file    Inability: Not on file  . Transportation needs    Medical: Not on file  Non-medical: Not on file  Tobacco Use  . Smoking status: Former Smoker    Packs/day: 1.00    Years: 5.00    Pack years: 5.00    Types: Cigarettes    Quit date: 2005    Years since quitting: 15.6  . Smokeless tobacco: Never Used  Substance and Sexual Activity  . Alcohol use: Yes    Comment: 2/week, whiskey with [place]  . Drug use: No  . Sexual activity: Never  Lifestyle  . Physical activity    Days per week: Not on file    Minutes per session: Not on  file  . Stress: Not on file  Relationships  . Social Herbalist on phone: Not on file    Gets together: Not on file    Attends religious service: Not on file    Active member of club or organization: Not on file    Attends meetings of clubs or organizations: Not on file    Relationship status: Not on file  . Intimate partner violence    Fear of current or ex partner: Not on file    Emotionally abused: Not on file    Physically abused: Not on file    Forced sexual activity: Not on file  Other Topics Concern  . Not on file  Social History Narrative   ** Merged History Encounter **        Review of Systems  Constitution: Negative for decreased appetite, malaise/fatigue, weight gain and weight loss.  Eyes: Negative for visual disturbance.  Cardiovascular: Positive for chest pain and dyspnea on exertion. Negative for claudication, leg swelling, orthopnea, palpitations and syncope.  Respiratory: Negative for hemoptysis and wheezing.   Endocrine: Negative for cold intolerance and heat intolerance.  Hematologic/Lymphatic: Does not bruise/bleed easily.  Skin: Negative for nail changes.  Musculoskeletal: Negative for muscle weakness and myalgias.  Gastrointestinal: Positive for abdominal pain (left upper abd pain-follows Dr. Benson Norway). Negative for change in bowel habit, nausea and vomiting.  Neurological: Negative for difficulty with concentration, dizziness, focal weakness and headaches.  Psychiatric/Behavioral: Negative for altered mental status and suicidal ideas.  All other systems reviewed and are negative.     Objective:  Blood pressure (!) 194/86, pulse 80, height 5\' 6"  (1.676 m), weight 258 lb (117 kg), SpO2 97 %. Body mass index is 41.64 kg/m.    Physical Exam  Constitutional: He is oriented to person, place, and time. Vital signs are normal. He appears well-developed and well-nourished.  Morbidly obese  HENT:  Head: Normocephalic and atraumatic.  Neck: Normal  range of motion.  Cardiovascular: Normal rate, regular rhythm, normal heart sounds and intact distal pulses.  Pulmonary/Chest: Effort normal and breath sounds normal. No accessory muscle usage. No respiratory distress.  Sternotomy scar from CABG  Abdominal: Soft. Bowel sounds are normal.  Musculoskeletal: Normal range of motion.  Neurological: He is alert and oriented to person, place, and time.  Skin: Skin is warm and dry.  Vitals reviewed.  Radiology: No results found.  Laboratory examination:    CMP Latest Ref Rng & Units 01/05/2019 12/21/2018 06/16/2018  Glucose 70 - 99 mg/dL 205(H) 214(H) 120(H)  BUN 8 - 23 mg/dL 29(H) 37(H) 21  Creatinine 0.61 - 1.24 mg/dL 1.58(H) 2.00(H) 1.65(H)  Sodium 135 - 145 mmol/L 140 140 142  Potassium 3.5 - 5.1 mmol/L 5.0 4.8 4.2  Chloride 98 - 111 mmol/L 106 102 113(H)  CO2 22 - 32 mmol/L 26 21 21(L)  Calcium 8.9 -  10.3 mg/dL 9.6 10.2 8.8(L)  Total Protein 6.5 - 8.1 g/dL - - 6.6  Total Bilirubin 0.3 - 1.2 mg/dL - - 0.7  Alkaline Phos 38 - 126 U/L - - 48  AST 15 - 41 U/L - - 23  ALT 0 - 44 U/L - - 23   CBC Latest Ref Rng & Units 01/05/2019 06/16/2018 02/28/2018  WBC 4.0 - 10.5 K/uL 6.9 12.2(H) 9.0  Hemoglobin 13.0 - 17.0 g/dL 13.2 15.3 15.5  Hematocrit 39.0 - 52.0 % 39.0 46.0 46.8  Platelets 150 - 400 K/uL 131(L) 125(L) 123(L)   Lipid Panel  No results found for: CHOL, TRIG, HDL, CHOLHDL, VLDL, LDLCALC, LDLDIRECT HEMOGLOBIN A1C No results found for: HGBA1C, MPG TSH No results for input(s): TSH in the last 8760 hours.  PRN Meds:. Medications Discontinued During This Encounter  Medication Reason  . famotidine (PEPCID) 10 MG tablet Error  . QUEtiapine (SEROQUEL) 25 MG tablet Error  . simvastatin (ZOCOR) 40 MG tablet Error  . hydrALAZINE (APRESOLINE) 25 MG tablet Dose change   Current Meds  Medication Sig  . aspirin EC 81 MG tablet Take 81 mg by mouth daily.  . clopidogrel (PLAVIX) 75 MG tablet Take 1 tablet (75 mg total) by mouth daily.  .  furosemide (LASIX) 20 MG tablet Take 20 mg by mouth 2 (two) times daily.   Marland Kitchen HYDROcodone-acetaminophen (NORCO) 10-325 MG tablet Take 1 tablet by mouth 3 (three) times daily.   Marland Kitchen ibuprofen (ADVIL,MOTRIN) 200 MG tablet Take 400 mg by mouth every 6 (six) hours as needed for headache or moderate pain.   Marland Kitchen insulin aspart (NOVOLOG) 100 UNIT/ML FlexPen Inject 10 Units into the skin daily.  . insulin detemir (LEVEMIR) 100 UNIT/ML injection Inject 20 Units into the skin at bedtime.   . isosorbide mononitrate (IMDUR) 60 MG 24 hr tablet TAKE 1 TABLET(60 MG) BY MOUTH DAILY  . LINZESS 72 MCG capsule daily.  . metFORMIN (GLUCOPHAGE) 500 MG tablet Take 1 tablet (500 mg total) by mouth 2 (two) times daily with a meal.  . metoprolol tartrate (LOPRESSOR) 50 MG tablet Take 50 mg by mouth daily.  . nitroGLYCERIN (NITROSTAT) 0.4 MG SL tablet Place 1 tablet (0.4 mg total) under the tongue every 5 (five) minutes as needed for chest pain.  Marland Kitchen omeprazole (PRILOSEC) 20 MG capsule Take 1 capsule (20 mg total) by mouth daily.  . [DISCONTINUED] hydrALAZINE (APRESOLINE) 25 MG tablet Take 1 tablet (25 mg total) by mouth 3 (three) times daily.    Cardiac Studies:   Coronary angiogram 01/05/2019: Normal LVEDP, severe native vessel disease.  High-grade proximal RCA 90 to 95% stenosis followed by 70 to 80% stenosis in the in-stent restenotic proximal segment.  Large RCA.  SVG to RCA could not be visualized in spite of multiple catheter attempts, and presumed occluded. Left main is large and mildly diseased but patent.  LAD is occluded in the midsegment after the origin of D1, which has ostial 90% stenosis.  SVG to D1 is patent.  LIMA to LAD is patent. Ramus intermediate: Large vessel, mild to moderate calcific 80 to 85% stenosis in the proximal segment.  SVG to ramus intermediate is occluded. Circumflex: High-grade 99%/subtotal occlusion in the ostium.  Moderate to large sized vessel.  Small marginals.   Lexiscan myoview stress  test 10/10/2017: 1. Lexiscan stress test was performed. Exercise capacity was not assessed. Stress symptoms included dizziness. Normal blood pressure. The resting electrocardiogram demonstrated normal sinus rhythm, normal resting conduction, no resting arrhythmias  and normal rest repolarization. Stress EKG is non diagnostic for ischemia as it is a pharmacologic stress. 2. The overall quality of the study is good. Left ventricular cavity is noted to be normal on the rest and stress studies. Gated SPECT images reveal normal myocardial thickening and wall motion, except mild inferior hypokinesis. The left ventricular ejection fraction was calculated or visually estimated to be 53%. SPECT stress images demonstrate medium size, moderate intensity perfusion defect in basal to apical inferior, inferolateral myocardium. SPECT rest images demonstrate medium size, mild intensity perfusion defect in basal to apical inferior myocardium. This suggests inferior infarct with periinfarct mild ischemia, as well as inferolateral ischemia. (LCx/PDA territory). 3. Intermediate risk study.  Echocardiogram  10/07/2017: Left ventricle cavity is normal in size. Concentric remodeling of the left ventricle. Mild decrease in global wall motion. Visual EF is 45-50%. Doppler evidence of grade I (impaired) diastolic dysfunction, normal LAP. Moderate aortic valve leaflet calcification. Trace aortic valve stenosis. Mild tricuspid regurgitation. No evidence of pulmonary hypertension.  Assessment:     ICD-10-CM   1. Hypertension with heart disease  I11.9 EKG 12-Lead  2. Coronary artery disease of native heart with stable angina pectoris, unspecified vessel or lesion type ([place])  O97.353 Basic metabolic panel    EKG 29/92/4268: Normal sinus rhythm at 68 bpm, left anterior hemi-block, IRBBB. T wave inversion in V2 is new compared to previous EKG, suspect related to hypertension, cannot exclude ischemia.  Recommendations:   I  discussed recent coronary angiogram findings with the patient, he will need planned intervention to his RCA.  We will arrange for this in the next few weeks.  Intervention was not performed during his most recent cath due to difficult procedure and renal insufficiency.  We will check his BMP prior to the procedure.  Risk with the procedure were again discussed. Schedule for cardiac catheterization, and possible angioplasty. We discussed regarding risks, benefits, alternatives to this including stress testing, CTA and continued medical therapy. Patient wants to proceed. Understands <1-2% risk of death, stroke, MI, urgent CABG, bleeding, infection, renal failure but not limited to these.  He has been feeling some better since being on hydralazine and isosorbide.  His blood pressure is markedly elevated with discontinuing his irbesartan in view of renal insufficiency.  I will increase his hydralazine to 50 mg 3 times daily.  We will also add amlodipine 5 mg daily.  I have encouraged him to continue to take it easy until he can have further work-up.  No complications noted to right radial access site.  We will see him back after the procedure for follow-up.   *I have discussed this case with Dr. Einar Gip and he participated in formulating the plan.*   Miquel Dunn, MSN, APRN, FNP-C [facility]. [place] Office: [phone] Fax: [phone]

## 2019-01-17 ENCOUNTER — Encounter: Payer: Self-pay | Admitting: Cardiology

## 2019-01-20 ENCOUNTER — Other Ambulatory Visit: Payer: Self-pay | Admitting: Cardiology

## 2019-01-21 LAB — BASIC METABOLIC PANEL
BUN/Creatinine Ratio: 16 (ref 10–24)
BUN: 31 mg/dL — ABNORMAL HIGH (ref 8–27)
CO2: 22 mmol/L (ref 20–29)
Calcium: 9.7 mg/dL (ref 8.6–10.2)
Chloride: 104 mmol/L (ref 96–106)
Creatinine, Ser: 1.89 mg/dL — ABNORMAL HIGH (ref 0.76–1.27)
GFR calc Af Amer: 40 mL/min/{1.73_m2} — ABNORMAL LOW (ref >59)
GFR calc non Af Amer: 35 mL/min/{1.73_m2} — ABNORMAL LOW (ref >59)
Glucose: 84 mg/dL (ref 65–99)
Potassium: 4.4 mmol/L (ref 3.5–5.2)
Sodium: 142 mmol/L (ref 134–144)

## 2019-01-22 ENCOUNTER — Other Ambulatory Visit (HOSPITAL_COMMUNITY)
Admission: RE | Admit: 2019-01-22 | Discharge: 2019-01-22 | Disposition: A | Discharge status: Home / Self Care | Payer: Medicare HMO | Source: Ambulatory Visit | Attending: Cardiology | Admitting: Cardiology

## 2019-01-22 DIAGNOSIS — Z01812 Encounter for preprocedural laboratory examination: Secondary | ICD-10-CM | POA: Diagnosis present

## 2019-01-22 DIAGNOSIS — Z20828 Contact with and (suspected) exposure to other viral communicable diseases: Secondary | ICD-10-CM | POA: Diagnosis not present

## 2019-01-22 LAB — SARS CORONAVIRUS 2 (TAT 6-24 HRS): SARS Coronavirus 2: NEGATIVE

## 2019-01-26 ENCOUNTER — Ambulatory Visit (HOSPITAL_COMMUNITY)
Admission: RE | Admit: 2019-01-26 | Discharge: 2019-01-26 | Disposition: A | Discharge status: Home / Self Care | Payer: Medicare HMO | Attending: Cardiology | Admitting: Cardiology

## 2019-01-26 ENCOUNTER — Ambulatory Visit (HOSPITAL_COMMUNITY)
Admission: RE | Disposition: A | Discharge status: Home / Self Care | Payer: Self-pay | Source: Home / Self Care | Attending: Cardiology

## 2019-01-26 ENCOUNTER — Other Ambulatory Visit: Payer: Self-pay

## 2019-01-26 DIAGNOSIS — E785 Hyperlipidemia, unspecified: Secondary | ICD-10-CM | POA: Diagnosis not present

## 2019-01-26 DIAGNOSIS — I129 Hypertensive chronic kidney disease with stage 1 through stage 4 chronic kidney disease, or unspecified chronic kidney disease: Secondary | ICD-10-CM | POA: Insufficient documentation

## 2019-01-26 DIAGNOSIS — I209 Angina pectoris, unspecified: Secondary | ICD-10-CM

## 2019-01-26 DIAGNOSIS — Z794 Long term (current) use of insulin: Secondary | ICD-10-CM | POA: Insufficient documentation

## 2019-01-26 DIAGNOSIS — I25119 Atherosclerotic heart disease of native coronary artery with unspecified angina pectoris: Secondary | ICD-10-CM | POA: Diagnosis present

## 2019-01-26 DIAGNOSIS — Z6841 Body Mass Index (BMI) 40.0 and over, adult: Secondary | ICD-10-CM | POA: Diagnosis not present

## 2019-01-26 DIAGNOSIS — Z8673 Personal history of transient ischemic attack (TIA), and cerebral infarction without residual deficits: Secondary | ICD-10-CM | POA: Insufficient documentation

## 2019-01-26 DIAGNOSIS — N184 Chronic kidney disease, stage 4 (severe): Secondary | ICD-10-CM | POA: Diagnosis not present

## 2019-01-26 DIAGNOSIS — I252 Old myocardial infarction: Secondary | ICD-10-CM | POA: Diagnosis not present

## 2019-01-26 DIAGNOSIS — R9439 Abnormal result of other cardiovascular function study: Secondary | ICD-10-CM

## 2019-01-26 DIAGNOSIS — Z7982 Long term (current) use of aspirin: Secondary | ICD-10-CM | POA: Insufficient documentation

## 2019-01-26 DIAGNOSIS — E1122 Type 2 diabetes mellitus with diabetic chronic kidney disease: Secondary | ICD-10-CM | POA: Insufficient documentation

## 2019-01-26 DIAGNOSIS — Z79899 Other long term (current) drug therapy: Secondary | ICD-10-CM | POA: Diagnosis not present

## 2019-01-26 DIAGNOSIS — Z955 Presence of coronary angioplasty implant and graft: Secondary | ICD-10-CM

## 2019-01-26 DIAGNOSIS — E7800 Pure hypercholesterolemia, unspecified: Secondary | ICD-10-CM | POA: Diagnosis not present

## 2019-01-26 DIAGNOSIS — K219 Gastro-esophageal reflux disease without esophagitis: Secondary | ICD-10-CM | POA: Diagnosis not present

## 2019-01-26 DIAGNOSIS — E1142 Type 2 diabetes mellitus with diabetic polyneuropathy: Secondary | ICD-10-CM | POA: Diagnosis not present

## 2019-01-26 DIAGNOSIS — I2510 Atherosclerotic heart disease of native coronary artery without angina pectoris: Secondary | ICD-10-CM | POA: Diagnosis present

## 2019-01-26 HISTORY — PX: CORONARY STENT INTERVENTION: CATH118234

## 2019-01-26 LAB — BASIC METABOLIC PANEL
Anion gap: 11 (ref 5–15)
BUN: 39 mg/dL — ABNORMAL HIGH (ref 8–23)
CO2: 22 mmol/L (ref 22–32)
Calcium: 8.9 mg/dL (ref 8.9–10.3)
Chloride: 105 mmol/L (ref 98–111)
Creatinine, Ser: 2.03 mg/dL — ABNORMAL HIGH (ref 0.61–1.24)
GFR calc Af Amer: 37 mL/min — ABNORMAL LOW (ref >60)
GFR calc non Af Amer: 32 mL/min — ABNORMAL LOW (ref >60)
Glucose, Bld: 174 mg/dL — ABNORMAL HIGH (ref 70–99)
Potassium: 4.2 mmol/L (ref 3.5–5.1)
Sodium: 138 mmol/L (ref 135–145)

## 2019-01-26 LAB — GLUCOSE, CAPILLARY
Glucose-Capillary: 143 mg/dL — ABNORMAL HIGH (ref 70–99)
Glucose-Capillary: 147 mg/dL — ABNORMAL HIGH (ref 70–99)
Glucose-Capillary: 119 mg/dL — ABNORMAL HIGH (ref 70–99)

## 2019-01-26 LAB — CBC
HCT: 41.9 % (ref 39.0–52.0)
Hemoglobin: 13.5 g/dL (ref 13.0–17.0)
MCH: 30.8 pg (ref 26.0–34.0)
MCHC: 32.2 g/dL (ref 30.0–36.0)
MCV: 95.4 fL (ref 80.0–100.0)
Platelets: 128 10*3/uL — ABNORMAL LOW (ref 150–400)
RBC: 4.39 MIL/uL (ref 4.22–5.81)
RDW: 12.9 % (ref 11.5–15.5)
WBC: 6.7 10*3/uL (ref 4.0–10.5)
nRBC: 0 % (ref 0.0–0.2)

## 2019-01-26 LAB — POCT ACTIVATED CLOTTING TIME
Activated Clotting Time: 274 seconds
Activated Clotting Time: 285 seconds
Activated Clotting Time: 235 seconds
Activated Clotting Time: 180 seconds

## 2019-01-26 SURGERY — CORONARY STENT INTERVENTION
Anesthesia: LOCAL

## 2019-01-26 MED ORDER — SODIUM CHLORIDE 0.9% FLUSH: 3.0000 mL | INTRAVENOUS | Status: DC | PRN

## 2019-01-26 MED ORDER — SODIUM CHLORIDE 0.9 % IV SOLN
INTRAVENOUS | Status: AC | PRN
  Administered 2019-01-26: 10 mL/h via INTRAVENOUS

## 2019-01-26 MED ORDER — LIDOCAINE HCL (PF) 1 % IJ SOLN
INTRAMUSCULAR | Status: DC | PRN
  Administered 2019-01-26: 2 mL
  Administered 2019-01-26: 15 mL

## 2019-01-26 MED ORDER — SODIUM CHLORIDE 0.9% FLUSH: 3.0000 mL | Freq: Two times a day (BID) | INTRAVENOUS | Status: DC

## 2019-01-26 MED ORDER — SODIUM CHLORIDE 0.9 % WEIGHT BASED INFUSION
3.0000 mL/kg/h | INTRAVENOUS | Status: AC
  Administered 2019-01-26: 3 mL/kg/h via INTRAVENOUS

## 2019-01-26 MED ORDER — ASPIRIN 81 MG PO CHEW
81.0000 mg | CHEWABLE_TABLET | ORAL | Status: AC
  Administered 2019-01-26: 81 mg via ORAL

## 2019-01-26 MED ORDER — HEPARIN SODIUM (PORCINE) 1000 UNIT/ML IJ SOLN
INTRAMUSCULAR | Status: AC
  Filled 2019-01-26: qty 1

## 2019-01-26 MED ORDER — HEPARIN SODIUM (PORCINE) 1000 UNIT/ML IJ SOLN
INTRAMUSCULAR | Status: DC | PRN
  Administered 2019-01-26: 2000 [IU] via INTRAVENOUS
  Administered 2019-01-26: 9000 [IU] via INTRAVENOUS

## 2019-01-26 MED ORDER — ONDANSETRON HCL 4 MG/2ML IJ SOLN: 4.0000 mg | Freq: Four times a day (QID) | INTRAMUSCULAR | Status: DC | PRN

## 2019-01-26 MED ORDER — MIDAZOLAM HCL 2 MG/2ML IJ SOLN
INTRAMUSCULAR | Status: DC | PRN
  Administered 2019-01-26: 1 mg via INTRAVENOUS

## 2019-01-26 MED ORDER — IOHEXOL 350 MG/ML SOLN
INTRAVENOUS | Status: DC | PRN
  Administered 2019-01-26: 75 mL via INTRACARDIAC

## 2019-01-26 MED ORDER — ACETAMINOPHEN 325 MG PO TABS: 650.0000 mg | ORAL_TABLET | ORAL | Status: DC | PRN

## 2019-01-26 MED ORDER — SODIUM CHLORIDE 0.9 % IV SOLN: 250.0000 mL | INTRAVENOUS | Status: DC | PRN

## 2019-01-26 MED ORDER — ASPIRIN 81 MG PO CHEW
CHEWABLE_TABLET | ORAL | Status: AC
  Filled 2019-01-26: qty 1

## 2019-01-26 MED ORDER — HEPARIN (PORCINE) IN NACL 1000-0.9 UT/500ML-% IV SOLN
INTRAVENOUS | Status: DC | PRN
  Administered 2019-01-26 (×2): 500 mL

## 2019-01-26 MED ORDER — LIDOCAINE HCL (PF) 1 % IJ SOLN
INTRAMUSCULAR | Status: AC
  Filled 2019-01-26: qty 30

## 2019-01-26 MED ORDER — FENTANYL CITRATE (PF) 100 MCG/2ML IJ SOLN
INTRAMUSCULAR | Status: DC | PRN
  Administered 2019-01-26: 25 ug via INTRAVENOUS

## 2019-01-26 MED ORDER — VERAPAMIL HCL 2.5 MG/ML IV SOLN
INTRAVENOUS | Status: AC
  Filled 2019-01-26: qty 2

## 2019-01-26 MED ORDER — SODIUM CHLORIDE 0.9 % WEIGHT BASED INFUSION: 1.0000 mL/kg/h | INTRAVENOUS | Status: DC

## 2019-01-26 MED ORDER — LABETALOL HCL 5 MG/ML IV SOLN: 10.0000 mg | INTRAVENOUS | Status: DC | PRN

## 2019-01-26 MED ORDER — HEPARIN (PORCINE) IN NACL 1000-0.9 UT/500ML-% IV SOLN
INTRAVENOUS | Status: AC
  Filled 2019-01-26: qty 1000

## 2019-01-26 MED ORDER — NITROGLYCERIN 1 MG/10 ML FOR IR/CATH LAB
INTRA_ARTERIAL | Status: AC
  Filled 2019-01-26: qty 10

## 2019-01-26 MED ORDER — METFORMIN HCL 500 MG PO TABS: 500.0000 mg | ORAL_TABLET | Freq: Two times a day (BID) | ORAL | Status: DC

## 2019-01-26 MED ORDER — FENTANYL CITRATE (PF) 100 MCG/2ML IJ SOLN
INTRAMUSCULAR | Status: AC
  Filled 2019-01-26: qty 2

## 2019-01-26 MED ORDER — CLOPIDOGREL BISULFATE 75 MG PO TABS
600.0000 mg | ORAL_TABLET | ORAL | Status: AC
  Administered 2019-01-26: 600 mg via ORAL
  Filled 2019-01-26: qty 8

## 2019-01-26 MED ORDER — MIDAZOLAM HCL 2 MG/2ML IJ SOLN
INTRAMUSCULAR | Status: AC
  Filled 2019-01-26: qty 2

## 2019-01-26 SURGICAL SUPPLY — 20 items
BALLN WOLVERINE 2.50X15 (BALLOONS) ×2
BALLOON WOLVERINE 2.50X15 (BALLOONS) ×1 IMPLANT
CATH VISTA GUIDE 6FR JR4 (CATHETERS) ×2 IMPLANT
CLOSURE MYNX CONTROL 6F/7F (Vascular Products) ×2 IMPLANT
ELECT DEFIB PAD ADLT CADENCE (PAD) ×2 IMPLANT
GLIDESHEATH SLEND A-KIT 6F 22G (SHEATH) ×2 IMPLANT
GUIDEWIRE INQWIRE 1.5J.035X260 (WIRE) ×1 IMPLANT
INQWIRE 1.5J .035X260CM (WIRE) ×2
KIT ENCORE 26 ADVANTAGE (KITS) ×2 IMPLANT
KIT HEART LEFT (KITS) ×2 IMPLANT
KIT MICROPUNCTURE NIT STIFF (SHEATH) ×2 IMPLANT
PACK CARDIAC CATHETERIZATION (CUSTOM PROCEDURE TRAY) ×2 IMPLANT
SHEATH PINNACLE 5F 10CM (SHEATH) ×2 IMPLANT
SHEATH PINNACLE 6F 10CM (SHEATH) ×2 IMPLANT
SHEATH PROBE COVER 6X72 (BAG) ×2 IMPLANT
STENT SYNERGY DES 3X38 (Permanent Stent) ×4 IMPLANT
TRANSDUCER W/STOPCOCK (MISCELLANEOUS) ×2 IMPLANT
TUBING CIL FLEX 10 FLL-RA (TUBING) ×2 IMPLANT
WIRE COUGAR XT STRL 190CM (WIRE) ×2 IMPLANT
WIRE EMERALD 3MM-J .035X150CM (WIRE) ×2 IMPLANT

## 2019-01-26 NOTE — Progress Notes (Signed)
Discussed stents, Plavix, diet, exercise, NTG, and CRPII. Will refer to [place].  Pt is interested in participating in Virtual Cardiac Rehab. Pt advised that Virtual Cardiac Rehab is provided at no cost to the patient.  Checklist:  1. Pt has smart device  ie smartphone and/or ipad for downloading an app  Yes 2. Reliable internet/wifi service    Yes 3. Understands how to use their smartphone and navigate within an app.  Yes  Reviewed with pt the scheduling process for virtual cardiac rehab.  Pt verbalized understanding. [employer] 2:06 PM 01/26/2019

## 2019-01-26 NOTE — Interval H&P Note (Signed)
History and Physical Interval Note:  01/26/2019 7:49 AM  David Burton  has presented today for surgery, with the diagnosis of CAD.  The various methods of treatment have been discussed with the patient and family. After consideration of risks, benefits and other options for treatment, the patient has consented to  Procedure(s): CORONARY STENT INTERVENTION (N/A) as a surgical intervention.  The patient's history has been reviewed, patient examined, no change in status, stable for surgery.  I have reviewed the patient's chart and labs.  Questions were answered to the patient's satisfaction.   Cath Lab Visit (complete for each Cath Lab visit)  Clinical Evaluation Leading to the Procedure:   ACS: No.  Non-ACS:    Anginal Classification: CCS III  Anti-ischemic medical therapy: Maximal Therapy (2 or more classes of medications)  Non-Invasive Test Results: High-risk stress test findings: cardiac mortality >3%/year  Prior CABG: Previous CABG   Planned PCI        Adrian Prows

## 2019-01-26 NOTE — Discharge Instructions (Signed)
Femoral Site Care °This sheet gives you information about how to care for yourself after your procedure. Your health care provider may also give you more specific instructions. If you have problems or questions, contact your health care provider. °What can I expect after the procedure? °After the procedure, it is common to have: °· Bruising that usually fades within 1-2 weeks. °· Tenderness at the site. °Follow these instructions at home: °Wound care °· Follow instructions from your health care provider about how to take care of your insertion site. Make sure you: °? Wash your hands with soap and water before you change your bandage (dressing). If soap and water are not available, use hand sanitizer. °? Change your dressing as told by your health care provider. °? Leave stitches (sutures), skin glue, or adhesive strips in place. These skin closures may need to stay in place for 2 weeks or longer. If adhesive strip edges start to loosen and curl up, you may trim the loose edges. Do not remove adhesive strips completely unless your health care provider tells you to do that. °· Do not take baths, swim, or use a hot tub until your health care provider approves. °· You may shower 24-48 hours after the procedure or as told by your health care provider. °? Gently wash the site with plain soap and water. °? Pat the area dry with a clean towel. °? Do not rub the site. This may cause bleeding. °· Do not apply powder or lotion to the site. Keep the site clean and dry. °· Check your femoral site every day for signs of infection. Check for: °? Redness, swelling, or pain. °? Fluid or blood. °? Warmth. °? Pus or a bad smell. °Activity °· For the first 2-3 days after your procedure, or as long as directed: °? Avoid climbing stairs as much as possible. °? Do not squat. °· Do not lift anything that is heavier than 10 lb (4.5 kg), or the limit that you are told, until your health care provider says that it is safe. °· Rest as  directed. °? Avoid sitting for a long time without moving. Get up to take short walks every 1-2 hours. °· Do not drive for 24 hours if you were given a medicine to help you relax (sedative). °General instructions °· Take over-the-counter and prescription medicines only as told by your health care provider. °· Keep all follow-up visits as told by your health care provider. This is important. °Contact a health care provider if you have: °· A fever or chills. °· You have redness, swelling, or pain around your insertion site. °Get help right away if: °· The catheter insertion area swells very fast. °· You pass out. °· You suddenly start to sweat or your skin gets clammy. °· The catheter insertion area is bleeding, and the bleeding does not stop when you hold steady pressure on the area. °· The area near or just beyond the catheter insertion site becomes pale, cool, tingly, or numb. °These symptoms may represent a serious problem that is an emergency. Do not wait to see if the symptoms will go away. Get medical help right away. Call your local emergency services (911 in the [place] Do not drive yourself to the hospital. °Summary °· After the procedure, it is common to have bruising that usually fades within 1-2 weeks. °· Check your femoral site every day for signs of infection. °· Do not lift anything that is heavier than 10 lb (4.5 kg), or the   limit that you are told, until your health care provider says that it is safe. °This information is not intended to replace advice given to you by your health care provider. Make sure you discuss any questions you have with your health care provider. °Document Released: 01/21/2014 Document Revised: 06/02/2017 Document Reviewed: 06/02/2017 °Elsevier Patient Education © 2020 [employer] ° °

## 2019-01-26 NOTE — Progress Notes (Signed)
Site area: rt groin fv sheath Site Prior to Removal:  Level 0 Pressure Applied For: 15 minutes Manual:   yes Patient Status During Pull:  stable Post Pull Site:  Level 0 Post Pull Instructions Given:  yes Post Pull Pulses Present: rt dp palpable Dressing Applied:  Gauze and tegaderm Bedrest begins @ 1130 Comments:

## 2019-01-26 NOTE — Progress Notes (Signed)
Pt states he lives alone and was not planning on anyone staying with him. He has been instructed that someone needs to stay with him overnight if he is going home.  He has called his daughter now and informed her that he will need to stay with her.

## 2019-01-26 NOTE — Progress Notes (Signed)
No bleeding or hematoma noted after ambulation 

## 2019-01-27 ENCOUNTER — Telehealth (HOSPITAL_COMMUNITY): Payer: Self-pay

## 2019-01-27 ENCOUNTER — Encounter (HOSPITAL_COMMUNITY): Payer: Self-pay | Admitting: Cardiology

## 2019-01-27 MED FILL — Verapamil HCl IV Soln 2.5 MG/ML: INTRAVENOUS | Qty: 2 | Status: AC

## 2019-01-27 MED FILL — Nitroglycerin IV Soln 100 MCG/ML in D5W: INTRA_ARTERIAL | Qty: 10 | Status: AC

## 2019-01-27 NOTE — Telephone Encounter (Signed)
Pt insurance is active and benefits verified through [employer] $10.00, DED 0/0 met, out of pocket $3,400/$595 met, co-insurance 0. no pre-authorization required. Passport, 01/27/2019@10 :48am, REF# [id number]  Will contact patient to see if he is interested in the Cardiac Rehab Program. If interested, patient will need to complete follow up appt. Once completed, patient will be contacted for scheduling upon review by the RN Navigator.

## 2019-02-10 ENCOUNTER — Other Ambulatory Visit: Payer: Self-pay

## 2019-02-10 ENCOUNTER — Encounter: Payer: Self-pay | Admitting: Cardiology

## 2019-02-10 ENCOUNTER — Ambulatory Visit (INDEPENDENT_AMBULATORY_CARE_PROVIDER_SITE_OTHER): Payer: Medicare HMO | Admitting: Cardiology

## 2019-02-10 VITALS — BP 128/63 | HR 63 | Ht 67.0 in | Wt 253.5 lb

## 2019-02-10 DIAGNOSIS — N183 Chronic kidney disease, stage 3 unspecified: Secondary | ICD-10-CM

## 2019-02-10 DIAGNOSIS — I2510 Atherosclerotic heart disease of native coronary artery without angina pectoris: Secondary | ICD-10-CM

## 2019-02-10 DIAGNOSIS — Z955 Presence of coronary angioplasty implant and graft: Secondary | ICD-10-CM | POA: Diagnosis not present

## 2019-02-10 DIAGNOSIS — I119 Hypertensive heart disease without heart failure: Secondary | ICD-10-CM | POA: Diagnosis not present

## 2019-02-10 DIAGNOSIS — E7800 Pure hypercholesterolemia, unspecified: Secondary | ICD-10-CM

## 2019-02-10 NOTE — Progress Notes (Signed)
Primary Physician:  Harvie Junior, MD   Patient ID: David Burton, male    DOB: 01-27-1946, 73 y.o.   MRN: [id number]  Subjective:    Chief Complaint  Patient presents with  . Coronary Artery Disease  . Hypertension  . Follow-up    HPI: David Burton  is a 73 y.o. male  with history of NSTEMI in Dec 2018, s/p CABG x 4 on 05/08/2017 (LIMA to LAD, SVG to PDA, SVG to Diagonal, SVG to Ramus. Surgery done at [facility], [place]), diabetes mellitus type 2 with peripheral neuropathy, hypertension and hypercholesterolemia.   Due to symptoms of angina and previously abnormal nuclear stress, he underwent coronary angiogram on 01/05/19 revealing high grade proximal RCA 90-95% stenosis followed by 70-80% stenosis in the in-stent proximal segment, Ramus intermediate with 80-85% stenosis in proximal segment and circumflex had high-grade 99% subtotal occlusion in the ostium.  Due to complexity of the procedure and renal insufficiency, he underwent staged intervention on 8/25 with placement of 2 overlapping stents to proximal mid and mid to distal RCA.  He does have high-grade ramus intermediate and circumflex stenosis, no intervention was performed at this time again due to the complexity of intervention.  Medical management was recommended unless he has recurrence of angina.  He now presents for follow-up.  He was started on amlodipine at his last office visit and isosorbide was increased.  Blood pressure has significantly improved.  He denies any chest pain since his procedure.  Has minimal dyspnea on exertion with walking uphill, but states he feels much better.  Does get tired with some activities, but also states again that he feels better than before.  Tolerating medications well.   He states that diabetes is now much better controlled.  He was previously on irbesartan for hypertension, but this was discontinued in view of renal insufficiency.  Past Medical History:  Diagnosis Date  . Bell  palsy   . CKD (chronic kidney disease), stage III ([place]) 12/16/2018  . Coronary artery disease involving native coronary artery of native heart with unstable angina pectoris ([place]) 12/16/2018  . Diabetes mellitus   . Diabetes mellitus without complication ([place])   . GERD (gastroesophageal reflux disease)   . Hypertension   . Hypertension with heart disease 12/16/2018  . Myocardial infarction ([place])   . Pure hypercholesterolemia 12/16/2018  . Stroke ([place])   . Type 2 diabetes, controlled, with peripheral neuropathy ([place]) 12/16/2018    Past Surgical History:  Procedure Laterality Date  . CARDIAC SURGERY    . COLONOSCOPY WITH PROPOFOL N/A 01/02/2018   Procedure: COLONOSCOPY WITH PROPOFOL;  Surgeon: Carol Ada, MD;  Location: [facility];  Service: Endoscopy;  Laterality: N/A;  . CORONARY STENT INTERVENTION N/A 01/26/2019   Procedure: CORONARY STENT INTERVENTION;  Surgeon: Adrian Prows, MD;  Location: [facility];  Service: Cardiovascular;  Laterality: N/A;  . LEFT HEART CATH AND CORS/GRAFTS ANGIOGRAPHY N/A 01/05/2019   Procedure: LEFT HEART CATH AND CORS/GRAFTS ANGIOGRAPHY;  Surgeon: Adrian Prows, MD;  Location: [facility];  Service: Cardiovascular;  Laterality: N/A;  . POLYPECTOMY  01/02/2018   Procedure: POLYPECTOMY;  Surgeon: Carol Ada, MD;  Location: [facility];  Service: Endoscopy;;  . stents      Social History   Socioeconomic History  . Marital status: Single    Spouse name: Not on file  . Number of children: 3  . Years of education: Not on file  . Highest education level: Not on file  Occupational History  . Not on file  Social Needs  . Financial resource strain: Not on file  . Food insecurity    Worry: Not on file    Inability: Not on file  . Transportation needs    Medical: Not on file    Non-medical: Not on file  Tobacco Use  . Smoking status: Former Smoker    Packs/day: 1.00    Years: 5.00    Pack years: 5.00    Types: Cigarettes    Quit date: 2005     Years since quitting: 15.6  . Smokeless tobacco: Never Used  Substance and Sexual Activity  . Alcohol use: Yes    Comment: 2/week, whiskey with [place]  . Drug use: No  . Sexual activity: Never  Lifestyle  . Physical activity    Days per week: Not on file    Minutes per session: Not on file  . Stress: Not on file  Relationships  . Social Herbalist on phone: Not on file    Gets together: Not on file    Attends religious service: Not on file    Active member of club or organization: Not on file    Attends meetings of clubs or organizations: Not on file    Relationship status: Not on file  . Intimate partner violence    Fear of current or ex partner: Not on file    Emotionally abused: Not on file    Physically abused: Not on file    Forced sexual activity: Not on file  Other Topics Concern  . Not on file  Social History Narrative   ** Merged History Encounter **        Review of Systems  Constitution: Negative for decreased appetite, malaise/fatigue, weight gain and weight loss.  Eyes: Negative for visual disturbance.  Cardiovascular: Positive for dyspnea on exertion (with walking uphill, but improved). Negative for chest pain, claudication, leg swelling, orthopnea, palpitations and syncope.  Respiratory: Negative for hemoptysis and wheezing.   Endocrine: Negative for cold intolerance and heat intolerance.  Hematologic/Lymphatic: Does not bruise/bleed easily.  Skin: Negative for nail changes.  Musculoskeletal: Negative for muscle weakness and myalgias.  Gastrointestinal: Positive for abdominal pain (left upper abd pain-follows Dr. Benson Norway). Negative for change in bowel habit, nausea and vomiting.  Neurological: Negative for difficulty with concentration, dizziness, focal weakness and headaches.  Psychiatric/Behavioral: Negative for altered mental status and suicidal ideas.  All other systems reviewed and are negative.     Objective:  Blood pressure 128/63,  pulse 63, height 5\' 7"  (1.702 m), weight 253 lb 8 oz (115 kg), SpO2 96 %. Body mass index is 39.7 kg/m.    Physical Exam  Constitutional: He is oriented to person, place, and time. Vital signs are normal. He appears well-developed and well-nourished.  Morbidly obese  HENT:  Head: Normocephalic and atraumatic.  Neck: Normal range of motion.  Cardiovascular: Normal rate, regular rhythm, normal heart sounds and intact distal pulses.  Pulses:      Femoral pulses are 2+ on the right side. No hematoma to right groin   Pulmonary/Chest: Effort normal and breath sounds normal. No accessory muscle usage. No respiratory distress.  Sternotomy scar from CABG  Abdominal: Soft. Bowel sounds are normal.  Musculoskeletal: Normal range of motion.  Neurological: He is alert and oriented to person, place, and time.  Skin: Skin is warm and dry.  Vitals reviewed.  Radiology: No results found.  Laboratory examination:  CMP Latest Ref Rng & Units 01-27-19 01/20/2019 01/05/2019  Glucose 70 - 99 mg/dL 174(H) 84 205(H)  BUN 8 - 23 mg/dL 39(H) 31(H) 29(H)  Creatinine 0.61 - 1.24 mg/dL 2.03(H) 1.89(H) 1.58(H)  Sodium 135 - 145 mmol/L 138 142 140  Potassium 3.5 - 5.1 mmol/L 4.2 4.4 5.0  Chloride 98 - 111 mmol/L 105 104 106  CO2 22 - 32 mmol/L 22 22 26   Calcium 8.9 - 10.3 mg/dL 8.9 9.7 9.6  Total Protein 6.5 - 8.1 g/dL - - -  Total Bilirubin 0.3 - 1.2 mg/dL - - -  Alkaline Phos 38 - 126 U/L - - -  AST 15 - 41 U/L - - -  ALT 0 - 44 U/L - - -   CBC Latest Ref Rng & Units 01-27-2019 01/05/2019 06/16/2018  WBC 4.0 - 10.5 K/uL 6.7 6.9 12.2(H)  Hemoglobin 13.0 - 17.0 g/dL 13.5 13.2 15.3  Hematocrit 39.0 - 52.0 % 41.9 39.0 46.0  Platelets 150 - 400 K/uL 128(L) 131(L) 125(L)   Lipid Panel  No results found for: CHOL, TRIG, HDL, CHOLHDL, VLDL, LDLCALC, LDLDIRECT HEMOGLOBIN A1C No results found for: HGBA1C, MPG TSH No results for input(s): TSH in the last 8760 hours.  PRN Meds:. There are no discontinued  medications. Current Meds  Medication Sig  . amLODipine (NORVASC) 5 MG tablet Take 1 tablet (5 mg total) by mouth daily.  Marland Kitchen aspirin EC 81 MG tablet Take 81 mg by mouth daily.  . clopidogrel (PLAVIX) 75 MG tablet Take 1 tablet (75 mg total) by mouth daily.  . furosemide (LASIX) 20 MG tablet Take 20 mg by mouth 2 (two) times daily.   . hydrALAZINE (APRESOLINE) 50 MG tablet Take 1 tablet (50 mg total) by mouth 3 (three) times daily.  Marland Kitchen HYDROcodone-acetaminophen (NORCO) 10-325 MG tablet Take 1 tablet by mouth 3 (three) times daily as needed for moderate pain.   Marland Kitchen ibuprofen (ADVIL,MOTRIN) 200 MG tablet Take 400 mg by mouth every 6 (six) hours as needed for headache or moderate pain.   Marland Kitchen insulin aspart (NOVOLOG) 100 UNIT/ML FlexPen Inject 8-10 Units into the skin 3 (three) times daily with meals.   . insulin detemir (LEVEMIR) 100 UNIT/ML injection Inject 20 Units into the skin at bedtime.   . isosorbide mononitrate (IMDUR) 60 MG 24 hr tablet TAKE 1 TABLET(60 MG) BY MOUTH DAILY (Patient taking differently: Take 60 mg by mouth daily. )  . LINZESS 72 MCG capsule Take 72 mcg by mouth daily before breakfast.   . metFORMIN (GLUCOPHAGE) 500 MG tablet Take 1 tablet (500 mg total) by mouth 2 (two) times daily with a meal.  . metoprolol tartrate (LOPRESSOR) 50 MG tablet Take 50 mg by mouth daily.  . nitroGLYCERIN (NITROSTAT) 0.4 MG SL tablet Place 1 tablet (0.4 mg total) under the tongue every 5 (five) minutes as needed for chest pain.  Marland Kitchen omeprazole (PRILOSEC) 20 MG capsule Take 1 capsule (20 mg total) by mouth daily.    Cardiac Studies:   Coronary intervention 27-Jan-2019: Successful PTCA and stenting of the proximal mid and mid to distal RCA with implantation of 2 overlapping 3.0 x 38 mm Promus Synergy DES, stenosis reduced from 90% to 0% with TIMI-3 to TIMI-3 flow.  75 mill contrast utilized.  Coronary angiogram 01/05/2019: Normal LVEDP, severe native vessel disease.  High-grade proximal RCA 90 to 95%  stenosis followed by 70 to 80% stenosis in the in-stent restenotic proximal segment.  Large RCA.  SVG to RCA could  not be visualized in spite of multiple catheter attempts, and presumed occluded. Left main is large and mildly diseased but patent.  LAD is occluded in the midsegment after the origin of D1, which has ostial 90% stenosis.  SVG to D1 is patent.  LIMA to LAD is patent. Ramus intermediate: Large vessel, mild to moderate calcific 80 to 85% stenosis in the proximal segment.  SVG to ramus intermediate is occluded. Circumflex: High-grade 99%/subtotal occlusion in the ostium.  Moderate to large sized vessel.  Small marginals.   Lexiscan myoview stress test 10/10/2017: 1. Lexiscan stress test was performed. Exercise capacity was not assessed. Stress symptoms included dizziness. Normal blood pressure. The resting electrocardiogram demonstrated normal sinus rhythm, normal resting conduction, no resting arrhythmias and normal rest repolarization. Stress EKG is non diagnostic for ischemia as it is a pharmacologic stress. 2. The overall quality of the study is good. Left ventricular cavity is noted to be normal on the rest and stress studies. Gated SPECT images reveal normal myocardial thickening and wall motion, except mild inferior hypokinesis. The left ventricular ejection fraction was calculated or visually estimated to be 53%. SPECT stress images demonstrate medium size, moderate intensity perfusion defect in basal to apical inferior, inferolateral myocardium. SPECT rest images demonstrate medium size, mild intensity perfusion defect in basal to apical inferior myocardium. This suggests inferior infarct with periinfarct mild ischemia, as well as inferolateral ischemia. (LCx/PDA territory). 3. Intermediate risk study.  Echocardiogram  10/07/2017: Left ventricle cavity is normal in size. Concentric remodeling of the left ventricle. Mild decrease in global wall motion. Visual EF is 45-50%. Doppler  evidence of grade I (impaired) diastolic dysfunction, normal LAP. Moderate aortic valve leaflet calcification. Trace aortic valve stenosis. Mild tricuspid regurgitation. No evidence of pulmonary hypertension.  Assessment:     ICD-10-CM   1. Coronary artery disease involving native coronary artery of native heart without angina pectoris  I25.10 EKG 12-Lead  2. S/P primary angioplasty with coronary stent  Z95.5   3. Hypertension with heart disease  I11.9   4. CKD (chronic kidney disease), stage III (HCC)  Q68.3 Basic metabolic panel  5. Pure hypercholesterolemia  E78.00     EKG 02/10/2019: Normal sinus rhythm at 71 bpm, left axis deviation, PRWP, cannot exclude anterior infarct old. IRBBB. No evidence of ischemia.   Recommendations:   Patient presents for post-cath follow-up.  He is presently doing well, has had significant improvement in his symptoms since his procedure.  He denies any chest pain, only has shortness of breath with walking uphill.  Does get tired with some activities, but states that this is significantly improved.  He does have disease on the left, if he has recurrent symptoms could consider intervention for this, but for now would recommend medical management.  I recommended that he start cardiac rehab.  He is tolerating medications well.  Blood pressure has significantly improved, will continue the same.  Continue with DAPT for at least 1 year.  Right groin access site is healing well, no hematoma.  Denies any complications.  I have encouraged him to continue to work on his modifiable risk factors with weight loss.  Kidney function has recently worsened due to coronary angiogram, will repeat BMP in 3 to 4 weeks to follow-up on this to ensure that this is improving.  Lipids are well controlled, continue with current medical therapy.  I will see him back in 3 months for follow-up.   Miquel Dunn, MSN, APRN, FNP-C [facility]. [place] Office: [phone] Fax:  [phone] 

## 2019-02-11 ENCOUNTER — Telehealth (HOSPITAL_COMMUNITY): Payer: Self-pay | Admitting: *Deleted

## 2019-02-11 NOTE — Telephone Encounter (Signed)
-----   Message from Adrian Prows, MD sent at 02/10/2019  4:36 PM EDT ----- Regarding: RE: Ok to participate in group exercise with high Covid Risk Score He will benefit from exercise ----- Message ----- From: Rowe Pavy, RN Sent: 02/10/2019   4:31 PM EDT To: Adrian Prows, MD Subject: Ok to participate in group exercise with hig#  Greetings Dr. Einar Gip,  We are  seeing patients on-site for cardiac rehab . A great deal of planning with advisement from our Medical Director - Dr. Radford Pax, CV Service line leadership, Infection Disease Control, Facilities, security, recommendations from American Association of Cardiac and Pulmonary Rehab (AACVPR) with the goal for optimal patient safety. Patients will have strict guidelines and criteria they must adhere to and follow. Patients will wear a mask during exercise and practice social distancing. Patients will have to complete screening prior to entry into gym area.   Your patient expressed great interest in participating in facility cardiac rehab. Patient has a COVID-19 risk score of 7. Do you feel this patient is appropriate to resume exercise in facility cardiac rehab? Any additional restrictions you feel are appropriate for this patient?   Completed his follow up today with Jeri Lager  Thank you and we appreciate your input  Maurice Small RN, BSN Cardiac and Pulmonary Rehab Nurse Navigator   Cardiac Rehab Staff

## 2019-02-12 ENCOUNTER — Encounter (HOSPITAL_COMMUNITY): Payer: Self-pay

## 2019-04-09 ENCOUNTER — Other Ambulatory Visit: Payer: Self-pay

## 2019-04-13 ENCOUNTER — Other Ambulatory Visit: Payer: Self-pay

## 2019-04-13 MED ORDER — AMLODIPINE BESYLATE 5 MG PO TABS: 5.0000 mg | ORAL_TABLET | Freq: Every day | ORAL | 1 refills | Status: DC

## 2019-04-13 MED ORDER — HYDRALAZINE HCL 50 MG PO TABS: 50.0000 mg | ORAL_TABLET | Freq: Three times a day (TID) | ORAL | 2 refills | Status: DC

## 2019-05-10 ENCOUNTER — Ambulatory Visit (INDEPENDENT_AMBULATORY_CARE_PROVIDER_SITE_OTHER): Payer: Medicare HMO | Admitting: Cardiology

## 2019-05-10 ENCOUNTER — Other Ambulatory Visit: Payer: Self-pay

## 2019-05-10 ENCOUNTER — Encounter: Payer: Self-pay | Admitting: Cardiology

## 2019-05-10 VITALS — BP 144/72 | HR 76 | Temp 96.3°F | Ht 66.0 in | Wt 255.6 lb

## 2019-05-10 DIAGNOSIS — I119 Hypertensive heart disease without heart failure: Secondary | ICD-10-CM

## 2019-05-10 DIAGNOSIS — I130 Hypertensive heart and chronic kidney disease with heart failure and stage 1 through stage 4 chronic kidney disease, or unspecified chronic kidney disease: Secondary | ICD-10-CM | POA: Diagnosis not present

## 2019-05-10 DIAGNOSIS — E7800 Pure hypercholesterolemia, unspecified: Secondary | ICD-10-CM

## 2019-05-10 DIAGNOSIS — I2510 Atherosclerotic heart disease of native coronary artery without angina pectoris: Secondary | ICD-10-CM | POA: Diagnosis not present

## 2019-05-10 DIAGNOSIS — N1832 Chronic kidney disease, stage 3b: Secondary | ICD-10-CM | POA: Diagnosis not present

## 2019-05-10 MED ORDER — AMLODIPINE BESYLATE 5 MG PO TABS: 5.0000 mg | ORAL_TABLET | Freq: Every day | ORAL | 2 refills | Status: DC

## 2019-05-10 NOTE — Progress Notes (Signed)
Primary Physician:  Harvie Junior, MD   Patient ID: [name], male    DOB: 12/13/1945, 73 y.o.   MRN: [id number]  Subjective:    Chief Complaint  Patient presents with  . Coronary Artery Disease    HPI: Dam Ashraf  is a 73 y.o. male  with history of NSTEMI in Dec 2018, s/p CABG x 4 on 05/08/2017 (LIMA to LAD, SVG to PDA, SVG to Diagonal, SVG to Ramus. Surgery done at [facility], [place]), diabetes mellitus type 2 with peripheral neuropathy, hypertension and hypercholesterolemia.   Due to symptoms of angina and previously abnormal nuclear stress, he underwent coronary angiogram on 01/05/19 revealing high grade proximal RCA 90-95% stenosis followed by 70-80% stenosis in the in-stent proximal segment, Ramus intermediate with 80-85% stenosis in proximal segment and circumflex had high-grade 99% subtotal occlusion in the ostium.  Due to complexity of the procedure and renal insufficiency, he underwent staged intervention on 8/25 with placement of 2 overlapping stents to proximal mid and mid to distal RCA.  He does have high-grade ramus intermediate and circumflex stenosis, no intervention was performed at this time again due to the complexity of intervention.  Medical management was recommended unless he has recurrence of angina.    Last seen 3 months ago, he reports that he is doing very well.  He reports 1 episode of chest discomfort after eating that was relieved with nitroglycerin.  He has not had any significant symptoms of angina like before his cath.  Has minimal dyspnea on exertion with walking uphill but still improved.  He was unable to participate in cardiac rehab after his procedure due to his sister being ill, but is now willing to participate.  States that he has been out of his amlodipine for the last few days.  Also mentions that he has stopped his cholesterol medicine a few days ago.   He states that diabetes is now much better controlled.  He was previously on  irbesartan for hypertension, but this was discontinued in view of renal insufficiency.  Past Medical History:  Diagnosis Date  . Bell palsy   . CKD (chronic kidney disease), stage III 12/16/2018  . Coronary artery disease involving native coronary artery of native heart with unstable angina pectoris ([place]) 12/16/2018  . Diabetes mellitus   . Diabetes mellitus without complication ([place])   . GERD (gastroesophageal reflux disease)   . Hypertension   . Hypertension with heart disease 12/16/2018  . Myocardial infarction ([place])   . Pure hypercholesterolemia 12/16/2018  . Stroke ([place])   . Type 2 diabetes, controlled, with peripheral neuropathy ([place]) 12/16/2018    Past Surgical History:  Procedure Laterality Date  . CARDIAC SURGERY    . COLONOSCOPY WITH PROPOFOL N/A 01/02/2018   Procedure: COLONOSCOPY WITH PROPOFOL;  Surgeon: Carol Ada, MD;  Location: [facility];  Service: Endoscopy;  Laterality: N/A;  . CORONARY STENT INTERVENTION N/A 01/26/2019   Procedure: CORONARY STENT INTERVENTION;  Surgeon: Adrian Prows, MD;  Location: [facility];  Service: Cardiovascular;  Laterality: N/A;  . LEFT HEART CATH AND CORS/GRAFTS ANGIOGRAPHY N/A 01/05/2019   Procedure: LEFT HEART CATH AND CORS/GRAFTS ANGIOGRAPHY;  Surgeon: Adrian Prows, MD;  Location: [facility];  Service: Cardiovascular;  Laterality: N/A;  . POLYPECTOMY  01/02/2018   Procedure: POLYPECTOMY;  Surgeon: Carol Ada, MD;  Location: [facility];  Service: Endoscopy;;  . stents      Social History   Socioeconomic History  . Marital status: Single  Spouse name: Not on file  . Number of children: 3  . Years of education: Not on file  . Highest education level: Not on file  Occupational History  . Not on file  Social Needs  . Financial resource strain: Not on file  . Food insecurity    Worry: Not on file    Inability: Not on file  . Transportation needs    Medical: Not on file    Non-medical: Not on file  Tobacco Use  .  Smoking status: Former Smoker    Packs/day: 1.00    Years: 5.00    Pack years: 5.00    Types: Cigarettes    Quit date: 2005    Years since quitting: 15.9  . Smokeless tobacco: Never Used  Substance and Sexual Activity  . Alcohol use: Yes    Comment: 2/week, whiskey with [place]  . Drug use: No  . Sexual activity: Never  Lifestyle  . Physical activity    Days per week: Not on file    Minutes per session: Not on file  . Stress: Not on file  Relationships  . Social Herbalist on phone: Not on file    Gets together: Not on file    Attends religious service: Not on file    Active member of club or organization: Not on file    Attends meetings of clubs or organizations: Not on file    Relationship status: Not on file  . Intimate partner violence    Fear of current or ex partner: Not on file    Emotionally abused: Not on file    Physically abused: Not on file    Forced sexual activity: Not on file  Other Topics Concern  . Not on file  Social History Narrative   ** Merged History Encounter **        Review of Systems  Constitution: Negative for decreased appetite, malaise/fatigue, weight gain and weight loss.  Eyes: Negative for visual disturbance.  Cardiovascular: Positive for dyspnea on exertion (with walking uphill, but improved). Negative for chest pain, claudication, leg swelling, orthopnea, palpitations and syncope.  Respiratory: Negative for hemoptysis and wheezing.   Endocrine: Negative for cold intolerance and heat intolerance.  Hematologic/Lymphatic: Does not bruise/bleed easily.  Skin: Negative for nail changes.  Musculoskeletal: Negative for muscle weakness and myalgias.  Gastrointestinal: Positive for abdominal pain (left upper abd pain-follows Dr. Benson Norway). Negative for change in bowel habit, nausea and vomiting.  Neurological: Negative for difficulty with concentration, dizziness, focal weakness and headaches.  Psychiatric/Behavioral: Negative for  altered mental status and suicidal ideas.  All other systems reviewed and are negative.     Objective:  Blood pressure (!) 144/72, pulse 76, temperature (!) 96.3 F (35.7 C), height 5\' 6"  (1.676 m), weight 255 lb 9.6 oz (115.9 kg), SpO2 97 %. Body mass index is 41.25 kg/m.    Physical Exam  Constitutional: He is oriented to person, place, and time. Vital signs are normal. He appears well-developed and well-nourished.  Morbidly obese  HENT:  Head: Normocephalic and atraumatic.  Neck: Normal range of motion.  Cardiovascular: Normal rate, regular rhythm, normal heart sounds and intact distal pulses.  Pulses:      Femoral pulses are 2+ on the right side. No hematoma to right groin   Pulmonary/Chest: Effort normal and breath sounds normal. No accessory muscle usage. No respiratory distress.  Sternotomy scar from CABG  Abdominal: Soft. Bowel sounds are normal.  Musculoskeletal: Normal range  of motion.  Neurological: He is alert and oriented to person, place, and time.  Skin: Skin is warm and dry.  Vitals reviewed.  Radiology: No results found.  Laboratory examination:    CMP Latest Ref Rng & Units 02-08-2019 01/20/2019 01/05/2019  Glucose 70 - 99 mg/dL 174(H) 84 205(H)  BUN 8 - 23 mg/dL 39(H) 31(H) 29(H)  Creatinine 0.61 - 1.24 mg/dL 2.03(H) 1.89(H) 1.58(H)  Sodium 135 - 145 mmol/L 138 142 140  Potassium 3.5 - 5.1 mmol/L 4.2 4.4 5.0  Chloride 98 - 111 mmol/L 105 104 106  CO2 22 - 32 mmol/L 22 22 26   Calcium 8.9 - 10.3 mg/dL 8.9 9.7 9.6  Total Protein 6.5 - 8.1 g/dL - - -  Total Bilirubin 0.3 - 1.2 mg/dL - - -  Alkaline Phos 38 - 126 U/L - - -  AST 15 - 41 U/L - - -  ALT 0 - 44 U/L - - -   CBC Latest Ref Rng & Units 02/08/19 01/05/2019 06/16/2018  WBC 4.0 - 10.5 K/uL 6.7 6.9 12.2(H)  Hemoglobin 13.0 - 17.0 g/dL 13.5 13.2 15.3  Hematocrit 39.0 - 52.0 % 41.9 39.0 46.0  Platelets 150 - 400 K/uL 128(L) 131(L) 125(L)   Lipid Panel  No results found for: CHOL, TRIG, HDL, CHOLHDL,  VLDL, LDLCALC, LDLDIRECT HEMOGLOBIN A1C No results found for: HGBA1C, MPG TSH No results for input(s): TSH in the last 8760 hours.  PRN Meds:. There are no discontinued medications. Current Meds  Medication Sig  . amLODipine (NORVASC) 5 MG tablet Take 1 tablet (5 mg total) by mouth daily.  Marland Kitchen aspirin EC 81 MG tablet Take 81 mg by mouth daily.  . clopidogrel (PLAVIX) 75 MG tablet Take 1 tablet (75 mg total) by mouth daily.  . furosemide (LASIX) 20 MG tablet Take 20 mg by mouth 2 (two) times daily.   . hydrALAZINE (APRESOLINE) 50 MG tablet Take 1 tablet (50 mg total) by mouth 3 (three) times daily.  Marland Kitchen HYDROcodone-acetaminophen (NORCO) 10-325 MG tablet Take 1 tablet by mouth 3 (three) times daily as needed for moderate pain.   Marland Kitchen ibuprofen (ADVIL,MOTRIN) 200 MG tablet Take 400 mg by mouth every 6 (six) hours as needed for headache or moderate pain.   Marland Kitchen insulin aspart (NOVOLOG) 100 UNIT/ML FlexPen Inject 8-10 Units into the skin 3 (three) times daily with meals.   . insulin detemir (LEVEMIR) 100 UNIT/ML injection Inject 20 Units into the skin at bedtime.   . isosorbide mononitrate (IMDUR) 60 MG 24 hr tablet TAKE 1 TABLET(60 MG) BY MOUTH DAILY (Patient taking differently: Take 60 mg by mouth daily. )  . LINZESS 72 MCG capsule Take 72 mcg by mouth daily before breakfast.   . metFORMIN (GLUCOPHAGE) 500 MG tablet Take 1 tablet (500 mg total) by mouth 2 (two) times daily with a meal.  . metoprolol tartrate (LOPRESSOR) 50 MG tablet Take 50 mg by mouth daily.  . nitroGLYCERIN (NITROSTAT) 0.4 MG SL tablet Place 1 tablet (0.4 mg total) under the tongue every 5 (five) minutes as needed for chest pain.  Marland Kitchen omeprazole (PRILOSEC) 20 MG capsule Take 1 capsule (20 mg total) by mouth daily.    Cardiac Studies:   Coronary intervention February 08, 2019: Successful PTCA and stenting of the proximal mid and mid to distal RCA with implantation of 2 overlapping 3.0 x 38 mm Promus Synergy DES, stenosis reduced from 90% to  0% with TIMI-3 to TIMI-3 flow.  75 mill contrast utilized.  Coronary angiogram  01/05/2019: Normal LVEDP, severe native vessel disease.  High-grade proximal RCA 90 to 95% stenosis followed by 70 to 80% stenosis in the in-stent restenotic proximal segment.  Large RCA.  SVG to RCA could not be visualized in spite of multiple catheter attempts, and presumed occluded. Left main is large and mildly diseased but patent.  LAD is occluded in the midsegment after the origin of D1, which has ostial 90% stenosis.  SVG to D1 is patent.  LIMA to LAD is patent. Ramus intermediate: Large vessel, mild to moderate calcific 80 to 85% stenosis in the proximal segment.  SVG to ramus intermediate is occluded. Circumflex: High-grade 99%/subtotal occlusion in the ostium.  Moderate to large sized vessel.  Small marginals.   Lexiscan myoview stress test 10/10/2017: 1. Lexiscan stress test was performed. Exercise capacity was not assessed. Stress symptoms included dizziness. Normal blood pressure. The resting electrocardiogram demonstrated normal sinus rhythm, normal resting conduction, no resting arrhythmias and normal rest repolarization. Stress EKG is non diagnostic for ischemia as it is a pharmacologic stress. 2. The overall quality of the study is good. Left ventricular cavity is noted to be normal on the rest and stress studies. Gated SPECT images reveal normal myocardial thickening and wall motion, except mild inferior hypokinesis. The left ventricular ejection fraction was calculated or visually estimated to be 53%. SPECT stress images demonstrate medium size, moderate intensity perfusion defect in basal to apical inferior, inferolateral myocardium. SPECT rest images demonstrate medium size, mild intensity perfusion defect in basal to apical inferior myocardium. This suggests inferior infarct with periinfarct mild ischemia, as well as inferolateral ischemia. (LCx/PDA territory). 3. Intermediate risk study.   Echocardiogram  10/07/2017: Left ventricle cavity is normal in size. Concentric remodeling of the left ventricle. Mild decrease in global wall motion. Visual EF is 45-50%. Doppler evidence of grade I (impaired) diastolic dysfunction, normal LAP. Moderate aortic valve leaflet calcification. Trace aortic valve stenosis. Mild tricuspid regurgitation. No evidence of pulmonary hypertension.  Assessment:     ICD-10-CM   1. Coronary artery disease involving native coronary artery of native heart without angina pectoris  I25.10   2. Hypertension with heart disease  I11.9   3. Pure hypercholesterolemia  E78.00     EKG 02/10/2019: Normal sinus rhythm at 71 bpm, left axis deviation, PRWP, cannot exclude anterior infarct old. IRBBB. No evidence of ischemia.   Recommendations:   Patient is here for 106-month office visit and follow-up for CAD.  He is doing well, has had 1 episode of angina that was easily relieved by nitroglycerin.  His blood pressure has been well controlled at home.  He has been out of amlodipine for the last few days, will refill this.  He does admit to stopping his cholesterol medicine a few days ago also.  I have discussed the importance of statin therapy, he is willing to restart.  He does need labs performed to follow-up on chronic kidney disease as well as his lipids and CBC.  Will obtain lipid, CBC, and BMP for surveillance.  He will continue to need to work on modifiable risk factors particularly with controlling his weight.  We discussed measures to help with weight loss.  I do continue to feel that he will benefit from cardiac rehab.  He was initially unable to participate due to his sister being ill, but is now willing to participate.  We will send a message to them advising to contact the patient.  Overall he is stable from cardiac standpoint.  I will see  him back in 6 months for follow-up or sooner if needed.   Miquel Dunn, MSN, APRN, FNP-C [facility].  [place] Office: [phone] Fax: [phone]

## 2019-08-13 ENCOUNTER — Ambulatory Visit: Payer: Medicare HMO | Attending: Internal Medicine

## 2019-08-13 DIAGNOSIS — Z23 Encounter for immunization: Secondary | ICD-10-CM

## 2019-08-13 NOTE — Progress Notes (Signed)
   Covid-19 Vaccination Clinic  Name:  David Burton    MRN: [id number] DOB: 1946/01/28  08/13/2019  [name] was observed post Covid-19 immunization for 15 minutes without incident. He was provided with Vaccine Information Sheet and instruction to access the V-Safe system.   [name] was instructed to call 911 with any severe reactions post vaccine: Marland Kitchen Difficulty breathing  . Swelling of face and throat  . A fast heartbeat  . A bad rash all over body  . Dizziness and weakness   Immunizations Administered    Name Date Dose VIS Date Route   Pfizer COVID-19 Vaccine 08/13/2019 10:29 AM 0.3 mL 05/14/2019 Intramuscular   Manufacturer: [place]   Lot: [id number]   [place]: [id number]

## 2019-09-06 ENCOUNTER — Ambulatory Visit: Payer: Medicare HMO | Attending: Internal Medicine

## 2019-09-06 DIAGNOSIS — Z23 Encounter for immunization: Secondary | ICD-10-CM

## 2019-09-06 NOTE — Progress Notes (Signed)
   Covid-19 Vaccination Clinic  Name:  David Burton    MRN: [id number] DOB: January 10, 1946  09/06/2019  David Burton was observed post Covid-19 immunization for 15 minutes without incident. He was provided with Vaccine Information Sheet and instruction to access the V-Safe system.   David Burton was instructed to call 911 with any severe reactions post vaccine: Marland Kitchen Difficulty breathing  . Swelling of face and throat  . A fast heartbeat  . A bad rash all over body  . Dizziness and weakness   Immunizations Administered    Name Date Dose VIS Date Route   Pfizer COVID-19 Vaccine 09/06/2019 12:58 PM 0.3 mL 05/14/2019 Intramuscular   Manufacturer: [place]   Lot: [id number]   [place]: [id number]

## 2019-09-16 ENCOUNTER — Encounter (HOSPITAL_COMMUNITY): Payer: Self-pay

## 2019-09-16 ENCOUNTER — Observation Stay (HOSPITAL_COMMUNITY): Payer: Medicare HMO

## 2019-09-16 ENCOUNTER — Encounter (HOSPITAL_COMMUNITY): Payer: Self-pay | Admitting: Nurse Practitioner

## 2019-09-16 ENCOUNTER — Emergency Department (HOSPITAL_COMMUNITY): Payer: Medicare HMO

## 2019-09-16 ENCOUNTER — Other Ambulatory Visit: Payer: Self-pay

## 2019-09-16 ENCOUNTER — Inpatient Hospital Stay (HOSPITAL_COMMUNITY)
Admission: EM | Admit: 2019-09-16 | Discharge: 2019-09-21 | DRG: 065 | Disposition: A | Discharge status: Skilled Nursing Facility | Payer: Medicare HMO | Attending: Family Medicine | Admitting: Family Medicine

## 2019-09-16 DIAGNOSIS — E1122 Type 2 diabetes mellitus with diabetic chronic kidney disease: Secondary | ICD-10-CM | POA: Diagnosis present

## 2019-09-16 DIAGNOSIS — R531 Weakness: Secondary | ICD-10-CM

## 2019-09-16 DIAGNOSIS — I6350 Cerebral infarction due to unspecified occlusion or stenosis of unspecified cerebral artery: Secondary | ICD-10-CM | POA: Diagnosis present

## 2019-09-16 DIAGNOSIS — R519 Headache, unspecified: Secondary | ICD-10-CM

## 2019-09-16 DIAGNOSIS — I252 Old myocardial infarction: Secondary | ICD-10-CM

## 2019-09-16 DIAGNOSIS — Z20822 Contact with and (suspected) exposure to covid-19: Secondary | ICD-10-CM | POA: Diagnosis present

## 2019-09-16 DIAGNOSIS — R42 Dizziness and giddiness: Secondary | ICD-10-CM | POA: Diagnosis not present

## 2019-09-16 DIAGNOSIS — M25422 Effusion, left elbow: Secondary | ICD-10-CM

## 2019-09-16 DIAGNOSIS — E781 Pure hyperglyceridemia: Secondary | ICD-10-CM | POA: Diagnosis present

## 2019-09-16 DIAGNOSIS — I6381 Other cerebral infarction due to occlusion or stenosis of small artery: Principal | ICD-10-CM | POA: Diagnosis present

## 2019-09-16 DIAGNOSIS — E785 Hyperlipidemia, unspecified: Secondary | ICD-10-CM | POA: Diagnosis present

## 2019-09-16 DIAGNOSIS — E1165 Type 2 diabetes mellitus with hyperglycemia: Secondary | ICD-10-CM | POA: Diagnosis present

## 2019-09-16 DIAGNOSIS — I2510 Atherosclerotic heart disease of native coronary artery without angina pectoris: Secondary | ICD-10-CM | POA: Diagnosis present

## 2019-09-16 DIAGNOSIS — R260 Ataxic gait: Secondary | ICD-10-CM | POA: Diagnosis present

## 2019-09-16 DIAGNOSIS — G510 Bell's palsy: Secondary | ICD-10-CM | POA: Diagnosis present

## 2019-09-16 DIAGNOSIS — R29702 NIHSS score 2: Secondary | ICD-10-CM | POA: Diagnosis present

## 2019-09-16 DIAGNOSIS — N179 Acute kidney failure, unspecified: Secondary | ICD-10-CM | POA: Diagnosis present

## 2019-09-16 DIAGNOSIS — Z955 Presence of coronary angioplasty implant and graft: Secondary | ICD-10-CM

## 2019-09-16 DIAGNOSIS — Z7982 Long term (current) use of aspirin: Secondary | ICD-10-CM

## 2019-09-16 DIAGNOSIS — N1832 Chronic kidney disease, stage 3b: Secondary | ICD-10-CM | POA: Diagnosis present

## 2019-09-16 DIAGNOSIS — Z79899 Other long term (current) drug therapy: Secondary | ICD-10-CM

## 2019-09-16 DIAGNOSIS — Z66 Do not resuscitate: Secondary | ICD-10-CM | POA: Diagnosis present

## 2019-09-16 DIAGNOSIS — Z6841 Body Mass Index (BMI) 40.0 and over, adult: Secondary | ICD-10-CM

## 2019-09-16 DIAGNOSIS — G8194 Hemiplegia, unspecified affecting left nondominant side: Secondary | ICD-10-CM | POA: Diagnosis present

## 2019-09-16 DIAGNOSIS — Z8249 Family history of ischemic heart disease and other diseases of the circulatory system: Secondary | ICD-10-CM

## 2019-09-16 DIAGNOSIS — E669 Obesity, unspecified: Secondary | ICD-10-CM | POA: Diagnosis present

## 2019-09-16 DIAGNOSIS — Z794 Long term (current) use of insulin: Secondary | ICD-10-CM

## 2019-09-16 DIAGNOSIS — I5022 Chronic systolic (congestive) heart failure: Secondary | ICD-10-CM | POA: Diagnosis present

## 2019-09-16 DIAGNOSIS — Z87891 Personal history of nicotine dependence: Secondary | ICD-10-CM

## 2019-09-16 DIAGNOSIS — E1142 Type 2 diabetes mellitus with diabetic polyneuropathy: Secondary | ICD-10-CM | POA: Diagnosis present

## 2019-09-16 DIAGNOSIS — M7022 Olecranon bursitis, left elbow: Secondary | ICD-10-CM | POA: Diagnosis present

## 2019-09-16 DIAGNOSIS — Z7902 Long term (current) use of antithrombotics/antiplatelets: Secondary | ICD-10-CM

## 2019-09-16 DIAGNOSIS — K219 Gastro-esophageal reflux disease without esophagitis: Secondary | ICD-10-CM | POA: Diagnosis present

## 2019-09-16 DIAGNOSIS — I130 Hypertensive heart and chronic kidney disease with heart failure and stage 1 through stage 4 chronic kidney disease, or unspecified chronic kidney disease: Secondary | ICD-10-CM | POA: Diagnosis present

## 2019-09-16 DIAGNOSIS — F1290 Cannabis use, unspecified, uncomplicated: Secondary | ICD-10-CM | POA: Diagnosis present

## 2019-09-16 LAB — BASIC METABOLIC PANEL
Anion gap: 11 (ref 5–15)
BUN: 51 mg/dL — ABNORMAL HIGH (ref 8–23)
CO2: 26 mmol/L (ref 22–32)
Calcium: 9.6 mg/dL (ref 8.9–10.3)
Chloride: 99 mmol/L (ref 98–111)
Creatinine, Ser: 2.63 mg/dL — ABNORMAL HIGH (ref 0.61–1.24)
GFR calc Af Amer: 27 mL/min — ABNORMAL LOW (ref >60)
GFR calc non Af Amer: 23 mL/min — ABNORMAL LOW (ref >60)
Glucose, Bld: 390 mg/dL — ABNORMAL HIGH (ref 70–99)
Potassium: 4.8 mmol/L (ref 3.5–5.1)
Sodium: 136 mmol/L (ref 135–145)

## 2019-09-16 LAB — CBC
HCT: 45.2 % (ref 39.0–52.0)
Hemoglobin: 15.2 g/dL (ref 13.0–17.0)
MCH: 29.9 pg (ref 26.0–34.0)
MCHC: 33.6 g/dL (ref 30.0–36.0)
MCV: 89 fL (ref 80.0–100.0)
Platelets: 165 10*3/uL (ref 150–400)
RBC: 5.08 MIL/uL (ref 4.22–5.81)
RDW: 13.3 % (ref 11.5–15.5)
WBC: 9.6 10*3/uL (ref 4.0–10.5)
nRBC: 0 % (ref 0.0–0.2)

## 2019-09-16 LAB — HEPATIC FUNCTION PANEL
ALT: 22 U/L (ref 0–44)
AST: 17 U/L (ref 15–41)
Albumin: 3.8 g/dL (ref 3.5–5.0)
Alkaline Phosphatase: 87 U/L (ref 38–126)
Bilirubin, Direct: 0.2 mg/dL (ref 0.0–0.2)
Indirect Bilirubin: 0.8 mg/dL (ref 0.3–0.9)
Total Bilirubin: 1 mg/dL (ref 0.3–1.2)
Total Protein: 7.6 g/dL (ref 6.5–8.1)

## 2019-09-16 LAB — CBG MONITORING, ED
Glucose-Capillary: 364 mg/dL — ABNORMAL HIGH (ref 70–99)
Glucose-Capillary: 354 mg/dL — ABNORMAL HIGH (ref 70–99)
Glucose-Capillary: 270 mg/dL — ABNORMAL HIGH (ref 70–99)
Glucose-Capillary: 226 mg/dL — ABNORMAL HIGH (ref 70–99)

## 2019-09-16 LAB — TROPONIN I (HIGH SENSITIVITY)
Troponin I (High Sensitivity): 13 ng/L (ref <18)
Troponin I (High Sensitivity): 14 ng/L (ref <18)

## 2019-09-16 MED ORDER — CLOPIDOGREL BISULFATE 75 MG PO TABS
75.0000 mg | ORAL_TABLET | Freq: Every day | ORAL | Status: DC
  Administered 2019-09-16 – 2019-09-21 (×6): 75 mg via ORAL
  Filled 2019-09-16 (×6): qty 1

## 2019-09-16 MED ORDER — INSULIN ASPART 100 UNIT/ML ~~LOC~~ SOLN
0.0000 [IU] | Freq: Every day | SUBCUTANEOUS | Status: DC
  Administered 2019-09-16 – 2019-09-17 (×2): 2 [IU] via SUBCUTANEOUS
  Administered 2019-09-18: 3 [IU] via SUBCUTANEOUS
  Administered 2019-09-19 – 2019-09-20 (×2): 2 [IU] via SUBCUTANEOUS

## 2019-09-16 MED ORDER — ENOXAPARIN SODIUM 40 MG/0.4ML ~~LOC~~ SOLN
40.0000 mg | SUBCUTANEOUS | Status: DC
  Administered 2019-09-16 – 2019-09-20 (×5): 40 mg via SUBCUTANEOUS
  Filled 2019-09-16 (×5): qty 0.4

## 2019-09-16 MED ORDER — ACETAMINOPHEN 160 MG/5ML PO SOLN: 650.0000 mg | ORAL | Status: DC | PRN

## 2019-09-16 MED ORDER — SODIUM CHLORIDE 0.9 % IV BOLUS
1000.0000 mL | Freq: Once | INTRAVENOUS | Status: AC
  Administered 2019-09-16: 1000 mL via INTRAVENOUS

## 2019-09-16 MED ORDER — INSULIN ASPART 100 UNIT/ML ~~LOC~~ SOLN
0.0000 [IU] | Freq: Three times a day (TID) | SUBCUTANEOUS | Status: DC
  Administered 2019-09-17: 2 [IU] via SUBCUTANEOUS
  Administered 2019-09-17: 3 [IU] via SUBCUTANEOUS
  Administered 2019-09-17: 5 [IU] via SUBCUTANEOUS
  Administered 2019-09-18: 2 [IU] via SUBCUTANEOUS
  Administered 2019-09-18: 3 [IU] via SUBCUTANEOUS
  Administered 2019-09-18: 2 [IU] via SUBCUTANEOUS
  Administered 2019-09-19 (×3): 3 [IU] via SUBCUTANEOUS
  Administered 2019-09-20: 5 [IU] via SUBCUTANEOUS
  Administered 2019-09-20: 2 [IU] via SUBCUTANEOUS
  Administered 2019-09-20: 3 [IU] via SUBCUTANEOUS
  Administered 2019-09-21: 2 [IU] via SUBCUTANEOUS

## 2019-09-16 MED ORDER — LORAZEPAM 2 MG/ML IJ SOLN
0.5000 mg | Freq: Once | INTRAMUSCULAR | Status: AC
  Administered 2019-09-16: 0.5 mg via INTRAVENOUS
  Filled 2019-09-16: qty 1

## 2019-09-16 MED ORDER — ACETAMINOPHEN 325 MG PO TABS
650.0000 mg | ORAL_TABLET | ORAL | Status: DC | PRN
  Administered 2019-09-18 – 2019-09-21 (×7): 650 mg via ORAL
  Filled 2019-09-16 (×7): qty 2

## 2019-09-16 MED ORDER — ACETAMINOPHEN 650 MG RE SUPP
650.0000 mg | RECTAL | Status: DC | PRN
  Administered 2019-09-17: 650 mg via RECTAL
  Filled 2019-09-16: qty 1

## 2019-09-16 MED ORDER — SODIUM CHLORIDE 0.9 % IV SOLN: INTRAVENOUS | Status: AC

## 2019-09-16 MED ORDER — SODIUM CHLORIDE 0.9 % IV BOLUS: 500.0000 mL | Freq: Once | INTRAVENOUS | Status: DC

## 2019-09-16 MED ORDER — ROSUVASTATIN CALCIUM 5 MG PO TABS
20.0000 mg | ORAL_TABLET | Freq: Every day | ORAL | Status: DC
  Administered 2019-09-16: 20 mg via ORAL
  Filled 2019-09-16: qty 1

## 2019-09-16 MED ORDER — INSULIN ASPART 100 UNIT/ML ~~LOC~~ SOLN
10.0000 [IU] | Freq: Once | SUBCUTANEOUS | Status: AC
  Administered 2019-09-16: 10 [IU] via INTRAVENOUS

## 2019-09-16 MED ORDER — STROKE: EARLY STAGES OF RECOVERY BOOK
Freq: Once | Status: AC
  Administered 2019-09-16: 1
  Filled 2019-09-16: qty 1

## 2019-09-16 MED ORDER — INSULIN DETEMIR 100 UNIT/ML ~~LOC~~ SOLN
10.0000 [IU] | Freq: Every day | SUBCUTANEOUS | Status: DC
  Administered 2019-09-16: 10 [IU] via SUBCUTANEOUS
  Filled 2019-09-16 (×2): qty 0.1

## 2019-09-16 MED ORDER — ASPIRIN EC 81 MG PO TBEC
81.0000 mg | DELAYED_RELEASE_TABLET | Freq: Every day | ORAL | Status: DC
  Administered 2019-09-16 – 2019-09-21 (×6): 81 mg via ORAL
  Filled 2019-09-16 (×6): qty 1

## 2019-09-16 NOTE — ED Provider Notes (Signed)
[facility] EMERGENCY DEPARTMENT Provider Note   CSN: [id number] Arrival date & time: 09/16/19  0805     History Chief Complaint  Patient presents with  . Weakness  . Dizziness  . Headache    David Burton is a 74 y.o. male with a past medical history of diabetes, CKD stage III, CAD, hypertension, MI, stroke that presents to the emergency department for new onset weakness, dizziness, headache that began last week.  He states that he stopped taking his insulin injections because he felt weak on his extremities. He states that he is having to use assistance while walking for the past week.  He reports normally walking about 1 mile a day, but states he hasn't been able to do that for the past week.  He states that he has been taking his other medications, however he is a poor historian when speaking about his medications.  He states he has been taking about 5 aspirin a day and has also been taking his Plavix.  He states that he did take his Metformin this morning.  In regards to his dizziness, he states that the room does not feel like it spinning he just feels like he is "drunk."  He states that he has not been drinking any alcohol, the last time he drank was last week.  He states that his headache is frontal and constant and this is new for him.vHe denies any nausea vomiting diarrhea.  He states that his last bowel movement was last week.  He denies any falls or loss of consciousness.  He denies any back pain, fever, paresthesias, dysuria, hematuria, shortness of breath, chest pain, abdominal pain, acute vision changes. He states that his vision has been slightly blurry with flashing spots for the past year, which is unchanged.He denies any cough or URI symptoms and states that he has not been any around anyone with Covid.  He has been eating and drinking normally. He states that he was last normal about 1.5 weeks ago.   HPI     Past Medical History:  Diagnosis Date  . Bell  palsy   . CKD (chronic kidney disease), stage III 12/16/2018  . Coronary artery disease involving native coronary artery of native heart with unstable angina pectoris ([place]) 12/16/2018  . Diabetes mellitus   . Diabetes mellitus without complication ([place])   . GERD (gastroesophageal reflux disease)   . Hypertension   . Hypertension with heart disease 12/16/2018  . Myocardial infarction ([place])   . Pure hypercholesterolemia 12/16/2018  . Stroke ([place])   . Type 2 diabetes, controlled, with peripheral neuropathy ([place]) 12/16/2018    Patient Active Problem List   Diagnosis Date Noted  . Right pontine stroke ([place]) 09/16/2019  . Hypertension with heart disease 12/16/2018  . CAD (coronary artery disease), native coronary artery 12/16/2018  . CKD (chronic kidney disease), stage III 12/16/2018  . Pure hypercholesterolemia 12/16/2018  . Type 2 diabetes, controlled, with peripheral neuropathy ([place]) 12/16/2018    Past Surgical History:  Procedure Laterality Date  . CARDIAC SURGERY    . COLONOSCOPY WITH PROPOFOL N/A 01/02/2018   Procedure: COLONOSCOPY WITH PROPOFOL;  Surgeon: Carol Ada, MD;  Location: [facility];  Service: Endoscopy;  Laterality: N/A;  . CORONARY STENT INTERVENTION N/A 01/26/2019   Procedure: CORONARY STENT INTERVENTION;  Surgeon: Adrian Prows, MD;  Location: [facility];  Service: Cardiovascular;  Laterality: N/A;  . LEFT HEART CATH AND CORS/GRAFTS ANGIOGRAPHY N/A 01/05/2019   Procedure: LEFT HEART  CATH AND CORS/GRAFTS ANGIOGRAPHY;  Surgeon: Adrian Prows, MD;  Location: [facility];  Service: Cardiovascular;  Laterality: N/A;  . POLYPECTOMY  01/02/2018   Procedure: POLYPECTOMY;  Surgeon: Carol Ada, MD;  Location: [facility];  Service: Endoscopy;;  . stents         Family History  Problem Relation Age of Onset  . Heart disease Other   . Cancer Mother   . Heart attack Father     Social History   Tobacco Use  . Smoking status: Former Smoker    Packs/day: 1.00      Years: 5.00    Pack years: 5.00    Types: Cigarettes    Quit date: 2005    Years since quitting: 16.2  . Smokeless tobacco: Never Used  Substance Use Topics  . Alcohol use: Yes    Comment: 2/week, whiskey with Ginger[place]  . Drug use: No    Home Medications Prior to Admission medications   Medication Sig Start Date End Date Taking? Authorizing Provider  aspirin EC 81 MG tablet Take 243 mg by mouth daily.    Yes [provider]  HYDROcodone-acetaminophen (NORCO) 10-325 MG tablet Take 1 tablet by mouth 3 (three) times daily as needed for moderate pain.  02/04/15  Yes [provider]  ibuprofen (ADVIL,MOTRIN) 200 MG tablet Take 400 mg by mouth every 6 (six) hours as needed for headache or moderate pain.    Yes [provider]  insulin aspart (NOVOLOG) 100 UNIT/ML FlexPen Inject 8-10 Units into the skin 3 (three) times daily with meals.  06/10/17  Yes [provider]  insulin detemir (LEVEMIR) 100 UNIT/ML injection Inject 20 Units into the skin at bedtime.  06/10/17  Yes [provider]  metFORMIN (GLUCOPHAGE) 500 MG tablet Take 1 tablet (500 mg total) by mouth 2 (two) times daily with a meal. 01/28/19  Yes Adrian Prows, MD  nitroGLYCERIN (NITROSTAT) 0.4 MG SL tablet Place 1 tablet (0.4 mg total) under the tongue every 5 (five) minutes as needed for chest pain. 09/02/18  Yes Despina Hick, MD  simvastatin (ZOCOR) 40 MG tablet Take 40 mg by mouth at bedtime. 07/02/19  Yes [provider]  amLODipine (NORVASC) 5 MG tablet Take 1 tablet (5 mg total) by mouth daily. 05/10/19 08/08/19  Miquel Dunn, NP  clopidogrel (PLAVIX) 75 MG tablet Take 1 tablet (75 mg total) by mouth daily. Patient not taking: Reported on 09/16/2019 01/05/19 01/05/20  Adrian Prows, MD  hydrALAZINE (APRESOLINE) 50 MG tablet Take 1 tablet (50 mg total) by mouth 3 (three) times daily. 04/13/19 07/12/19  Miquel Dunn, NP  isosorbide mononitrate (IMDUR) 60 MG 24 hr tablet TAKE 1  TABLET(60 MG) BY MOUTH DAILY Patient not taking: No sig reported 12/18/18   Miquel Dunn, NP  omeprazole (PRILOSEC) 20 MG capsule Take 1 capsule (20 mg total) by mouth daily. Patient not taking: Reported on 09/16/2019 02/28/18   Charlesetta Shanks, MD    Allergies    Patient has no known allergies.  Review of Systems   Review of Systems  Neurological: Positive for dizziness, weakness and headaches.   10 systems are reviewed and are negative for acute changes, except as noted in HPI. Physical Exam Updated Vital Signs BP 124/78   Pulse 65   Temp 98.5 F (36.9 C) (Oral)   Resp 17   Ht 5\' 6"  (1.676 m)   Wt 113.4 kg   SpO2 96%   BMI 40.35 kg/m  Physical Exam .PE: Constitutional: well-developed, well-nourished, no apparent distress HENT: normocephalic, atraumatic Cardiovascular: normal rate and rhythm, distal pulses intact Pulmonary/Chest: effort normal; breath sounds clear and equal bilaterally; no wheezes or rales Abdominal: NBS, soft and nontender Musculoskeletal: full ROM, no edema, no calf tenderness, no back tenderness  Lymphadenopathy: no cervical adenopathy Neurological: .Alert. Clear speech. No facial droop. Ptosis is noted on right eye due to previous Bells palsy. Peripheral vision intact. CNIII-XII grossly intact. Bilateral upper and lower extremities' sensation grossly intact. 4/5 symmetric strength with grip strength and with plantar and dorsi flexion bilaterally.. Normal finger to nose on right side, pt is slightly shaky with coordination on left side. Negative pronator drift. Gait is steady and intact, however pt does feel dizzy when standing. Skin: warm and dry, no rash, no diaphoresis   No calf tendernes  ED Results / Procedures / Treatments   Labs (all labs ordered are listed, but only abnormal results are displayed) Labs Reviewed  BASIC METABOLIC PANEL - Abnormal; Notable for the following components:      Result Value   Glucose, Bld 390 (*)    BUN 51  (*)    Creatinine, Ser 2.63 (*)    GFR calc non Af Amer 23 (*)    GFR calc Af Amer 27 (*)    All other components within normal limits  CBG MONITORING, ED - Abnormal; Notable for the following components:   Glucose-Capillary 364 (*)    All other components within normal limits  CBG MONITORING, ED - Abnormal; Notable for the following components:   Glucose-Capillary 354 (*)    All other components within normal limits  CBG MONITORING, ED - Abnormal; Notable for the following components:   Glucose-Capillary 270 (*)    All other components within normal limits  CBC  HEPATIC FUNCTION PANEL  URINALYSIS, ROUTINE W REFLEX MICROSCOPIC  TROPONIN I (HIGH SENSITIVITY)  TROPONIN I (HIGH SENSITIVITY)    EKG EKG Interpretation  Date/Time:  Thursday September 16 2019 08:15:57 EDT Ventricular Rate:  68 PR Interval:  160 QRS Duration: 110 QT Interval:  412 QTC Calculation: 438 R Axis:   -45 Text Interpretation: Normal sinus rhythm Left axis deviation Incomplete right bundle branch block Minimal voltage criteria for LVH, may be normal variant ( R in aVL ) Nonspecific ST abnormality Abnormal ECG Confirmed by Pattricia Boss [phone]) on 09/16/2019 9:39:07 AM   Radiology CT Head Wo Contrast  Result Date: 09/16/2019 CLINICAL DATA:  Dizziness, nonspecific EXAM: CT HEAD WITHOUT CONTRAST TECHNIQUE: Contiguous axial images were obtained from the base of the skull through the vertex without intravenous contrast. COMPARISON:  MRI 07/10/2011 (images only) FINDINGS: Brain: There is a focal region of hypoattenuation in the right pons (2/10) given the patient's symptom onset timing, a subacute infarct is not excluded. Additional hypoattenuation centrally in the medulla has an appearance more compatible with streak artifact from the skull base. No other areas concerning for acute infarction. No hyperdense hemorrhage. No extra-axial collection, mass effect or midline shift. No frank hydrocephaly. Patchy areas of white  matter hypoattenuation are most compatible with chronic microvascular angiopathy. Symmetric prominence of the ventricles, cisterns and sulci compatible with parenchymal volume loss. Vascular: Atherosclerotic calcification of the carotid siphons and intradural vertebral arteries. No hyperdense vessel. Skull: No calvarial fracture or suspicious osseous lesion. No scalp swelling or hematoma. Scattered benign-appearing dermal calcifications are noted. Sinuses/Orbits: Mild mural thickening in the ethmoid air cells. Remaining paranasal sinuses are predominantly clear. Slightly hypo pneumatized appearance of the mastoid  air cells with a trace right mastoid effusion. Extensive debris noted in the bilateral external auditory canals including complete impaction of the right external auditory canal. Other: None IMPRESSION: 1. Focal region of hypoattenuation in the right pons (2/10) given the patient's symptom onset timing, finding raises suspicion for a subacute infarct additional hypoattenuation centrally in the medulla has an appearance more compatible with streak artifact from the skull base. Consider further evaluation with MRI. 2. Chronic microvascular angiopathy and parenchymal volume loss. 3. Extensive debris in the bilateral external auditory canals including complete impaction of the right external auditory canal. Correlate with visual inspection for cerumen impaction. 4. Trace right mastoid effusion. Electronically Signed   By: Lovena Le M.D.   On: 09/16/2019 15:25   MR BRAIN WO CONTRAST  Result Date: 09/16/2019 CLINICAL DATA:  Neuro deficit, acute, stroke suspected. Additional history provided: Difficulties with balance and walking for the past week EXAM: MRI HEAD WITHOUT CONTRAST TECHNIQUE: Multiplanar, multiecho pulse sequences of the brain and surrounding structures were obtained without intravenous contrast. COMPARISON:  Non-contrast head CT performed earlier the same day 09/16/2019 FINDINGS: Brain: Mild  intermittent motion degradation. There is a 17 x 17 mm focus of restricted diffusion within the right pons consistent with acute/early subacute infarct. Corresponding T2/FLAIR hyperintensity at this site. There is no evidence of intracranial mass. No midline shift or extra-axial fluid collection. No chronic intracranial blood products. Mild scattered T2/FLAIR hyperintensity within the cerebral white matter and pons is nonspecific, but consistent with chronic small vessel ischemic disease. Small chronic lacunar infarct within the left cerebellar hemisphere. Mild generalized parenchymal atrophy. Vascular: Flow voids maintained within the proximal large arterial vessels. Skull and upper cervical spine: No focal marrow lesion. Sinuses/Orbits: Visualized orbits demonstrate no acute abnormality. Mild paranasal sinus mucosal thickening. Trace fluid within left mastoid air cells. IMPRESSION: 1. Mildly motion degraded exam. 2. Confirmed 17 mm acute/early subacute infarct within the right pons. 3. Background mild chronic small vessel ischemic disease within the cerebral white matter and pons. Tiny chronic lacunar infarct within the left cerebellum. 4. Mild generalized parenchymal atrophy. 5. Mild paranasal sinus mucosal thickening. Trace left mastoid effusion. Electronically Signed   By: Kellie Simmering DO   On: 09/16/2019 18:14    Procedures Procedures (including critical care time)  Medications Ordered in ED Medications  sodium chloride 0.9 % bolus 1,000 mL (0 mLs Intravenous Stopped 09/16/19 1112)  insulin aspart (novoLOG) injection 10 Units (10 Units Intravenous Given 09/16/19 1255)  LORazepam (ATIVAN) injection 0.5 mg (0.5 mg Intravenous Given 09/16/19 1718)    ED Course  I have reviewed the triage vital signs and the nursing notes.  Pertinent labs & imaging results that were available during my care of the patient were reviewed by me and considered in my medical decision making (see chart for  details).  Clinical Course as of Sep 15 1833  Thu Sep 16, 2019  1048 Troponin I (High Sensitivity) [SP]    Clinical Course User Index [SP] Alfredia Client, PA-C   MDM Rules/Calculators/A&P                      Leron Stoffers is a 74 y.o. male with a past medical history of diabetes, CKD stage III, CAD, hypertension, MI, stroke that presents to the emergency department for new onset weakness, dizziness, headache that began last week. He has been medication noncompliant with his insulin for the past week. Started patient on fluids because his blood pressure was soft. EKG  showed  Nonspecific ST abnormality and possible bundle branch therefore will order delt troponins. Pt is not currenly complaing of chest pain, however he is a diabetic with PMH of MI and could be presenting with atypical MI.  Blood glucose 364, Pt does not have anion gap.   Pt does have symptoms concerning for stroke even though his symptoms have been occurring for over one week. Will order Head CT. Insulin given after second CBG resulted with glucose still in the 300s.  CT Head concerning for subacute infarct. MRI ordered. Dr. Jeanell Sparrow consulted neurology who advised admission. Vitals stable and improved with fluid.  I discussed this case with my attending physician, Dr. Jeanell Sparrow, who cosigned this note including patient's presenting symptoms, physical exam, and planned diagnostics and interventions. Attending physician stated agreement with plan or made changes to plan which were implemented. Attending physician assessed patient at bedside.   .At shift change care was transferred to Dr. Dallie Piles  who will follow pending studies. Plan is for admission.    Final Clinical Impression(s) / ED Diagnoses Final diagnoses:  Dizziness  Generalized weakness  Acute nonintractable headache, unspecified headache type    Rx / DC Orders ED Discharge Orders    None       Alfredia Client, PA-C 09/16/19 1850    Pattricia Boss, MD 09/17/19  1538

## 2019-09-16 NOTE — ED Notes (Signed)
Got patient on the monitor into a gown patient is resting with call bell in reach  

## 2019-09-16 NOTE — H&P (Addendum)
[facility] Admission History and Physical Service Pager: [phone]  Patient name: David Burton Medical record number: [id number] Date of birth: 12-12-1945 Age: 74 y.o. Gender: male  Primary Care Provider: Harvie Junior, MD Consultants: Neurology  Code Status: DO NOT RESUSCITATE Preferred Emergency Contact:  David Burton, Daughter, [phone]  Chief Complaint: Difficulty ambulating, left upper and lower extremity weakness  Assessment and Plan: David Burton is a 74 y.o. male presenting with generalized weakness with imaging findings concerning for acute CVA. PMH is significant for type 2 diabetes, stroke, HLD, MI, hypertension, GERD, CKD IIIB.   Acute/subacute [place] Stroke  Patient presented to ED with a one week history of left sided weakness associated with dizziness and balance difficulties. CT head with right pons hypoattenuation which was confirmed with MRI brain showing acute/early subacute infarct in the right pons. On exam, patient has 4/5 strength in left upper and lower extremity with mild left-sided finger-to-nose dysmetria. Patient with right-sided facial deficit secondary to recent prior diagnosis of Bell's palsy. Risk factors for CVA include obesity, DM, extensive cardiac history with CAD with stents.  Patient requires admission for stroke evaluation and observation.  - Admit to cardiac telemetry, attending Dr. Ardelia Burton  - Stroke team following, will appreciate recommendations - Cardiac monitoring - PT/OT/SLP - MRA Head  - Echo  - hemoglobin A1c, lipid panel - Continue home aspirin 81mg  w/ plavix 75mg   - Crestor 20mg  (transitioned from home simvastatin)  - Vitals every 2 hours for 12 hours - Tylenol as needed - neuro checks  - OOB with assist  Dizziness  Balance difficulties:  Suspect due to presenting compliant as above. However did additionally consider alternative etiology such as BPPV/meniere's/vestibular neuritis, but without  relation to position changes or associated hearing loss/tinnitus, these are much less likely concurrently present. While he does have a substantial cardiac history, reassuringly no chest pain or worsening DOE to suggest cardiac involvement. No history of arrhythmias or atrial fib but will monitor on telemetry overnight. Will have PT and OT eval as above to help rehabilitate patient's deficits.   T2DM, suspect poorly controlled   Last A1c within system is 11.1% in 2018 (patient's PCP does not use Epic). Glucose elevated to 390 on admission. However, no acidosis or clinical symptoms to suggest concern for DKA/HHS. Home medication includes 8-10 units with meals & Levemir 20 units at bedtime, metformin 500 twice daily. Patient recently self-discontinued his insulin earlier this week as he felt maybe his insulin was contributing to his presenting concerns. He reports difficulty complying with his diabetic regimen due to frequency, will simplify regimen for discharge. Many agents limited due to renal function.  - start Lantus 10units - qHS insulin 0-5 units  - sSSI - CBG checks with meals and at bedtime   CAD, 2 stents placed 01/26/2019  history of infarction  HFmrEF 40 - 45% Patient follows with [facility]. Recent placement of two DES to mid RCA in 01/2019. EF 40-45% with mild-mod mitral regurg on Echo 12/2018. Medications include aspirin 81 mg, Plavix 75 mg, Imdur 60 mg daily, hydralazine.  - repeat echocardiogram  - Aspirin 81 mg & Plavix 75 mg - hold Imdur and hydralazine for now to allow appropriate cranial perfusion  - I/Os  - daily weights   Acute kidney injury on chronic disease, stage IIIb On admission creatinine elevated at 2.63. BL appears to be around 1.5-2.0. Suspect likely pre-renal with recent osmotic diuresis in the setting of hyperglycemia.  - monitor with BMP -  S/p 1L, will given additional light hydration overnight  - Clarify if following with nephrology o/p, should  establish if not   Hypertension On admission, BP was initially 98/59 but improved to 121/64. BP was within normal limits and not hypertensive. Home medications include hydralazine 50 mg, amlodipine 5 mg daily, and imdur. --Hold home medications for now, will likely be able to restart in stepwise fashion given CVA acute/subacute in nature  Hyperlipidemia Patient on home simvastatin 40 mg.  Given acute CVA, will increase to high intensity.  - Start rosuvastatin 20 mg   GERD: Patient previous prescribed omeprazole 20 but reports he is not taking this. -We will monitor for symptoms  FEN/GI: Carb modified/ heart healthy  Prophylaxis: Lovenox  Disposition: admit to cardiac tele  History of Present Illness:  David Burton is a 74 y.o. male presenting with difficulty with ambulation and dizziness.   Patient reports a one week history of sudden onset weakness on his left side and feeling dizzy. He says he can still "use my left side but it's not as fast." Felt like he was "drunk walking" and off balance. Patient states he tends to lean to his right. Concurrent headache, anterior frontal, that has been present for the past few days. Reports occasionally blurry vision with "blue dots," that is chronic in nature. Thought his symptoms may have been due to using his insulin improperly or a "mini stroke" so he stopped taking his insulin around Mon/Tues of this week. Ultimately decided to seek evaluation for his concerns due to persistent symptoms (actually walked to the ED today). Denies any associated numbness, speech slurring, hearing loss, tinnitus. No falls. Endorses history of bells palsy on the right with residual difficulty closing his right eye and smiling completely.   Additionally, reports a several month history of non-progressive dyspnea on exertion. Denies any claudication sx, palpitations, chest pain. Follows with cardiology. Has put on ~ 25 lbs since moving downtown because he doesn't have to  walk around as much.    Lives by himself, apartment. Currently Non-smoker. Pint/week of bourbon. Denies any withdrawal symptoms in the past, says he can go two weeks without concern. 20 year history of alcohol use, reports last drink was a few days ago (Monday or Tuesday).    Review Of Systems: Per HPI with the following additions:   Review of Systems  Constitutional: Negative for chills, fever and unexpected weight change.  Respiratory: Negative for chest tightness.   Cardiovascular: Negative for chest pain, palpitations and leg swelling.  Gastrointestinal: Positive for constipation. Negative for abdominal pain and diarrhea.  Genitourinary: Negative for dysuria and hematuria.  Neurological: Positive for dizziness, facial asymmetry, weakness, light-headedness and headaches. Negative for seizures and numbness.     Patient Active Problem List   Diagnosis Date Noted  . Right pontine stroke ([place]) 09/16/2019  . Hypertension with heart disease 12/16/2018  . CAD (coronary artery disease), native coronary artery 12/16/2018  . CKD (chronic kidney disease), stage III 12/16/2018  . Pure hypercholesterolemia 12/16/2018  . Type 2 diabetes, controlled, with peripheral neuropathy ([place]) 12/16/2018    Past Medical History: Past Medical History:  Diagnosis Date  . Bell palsy   . CKD (chronic kidney disease), stage III 12/16/2018  . Coronary artery disease involving native coronary artery of native heart with unstable angina pectoris ([place]) 12/16/2018  . Diabetes mellitus   . Diabetes mellitus without complication ([place])   . GERD (gastroesophageal reflux disease)   . Hypertension   . Hypertension with heart  disease 12/16/2018  . Myocardial infarction ([place])   . Pure hypercholesterolemia 12/16/2018  . Stroke ([place])   . Type 2 diabetes, controlled, with peripheral neuropathy ([place]) 12/16/2018    Past Surgical History: Past Surgical History:  Procedure Laterality Date  . CARDIAC SURGERY    .  COLONOSCOPY WITH PROPOFOL N/A 01/02/2018   Procedure: COLONOSCOPY WITH PROPOFOL;  Surgeon: Carol Ada, MD;  Location: [facility];  Service: Endoscopy;  Laterality: N/A;  . CORONARY STENT INTERVENTION N/A 01/26/2019   Procedure: CORONARY STENT INTERVENTION;  Surgeon: Adrian Prows, MD;  Location: [facility];  Service: Cardiovascular;  Laterality: N/A;  . LEFT HEART CATH AND CORS/GRAFTS ANGIOGRAPHY N/A 01/05/2019   Procedure: LEFT HEART CATH AND CORS/GRAFTS ANGIOGRAPHY;  Surgeon: Adrian Prows, MD;  Location: [facility];  Service: Cardiovascular;  Laterality: N/A;  . POLYPECTOMY  01/02/2018   Procedure: POLYPECTOMY;  Surgeon: Carol Ada, MD;  Location: [facility];  Service: Endoscopy;;  . stents      Social History: Social History   Tobacco Use  . Smoking status: Former Smoker    Packs/day: 1.00    Years: 5.00    Pack years: 5.00    Types: Cigarettes    Quit date: 2005    Years since quitting: 16.2  . Smokeless tobacco: Never Used  Substance Use Topics  . Alcohol use: Yes    Comment: 2/week, whiskey with [place]  . Drug use: No    Family History: Family History  Problem Relation Age of Onset  . Heart disease Other   . Cancer Mother   . Heart attack Father     Allergies and Medications: No Known Allergies No current facility-administered medications on file prior to encounter.   Current Outpatient Medications on File Prior to Encounter  Medication Sig Dispense Refill  . aspirin EC 81 MG tablet Take 243 mg by mouth daily.     Marland Kitchen HYDROcodone-acetaminophen (NORCO) 10-325 MG tablet Take 1 tablet by mouth 3 (three) times daily as needed for moderate pain.     Marland Kitchen ibuprofen (ADVIL,MOTRIN) 200 MG tablet Take 400 mg by mouth every 6 (six) hours as needed for headache or moderate pain.     Marland Kitchen insulin aspart (NOVOLOG) 100 UNIT/ML FlexPen Inject 8-10 Units into the skin 3 (three) times daily with meals.     . insulin detemir (LEVEMIR) 100 UNIT/ML injection Inject 20 Units  into the skin at bedtime.     . metFORMIN (GLUCOPHAGE) 500 MG tablet Take 1 tablet (500 mg total) by mouth 2 (two) times daily with a meal.    . nitroGLYCERIN (NITROSTAT) 0.4 MG SL tablet Place 1 tablet (0.4 mg total) under the tongue every 5 (five) minutes as needed for chest pain. 15 tablet 1  . simvastatin (ZOCOR) 40 MG tablet Take 40 mg by mouth at bedtime.    Marland Kitchen amLODipine (NORVASC) 5 MG tablet Take 1 tablet (5 mg total) by mouth daily. 90 tablet 2  . clopidogrel (PLAVIX) 75 MG tablet Take 1 tablet (75 mg total) by mouth daily. (Patient not taking: Reported on 09/16/2019) 30 tablet 11  . hydrALAZINE (APRESOLINE) 50 MG tablet Take 1 tablet (50 mg total) by mouth 3 (three) times daily. 90 tablet 2  . isosorbide mononitrate (IMDUR) 60 MG 24 hr tablet TAKE 1 TABLET(60 MG) BY MOUTH DAILY (Patient not taking: No sig reported) 90 tablet 3  . omeprazole (PRILOSEC) 20 MG capsule Take 1 capsule (20 mg total) by mouth daily. (Patient not taking:  Reported on 09/16/2019) 14 capsule 0    Objective: BP 121/64   Pulse 74   Temp 98.5 F (36.9 C) (Oral)   Resp 19   Ht 5\' 6"  (1.676 m)   Wt 113.4 kg   SpO2 91%   BMI 40.35 kg/m   Exam:  General: Male appearing stated age, sitting up in bed in no acute distress Eyes: No scleral icterus or conjunctival injection, right eye with lid lag ENTM: Right ear with cerumen impaction, left tympanic membrane with normal light reflex and some cerumen Cardiovascular: Regular rate and rhythm, no murmurs Respiratory: Clear to auscultation bilaterally without wheezing, no crackles, stable on room air Gastrointestinal: Obese abdomen, bowel sounds positive, no tenderness to palpation MSK: Moves extremities with normal range of motion, surgical scar on left medial lower extremity Derm: No lesions or ulcerations Neuro: Alert and oriented, follows commands appropriately. Intact sensation in bilateral upper and lower extremities and face, right side face without forehead  referral, right eyelid lag and loss of labial fold on the right (residual of Bell's palsy) 4/5 strength in upper and lower extremities, cranial nerves II-XII intact bilaterally grossly. Mild dysmetria with finger-to-nose test on the left, normal on right. No pronator drift. Intact heel-to-shin bilaterally.   Labs and Imaging: CBC BMET  Recent Labs  Lab 09/16/19 0835  WBC 9.6  HGB 15.2  HCT 45.2  PLT 165   Recent Labs  Lab 09/16/19 0835  NA 136  K 4.8  CL 99  CO2 26  BUN 51*  CREATININE 2.63*  GLUCOSE 390*  CALCIUM 9.6     EKG: Normal sinus rhythm, Left axis deviation, Incomplete right bundle branch block  CT Head Wo Contrast  Result Date: 09/16/2019 CLINICAL DATA:  Dizziness, nonspecific EXAM: CT HEAD WITHOUT CONTRAST TECHNIQUE: Contiguous axial images were obtained from the base of the skull through the vertex without intravenous contrast. COMPARISON:  MRI 07/10/2011 (images only) FINDINGS: Brain: There is a focal region of hypoattenuation in the right pons (2/10) given the patient's symptom onset timing, a subacute infarct is not excluded. Additional hypoattenuation centrally in the medulla has an appearance more compatible with streak artifact from the skull base. No other areas concerning for acute infarction. No hyperdense hemorrhage. No extra-axial collection, mass effect or midline shift. No frank hydrocephaly. Patchy areas of white matter hypoattenuation are most compatible with chronic microvascular angiopathy. Symmetric prominence of the ventricles, cisterns and sulci compatible with parenchymal volume loss. Vascular: Atherosclerotic calcification of the carotid siphons and intradural vertebral arteries. No hyperdense vessel. Skull: No calvarial fracture or suspicious osseous lesion. No scalp swelling or hematoma. Scattered benign-appearing dermal calcifications are noted. Sinuses/Orbits: Mild mural thickening in the ethmoid air cells. Remaining paranasal sinuses are  predominantly clear. Slightly hypo pneumatized appearance of the mastoid air cells with a trace right mastoid effusion. Extensive debris noted in the bilateral external auditory canals including complete impaction of the right external auditory canal. Other: None IMPRESSION: 1. Focal region of hypoattenuation in the right pons (2/10) given the patient's symptom onset timing, finding raises suspicion for a subacute infarct additional hypoattenuation centrally in the medulla has an appearance more compatible with streak artifact from the skull base. Consider further evaluation with MRI. 2. Chronic microvascular angiopathy and parenchymal volume loss. 3. Extensive debris in the bilateral external auditory canals including complete impaction of the right external auditory canal. Correlate with visual inspection for cerumen impaction. 4. Trace right mastoid effusion. Electronically Signed   By: Elwin Sleight.D.  On: 09/16/2019 15:25   MR BRAIN WO CONTRAST  Result Date: 09/16/2019 CLINICAL DATA:  Neuro deficit, acute, stroke suspected. Additional history provided: Difficulties with balance and walking for the past week EXAM: MRI HEAD WITHOUT CONTRAST TECHNIQUE: Multiplanar, multiecho pulse sequences of the brain and surrounding structures were obtained without intravenous contrast. COMPARISON:  Non-contrast head CT performed earlier the same day 09/16/2019 FINDINGS: Brain: Mild intermittent motion degradation. There is a 17 x 17 mm focus of restricted diffusion within the right pons consistent with acute/early subacute infarct. Corresponding T2/FLAIR hyperintensity at this site. There is no evidence of intracranial mass. No midline shift or extra-axial fluid collection. No chronic intracranial blood products. Mild scattered T2/FLAIR hyperintensity within the cerebral white matter and pons is nonspecific, but consistent with chronic small vessel ischemic disease. Small chronic lacunar infarct within the left  cerebellar hemisphere. Mild generalized parenchymal atrophy. Vascular: Flow voids maintained within the proximal large arterial vessels. Skull and upper cervical spine: No focal marrow lesion. Sinuses/Orbits: Visualized orbits demonstrate no acute abnormality. Mild paranasal sinus mucosal thickening. Trace fluid within left mastoid air cells. IMPRESSION: 1. Mildly motion degraded exam. 2. Confirmed 17 mm acute/early subacute infarct within the right pons. 3. Background mild chronic small vessel ischemic disease within the cerebral white matter and pons. Tiny chronic lacunar infarct within the left cerebellum. 4. Mild generalized parenchymal atrophy. 5. Mild paranasal sinus mucosal thickening. Trace left mastoid effusion. Electronically Signed   By: Kellie Simmering DO   On: 09/16/2019 18:14   Stark Klein, MD 09/16/2019, 8:39 PM PGY-1, [place] Intern pager: [phone], text pages welcome  FPTS Upper-Level Resident Addendum   I have independently interviewed and examined the patient. I have discussed the above with the original author and agree with their documentation. My edits for correction/addition/clarification are in green. Please see also any attending notes.    Patriciaann Clan, DO  Family Medicine PGY-2

## 2019-09-16 NOTE — ED Triage Notes (Signed)
Pt reports 2 days of weakness, dizziness and headache. States he has a hard time walking due to the weakness. Pt a.o

## 2019-09-16 NOTE — ED Notes (Signed)
Patient transported to MRI 

## 2019-09-16 NOTE — ED Notes (Signed)
Pt back from MRI 

## 2019-09-16 NOTE — ED Notes (Signed)
Asked patient to get a urine sample patient stated that he couldn't at this time will try later

## 2019-09-16 NOTE — Progress Notes (Addendum)
Family Medicine Teaching Service Daily Progress Note Intern Pager: [phone]  Patient name: David Burton Medical record number: [id number] Date of birth: 1945-11-21 Age: 74 y.o. Gender: male  Primary Care Provider: Harvie Junior, MD Consultants: Neurology Code Status: Full code  Pt Overview and Major Events to Date:  4/15: Admitted, acute pontine stroke  Assessment and Plan: David Burton is a 74 y.o. male presenting with generalized weakness with imaging findings concerning for acute CVA. PMH is significant for type 2 diabetes,  HLD, MI, hypertension, GERD, CKD IIIB.   Acute/subacute Pontine Stroke  Patient reports same strength in his left upper and lower extremity.  Neurological exam remains unchanged from overnight with continued mild dysmetria on the left, 4/5 strength in upper and lower extremity, 5/5 strength in right upper and lower extremity, sensation remains intact.  Mental status still alert and oriented x4.  Patient with no new complaints today.  Risk stratification labs notable for lipid panel with elevated triglycerides of 269.  Hemoglobin A1c elevated at 10.8.  MRA with no abnormalities.  No hypertension overnight. -Neurology following, appreciate recommendations -Follow-up repeat echocardiogram -Plavix 75 mg and aspirin 81 mg -Vascular ultrasound carotid lateral -Follow-up PT/OT recommendations -Follow-up speech evaluation - switch to rosuvastatin, 40 mg  -Vitals per stroke protocol - f/u echocardiogram   Dizziness  Balance difficulties:  Patient reports that he continues to experience some dizziness when attempting to ambulate.  Denies lightheadedness while lying in bed. -Orthostatic vitals -Follow-up PT/OT recommendations  T2DM Blood glucose range from 226-270 overnight.  Patient started on Lantus 10 units.  Home regimen includes Lantus 20 units.  Patient received 12 units of fast acting insulin overnight -Sensitive sliding scale - Increase lantus to 15 units  today  - CBG with meals and at bed time  - qHS insulin coverage  - recommend starting trulicity   CAD, 2 stents placed 01/26/2019  history of infarction  HFmrEF 40 - 45% Last echocardiogram in 2020 following patient's stent placement with ejection fraction of 40-45%, mild mitral regurgitation and pulmonary artery pressure within normal limits at 21 mmHg.  Patient is not reporting chest pain at this time.  Weight not recorded today.  Urine output measured overnight. -Follow-up repeat echocardiogram  Acute on Chronic Kidney disease  Creatinine on admission elevated at 2.63.  Baseline appears to be between 1.41.5.  Creatinine improved this morning to 2.19.  GFR 84. -Continue maintenance IV fluids at 100 mL/h -Continue to monitor creatinine with CMP   HTN, stable BP range from low to within normal limits overnight.  Last blood pressure 112/47.  Systolic ranged from 14-782. -Monitor blood pressure with vitals  HLD  Lipid panel with elevated triglycerides 269, LDL 74, HDL low 25 and total cholesterol 153. -Switch from moderate intensity simvastatin to high intensity rosuvastatin 20 mg GERD   FEN/GI: N.p.o., carb modified diet once passed swallow eval PPx: Lovenox  Disposition: Discharge pending completion of stroke evaluation, likely late afternoon 4/16 or 4/17 pending completion of stroke evaluation   Subjective:  Patient states that he continues to have some dizziness, denies chest pain, denies shortness of breath, denies headache or blurry vision. Still has some weakness but feels better from a muscle soreness standpoint. Patient is now denying SI.   Objective: Pulse Rate:  [63-108] 80 (04/16 0845) Resp:  [16-29] 20 (04/16 0845) BP: (106-125)/(47-81) 115/81 (04/16 0845) SpO2:  [91 %-98 %] 97 % (04/16 0845)  Physical Exam: General: Obese male lying in bed in no acute distress watching  TV Cardiovascular: Regular rate and rhythm, no murmurs Respiratory: Clear to auscultation  without wheezing or crackles, stable on room air Abdomen: Obese abdomen nontender to palpation with bowel sounds present throughout Extremities: No lower extremity edema Neuro: 4/5 strength in left upper and lower extremities, sensation intact throughout, right side of face without forehead furrow and loss of labial fold, did not note dysarthria, left side of mouth does not participate in smile  Laboratory: Recent Labs  Lab 09/16/19 0835  WBC 9.6  HGB 15.2  HCT 45.2  PLT 165   Recent Labs  Lab 09/16/19 0835 09/17/19 0202  NA 136 139  K 4.8 4.9  CL 99 105  CO2 26 22  BUN 51* 58*  CREATININE 2.63* 2.19*  CALCIUM 9.6 9.3  PROT 7.6  --   BILITOT 1.0  --   ALKPHOS 87  --   ALT 22  --   AST 17  --   GLUCOSE 390* 207*      Imaging/Diagnostic Tests: CT Head Wo Contrast  Result Date: 09/16/2019 CLINICAL DATA:  Dizziness, nonspecific EXAM: CT HEAD WITHOUT CONTRAST TECHNIQUE: Contiguous axial images were obtained from the base of the skull through the vertex without intravenous contrast. COMPARISON:  MRI 07/10/2011 (images only) FINDINGS: Brain: There is a focal region of hypoattenuation in the right pons (2/10) given the patient's symptom onset timing, a subacute infarct is not excluded. Additional hypoattenuation centrally in the medulla has an appearance more compatible with streak artifact from the skull base. No other areas concerning for acute infarction. No hyperdense hemorrhage. No extra-axial collection, mass effect or midline shift. No frank hydrocephaly. Patchy areas of white matter hypoattenuation are most compatible with chronic microvascular angiopathy. Symmetric prominence of the ventricles, cisterns and sulci compatible with parenchymal volume loss. Vascular: Atherosclerotic calcification of the carotid siphons and intradural vertebral arteries. No hyperdense vessel. Skull: No calvarial fracture or suspicious osseous lesion. No scalp swelling or hematoma. Scattered  benign-appearing dermal calcifications are noted. Sinuses/Orbits: Mild mural thickening in the ethmoid air cells. Remaining paranasal sinuses are predominantly clear. Slightly hypo pneumatized appearance of the mastoid air cells with a trace right mastoid effusion. Extensive debris noted in the bilateral external auditory canals including complete impaction of the right external auditory canal. Other: None IMPRESSION: 1. Focal region of hypoattenuation in the right pons (2/10) given the patient's symptom onset timing, finding raises suspicion for a subacute infarct additional hypoattenuation centrally in the medulla has an appearance more compatible with streak artifact from the skull base. Consider further evaluation with MRI. 2. Chronic microvascular angiopathy and parenchymal volume loss. 3. Extensive debris in the bilateral external auditory canals including complete impaction of the right external auditory canal. Correlate with visual inspection for cerumen impaction. 4. Trace right mastoid effusion. Electronically Signed   By: Lovena Le M.D.   On: 09/16/2019 15:25   MR ANGIO HEAD WO CONTRAST  Result Date: 09/16/2019 CLINICAL DATA:  Ataxia EXAM: MRA HEAD WITHOUT CONTRAST TECHNIQUE: Angiographic images of the Circle of Willis were obtained using MRA technique without intravenous contrast. COMPARISON:  Brain MRI same day FINDINGS: POSTERIOR CIRCULATION: --Vertebral arteries: Normal V4 segments. --Posterior inferior cerebellar arteries (PICA): Patent origins from the vertebral arteries. --Anterior inferior cerebellar arteries (AICA): Patent origins from the basilar artery. --Basilar artery: Normal. --Superior cerebellar arteries: Normal. --Posterior cerebral arteries: Normal. The left PCA is predominantly supplied by the posterior communicating artery. ANTERIOR CIRCULATION: --Intracranial internal carotid arteries: Normal. --Anterior cerebral arteries (ACA): Normal. Both A1 segments are present.  Patent  anterior communicating artery (a-comm). --Middle cerebral arteries (MCA): Normal. IMPRESSION: Normal brain MRA. Electronically Signed   By: Ulyses Jarred M.D.   On: 09/16/2019 22:55   MR BRAIN WO CONTRAST  Result Date: 09/16/2019 CLINICAL DATA:  Neuro deficit, acute, stroke suspected. Additional history provided: Difficulties with balance and walking for the past week EXAM: MRI HEAD WITHOUT CONTRAST TECHNIQUE: Multiplanar, multiecho pulse sequences of the brain and surrounding structures were obtained without intravenous contrast. COMPARISON:  Non-contrast head CT performed earlier the same day 09/16/2019 FINDINGS: Brain: Mild intermittent motion degradation. There is a 17 x 17 mm focus of restricted diffusion within the right pons consistent with acute/early subacute infarct. Corresponding T2/FLAIR hyperintensity at this site. There is no evidence of intracranial mass. No midline shift or extra-axial fluid collection. No chronic intracranial blood products. Mild scattered T2/FLAIR hyperintensity within the cerebral white matter and pons is nonspecific, but consistent with chronic small vessel ischemic disease. Small chronic lacunar infarct within the left cerebellar hemisphere. Mild generalized parenchymal atrophy. Vascular: Flow voids maintained within the proximal large arterial vessels. Skull and upper cervical spine: No focal marrow lesion. Sinuses/Orbits: Visualized orbits demonstrate no acute abnormality. Mild paranasal sinus mucosal thickening. Trace fluid within left mastoid air cells. IMPRESSION: 1. Mildly motion degraded exam. 2. Confirmed 17 mm acute/early subacute infarct within the right pons. 3. Background mild chronic small vessel ischemic disease within the cerebral white matter and pons. Tiny chronic lacunar infarct within the left cerebellum. 4. Mild generalized parenchymal atrophy. 5. Mild paranasal sinus mucosal thickening. Trace left mastoid effusion. Electronically Signed   By: Kellie Simmering DO   On: 09/16/2019 18:14   VAS US CAROTID  Result Date: 09/17/2019 Carotid Arterial Duplex Study Indications:       CVA. Risk Factors:      Hypertension, Diabetes, coronary artery disease, prior CVA. Limitations        Today's exam was limited due to the body habitus of the                    patient. Comparison Study:  no prior Performing Technologist: Abram Sander RVS  Examination Guidelines: A complete evaluation includes B-mode imaging, spectral Doppler, color Doppler, and power Doppler as needed of all accessible portions of each vessel. Bilateral testing is considered an integral part of a complete examination. Limited examinations for reoccurring indications may be performed as noted.  Right Carotid Findings: +----------+--------+--------+--------+------------------+--------+           PSV cm/sEDV cm/sStenosisPlaque DescriptionComments +----------+--------+--------+--------+------------------+--------+ CCA Prox  83      11              heterogenous               +----------+--------+--------+--------+------------------+--------+ CCA Distal80      9               heterogenous               +----------+--------+--------+--------+------------------+--------+ ICA Prox  76      22      1-39%   heterogenous               +----------+--------+--------+--------+------------------+--------+ ICA Distal49      13                                         +----------+--------+--------+--------+------------------+--------+ ECA  62                                                 +----------+--------+--------+--------+------------------+--------+ +----------+--------+-------+--------+-------------------+           PSV cm/sEDV cmsDescribeArm Pressure (mmHG) +----------+--------+-------+--------+-------------------+ [id number]                                         +----------+--------+-------+--------+-------------------+  +---------+--------+--------+------------+ VertebralPSV cm/sEDV cm/sNot assessed +---------+--------+--------+------------+  Left Carotid Findings: +----------+--------+--------+--------+------------------+--------+           PSV cm/sEDV cm/sStenosisPlaque DescriptionComments +----------+--------+--------+--------+------------------+--------+ CCA Prox  140     17              heterogenous               +----------+--------+--------+--------+------------------+--------+ CCA Distal62      13              heterogenous               +----------+--------+--------+--------+------------------+--------+ ICA Prox  65      20      1-39%   heterogenous               +----------+--------+--------+--------+------------------+--------+ ICA Distal54      18                                         +----------+--------+--------+--------+------------------+--------+ ECA       64      5                                          +----------+--------+--------+--------+------------------+--------+ +----------+--------+--------+--------+-------------------+           PSV cm/sEDV cm/sDescribeArm Pressure (mmHG) +----------+--------+--------+--------+-------------------+ [id number]                                          +----------+--------+--------+--------+-------------------+ +---------+--------+--+--------+-+---------+ VertebralPSV cm/s35EDV cm/s8Antegrade +---------+--------+--+--------+-+---------+   Summary: Right Carotid: Velocities in the right ICA are consistent with a 1-39% stenosis. Left Carotid: Velocities in the left ICA are consistent with a 1-39% stenosis. Vertebrals: Left vertebral artery demonstrates antegrade flow. Right vertebral             artery was not visualized. *See table(s) above for measurements and observations.     Preliminary    Stark Klein, MD 09/17/2019, 11:53 AM PGY-1, [place] Intern pager: [phone], text  pages welcome

## 2019-09-16 NOTE — Progress Notes (Deleted)
. Neurology Consultation Reason for Consult: Generalize weaknes Referring Physician: Harvie Junior, MD  CC: Patient  is a 74 year old male evaluated for generalize weekness.    History is obtained from: Patient  HPI: David Burton is a 74 y.o. male Extremity Weakness  This is a new problem. The current episode started in the past 7 days. There has been no history of extremity trauma (Patient states in the last week, he noticed he was unable to pick up his left leg when walking and he dragged it along while walking. patient did not get help for the weakness but decided to watch for worsening symptoms. He reports new onset headache, ). The problem occurs constantly (Patient reports flashes of light in visual fields, denies nausea, numbness and tingling.). The problem has been unchanged. The symptoms are aggravated by standing and activity. He has tried nothing for the symptoms. His past medical history is significant for diabetes.  Patient has a history of bels palsy and attributes that to his right side eye/facial drop. Patients history is significant for diabetes, hypertension, stroke, CAD and bels palsy.  LKW: 7 days ago tpa given?: no: out of therapeutic window  Review of Systems  Constitutional: Negative.   HENT: Negative.   Eyes: Negative.   Respiratory: Negative.   Cardiovascular: Negative.   Gastrointestinal: Negative.   Genitourinary: Negative.   Musculoskeletal: Positive for extremity weakness. Negative for falls.  Skin: Negative.   Neurological: Positive for weakness and headaches.       Left upper and lower extremity weakness  Psychiatric/Behavioral: Negative.      Past Medical History:  Diagnosis Date  . Bell palsy   . CKD (chronic kidney disease), stage III 12/16/2018  . Coronary artery disease involving native coronary artery of native heart with unstable angina pectoris ([place]) 12/16/2018  . Diabetes mellitus   . Diabetes mellitus without complication ([place])   .  GERD (gastroesophageal reflux disease)   . Hypertension   . Hypertension with heart disease 12/16/2018  . Myocardial infarction ([place])   . Pure hypercholesterolemia 12/16/2018  . Stroke ([place])   . Type 2 diabetes, controlled, with peripheral neuropathy ([place]) 12/16/2018    Family History  Problem Relation Age of Onset  . Heart disease Other   . Cancer Mother   . Heart attack Father     Social History:  reports that he quit smoking about 16 years ago. His smoking use included cigarettes. He has a 5.00 pack-year smoking history. He has never used smokeless tobacco. He reports current alcohol use. He reports that he does not use drugs.   Exam: Current vital signs: There were no vitals taken for this visit. Vital signs in last 24 hours: @VSRANGES @   Physical Exam Physical Exam  Constitutional: He is oriented to person, place, and time. He appears well-developed and well-nourished.  Eyes: Conjunctivae are normal.  Musculoskeletal:     Cervical back: Neck supple.     Comments: Left upper and lower extremity weakness  Neurological: He is alert and oriented to person, place, and time.  Flashes of light in bilateral visual field  Psych: Affect appropriate to situation Eyes: No scleral injection HENT: No OP obstrucion MSK: no joint deformities.  Cardiovascular: Normal rate and regular rhythm.  Respiratory: Effort normal, non-labored breathing GI: Soft.  No distension. There is no tenderness.  Skin: WDI  Neuro: Mental Status: Patient is awake, alert, oriented to person, place, month, year, and situation. Patient is able to give a clear  and coherent history. No signs of aphasia or neglect Cranial Nerves: II: Visual Fields are full. Pupils are equal, round, and reactive to light.  III,IV, VI: no diploplia. (Ptosis present in right eye) V: Facial sensation is symmetric to temperature VII: Facial movement is symmetric.  VIII: hearing is intact to voice X: Uvula elevates  symmetrically XI: Shoulder shrug is symmetric. XII: tongue is midline without atrophy or fasciculations.  Motor: Tone is normal. Bulk is normal. 3/5 strength was present in all four extremities. Left arm and left foot weakness Sensory: Sensation is symmetric to light touch and temperature in the arms and legs. Deep Tendon Reflexes: 2+ and symmetric in the biceps and patellae.  Plantars: Toes are downgoing bilaterally.  Cerebellar: FNF and HKS are intact bilaterall      I have reviewed labs in epic and the results pertinent to this consultation are:   I have reviewed the images obtained:  MRI  Pending  Brain: There is a focal region of hypoattenuation in the right pons (2/10) given the patient's symptom onset timing, a subacute infarct is not excluded. Additional hypoattenuation centrally in the medulla has an appearance more compatible with streak artifact from the skull base. No other areas concerning for acute infarction. No hyperdense hemorrhage. No extra-axial collection, mass effect or midline shift. No frank hydrocephaly. Patchy areas of white matter hypoattenuation are most compatible with chronic microvascular angiopathy. Symmetric prominence of the ventricles, cisterns and sulci compatible with parenchymal volume loss.  Vascular: Atherosclerotic calcification of the carotid siphons and intradural vertebral arteries. No hyperdense vessel.  Skull: No calvarial fracture or suspicious osseous lesion. No scalp swelling or hematoma. Scattered benign-appearing dermal calcifications are noted.  Sinuses/Orbits: Mild mural thickening in the ethmoid air cells. Remaining paranasal sinuses are predominantly clear. Slightly hypo pneumatized appearance of the mastoid air cells with a trace right mastoid effusion. Extensive debris noted in the bilateral external auditory canals including complete impaction of the right external auditory canal.  Impression:   Patient is  a 74 year old male, who present with generalize weakness, stroke like symptoms. It is most likely a subacute infarct based on the hypoattenuation in the right pons on CT.   Recommendations: - subacute stroke work up  -MRI _PT/ OT    @MPKSIGN @

## 2019-09-16 NOTE — ED Provider Notes (Signed)
74 year old man presents today complaining of difficulties with balance and walking.  He feels like he is leaning to the side for the past week.  He denies any other lateralized symptoms, vision changes, or difficulty speaking. Head CT obtained here shows probable subacute infarct.  Patient care discussed with Dr. Leonel Ramsay for neurology and advises admission for further evaluation. Discussed with Dr. Higinio Plan, on-call for family practice and they will see for admission.   Pattricia Boss, MD 09/16/19 [phone]

## 2019-09-16 NOTE — Consult Note (Signed)
Neurology Consultation Reason for Consult: Left-sided weakness Referring Physician: Jeanell Sparrow, D  CC: Left-sided weakness  History is obtained from: Patient  HPI: David Burton is a 74 y.o. male with a history of multiple stroke risk factors including hypertension, diabetes, hypercholesterolemia who presents with difficulty walking and left-sided weakness for the past week.  He initially did not come into the hospital because he was hoping it would get better, but when he had no improvement over the course of a week he finally sought care in the emergency department.  While in the ER, a CT was performed which demonstrates right pontine stroke.   LKW: 1 week ago tpa given?: no, outside of window   ROS: A 14 point ROS was performed and is negative except as noted in the HPI.   Past Medical History:  Diagnosis Date  . Bell palsy   . CKD (chronic kidney disease), stage III 12/16/2018  . Coronary artery disease involving native coronary artery of native heart with unstable angina pectoris ([place]) 12/16/2018  . Diabetes mellitus   . Diabetes mellitus without complication ([place])   . GERD (gastroesophageal reflux disease)   . Hypertension   . Hypertension with heart disease 12/16/2018  . Myocardial infarction ([place])   . Pure hypercholesterolemia 12/16/2018  . Stroke ([place])   . Type 2 diabetes, controlled, with peripheral neuropathy ([place]) 12/16/2018     Family History  Problem Relation Age of Onset  . Heart disease Other   . Cancer Mother   . Heart attack Father      Social History:  reports that he quit smoking about 16 years ago. His smoking use included cigarettes. He has a 5.00 pack-year smoking history. He has never used smokeless tobacco. He reports current alcohol use. He reports that he does not use drugs.   Exam: Current vital signs: BP 121/64   Pulse 74   Temp 98.5 F (36.9 C) (Oral)   Resp 19   Ht 5\' 6"  (1.676 m)   Wt 113.4 kg   SpO2 91%   BMI 40.35 kg/m  Vital signs in last  24 hours: Temp:  [98.5 F (36.9 C)] 98.5 F (36.9 C) (04/15 0808) Pulse Rate:  [57-108] 74 (04/15 1900) Resp:  [14-22] 19 (04/15 1900) BP: (96-125)/(57-78) 121/64 (04/15 1900) SpO2:  [91 %-100 %] 91 % (04/15 1900) Weight:  [113.4 kg] 113.4 kg (04/15 0808)   Physical Exam  Constitutional: Appears well-developed and well-nourished.  Psych: Affect appropriate to situation Eyes: No scleral injection HENT: No OP obstrucion MSK: no joint deformities.  Cardiovascular: Normal rate and regular rhythm.  Respiratory: Effort normal, non-labored breathing GI: Soft.  No distension. There is no tenderness.  Skin: WDI  Neuro: Mental Status: Patient is awake, alert, oriented to person, place, month, year, and situation. Patient is able to give a clear and coherent history. No signs of aphasia or neglect Cranial Nerves: II: Visual Fields are full. Pupils are equal, round, and reactive to light.   III,IV, VI: EOMI without ptosis or diploplia.  V: Facial sensation is symmetric to temperature VII: Facial movement is diminished on the right (old Bell's palsy) VIII: hearing is intact to voice X: Uvula elevates symmetrically XI: Shoulder shrug is symmetric. XII: tongue is midline without atrophy or fasciculations.  Motor: Tone is normal. Bulk is normal. 5/5 strength was present on the right, he has 4/5 weakness left arm and leg Sensory: Sensation is symmetric to light touch and temperature in the arms and legs. Cerebellar: He is  dysmetria out of proportion to weakness in the left arm.      I have reviewed labs in epic and the results pertinent to this consultation are: Creatinine 2.6  I have reviewed the images obtained: MRI brain-pontine infarct  Impression: 75 year old male with likely small vessel infarct in the pons.  He is being admitted for physical therapy and secondary risk factor modification.  Recommendations: - HgbA1c, fasting lipid panel - MRA  of the brain without  contrast - Frequent neuro checks - Echocardiogram - Prophylactic therapy-Antiplatelet med: Aspirin 81 mg, and Plavix 75 mg daily - Risk factor modification - Telemetry monitoring - PT consult, OT consult, Speech consult - Stroke team to follow   Roland Rack, MD [facility] [phone]  If 7pm- 7am, please page neurology on call as listed in [place].

## 2019-09-16 NOTE — ED Notes (Signed)
Pt transported to MRI 

## 2019-09-17 ENCOUNTER — Encounter (HOSPITAL_COMMUNITY): Payer: Self-pay | Admitting: Nurse Practitioner

## 2019-09-17 ENCOUNTER — Observation Stay (HOSPITAL_COMMUNITY): Payer: Medicare HMO

## 2019-09-17 DIAGNOSIS — M25422 Effusion, left elbow: Secondary | ICD-10-CM | POA: Diagnosis not present

## 2019-09-17 DIAGNOSIS — I6350 Cerebral infarction due to unspecified occlusion or stenosis of unspecified cerebral artery: Secondary | ICD-10-CM

## 2019-09-17 DIAGNOSIS — R531 Weakness: Secondary | ICD-10-CM | POA: Diagnosis not present

## 2019-09-17 LAB — URINALYSIS, ROUTINE W REFLEX MICROSCOPIC
Bilirubin Urine: NEGATIVE
Glucose, UA: 150 mg/dL — AB
Hgb urine dipstick: NEGATIVE
Ketones, ur: NEGATIVE mg/dL
Nitrite: NEGATIVE
Protein, ur: NEGATIVE mg/dL
Specific Gravity, Urine: 1.017 (ref 1.005–1.030)
pH: 5 (ref 5.0–8.0)

## 2019-09-17 LAB — GLUCOSE, CAPILLARY
Glucose-Capillary: 256 mg/dL — ABNORMAL HIGH (ref 70–99)
Glucose-Capillary: 245 mg/dL — ABNORMAL HIGH (ref 70–99)

## 2019-09-17 LAB — BASIC METABOLIC PANEL
Anion gap: 12 (ref 5–15)
BUN: 58 mg/dL — ABNORMAL HIGH (ref 8–23)
CO2: 22 mmol/L (ref 22–32)
Calcium: 9.3 mg/dL (ref 8.9–10.3)
Chloride: 105 mmol/L (ref 98–111)
Creatinine, Ser: 2.19 mg/dL — ABNORMAL HIGH (ref 0.61–1.24)
GFR calc Af Amer: 34 mL/min — ABNORMAL LOW (ref >60)
GFR calc non Af Amer: 29 mL/min — ABNORMAL LOW (ref >60)
Glucose, Bld: 207 mg/dL — ABNORMAL HIGH (ref 70–99)
Potassium: 4.9 mmol/L (ref 3.5–5.1)
Sodium: 139 mmol/L (ref 135–145)

## 2019-09-17 LAB — HEMOGLOBIN A1C
Hgb A1c MFr Bld: 10.8 % — ABNORMAL HIGH (ref 4.8–5.6)
Mean Plasma Glucose: 263.26 mg/dL

## 2019-09-17 LAB — CBG MONITORING, ED
Glucose-Capillary: 214 mg/dL — ABNORMAL HIGH (ref 70–99)
Glucose-Capillary: 174 mg/dL — ABNORMAL HIGH (ref 70–99)

## 2019-09-17 LAB — LIPID PANEL
Cholesterol: 153 mg/dL (ref 0–200)
HDL: 25 mg/dL — ABNORMAL LOW (ref >40)
LDL Cholesterol: 74 mg/dL (ref 0–99)
Total CHOL/HDL Ratio: 6.1 RATIO
Triglycerides: 269 mg/dL — ABNORMAL HIGH (ref <150)
VLDL: 54 mg/dL — ABNORMAL HIGH (ref 0–40)

## 2019-09-17 LAB — SARS CORONAVIRUS 2 (TAT 6-24 HRS): SARS Coronavirus 2: NEGATIVE

## 2019-09-17 MED ORDER — INSULIN DETEMIR 100 UNIT/ML ~~LOC~~ SOLN
15.0000 [IU] | Freq: Every day | SUBCUTANEOUS | Status: DC
  Administered 2019-09-17 – 2019-09-18 (×2): 15 [IU] via SUBCUTANEOUS
  Filled 2019-09-17 (×2): qty 0.15

## 2019-09-17 MED ORDER — INSULIN DETEMIR 100 UNIT/ML ~~LOC~~ SOLN: 16.0000 [IU] | Freq: Every day | SUBCUTANEOUS | Status: DC

## 2019-09-17 MED ORDER — ROSUVASTATIN CALCIUM 20 MG PO TABS
40.0000 mg | ORAL_TABLET | Freq: Every day | ORAL | Status: DC
  Administered 2019-09-17 – 2019-09-20 (×4): 40 mg via ORAL
  Filled 2019-09-17: qty 1
  Filled 2019-09-17 (×3): qty 2
  Filled 2019-09-17: qty 1

## 2019-09-17 NOTE — Evaluation (Signed)
Speech Language Pathology Evaluation Patient Details Name: David Burton MRN: [id number] DOB: 11-12-1945 Today's Date: 09/17/2019 Time: 7673-4193 SLP Time Calculation (min) (ACUTE ONLY): 14 min  Problem List:  Patient Active Problem List   Diagnosis Date Noted  . Right pontine stroke ([place]) 09/16/2019  . Hypertension with heart disease 12/16/2018  . CAD (coronary artery disease), native coronary artery 12/16/2018  . CKD (chronic kidney disease), stage III 12/16/2018  . Pure hypercholesterolemia 12/16/2018  . Type 2 diabetes, controlled, with peripheral neuropathy ([place]) 12/16/2018   Past Medical History:  Past Medical History:  Diagnosis Date  . Bell palsy   . CKD (chronic kidney disease), stage III 12/16/2018  . Coronary artery disease involving native coronary artery of native heart with unstable angina pectoris ([place]) 12/16/2018  . Diabetes mellitus   . Diabetes mellitus without complication ([place])   . GERD (gastroesophageal reflux disease)   . Hypertension   . Hypertension with heart disease 12/16/2018  . Myocardial infarction ([place])   . Pure hypercholesterolemia 12/16/2018  . Stroke ([place])   . Type 2 diabetes, controlled, with peripheral neuropathy ([place]) 12/16/2018   Past Surgical History:  Past Surgical History:  Procedure Laterality Date  . CARDIAC SURGERY    . COLONOSCOPY WITH PROPOFOL N/A 01/02/2018   Procedure: COLONOSCOPY WITH PROPOFOL;  Surgeon: Carol Ada, MD;  Location: [facility];  Service: Endoscopy;  Laterality: N/A;  . CORONARY STENT INTERVENTION N/A 01/26/2019   Procedure: CORONARY STENT INTERVENTION;  Surgeon: Adrian Prows, MD;  Location: [facility];  Service: Cardiovascular;  Laterality: N/A;  . LEFT HEART CATH AND CORS/GRAFTS ANGIOGRAPHY N/A 01/05/2019   Procedure: LEFT HEART CATH AND CORS/GRAFTS ANGIOGRAPHY;  Surgeon: Adrian Prows, MD;  Location: [facility];  Service: Cardiovascular;  Laterality: N/A;  . POLYPECTOMY  01/02/2018   Procedure: POLYPECTOMY;   Surgeon: Carol Ada, MD;  Location: [facility];  Service: Endoscopy;;  . stents     HPI:  Pt is a 74 y.o. male with PMH of type 2 diabetes, stroke, HLD, MI, hypertension, GERD, CKD IIIB who presented with generalized weakness. MRI of the brain: 17 mm acute/early subacute infarct within the right pons. Background mild chronic small vessel ischemic disease within the erebral white matter and pons. Tiny chronic lacunar infarct within the left cerebellum. Pt passed the [facility] on 09/16/19 but is currently NPO since he had a "choking" episode with pils this morning.    Assessment / Plan / Recommendation Clinical Impression  Pt participated in speech/language/cognition evaluation. He initially denied any baseline deficits in speech, language, or cognition but during the assessment stated, "I've always been bad with memory stuff like that". He reported that he lives alone and had been independently managing his medications and finances prior to admission. He reported that his speech is not as clear as at baseline and indicated that he believes it is now approximately 50% back to baseline. The [facility] Mental Status Examination was completed to evaluate the pt's cognitive-linguistic skills. He achieved a score of 15/30 which is below the normal limits of 27 or more out of 30. He exhibited deficits in the areas of awareness, attention, memory, executive function, and complex problem solving. He also presented with mild-moderate dysarthria characterized by reduced articulatory precision which negatively impacted speech intelligibility at the conversational level. Skilled SLP services are clinically indicated at this time to improve motor speech and cognitive-linguistic function.    SLP Assessment  SLP Recommendation/Assessment: Patient needs continued [facility] SLP Visit  Diagnosis: Cognitive communication deficit (R41.841);Dysarthria and anarthria (R47.1)    Follow Up  Recommendations  Skilled Nursing facility;24 hour supervision/assistance    Frequency and Duration min 2x/week  2 weeks      SLP Evaluation Cognition  Overall Cognitive Status: Impaired/Different from baseline Arousal/Alertness: Awake/alert Orientation Level: Oriented to person;Oriented to place;Oriented to situation;Disoriented to time Attention: Focused;Sustained Focused Attention: Impaired Focused Attention Impairment: Verbal complex Sustained Attention: Impaired Sustained Attention Impairment: Verbal complex Memory: Impaired Memory Impairment: Storage deficit;Retrieval deficit(Immediate: 4/5; delayed: 3/5) Awareness: Impaired Awareness Impairment: Emergent impairment Problem Solving: Impaired Problem Solving Impairment: Verbal complex Executive Function: Reasoning;Sequencing Reasoning: Appears intact       Comprehension  Auditory Comprehension Overall Auditory Comprehension: Appears within functional limits for tasks assessed Yes/No Questions: Within Functional Limits Commands: Within Functional Limits Conversation: Complex Reading Comprehension Reading Status: Within funtional limits    Expression Expression Primary Mode of Expression: Verbal Verbal Expression Overall Verbal Expression: Appears within functional limits for tasks assessed Initiation: No impairment Repetition: No impairment Naming: No impairment Pragmatics: No impairment Interfering Components: Attention Written Expression Dominant Hand: Right   Oral / Motor  Oral Motor/Sensory Function Overall Oral Motor/Sensory Function: Generalized oral weakness Facial ROM: Within Functional Limits Facial Symmetry: Within Functional Limits Facial Strength: Within Functional Limits Facial Sensation: Within Functional Limits Lingual ROM: Reduced right;Reduced left;Suspected CN XII (hypoglossal) dysfunction Lingual Symmetry: Abnormal symmetry left;Suspected CN XII (hypoglossal) dysfunction Lingual Strength:  Reduced;Suspected CN XII (hypoglossal) dysfunction Lingual Sensation: Within Functional Limits Velum: Within Functional Limits Mandible: Within Functional Limits Motor Speech Overall Motor Speech: Impaired Respiration: Within functional limits Phonation: Normal Resonance: Within functional limits Articulation: Impaired Level of Impairment: Conversation Intelligibility: Intelligibility reduced Word: 75-100% accurate Phrase: 75-100% accurate Sentence: 75-100% accurate Conversation: 50-74% accurate Motor Planning: Witnin functional limits Motor Speech Errors: Not applicable   Vontrell Pullman I. Hardin Negus, [place], [place] Office number [phone] Pager [place] 09/17/2019, 5:30 PM

## 2019-09-17 NOTE — Progress Notes (Addendum)
Inpatient Diabetes Program Recommendations  AACE/ADA: New Consensus Statement on Inpatient Glycemic Control (2015)  Target Ranges:  Prepandial:   less than 140 mg/dL      Peak postprandial:   less than 180 mg/dL (1-2 hours)      Critically ill patients:  140 - 180 mg/dL   Results for David Burton, David Burton (MRN [id number]) as of 09/17/2019 07:08  Ref. Range 09/16/2019 08:18 09/16/2019 11:46 09/16/2019 13:47 09/16/2019 22:34  Glucose-Capillary Latest Ref Range: 70 - 99 mg/dL 364 (H) 354 (H)  10 units NOVOLOG given at 12:55pm 270 (H) 226 (H)  2 units NOVOLOG +  20 units LEVEMIR   Results for David Burton, David Burton (MRN [id number]) as of 09/17/2019 07:08  Ref. Range 09/17/2019 02:02  Hemoglobin A1C Latest Ref Range: 4.8 - 5.6 % 10.8 (H)  (263 mg/dl)    Admit with: Acute/subacute Pont[place] Stroke   History: DM, CVA,  Home DM Meds: Levemir 20 units QHS       Novolog 8-10 units TID       Metformin 500 mg BID  Current Orders: Levemir 15 units Daily      Novolog Sensitive Correction Scale/ SSI (0-9 units) TID AC + HS    PCP: Dr. York Ram  Per MD notes, pt stopped taking insulin at home this past week thinking the insulin may have been causing his symptoms--reports difficulty with insulin regimen due to frequency.   Note Levemir dose increased to 15 units Daily this AM.  Will try to speak with patient regarding his A1c and home DM care regimen while in hospital.    Addendum 11:40am--Met w/ pt at bedside down in the ED.  Pt verified above listed home DM meds.  Verified that he hadn't taken his insulin for several days prior to coming to the ED.  Also admitted to inconsistency in taking insulin on a regular basis.  Told me he forgets to take insulin sometimes and feels like his meds are a bit overwhelming.  Pt told me that the MD here in the hospital is looking into possibly getting him some help in the home with taking his meds--pt lives alone and is supposed to take 4 injections insulin per day.  Has  CBG meter but told me he ran out of strips.  Needs Rx for CBG meter strips at time of d/c.  Has been taking roughly 8-10 units Novolog with each meal when he remembers to take it.  Does not have any idea what his CBGs have been.  Spoke with patient about his current A1c of 10.8%.  Explained what an A1c is and what it measures.  Reminded patient that his goal A1c is 7% or less per ADA standards to prevent both acute and long-term complications.  Explained to patient the extreme importance of good glucose control at home.  Encouraged patient to check his CBGs at least TID AC at home and to record all CBGs in a logbook for his PCP to review.    Explained to pt that we will be checking his CBGs TID AC + HS in the hospital and will be giving him his Lantus and Novolog.  Pt appreciative of visit and stated he did not have questions for me at this time.     --Will follow patient during hospitalization--  Wyn Quaker RN, MSN, CDE Diabetes Coordinator Inpatient Glycemic Control Team Team Pager: [phone] (8a-5p)

## 2019-09-17 NOTE — Evaluation (Signed)
Clinical/Bedside Swallow Evaluation Patient Details  Name: David Burton MRN: [id number] Date of Birth: 1945/07/27  Today's Date: 09/17/2019 Time: SLP Start Time (ACUTE ONLY): 1454 SLP Stop Time (ACUTE ONLY): 1510 SLP Time Calculation (min) (ACUTE ONLY): 16 min  Past Medical History:  Past Medical History:  Diagnosis Date  . Bell palsy   . CKD (chronic kidney disease), stage III 12/16/2018  . Coronary artery disease involving native coronary artery of native heart with unstable angina pectoris ([place]) 12/16/2018  . Diabetes mellitus   . Diabetes mellitus without complication ([place])   . GERD (gastroesophageal reflux disease)   . Hypertension   . Hypertension with heart disease 12/16/2018  . Myocardial infarction ([place])   . Pure hypercholesterolemia 12/16/2018  . Stroke ([place])   . Type 2 diabetes, controlled, with peripheral neuropathy ([place]) 12/16/2018   Past Surgical History:  Past Surgical History:  Procedure Laterality Date  . CARDIAC SURGERY    . COLONOSCOPY WITH PROPOFOL N/A 01/02/2018   Procedure: COLONOSCOPY WITH PROPOFOL;  Surgeon: Carol Ada, MD;  [facility];  Service: Endoscopy;  Laterality: N/A;  . CORONARY STENT INTERVENTION N/A 01/26/2019   Procedure: CORONARY STENT INTERVENTION;  Surgeon: Adrian Prows, MD;  Location: [facility] [facility];  Service: Cardiovascular;  Laterality: N/A;  . LEFT HEART CATH AND CORS/GRAFTS ANGIOGRAPHY N/A 01/05/2019   Procedure: LEFT HEART CATH AND CORS/GRAFTS ANGIOGRAPHY;  Surgeon: Adrian Prows, MD;  Location: [facility];  Service: Cardiovascular;  Laterality: N/A;  . POLYPECTOMY  01/02/2018   Procedure: POLYPECTOMY;  Surgeon: Carol Ada, MD;  [facility];  Service: Endoscopy;;  . stents     HPI:  Pt is a 74 y.o. male with PMH of type 2 diabetes, stroke, HLD, MI, hypertension, GERD, CKD IIIB who presented with generalized weakness. MRI of the brain: 17 mm acute/early subacute infarct within the right pons. Background mild  chronic small vessel ischemic disease within the erebral white matter and pons. Tiny chronic lacunar infarct within the left cerebellum. Pt passed the [facility] on 09/16/19 but is currently NPO since he had a "choking" episode with pills this morning.    Assessment / Plan / Recommendation Clinical Impression  Pt was seen for bedside swallow evaluation and he denied a history of chronic dysphagia but stated that since admission he has been noticing coughing when he eats/drinks. Oral mechanism exam was significant for facial and lingual weakness with reduced dentition. He tolerated dysphagia 2 solids and thin liquids via cup and straw without overt s/sx of aspiration. However, he consistently exhibited coughing with regular texture solids, mixed consistency boluses, and with ice chips, suggesting possible aspiration. Mastication time was mildly increased but oral residue was functional. A dysphagia 2 diet with thin liquids is recommended at this time with meds whole in puree. A modified barium swallow study is recommended at this time to further assess swallow function.  SLP Visit Diagnosis: Dysphagia, oropharyngeal phase (R13.12)    Aspiration Risk  Mild aspiration risk    Diet Recommendation Dysphagia 3 (Mech soft);Thin liquid   Liquid Administration via: Cup;No straw Medication Administration: Whole meds with puree Supervision: Patient able to self feed Compensations: Small sips/bites;Slow rate;Follow solids with liquid Postural Changes: Seated upright at 90 degrees    Other  Recommendations Oral Care Recommendations: Oral care BID   Follow up Recommendations Skilled Nursing facility      Frequency and Duration min 2x/week  2 weeks       Prognosis Prognosis for Safe Diet Advancement: Good  Swallow Study   General Date of Onset: 09/16/19 HPI: Pt is a 74 y.o. male with PMH of type 2 diabetes, stroke, HLD, MI, hypertension, GERD, CKD IIIB who presented with generalized weakness. MRI of  the brain: 17 mm acute/early subacute infarct within the right pons. Background mild chronic small vessel ischemic disease within the erebral white matter and pons. Tiny chronic lacunar infarct within the left cerebellum. Pt passed the [facility] on 09/16/19 but is currently NPO since he had a "choking" episode with pils this morning.  Type of Study: Bedside Swallow Evaluation Previous Swallow Assessment: None Diet Prior to this Study: NPO Temperature Spikes Noted: No Respiratory Status: Room air History of Recent Intubation: No Behavior/Cognition: Alert;Cooperative;Pleasant mood Oral Cavity Assessment: Within Functional Limits Oral Care Completed by SLP: No Oral Cavity - Dentition: Missing dentition;Poor condition Vision: Functional for self-feeding Self-Feeding Abilities: Able to feed self Patient Positioning: Upright in bed Baseline Vocal Quality: Normal Volitional Cough: Strong Volitional Swallow: Able to elicit    Oral/Motor/Sensory Function Overall Oral Motor/Sensory Function: Generalized oral weakness Facial ROM: Within Functional Limits Facial Symmetry: Within Functional Limits Facial Strength: Within Functional Limits Facial Sensation: Within Functional Limits Lingual ROM: Reduced right;Reduced left;Suspected CN XII (hypoglossal) dysfunction Lingual Symmetry: Abnormal symmetry left;Suspected CN XII (hypoglossal) dysfunction Lingual Strength: Reduced;Suspected CN XII (hypoglossal) dysfunction Lingual Sensation: Within Functional Limits Velum: Within Functional Limits Mandible: Within Functional Limits   Ice Chips Ice chips: Impaired Presentation: Spoon Pharyngeal Phase Impairments: Cough - Immediate;Cough - Delayed   Thin Liquid Thin Liquid: Within functional limits Presentation: Straw;Cup    Nectar Thick Nectar Thick Liquid: Not tested   Honey Thick Honey Thick Liquid: Not tested   Puree Puree: Within functional limits Presentation: Spoon   Solid     Solid:  Impaired Presentation: Self Fed Oral Phase Functional Implications: Impaired mastication Pharyngeal Phase Impairments: Throat Clearing - Immediate(IInconsistently) Other Comments: With regular texture solids and inconsistently with mech. soft     Amye Grego I. Hardin Negus, [place], [place] Office number [phone] Pager [phone]  Horton Marshall 09/17/2019,5:15 PM

## 2019-09-17 NOTE — Progress Notes (Addendum)
STROKE TEAM PROGRESS NOTE   INTERVAL HISTORY Echo tech is at the bedside. He still has some left side weakness; stroke wk up underway.  MRI scan shows a right pontine lacunar infarct.  MRI of the brain shows no significant large vessel stenosis.  Echocardiogram and carotid Dopplers are pending.  Hemoglobin A1c is 10.8.  Lipid profile shows LDL cholesterol 75 mg percent.  Patient does admit to snoring and daytime tiredness and has not been tested for sleep apnea yet.  Vitals:   09/17/19 0245 09/17/19 0845 09/17/19 1200 09/17/19 1343  BP: (!) 112/47 115/81 125/60 (!) 126/58  Pulse: 72 80  68  Resp: 20 20 14 18   Temp:   98.1 F (36.7 C) 98 F (36.7 C)  TempSrc:    Oral  SpO2: 93% 97% 99%   Weight:    111.3 kg  Height:    5\' 6"  (1.676 m)    CBC:  Recent Labs  Lab 09/16/19 0835  WBC 9.6  HGB 15.2  HCT 45.2  MCV 89.0  PLT 588    Basic Metabolic Panel:  Recent Labs  Lab 09/16/19 0835 09/17/19 0202  NA 136 139  K 4.8 4.9  CL 99 105  CO2 26 22  GLUCOSE 390* 207*  BUN 51* 58*  CREATININE 2.63* 2.19*  CALCIUM 9.6 9.3   Lipid Panel:     Component Value Date/Time   CHOL 153 09/17/2019 0202   TRIG 269 (H) 09/17/2019 0202   HDL 25 (L) 09/17/2019 0202   CHOLHDL 6.1 09/17/2019 0202   VLDL 54 (H) 09/17/2019 0202   LDLCALC 74 09/17/2019 0202   HgbA1c:  Lab Results  Component Value Date   HGBA1C 10.8 (H) 09/17/2019   Urine Drug Screen:     Component Value Date/Time   LABOPIA NONE DETECTED 01/02/2015 1750   COCAINSCRNUR NONE DETECTED 01/02/2015 1750   LABBENZ NONE DETECTED 01/02/2015 1750   AMPHETMU NONE DETECTED 01/02/2015 1750   THCU NONE DETECTED 01/02/2015 1750   LABBARB NONE DETECTED 01/02/2015 1750    Alcohol Level     Component Value Date/Time   ETH 106 (H) 01/02/2015 1801    IMAGING past 24 hours MR ANGIO HEAD WO CONTRAST  Result Date: 09/16/2019 CLINICAL DATA:  Ataxia EXAM: MRA HEAD WITHOUT CONTRAST TECHNIQUE: Angiographic images of the [place] were obtained using MRA technique without intravenous contrast. COMPARISON:  Brain MRI same day FINDINGS: POSTERIOR CIRCULATION: --Vertebral arteries: Normal V4 segments. --Posterior inferior cerebellar arteries (PICA): Patent origins from the vertebral arteries. --Anterior inferior cerebellar arteries (AICA): Patent origins from the basilar artery. --Basilar artery: Normal. --Superior cerebellar arteries: Normal. --Posterior cerebral arteries: Normal. The left PCA is predominantly supplied by the posterior communicating artery. ANTERIOR CIRCULATION: --Intracranial internal carotid arteries: Normal. --Anterior cerebral arteries (ACA): Normal. Both A1 segments are present. Patent anterior communicating artery (a-comm). --Middle cerebral arteries (MCA): Normal. IMPRESSION: Normal brain MRA. Electronically Signed   By: Ulyses Jarred M.D.   On: 09/16/2019 22:55   MR BRAIN WO CONTRAST  Result Date: 09/16/2019 CLINICAL DATA:  Neuro deficit, acute, stroke suspected. Additional history provided: Difficulties with balance and walking for the past week EXAM: MRI HEAD WITHOUT CONTRAST TECHNIQUE: Multiplanar, multiecho pulse sequences of the brain and surrounding structures were obtained without intravenous contrast. COMPARISON:  Non-contrast head CT performed earlier the same day 09/16/2019 FINDINGS: Brain: Mild intermittent motion degradation. There is a 17 x 17 mm focus of restricted diffusion within the right pons consistent with acute/early subacute  infarct. Corresponding T2/FLAIR hyperintensity at this site. There is no evidence of intracranial mass. No midline shift or extra-axial fluid collection. No chronic intracranial blood products. Mild scattered T2/FLAIR hyperintensity within the cerebral white matter and pons is nonspecific, but consistent with chronic small vessel ischemic disease. Small chronic lacunar infarct within the left cerebellar hemisphere. Mild generalized parenchymal atrophy. Vascular: Flow  voids maintained within the proximal large arterial vessels. Skull and upper cervical spine: No focal marrow lesion. Sinuses/Orbits: Visualized orbits demonstrate no acute abnormality. Mild paranasal sinus mucosal thickening. Trace fluid within left mastoid air cells. IMPRESSION: 1. Mildly motion degraded exam. 2. Confirmed 17 mm acute/early subacute infarct within the right pons. 3. Background mild chronic small vessel ischemic disease within the cerebral white matter and pons. Tiny chronic lacunar infarct within the left cerebellum. 4. Mild generalized parenchymal atrophy. 5. Mild paranasal sinus mucosal thickening. Trace left mastoid effusion. Electronically Signed   By: Kellie Simmering DO   On: 09/16/2019 18:14   VAS US CAROTID  Result Date: 09/17/2019 Carotid Arterial Duplex Study Indications:       CVA. Risk Factors:      Hypertension, Diabetes, coronary artery disease, prior CVA. Limitations        Today's exam was limited due to the body habitus of the                    patient. Comparison Study:  no prior Performing Technologist: Abram Sander RVS  Examination Guidelines: A complete evaluation includes B-mode imaging, spectral Doppler, color Doppler, and power Doppler as needed of all accessible portions of each vessel. Bilateral testing is considered an integral part of a complete examination. Limited examinations for reoccurring indications may be performed as noted.  Right Carotid Findings: +----------+--------+--------+--------+------------------+--------+           PSV cm/sEDV cm/sStenosisPlaque DescriptionComments +----------+--------+--------+--------+------------------+--------+ CCA Prox  83      11              heterogenous               +----------+--------+--------+--------+------------------+--------+ CCA Distal80      9               heterogenous               +----------+--------+--------+--------+------------------+--------+ ICA Prox  76      22      1-39%    heterogenous               +----------+--------+--------+--------+------------------+--------+ ICA Distal49      13                                         +----------+--------+--------+--------+------------------+--------+ ECA       62                                                 +----------+--------+--------+--------+------------------+--------+ +----------+--------+-------+--------+-------------------+           PSV cm/sEDV cmsDescribeArm Pressure (mmHG) +----------+--------+-------+--------+-------------------+ [id number]                                         +----------+--------+-------+--------+-------------------+ +---------+--------+--------+------------+ VertebralPSV cm/sEDV cm/sNot assessed +---------+--------+--------+------------+  Left Carotid Findings: +----------+--------+--------+--------+------------------+--------+           PSV cm/sEDV cm/sStenosisPlaque DescriptionComments +----------+--------+--------+--------+------------------+--------+ CCA Prox  140     17              heterogenous               +----------+--------+--------+--------+------------------+--------+ CCA Distal62      13              heterogenous               +----------+--------+--------+--------+------------------+--------+ ICA Prox  65      20      1-39%   heterogenous               +----------+--------+--------+--------+------------------+--------+ ICA Distal54      18                                         +----------+--------+--------+--------+------------------+--------+ ECA       64      5                                          +----------+--------+--------+--------+------------------+--------+ +----------+--------+--------+--------+-------------------+           PSV cm/sEDV cm/sDescribeArm Pressure (mmHG) +----------+--------+--------+--------+-------------------+ [id number]                                           +----------+--------+--------+--------+-------------------+ +---------+--------+--+--------+-+---------+ VertebralPSV cm/s35EDV cm/s8Antegrade +---------+--------+--+--------+-+---------+   Summary: Right Carotid: Velocities in the right ICA are consistent with a 1-39% stenosis. Left Carotid: Velocities in the left ICA are consistent with a 1-39% stenosis. Vertebrals: Left vertebral artery demonstrates antegrade flow. Right vertebral             artery was not visualized. *See table(s) above for measurements and observations.  Electronically signed by Antony Contras MD on 09/17/2019 at 1:46:35 PM.    Final     PHYSICAL EXAM Constitutional: Obese elderly male appears well-developed and well-nourished. Obese Psych: Affect appropriate to situation Eyes: No scleral injection HENT: No OP obstrucion MSK: no joint deformities.  Cardiovascular: Normal rate and regular rhythm.  Respiratory: Effort normal, non-labored breathing GI: Soft.  No distension. There is no tenderness.  Skin: WDI  Neuro: Mental Status: Patient is awake, alert, oriented to person, place, month, year, and situation. Patient is able to give a clear and coherent history. No signs of aphasia or neglect Cranial Nerves: II: Visual Fields are full. Pupils are equal, round, and reactive to light.   III,IV, VI: EOMI without ptosis or diploplia.  V: Facial sensation is symmetric to temperature VII: Facial movement is diminished on the right (old Bell's palsy) VIII: hearing is intact to voice X: Uvula elevates symmetrically XI: Shoulder shrug is symmetric. XII: tongue is midline without atrophy or fasciculations.  Motor: Tone is normal. Bulk is normal. 5/5 strength was present on the right, he has 4-/5 weakness left arm and leg no drift.  Weakness of left grip and intrinsic hand muscles.  Orbits right over left upper extremity. Sensory: Sensation is symmetric to light touch and temperature in the arms and legs. Cerebellar: He  has dysmetria out of proportion to weakness in the left arm.  NIH stroke scale 2 for old facial weakness.  Premorbid modified Rankin scale 1.   ASSESSMENT/PLAN [name] is a 74 y.o. male presenting with intermittent and worsening left side weakness x1 week.   Stroke: Right pontine lacunar infarct secondary to small vessel disease   MRI  Right Pontine infarct   MRA  normal  Carotid Doppler no significant stenosis  2D Echo completed at bedside, but report pending  LDL 74  HgbA1c 10.8  Lovenox for VTE prophylaxis    Diet   DIET DYS 2 Room service appropriate? Yes with Assist; Fluid consistency: Thin     ASA prior to admission, now on ASA + Plavix  Therapy recommendations:  pending  Disposition:  pending  Hypertension  Home meds:  Nitro, zocor, norvasc, apresoline, imdur  Stable . Permissive hypertension (OK if < 220/120) but gradually normalize in 5-7 days . Long-term BP goal normotensive  Hyperlipidemia  Home meds: none   LDL 74, goal < 70  Now on Crestor 40mg   Continue statin at discharge  Diabetes type II Uncontrolled w/hyperglycemia  Home meds: Novolog, levemir, glucophage  HgbA1c 10.8, goal < 7.0  CBGs Recent Labs    09/16/19 2234 09/17/19 0733 09/17/19 1226  GLUCAP 226* 214* 174*      SSI  Other Stroke Risk Factors  Advanced age  Previous Cigarette smoker  Obesity, Body mass index is 39.6 kg/m., recommend weight loss, diet and exercise as appropriate   Hx stroke/TIA  Family hx stroke  Obstructive sleep apnea, needs further wk up for this  Other Active Problems  CKD3- monitor labs  Bells palsy on right side of face  CAD w/MI in past w/stents (prev on Plavix for this, but stopped in last yr)  Hospital day # 0  Desiree Metzger-Cihelka, ARNP-C, ANVP-BC Pager: [phone]  I have personally obtained history,examined this patient, reviewed notes, independently viewed imaging studies, participated in medical decision  making and plan of care.ROS completed by me personally and pertinent positives fully documented  I have made any additions or clarifications directly to the above note. Agree with note above.  Patient presented with right pontine lacunar infarct from small vessel disease.  Recommend aspirin and Plavix for 3 weeks followed by Plavix alone.  Aggressive risk factor modification.  Patient may also consider possible participation with sleep smart study if interested.  He was given information to review and decide.  Greater than 50% time during this 35-minute) spent on counseling and coordination of care about his stroke and suspected sleep apnea and answering questions.  Antony Contras, MD Medical Director [facility] Pager: [phone] 09/17/2019 6:06 PM  To contact Stroke Continuity provider, please refer to [email or web address] After hours, contact General Neurology

## 2019-09-17 NOTE — ED Notes (Signed)
Pt c/o back pain and difficulty with positioning.  Moved to hospital bed with relief of sx.  No other needs noted at this time.

## 2019-09-17 NOTE — Progress Notes (Signed)
  Echocardiogram Echocardiogram Transesophageal has been performed.  David Burton 09/17/2019, 10:28 AM

## 2019-09-17 NOTE — Progress Notes (Signed)
This encounter was created in error - please disregard.

## 2019-09-17 NOTE — Progress Notes (Signed)
Carotid duplex has been completed.   Preliminary results in CV Proc.   Abram Sander 09/17/2019 11:26 AM

## 2019-09-17 NOTE — ED Notes (Signed)
Sent a urine culture with the urine specimen 

## 2019-09-17 NOTE — Evaluation (Signed)
Physical Therapy Evaluation Patient Details Name: David Burton MRN: [id number] DOB: 1946-05-27 Today's Date: 09/17/2019   History of Present Illness  Pt is a 74 y/o male admitted secondary to L sided weakness. Imaging revealed R pons infarct. Pt with chronic R sided facial droop secondary to bells palsy. PMH includes DM, CVA, HTN, CKD, MI, and CAD.   Clinical Impression  Pt admitted secondary to problem above with deficits below. Pt presenting with L sided weakness and difficulty ambulating. Pt requiring min to mod A with HHA to take steps at EOB this session, as LLE tended to buckle. Pt currently lives alone and is concerned about returning home alone. Feel pt would benefit from SNF level therapies at d/c to increase independence and safety with functional mobility.     Follow Up Recommendations SNF    Equipment Recommendations  Rolling walker with 5" wheels    Recommendations for Other Services       Precautions / Restrictions Precautions Precautions: Fall Restrictions Weight Bearing Restrictions: No      Mobility  Bed Mobility Overal bed mobility: Needs Assistance Bed Mobility: Supine to Sit;Sit to Supine     Supine to sit: Mod assist Sit to supine: Min assist   General bed mobility comments: Mod A For trunk elevation to come to sitting. Required min A for LE assist for return to supine.   Transfers Overall transfer level: Needs assistance Equipment used: 1 person hand held assist Transfers: Sit to/from Stand Sit to Stand: Min assist         General transfer comment: Min A for steadying assist.   Ambulation/Gait Ambulation/Gait assistance: Min assist;Mod assist   Assistive device: 1 person hand held assist       General Gait Details: Pt taking side steps at EOB in ED. Pt with L knee buckling, requiring mod A to maintain balance. Otherwise required min A For steadying.   Stairs            Wheelchair Mobility    Modified Rankin (Stroke Patients  Only) Modified Rankin (Stroke Patients Only) Pre-Morbid Rankin Score: No significant disability Modified Rankin: Moderately severe disability     Balance Overall balance assessment: Needs assistance Sitting-balance support: No upper extremity supported;Feet supported Sitting balance-Leahy Scale: Good     Standing balance support: Single extremity supported;During functional activity Standing balance-Leahy Scale: Poor Standing balance comment: Reliant on at least 1 UE support and external support                             Pertinent Vitals/Pain Pain Assessment: 0-10 Pain Score: 8  Pain Location: back Pain Descriptors / Indicators: Aching Pain Intervention(s): Limited activity within patient's tolerance;Monitored during session;Repositioned    Home Living Family/patient expects to be discharged to:: Private residence Living Arrangements: Alone Available Help at Discharge: Family;Available PRN/intermittently Type of Home: Apartment Home Access: Elevator     Home Layout: One level Home Equipment: None Additional Comments: Pt has call bell on the wall that contacts 911    Prior Function Level of Independence: Independent               Hand Dominance        Extremity/Trunk Assessment   Upper Extremity Assessment Upper Extremity Assessment: Defer to OT evaluation    Lower Extremity Assessment Lower Extremity Assessment: LLE deficits/detail LLE Deficits / Details: Grossly 4/5 in ankle and knee extensor muscles. 3/5 in hip flexors.     Cervical /  Trunk Assessment Cervical / Trunk Assessment: Normal  Communication   Communication: No difficulties  Cognition Arousal/Alertness: Awake/alert Behavior During Therapy: WFL for tasks assessed/performed Overall Cognitive Status: No family/caregiver present to determine baseline cognitive functioning                                        General Comments      Exercises      Assessment/Plan    PT Assessment Patient needs continued PT services  PT Problem List Decreased strength;Decreased balance;Decreased mobility;Decreased knowledge of precautions;Decreased knowledge of use of DME       PT Treatment Interventions Gait training;DME instruction;Stair training;Functional mobility training;Therapeutic activities;Therapeutic exercise;Balance training;Patient/family education;Neuromuscular re-education    PT Goals (Current goals can be found in the Care Plan section)  Acute Rehab PT Goals Patient Stated Goal: to be more independent before going home  PT Goal Formulation: With patient Time For Goal Achievement: 10/01/19 Potential to Achieve Goals: Good    Frequency Min 3X/week   Barriers to discharge Decreased caregiver support      Co-evaluation               AM-PAC PT "6 Clicks" Mobility  Outcome Measure Help needed turning from your back to your side while in a flat bed without using bedrails?: A Little Help needed moving from lying on your back to sitting on the side of a flat bed without using bedrails?: A Lot Help needed moving to and from a bed to a chair (including a wheelchair)?: A Lot Help needed standing up from a chair using your arms (e.g., wheelchair or bedside chair)?: A Little Help needed to walk in hospital room?: A Lot Help needed climbing 3-5 steps with a railing? : A Lot 6 Click Score: 14    End of Session Equipment Utilized During Treatment: Gait belt Activity Tolerance: Patient tolerated treatment well Patient left: in bed;with call bell/phone within reach Nurse Communication: Mobility status PT Visit Diagnosis: Unsteadiness on feet (R26.81);Muscle weakness (generalized) (M62.81)    Time: 3734-2876 PT Time Calculation (min) (ACUTE ONLY): 18 min   Charges:   PT Evaluation $PT Eval Moderate Complexity: 1 Mod          Reuel Derby, PT, DPT  Acute Rehabilitation Services  Pager: [phone] Office: [phone]   Rudean Hitt 09/17/2019, 2:56 PM

## 2019-09-17 NOTE — Progress Notes (Signed)
  Speech Language Pathology Treatment: Cognitive-Linquistic(Dysarthria)  Patient Details Name: David Burton MRN: [id number] DOB: [birth year] Today's Date: 09/17/2019 Time: 4287-6811 SLP Time Calculation (min) (ACUTE ONLY): 14 min  Assessment / Plan / Recommendation Clinical Impression  Pt was seen for dysarthria treatment and was cooperative during the session. He was educated regarding the nature of dysarthria, the severity of his deficits, and compensatory strategies to improve speech intelligibility. Pt verbalized understanding regarding all areas of education; however, reinforcement was needed throughout the session due to memory deficits. He used compensatory strategies at the phrase level with 25% accuracy increasing to 75% accuracy with verbal cues and models for overarticulation. SLP will continue to follow pt.    HPI HPI: Pt is a 74 y.o. male with PMH of type 2 diabetes, stroke, HLD, MI, hypertension, GERD, CKD IIIB who presented with generalized weakness. MRI of the brain: 17 mm acute/early subacute infarct within the right pons. Background mild chronic small vessel ischemic disease within the erebral white matter and pons. Tiny chronic lacunar infarct within the left cerebellum. Pt passed the [facility] on 09/16/19 but was then made NPO since he had a "choking" episode with pills on 09/17/19.       SLP Plan  Continue with current plan of care  Patient needs continued Speech Lanaguage Pathology Services    Recommendations  Diet recommendations: Dysphagia 2 (fine chop);Thin liquid Liquids provided via: Cup;No straw Medication Administration: Whole meds with puree Supervision: Patient able to self feed;Intermittent supervision to cue for compensatory strategies Compensations: Small sips/bites;Slow rate;Follow solids with liquid                Oral Care Recommendations: Oral care BID Follow up Recommendations: Skilled Nursing facility;24 hour supervision/assistance SLP Visit Diagnosis:  Cognitive communication deficit (R41.841);Dysarthria and anarthria (R47.1) Plan: Continue with current plan of care       David Burton Negus, [place], [place] Office number [phone] Pager [place] 09/17/2019, 5:35 PM

## 2019-09-18 ENCOUNTER — Inpatient Hospital Stay (HOSPITAL_COMMUNITY): Payer: Medicare HMO

## 2019-09-18 DIAGNOSIS — E1142 Type 2 diabetes mellitus with diabetic polyneuropathy: Secondary | ICD-10-CM | POA: Diagnosis present

## 2019-09-18 DIAGNOSIS — M25422 Effusion, left elbow: Secondary | ICD-10-CM | POA: Diagnosis not present

## 2019-09-18 DIAGNOSIS — E1159 Type 2 diabetes mellitus with other circulatory complications: Secondary | ICD-10-CM

## 2019-09-18 DIAGNOSIS — Z6841 Body Mass Index (BMI) 40.0 and over, adult: Secondary | ICD-10-CM | POA: Diagnosis not present

## 2019-09-18 DIAGNOSIS — R531 Weakness: Secondary | ICD-10-CM | POA: Diagnosis not present

## 2019-09-18 DIAGNOSIS — I5022 Chronic systolic (congestive) heart failure: Secondary | ICD-10-CM | POA: Diagnosis present

## 2019-09-18 DIAGNOSIS — Z66 Do not resuscitate: Secondary | ICD-10-CM | POA: Diagnosis present

## 2019-09-18 DIAGNOSIS — I2510 Atherosclerotic heart disease of native coronary artery without angina pectoris: Secondary | ICD-10-CM | POA: Diagnosis present

## 2019-09-18 DIAGNOSIS — E7800 Pure hypercholesterolemia, unspecified: Secondary | ICD-10-CM | POA: Diagnosis not present

## 2019-09-18 DIAGNOSIS — Z20822 Contact with and (suspected) exposure to covid-19: Secondary | ICD-10-CM | POA: Diagnosis present

## 2019-09-18 DIAGNOSIS — M7022 Olecranon bursitis, left elbow: Secondary | ICD-10-CM | POA: Diagnosis present

## 2019-09-18 DIAGNOSIS — E1122 Type 2 diabetes mellitus with diabetic chronic kidney disease: Secondary | ICD-10-CM | POA: Diagnosis present

## 2019-09-18 DIAGNOSIS — I252 Old myocardial infarction: Secondary | ICD-10-CM | POA: Diagnosis not present

## 2019-09-18 DIAGNOSIS — E781 Pure hyperglyceridemia: Secondary | ICD-10-CM | POA: Diagnosis present

## 2019-09-18 DIAGNOSIS — N1832 Chronic kidney disease, stage 3b: Secondary | ICD-10-CM | POA: Diagnosis present

## 2019-09-18 DIAGNOSIS — F1290 Cannabis use, unspecified, uncomplicated: Secondary | ICD-10-CM | POA: Diagnosis present

## 2019-09-18 DIAGNOSIS — R29702 NIHSS score 2: Secondary | ICD-10-CM | POA: Diagnosis present

## 2019-09-18 DIAGNOSIS — E669 Obesity, unspecified: Secondary | ICD-10-CM | POA: Diagnosis present

## 2019-09-18 DIAGNOSIS — Z87891 Personal history of nicotine dependence: Secondary | ICD-10-CM | POA: Diagnosis not present

## 2019-09-18 DIAGNOSIS — Z794 Long term (current) use of insulin: Secondary | ICD-10-CM | POA: Diagnosis not present

## 2019-09-18 DIAGNOSIS — N179 Acute kidney failure, unspecified: Secondary | ICD-10-CM | POA: Diagnosis present

## 2019-09-18 DIAGNOSIS — I10 Essential (primary) hypertension: Secondary | ICD-10-CM

## 2019-09-18 DIAGNOSIS — R260 Ataxic gait: Secondary | ICD-10-CM | POA: Diagnosis present

## 2019-09-18 DIAGNOSIS — E1165 Type 2 diabetes mellitus with hyperglycemia: Secondary | ICD-10-CM | POA: Diagnosis present

## 2019-09-18 DIAGNOSIS — F1410 Cocaine abuse, uncomplicated: Secondary | ICD-10-CM | POA: Diagnosis not present

## 2019-09-18 DIAGNOSIS — I6381 Other cerebral infarction due to occlusion or stenosis of small artery: Secondary | ICD-10-CM | POA: Diagnosis present

## 2019-09-18 DIAGNOSIS — G8194 Hemiplegia, unspecified affecting left nondominant side: Secondary | ICD-10-CM | POA: Diagnosis present

## 2019-09-18 DIAGNOSIS — Z955 Presence of coronary angioplasty implant and graft: Secondary | ICD-10-CM | POA: Diagnosis not present

## 2019-09-18 DIAGNOSIS — F1210 Cannabis abuse, uncomplicated: Secondary | ICD-10-CM

## 2019-09-18 DIAGNOSIS — I6350 Cerebral infarction due to unspecified occlusion or stenosis of unspecified cerebral artery: Secondary | ICD-10-CM | POA: Diagnosis not present

## 2019-09-18 DIAGNOSIS — R42 Dizziness and giddiness: Secondary | ICD-10-CM | POA: Diagnosis present

## 2019-09-18 DIAGNOSIS — I130 Hypertensive heart and chronic kidney disease with heart failure and stage 1 through stage 4 chronic kidney disease, or unspecified chronic kidney disease: Secondary | ICD-10-CM | POA: Diagnosis present

## 2019-09-18 DIAGNOSIS — G510 Bell's palsy: Secondary | ICD-10-CM | POA: Diagnosis present

## 2019-09-18 LAB — BASIC METABOLIC PANEL WITH GFR
Anion gap: 8 (ref 5–15)
BUN: 37 mg/dL — ABNORMAL HIGH (ref 8–23)
CO2: 20 mmol/L — ABNORMAL LOW (ref 22–32)
Calcium: 9 mg/dL (ref 8.9–10.3)
Chloride: 109 mmol/L (ref 98–111)
Creatinine, Ser: 1.74 mg/dL — ABNORMAL HIGH (ref 0.61–1.24)
GFR calc Af Amer: 44 mL/min — ABNORMAL LOW
GFR calc non Af Amer: 38 mL/min — ABNORMAL LOW
Glucose, Bld: 225 mg/dL — ABNORMAL HIGH (ref 70–99)
Potassium: 4.6 mmol/L (ref 3.5–5.1)
Sodium: 137 mmol/L (ref 135–145)

## 2019-09-18 LAB — RAPID URINE DRUG SCREEN, HOSP PERFORMED
Amphetamines: NOT DETECTED
Barbiturates: NOT DETECTED
Benzodiazepines: NOT DETECTED
Cocaine: POSITIVE — AB
Opiates: NOT DETECTED
Tetrahydrocannabinol: NOT DETECTED

## 2019-09-18 LAB — GLUCOSE, CAPILLARY
Glucose-Capillary: 192 mg/dL — ABNORMAL HIGH (ref 70–99)
Glucose-Capillary: 175 mg/dL — ABNORMAL HIGH (ref 70–99)
Glucose-Capillary: 233 mg/dL — ABNORMAL HIGH (ref 70–99)
Glucose-Capillary: 286 mg/dL — ABNORMAL HIGH (ref 70–99)

## 2019-09-18 MED ORDER — INSULIN DETEMIR 100 UNIT/ML ~~LOC~~ SOLN
20.0000 [IU] | Freq: Every day | SUBCUTANEOUS | Status: DC
  Administered 2019-09-19 – 2019-09-21 (×3): 20 [IU] via SUBCUTANEOUS
  Filled 2019-09-18 (×3): qty 0.2

## 2019-09-18 MED ORDER — INSULIN DETEMIR 100 UNIT/ML ~~LOC~~ SOLN
5.0000 [IU] | Freq: Once | SUBCUTANEOUS | Status: AC
  Administered 2019-09-18: 5 [IU] via SUBCUTANEOUS
  Filled 2019-09-18: qty 0.05

## 2019-09-18 NOTE — Progress Notes (Signed)
Modified Barium Swallow Progress Note  Patient Details  Name: David Burton MRN: [id number] Date of Birth: 1945-07-07  Today's Date: 09/18/2019  Modified Barium Swallow completed.  Full report located under Chart Review in the Imaging Section.  Brief recommendations include the following:  Clinical Impression  Pt presents with a mild oropharyngeal dysphagia with suspected esophageal component (note that imaging was not available for review at the time of documentation). He has mildly prolonged oral preparation that may be related to lack of dentition, but he also has mild lingual residue that required Min cues for a second dry swallow. Pharyngeally he had good airway protection despite thin liquids at times reaching the pyriform sinuses prior to the swallow. He has suspected osteophytes around the level of the UES that may reduce UES relaxation as there was mild thin liquid residue in the pyriform sinuses in particular. Suspect that overall this residue of thin liquids > solids may be related to esophageal component as he also had incomplete clearance of the barium tablet despite multiple liquid washes and dry swallows (MD not present for confirmation). Recommend continuing Dys 2 diet and thin liquids. Could consider esophageal w/u as well.    Swallow Evaluation Recommendations   Recommended Consults: Consider esophageal assessment   SLP Diet Recommendations: Dysphagia 2 (Fine chop) solids;Thin liquid   Liquid Administration via: Cup;Straw   Medication Administration: Whole meds with liquid(crush larger pills)   Supervision: Patient able to self feed;Intermittent supervision to cue for compensatory strategies   Compensations: Slow rate;Small sips/bites;Follow solids with liquid   Postural Changes: Remain semi-upright after after feeds/meals (Comment);Seated upright at 90 degrees   Oral Care Recommendations: Oral care BID        Osie Bond., M.A. [place] Pager [phone] Office [phone]  09/18/2019,3:27 PM

## 2019-09-18 NOTE — Progress Notes (Signed)
Family Medicine Teaching Service Daily Progress Note Intern Pager: [phone]  Patient name: David Burton Medical record number: [id number] Date of birth: 1945-09-24 Age: 74 y.o. Gender: male  Primary Care Provider: Harvie Junior, MD Consultants: Neurology Code Status: Full code  Pt Overview and Major Events to Date:  4/15: Admitted, acute pontine stroke  Assessment and Plan: David Burton is a 74 y.o. male presenting with generalized weakness with imaging findings concerning for acute CVA. PMH is significant for type 2 diabetes,  HLD, MI, hypertension, GERD, CKD IIIB.   Acute/subacute Pontine Stroke  Patient reports feeling "Ok" this morning, denies pain or changes in weakness. SLP recommends SNF following discharge. Patient passed stroke swallow evaluation and recommended for dysphagia level 2 diet.  Normal carotid ultrasound.  -Neurology following, recs as stated below  - Follow-up repeat echocardiogram, read pending  - Plavix 75 mg and aspirin 81 mg(continue dual antiplatelet therapy for 3 weeks, followed by Plavix alone) - continue rosuvastatin 40 - Vitals per stroke protocol - vitals per floor protocol   Dizziness  Balance difficulties:  Patient reports feeling dizzy with positional change from lying to sitting up in bed. States he has not tried to walk during this admission.  -PT recommends SNF, patient agreeable   T2DM Blood glucose range from  overnight.  Patient received 15 units of lantus yesterday.  Home regimen includes Lantus 20 units.  Patient received 12 units of fast acting insulin overnight.  -Sensitive sliding scale - Increase lantus to home dose 20 - CBG with meals and at bed time  - qHS insulin coverage  - recommend starting trulicity on discharge  CAD, 2 stents placed 01/26/2019  history of infarction  HFmrEF 40 - 45% Last echocardiogram in 2020 following patient's stent placement with ejection fraction of 40-45%, mild mitral regurgitation and pulmonary  artery pressure within normal limits at 21 mmHg.  Patient is not reporting chest pain at this time.  Last weight 111.3 kg from 113.4 kg on admission. -Follow-up read for echocardiogram  Acute on Chronic Kidney disease  Creatinine on admission elevated at 2.63.  Baseline appears to be between 1.41.5.  Creatinine improved this morning to 1.74 from 2.19.  GFR 44.  -Continue to monitor creatinine with bMP   HTN, stable BP range 113-144/53-63. Most recent BP 130/60.  -Monitor blood pressure with vitals  HLD  Lipid panel with elevated triglycerides 269, LDL 74, HDL low 25 and total cholesterol 153. - continue rosuvastatin 40mg    FEN/GI:  dysphagia 2 diet  PPx: Lovenox  Disposition: Discharge pending completion of stroke evaluation, likely late afternoon 4/16 or 4/17 pending completion of stroke evaluation   Subjective:  Patient states that he continues to have some dizziness, denies chest pain, denies shortness of breath, denies headache or blurry vision. Still has some weakness but feels better from a muscle soreness standpoint. Patient is now denying SI.   Objective: Temp:  [98 F (36.7 C)-98.5 F (36.9 C)] 98.3 F (36.8 C) (04/17 0750) Pulse Rate:  [61-73] 61 (04/17 0750) Resp:  [14-18] 15 (04/17 0750) BP: (113-144)/(53-63) 130/60 (04/17 0750) SpO2:  [95 %-100 %] 96 % (04/17 0750) Weight:  [111.3 kg] 111.3 kg (04/16 1343)  Physical Exam: General: male appearing stated age lying in bed in NAD  Cardiovascular: Regular rate and rhythm, without murmurs  Respiratory: Clear to auscultation without wheezing, no increased work of breathing, stable on room air Abdomen: Abdomen is soft, nontender with bowel sounds present throughout Extremities: No lower extremity edema  Neuro: Neurological exam remains unchanged with 4/5 strength in both lower extremities, sensation intact, facial changes residual from Bell's palsy  Laboratory: Recent Labs  Lab 09/16/19 0835  WBC 9.6  HGB 15.2   HCT 45.2  PLT 165   Recent Labs  Lab 09/16/19 0835 09/17/19 0202 09/18/19 0232  NA 136 139 137  K 4.8 4.9 4.6  CL 99 105 109  CO2 26 22 20*  BUN 51* 58* 37*  CREATININE 2.63* 2.19* 1.74*  CALCIUM 9.6 9.3 9.0  PROT 7.6  --   --   BILITOT 1.0  --   --   ALKPHOS 87  --   --   ALT 22  --   --   AST 17  --   --   GLUCOSE 390* 207* 225*    Imaging/Diagnostic Tests: VAS US CAROTID  Result Date: 09/17/2019 Carotid Arterial Duplex Study Indications:       CVA. Risk Factors:      Hypertension, Diabetes, coronary artery disease, prior CVA. Limitations        Today's exam was limited due to the body habitus of the                    patient. Comparison Study:  no prior Performing Technologist: Abram Sander RVS  Examination Guidelines: A complete evaluation includes B-mode imaging, spectral Doppler, color Doppler, and power Doppler as needed of all accessible portions of each vessel. Bilateral testing is considered an integral part of a complete examination. Limited examinations for reoccurring indications may be performed as noted.  Right Carotid Findings: +----------+--------+--------+--------+------------------+--------+           PSV cm/sEDV cm/sStenosisPlaque DescriptionComments +----------+--------+--------+--------+------------------+--------+ CCA Prox  83      11              heterogenous               +----------+--------+--------+--------+------------------+--------+ CCA Distal80      9               heterogenous               +----------+--------+--------+--------+------------------+--------+ ICA Prox  76      22      1-39%   heterogenous               +----------+--------+--------+--------+------------------+--------+ ICA Distal49      13                                         +----------+--------+--------+--------+------------------+--------+ ECA       62                                                  +----------+--------+--------+--------+------------------+--------+ +----------+--------+-------+--------+-------------------+           PSV cm/sEDV cmsDescribeArm Pressure (mmHG) +----------+--------+-------+--------+-------------------+ [id number]                                         +----------+--------+-------+--------+-------------------+ +---------+--------+--------+------------+ VertebralPSV cm/sEDV cm/sNot assessed +---------+--------+--------+------------+  Left Carotid Findings: +----------+--------+--------+--------+------------------+--------+           PSV cm/sEDV cm/sStenosisPlaque DescriptionComments +----------+--------+--------+--------+------------------+--------+ CCA Prox  140  17              heterogenous               +----------+--------+--------+--------+------------------+--------+ CCA Distal62      13              heterogenous               +----------+--------+--------+--------+------------------+--------+ ICA Prox  65      20      1-39%   heterogenous               +----------+--------+--------+--------+------------------+--------+ ICA Distal54      18                                         +----------+--------+--------+--------+------------------+--------+ ECA       64      5                                          +----------+--------+--------+--------+------------------+--------+ +----------+--------+--------+--------+-------------------+           PSV cm/sEDV cm/sDescribeArm Pressure (mmHG) +----------+--------+--------+--------+-------------------+ [id number]                                          +----------+--------+--------+--------+-------------------+ +---------+--------+--+--------+-+---------+ VertebralPSV cm/s35EDV cm/s8Antegrade +---------+--------+--+--------+-+---------+   Summary: Right Carotid: Velocities in the right ICA are consistent with a 1-39% stenosis. Left Carotid:  Velocities in the left ICA are consistent with a 1-39% stenosis. Vertebrals: Left vertebral artery demonstrates antegrade flow. Right vertebral             artery was not visualized. *See table(s) above for measurements and observations.  Electronically signed by Antony Contras MD on 09/17/2019 at 1:46:35 PM.    Final    Stark Klein, MD 09/18/2019, 10:55 AM PGY-1, [place] Intern pager: [phone], text pages welcome

## 2019-09-18 NOTE — Hospital Course (Addendum)
David Burton is a 74 y.o. male who presented with generalized weakness with imaging findings concerning for acute CVA. PMH is significant for type 2 diabetes,  HLD, MI, hypertension, GERD, CKD IIIB.    Acute/Subacute Pontine Stroke  Patient presented with left sided weakness and difficulty walking. Head CT showed findings of hypoattenuation in the area of his right pons. The patient subsequently underwent a MRI which confirmed an acute/subacute infarct consistent with his symptoms. [name] was evaluated by neurology who recommended risk stratification and DAPT (ASA 81 mg and Plavix 75 mg) for 4 additional months and then Plavix 75 mg alone. Patient underwent MRA that showed no significant stenosis.  Bilateral carotid ultrasound 1-39% stenosis in bilateral carotids.  Right vertebral artery not visualized. Patient was evaluated by speech-language pathology who recommended a dysphagia 2 diet. Patient underwent swallow study that showed mild aspiration risk. He remained stable without new neurological deficits during the rest of his hospital stay.   T2DM, likely uncontrolled  Patient admitted with elevated BG of 390. Patient is reported to be on any units of Levemir at home but has not been taking this for close to 1 month at the time of admission.  Patient was started on 10 units of Levemir and titrated appropriately to his home dose.  Patient was recommended for starting Trulicity as outpatient in order to help with blood glucose.  Diabetes coordinator was consulted and provided recommends throughout  Left elbow olecranon bursitis Presented during hospitalization with increasing left elbow discomfort. US imaging showed findings suggests complex olecranon bursitis,hemorrhagic/posttraumatic versus infectious. He did not have any sytemic infectious symptoms. Thought to be due to overuse with increased physical therapy activity. Treated with analgesics and voltaren gel. At time of discharge elbow discomfort  was improving.

## 2019-09-18 NOTE — NC FL2 (Signed)
[place] LEVEL OF CARE SCREENING TOOL     IDENTIFICATION  Patient Name: David Burton Birthdate: 1946-01-28 Sex: male Admission Date (Current Location): 09/16/2019  [facility] and [place] Number:  Herbalist and Address:  [facility]. [facility], [place] [address] [place], [place] [address]      Provider Number: [id number]  Attending Physician Name and Address:  Zenia Resides, MD  Relative Name and Phone Number:  [place] [phone]    Current Level of Care: Hospital Recommended Level of Care: [place] Prior Approval Number:    Date Approved/Denied:   PASRR Number: [id number]  Discharge Plan: SNF    Current Diagnoses: Patient Active Problem List   Diagnosis Date Noted  . Right pontine stroke ([place]) 09/16/2019  . Hypertension with heart disease 12/16/2018  . CAD (coronary artery disease), native coronary artery 12/16/2018  . CKD (chronic kidney disease), stage III 12/16/2018  . Pure hypercholesterolemia 12/16/2018  . Type 2 diabetes, controlled, with peripheral neuropathy ([place]) 12/16/2018    Orientation RESPIRATION BLADDER Height & Weight     Self, Time, Situation, Place  Normal Continent Weight: 245 lb 6 oz (111.3 kg) Height:  5\' 6"  (167.6 cm)  BEHAVIORAL SYMPTOMS/MOOD NEUROLOGICAL BOWEL NUTRITION STATUS      Continent Diet(See discharge summary)  AMBULATORY STATUS COMMUNICATION OF NEEDS Skin   Limited Assist Verbally Normal                       Personal Care Assistance Level of Assistance  Bathing, Feeding, Dressing Bathing Assistance: Limited assistance Feeding assistance: Independent Dressing Assistance: Limited assistance     Functional Limitations Info  Sight, Hearing, Speech Sight Info: Adequate Hearing Info: Adequate Speech Info: Adequate    SPECIAL CARE FACTORS FREQUENCY  PT (By licensed PT), OT (By licensed OT)     PT Frequency: 5 times a week OT Frequency: 5 times a week            Contractures Contractures Info: Not present    Additional Factors Info  Code Status, Allergies Code Status Info: DNR Allergies Info: NKA           Current Medications (09/18/2019):  This is the current hospital active medication list Current Facility-Administered Medications  Medication Dose Route Frequency Provider Last Rate Last Admin  . acetaminophen (TYLENOL) tablet 650 mg  650 mg Oral Q4H PRN Stark Klein, MD   650 mg at 09/18/19 0277   Or  . acetaminophen (TYLENOL) 160 MG/5ML solution 650 mg  650 mg Per Tube Q4H PRN Stark Klein, MD       Or  . acetaminophen (TYLENOL) suppository 650 mg  650 mg Rectal Q4H PRN Stark Klein, MD   650 mg at 09/17/19 1444  . aspirin EC tablet 81 mg  81 mg Oral Daily Stark Klein, MD   81 mg at 09/18/19 0826  . clopidogrel (PLAVIX) tablet 75 mg  75 mg Oral Daily Stark Klein, MD   75 mg at 09/18/19 0826  . enoxaparin (LOVENOX) injection 40 mg  40 mg Subcutaneous Q24H Stark Klein, MD   40 mg at 09/17/19 2231  . insulin aspart (novoLOG) injection 0-5 Units  0-5 Units Subcutaneous QHS Stark Klein, MD   2 Units at 09/17/19 2231  . insulin aspart (novoLOG) injection 0-9 Units  0-9 Units Subcutaneous TID WC Stark Klein, MD   2 Units at 09/18/19 [phone]  . insulin detemir (LEVEMIR) injection 15 Units  15  Units Subcutaneous Daily Stark Klein, MD   15 Units at 09/18/19 1049  . rosuvastatin (CRESTOR) tablet 40 mg  40 mg Oral q1800 Mullis, Kiersten P, DO   40 mg at 09/17/19 1716     Discharge Medications: Please see discharge summary for a list of discharge medications.  Relevant Imaging Results:  Relevant Lab Results:   Additional Information SSN [id number]  [name], [place]

## 2019-09-18 NOTE — TOC Initial Note (Addendum)
Transition of Care [facility]) - Initial/Assessment Note    Patient Details  Name: David Burton MRN: [id number] Date of Birth: [birth year]  Transition of Care [facility]) CM/SW Contact:    Arvella Merles, [place] Phone Number: 09/18/2019, 1:20 PM  Clinical Narrative:                 4:30p CSW contacted by RN and informed patient's daughters [name] [phone] & [name] are present in the room, requesting to speak with CSW. CSW met bedside with patient and daughters. After permission from patient, CSW provided information that was previously discussed with patient, to daughters which included the Medicare ratings list. Daughters asked to speak with MD regarding patient's care, CSW contacted MD with the request. CSW provided family with [facility] SNF options and agreed to follow-up with any bed offers. No further questions at this time, CSW will continue to follow.  1:00p CSW received consult for possible SNF placement at time of discharge. CSW met bedside with patient to discuss PT recommendation. Patient expressed understanding of the recommendation for a short-term stay at SNF and stated he is in agreement with the recommendation. CSW discussed insurance authorization process and provided patient with a Medicare ratings list. CSW asked patient if there is anyone he would like contacted, patient declined. No further questions expressed at this time.  CSW initiated Ship broker, reference [id number]. CSW will continue to follow and assist with discharge planning needs.   Expected Discharge Plan: Skilled Nursing Facility Barriers to Discharge: Continued Medical Work up, Ship broker   Patient Goals and CMS Choice Patient states their goals for this hospitalization and ongoing recovery are:: To return home CMS Medicare.gov Compare Post Acute Care list provided to:: Patient Choice offered to / list presented to : Patient  Expected Discharge Plan and Services Expected Discharge Plan:  [place] arrangements for the past 2 months: Apartment                                      Prior Living Arrangements/Services Living arrangements for the past 2 months: Apartment Lives with:: Self Patient language and need for interpreter reviewed:: Yes Do you feel safe going back to the place where you live?: Yes      Need for Family Participation in Patient Care: No (Comment) Care giver support system in place?: Yes (comment)   Criminal Activity/Legal Involvement Pertinent to Current Situation/Hospitalization: No - Comment as needed  Activities of Daily Living      Permission Sought/Granted Permission sought to share information with : Facility Art therapist granted to share information with : Yes, Verbal Permission Granted     Permission granted to share info w AGENCY: SNFs        Emotional Assessment   Attitude/Demeanor/Rapport: Unable to Assess Affect (typically observed): Unable to Assess Orientation: : Oriented to Self, Oriented to Place, Oriented to  Time, Oriented to Situation Alcohol / Substance Use: Not Applicable Psych Involvement: No (comment)  Admission diagnosis:  Dizziness [R42] Generalized weakness [R53.1] Right pontine stroke (HCC) [I63.50] Acute nonintractable headache, unspecified headache type [R51.9] Patient Active Problem List   Diagnosis Date Noted  . Right pontine stroke ([place]) 09/16/2019  . Hypertension with heart disease 12/16/2018  . CAD (coronary artery disease), native coronary artery 12/16/2018  . CKD (chronic kidney disease), stage III 12/16/2018  . Pure hypercholesterolemia 12/16/2018  . Type  2 diabetes, controlled, with peripheral neuropathy ([place]) 12/16/2018   PCP:  Harvie Junior, MD Pharmacy:   [facility] [place], [place] [address] [place] [address] Phone: [phone]  Fax: [phone]     Social Determinants of Health (SDOH) Interventions    Readmission Risk Interventions No flowsheet data found.

## 2019-09-18 NOTE — Discharge Summary (Addendum)
[facility] Discharge Summary  Patient name: David Burton Medical record number: [id number] Date of birth: [birth year] Age: 74 y.o. Gender: male Date of Admission: 09/16/2019  Date of Discharge: 09/21/2019 Admitting Physician: Stark Klein, MD  Primary Care Provider: Harvie Junior, MD Consultants: Neurology  Indication for Hospitalization: left sided weakness, subacute pontine stroke   Discharge Diagnoses/Problem List:  Active Problems:   Right pontine stroke [facility])  Disposition: SNF  Discharge Condition: Stable  Discharge Exam:  General: Appears well, no acute distress. Age appropriate. Cardiac: RRR, normal heart sounds, no murmurs Respiratory: CTAB, normal effort Neuro: alert and oriented, right facial droop  Psych: normal affect  Brief Hospital Course:  David Burton is a 74 y.o. male who presented with generalized weakness with imaging findings concerning for acute CVA. PMH is significant for type 2 diabetes,  HLD, MI, hypertension, GERD, CKD IIIB.    Acute/Subacute Pontine Stroke  Patient presented with left sided weakness and difficulty walking. Head CT showed findings of hypoattenuation in the area of his right pons. The patient subsequently underwent a MRI which confirmed an acute/subacute infarct consistent with his symptoms. David Burton was evaluated by neurology who recommended risk stratification and DAPT (ASA 81 mg and Plavix 75 mg) for 4 additional months and then Plavix 75 mg alone. Patient underwent MRA that showed no significant stenosis.  Bilateral carotid ultrasound 1-39% stenosis in bilateral carotids.  Right vertebral artery not visualized. Patient was evaluated by speech-language pathology who recommended a dysphagia 2 diet. Patient underwent swallow study that showed mild aspiration risk. He remained stable without new neurological deficits during the rest of his hospital stay.   T2DM, likely uncontrolled  Patient admitted with  elevated BG of 390. Patient is reported to be on any units of Levemir at home but has not been taking this for close to 1 month at the time of admission.  Patient was started on 10 units of Levemir and titrated appropriately to his home dose.  Patient was recommended for starting Trulicity as outpatient in order to help with blood glucose.  Diabetes coordinator was consulted and provided recommends throughout  Left elbow olecranon bursitis Presented during hospitalization with increasing left elbow discomfort. US imaging showed findings suggests complex olecranon bursitis,hemorrhagic/posttraumatic versus infectious. He did not have any sytemic infectious symptoms. Thought to be due to overuse with increased physical therapy activity. Treated with analgesics and voltaren gel. At time of discharge elbow discomfort was improving.   Issues for Follow Up:  1. Patient was started on Plavix 75 mg and aspirin 81 mg daily.  Patient should continue this course for an additional 4 months (end in 01/27/2020).  Patient should then continue on Plavix 75 mg.  Please confirm that the patient is following this regimen. 2. Confirm patient is checking blood glucose levels with meals and AM fasting. Recommend starting Trulicity as outpatient to help lower insulin requirements as patient expressed difficulty with keeping up with his diabetes regimen.  3. Did not resume metformin at discharge. Consider restarting when kidney function more stable.  Significant Procedures: None   Significant Labs and Imaging:  Recent Labs  Lab 09/16/19 0835 09/21/19 0501  WBC 9.6 8.7  HGB 15.2 13.3  HCT 45.2 39.4  PLT 165 135*   Recent Labs  Lab 09/16/19 0835 09/16/19 0835 09/17/19 0202 09/17/19 0202 09/18/19 0232 09/18/19 0232 09/20/19 1128 09/21/19 0501  NA 136  --  139  --  137  --  138 135  K 4.8   < >  4.9   < > 4.6   < > 4.5 4.5  CL 99  --  105  --  109  --  104 106  CO2 26  --  22  --  20*  --  26 21*  GLUCOSE 390*   --  207*  --  225*  --  179* 220*  BUN 51*  --  58*  --  37*  --  24* 23  CREATININE 2.63*  --  2.19*  --  1.74*  --  1.63* 1.75*  CALCIUM 9.6  --  9.3  --  9.0  --  9.5 9.3  ALKPHOS 87  --   --   --   --   --   --   --   AST 17  --   --   --   --   --   --   --   ALT 22  --   --   --   --   --   --   --   ALBUMIN 3.8  --   --   --   --   --   --   --    < > = values in this interval not displayed.    ULTRASOUND left UPPER EXTREMITY LIMITED COMPARISON:  None. IMPRESSION: Findings suggests complex olecranon bursitis, hemorrhagic/posttraumatic versus infectious.  MRA HEAD WITHOUT CONTRAST COMPARISON:  Brain MRI same day IMPRESSION: Normal brain MRA.  MRI HEAD WITHOUT CONTRAST COMPARISON:  Non-contrast head CT performed earlier the same day 09/16/2019 IMPRESSION: 1. Mildly motion degraded exam. 2. Confirmed 17 mm acute/early subacute infarct within the right pons. 3. Background mild chronic small vessel ischemic disease within the cerebral white matter and pons. Tiny chronic lacunar infarct within the left cerebellum. 4. Mild generalized parenchymal atrophy. 5. Mild paranasal sinus mucosal thickening. Trace left mastoid effusion.  CT HEAD WITHOUT CONTRAST COMPARISON:  MRI 07/10/2011 (images only) IMPRESSION: 1. Focal region of hypoattenuation in the right pons (2/10) given the patient's symptom onset timing, finding raises suspicion for a subacute infarct additional hypoattenuation centrally in the medulla has an appearance more compatible with streak artifact from the skull base. Consider further evaluation with MRI. 2. Chronic microvascular angiopathy and parenchymal volume loss. 3. Extensive debris in the bilateral external auditory canals including complete impaction of the right external auditory canal. Correlate with visual inspection for cerumen impaction. 4. Trace right mastoid effusion.  Echocardiogram: IMPRESSIONS  1. Left ventricular ejection fraction, by  estimation, is 55 to 60%. The  left ventricle has normal function. The left ventricle has no regional  wall motion abnormalities. Left ventricular diastolic parameters are  consistent with Grade II diastolic  dysfunction (pseudonormalization). Elevated left ventricular end-diastolic  pressure.  2. Right ventricular systolic function was not well visualized. The right  ventricular size is mildly enlarged. Tricuspid regurgitation signal is  inadequate for assessing PA pressure.  3. Left atrial size was mildly dilated.  4. Right atrial size was mildly dilated.  5. The mitral valve is normal in structure. Trivial mitral valve  regurgitation.  6. The aortic valve is tricuspid. Aortic valve regurgitation is not  visualized. Mild aortic valve sclerosis is present, with no evidence of  aortic valve stenosis.  7. The inferior vena cava is normal in size with greater than 50%  respiratory variability, suggesting right atrial pressure of 3 mmHg.   Conclusion(s)/Recommendation(s): No intracardiac source of embolism  detected on this transthoracic study. A transesophageal echocardiogram is  recommended to exclude cardiac source of embolism if clinically indicated.  Consider TEE if suspision for  cardioembolic phenomenon suspected.  Results/Tests Pending at Time of Discharge: N/A  Discharge Medications:  Allergies as of 09/21/2019   No Known Allergies     Medication List    STOP taking these medications   amLODipine 5 MG tablet Commonly known as: NORVASC   hydrALAZINE 50 MG tablet Commonly known as: APRESOLINE   ibuprofen 200 MG tablet Commonly known as: ADVIL   insulin aspart 100 UNIT/ML FlexPen Commonly known as: NOVOLOG Replaced by: insulin aspart 100 UNIT/ML injection   isosorbide mononitrate 60 MG 24 hr tablet Commonly known as: IMDUR   metFORMIN 500 MG tablet Commonly known as: GLUCOPHAGE   simvastatin 40 MG tablet Commonly known as: ZOCOR     TAKE these  medications   aspirin EC 81 MG tablet Take 1 tablet (81 mg total) by mouth daily. What changed: how much to take   clopidogrel 75 MG tablet Commonly known as: Plavix Take 1 tablet (75 mg total) by mouth daily. Start taking on: September 22, 2019   diclofenac Sodium 1 % Gel Commonly known as: VOLTAREN Apply 2 g topically 4 (four) times daily.   HYDROcodone-acetaminophen 10-325 MG tablet Commonly known as: NORCO Take 1 tablet by mouth 3 (three) times daily as needed for moderate pain.   insulin aspart 100 UNIT/ML injection Commonly known as: novoLOG Inject 0-9 Units into the skin 3 (three) times daily with meals. Replaces: insulin aspart 100 UNIT/ML FlexPen   insulin aspart 100 UNIT/ML injection Commonly known as: novoLOG Inject 0-5 Units into the skin at bedtime.   Levemir 100 UNIT/ML injection Generic drug: insulin detemir Inject 20 Units into the skin at bedtime.   losartan 25 MG tablet Commonly known as: COZAAR Take 0.5 tablets (12.5 mg total) by mouth daily. Start taking on: September 22, 2019   metoprolol tartrate 25 MG tablet Commonly known as: LOPRESSOR Take 0.5 tablets (12.5 mg total) by mouth 2 (two) times daily.   nitroGLYCERIN 0.4 MG SL tablet Commonly known as: NITROSTAT Place 1 tablet (0.4 mg total) under the tongue every 5 (five) minutes as needed for chest pain.   omeprazole 20 MG capsule Commonly known as: PRILOSEC Take 1 capsule (20 mg total) by mouth daily.   rosuvastatin 40 MG tablet Commonly known as: CRESTOR Take 1 tablet (40 mg total) by mouth daily at 6 PM.       Discharge Instructions: Please refer to Patient Instructions section of EMR for full details.  Patient was counseled important signs and symptoms that should prompt return to medical care, changes in medications, dietary instructions, activity restrictions, and follow up appointments.   Follow-Up Appointments: Follow-up Information    Harvie Junior, MD. Schedule an appointment as  soon as possible for a visit in 1 day(s).   Specialty: Family Medicine Why: For hospital f/u and diabetes control within the next week.  Contact information: [place] [address] [phone]        Call  [place].   Specialty: Emergency Medicine Why: If symptoms worsen Contact information: [address] [id number] [place] [phone]       [facility]. Go to.   Specialty: Neurology Why: Follow up stroke  Contact information: [address] [place] [phone]         Gerlene Fee, DO 09/21/2019, 12:12 PM PGY-1, [facility]

## 2019-09-18 NOTE — Progress Notes (Signed)
STROKE TEAM PROGRESS NOTE   INTERVAL HISTORY Two daughters are at bedside. Pt still has mild left sided weakness but awake alert. Admitted that he uses THC but denies cocaine. His UDS showed positive for cocaine.   Vitals:   09/17/19 2314 09/18/19 0418 09/18/19 0750 09/18/19 1203  BP: (!) 113/53 (!) 119/58 130/60 119/67  Pulse: 67 62 61 64  Resp: 17 16 15 18   Temp: 98.5 F (36.9 C) 98.2 F (36.8 C) 98.3 F (36.8 C) 98.3 F (36.8 C)  TempSrc:  Oral Oral Oral  SpO2: 100% 97% 96% 100%  Weight:      Height:        CBC:  Recent Labs  Lab 09/16/19 0835  WBC 9.6  HGB 15.2  HCT 45.2  MCV 89.0  PLT 616    Basic Metabolic Panel:  Recent Labs  Lab 09/17/19 0202 09/18/19 0232  NA 139 137  K 4.9 4.6  CL 105 109  CO2 22 20*  GLUCOSE 207* 225*  BUN 58* 37*  CREATININE 2.19* 1.74*  CALCIUM 9.3 9.0   Lipid Panel:     Component Value Date/Time   CHOL 153 09/17/2019 0202   TRIG 269 (H) 09/17/2019 0202   HDL 25 (L) 09/17/2019 0202   CHOLHDL 6.1 09/17/2019 0202   VLDL 54 (H) 09/17/2019 0202   LDLCALC 74 09/17/2019 0202   HgbA1c:  Lab Results  Component Value Date   HGBA1C 10.8 (H) 09/17/2019   Urine Drug Screen:     Component Value Date/Time   LABOPIA NONE DETECTED 09/18/2019 0420   COCAINSCRNUR POSITIVE (A) 09/18/2019 0420   LABBENZ NONE DETECTED 09/18/2019 0420   AMPHETMU NONE DETECTED 09/18/2019 0420   THCU NONE DETECTED 09/18/2019 0420   LABBARB NONE DETECTED 09/18/2019 0420    Alcohol Level     Component Value Date/Time   ETH 106 (H) 01/02/2015 1801    IMAGING past 24 hours No results found.  PHYSICAL EXAM Constitutional: Obese elderly male appears well-developed and well-nourished. Obese Psych: Affect appropriate to situation Eyes: No scleral injection HENT: No OP obstrucion MSK: no joint deformities.  Cardiovascular: Normal rate and regular rhythm.  Respiratory: Effort normal, non-labored breathing GI: Soft.  No distension. There is no  tenderness.  Skin: WDI  Neuro: Mental Status: Patient is awake, alert, oriented to person, place, month, year, and situation. Patient is able to give a clear and coherent history. No signs of aphasia or neglect Cranial Nerves: II: Visual Fields are full. Pupils are equal, round, and reactive to light.   III,IV, VI: EOMI without ptosis or diploplia.  V: Facial sensation is symmetric to temperature VII: Facial movement is diminished on the right both upper and lower (old Bell's palsy) VIII: hearing is intact to voice X: Uvula elevates symmetrically XI: Shoulder shrug is symmetric. XII: tongue is midline without atrophy or fasciculations.  Motor: Tone is normal. Bulk is normal. 5/5 strength was present on the right, he has 5-/5 weakness left arm but moderate left hand dexterity difficulty and 4/5 left LE strength. RUE and RLE 5/5. Sensory: Sensation is symmetric to light touch and temperature in the arms and legs. Cerebellar: He has dysmetria out of proportion to weakness in the left arm.   ASSESSMENT/PLAN David Burton is a 74 y.o. male with PMH of HTN, DM, HLD presenting with intermittent and worsening left side weakness x1 week with difficulty walking.   Stroke: Right pontine lacunar infarct secondary to small vessel disease   MRI  Right Pontine infarct   MRA  normal  Carotid Doppler no significant stenosis  2D Echo pending  UDS - positive for cocaine  LDL 74  HgbA1c 10.8  Lovenox for VTE prophylaxis  ASA prior to admission, now on aspirin and Plavix for 3 weeks followed by Plavix alone.  Therapy recommendations:  SNF  Disposition:  pending  Hypertension  Home meds:  Nitro, zocor, norvasc, apresoline, imdur  Stable . Gradually normalize in 2-3 days . Long-term BP goal normotensive  Hyperlipidemia  Home meds: none   LDL 74, goal < 70  Now on Crestor 40mg   Continue statin at discharge  Diabetes type II Uncontrolled w/hyperglycemia  Home meds:  Novolog, levemir, glucophage  HgbA1c 10.8, goal < 7.0  CBGs  SSI  On levemir  Close PCP follow up  Cocaine abuse  UDS positive for cocaine  Pt denies cocaine use in front of daughters  General Illicit drug cessation education provided to him  Other Stroke Risk Factors  Advanced age  Previous Cigarette smoker  Obesity, Body mass index is 39.6 kg/m., recommend weight loss, diet and exercise as appropriate   CAD w/MI in past w/stents (prev on Plavix for this, but stopped in last yr)  Family hx stroke  THC use - cessation education provided and he is willing to quit THC  Other Active Problems  AKI on CKD 3a - creatinine - 2.19->1.74  Hx of Bells palsy on right side of face  Hospital day # 0  Neurology will sign off. Please call with questions. Pt will follow up with stroke clinic NP at [facility] in about 4 weeks. Thanks for the consult.  David Hawking, MD PhD Stroke Neurology 09/18/2019 5:38 PM  To contact Stroke Continuity provider, please refer to [email or web address] After hours, contact General Neurology

## 2019-09-19 DIAGNOSIS — R531 Weakness: Secondary | ICD-10-CM | POA: Diagnosis not present

## 2019-09-19 DIAGNOSIS — I6350 Cerebral infarction due to unspecified occlusion or stenosis of unspecified cerebral artery: Secondary | ICD-10-CM | POA: Diagnosis not present

## 2019-09-19 LAB — GLUCOSE, CAPILLARY
Glucose-Capillary: 201 mg/dL — ABNORMAL HIGH (ref 70–99)
Glucose-Capillary: 205 mg/dL — ABNORMAL HIGH (ref 70–99)
Glucose-Capillary: 213 mg/dL — ABNORMAL HIGH (ref 70–99)
Glucose-Capillary: 243 mg/dL — ABNORMAL HIGH (ref 70–99)

## 2019-09-19 NOTE — Progress Notes (Signed)
Family Medicine Teaching Service Daily Progress Note Intern Pager: [phone]  Patient name: David Burton Medical record number: [id number] Date of birth: 07-17-1945 Age: 74 y.o. Gender: male  Primary Care Provider: Harvie Junior, MD Consultants: Neurology Code Status: DNR   Pt Overview and Major Events to Date:  Hospital Day: 4 09/16/2019: admitted for Weakness, Dizziness, and Headache   Assessment and Plan: David Burton is a 74 y.o. male who presented w/ generalized weakness found to have acute CVA.  Past medical history significant for type 2 diabetes, HLD, MI, hypertension, GERD, CKD 3B..   CVA Right [place] No changes in neurologic exam today.  Patient alert and oriented x4.  No complaints at this time.  Continues to have dizziness and balance difficulties when attempting to ambulate.  Neuro on board, appreciate recommendations  PT/OT on board, Recs for SNF  Continue meds: plavix 75, ASA 81, rosuvastatin 40  F/u TEE read - Call cards Monday 4/19   Patient stable for discharge, CIR pending   SLP following, appreciate recs. Currently dysphagia 2 diet  IDDM 2 Peripheral neuropathy  A1C 10.8 (09/17/19). At home, 20 units levemir nightly with 8 to 10 units at mealtime. Increased to 15 units levemir yesterday with sSSI and QHS coverage. Fasting  CBG this Am 201. 175-286 range in last 24 hours. No medications for neuropathy at this time  Increase levemir to 20 units daily.   Once stable, discontinue nightly coverage  HTN CAD (s/p PCI 01/26/19), MI HFmrEF 40-45% EF Still waiting on echocardiogram read.  No lower extremity edema, shortness of breath, crackles on exam.  At home, patient's medication list includes hydralazine 50 mg TID, Imdur 24 hours 60 mg, nitro, amlodipine 5 mg.  Patient's blood pressures ranging from 105-130/51-61 in the last 24 hours.  Heart rates are normal.  Continue to hold blood pressure medications with stable blood pressures  Continue ASA,  Plavix  CKD 3B Creatinine elevated on admission. No labs ordered for this morning.  Discontinued maintenance fluids.  F/u BMP, if Cr stable, can d/c daily BMP   Avoid nephrotoxic medications  Discontinue ibuprofen on patient's medication list prior to discharge  HLD Lipid panel obtained for risk ratification.  Increased rosuvastatin to 40 mg  Continue 40 mg rosuvastatin  #GERD Patient takes Prilosec 20 mg at home daily.  Currently not ordered.  Can restart per patient compalint  #FEN/GI:  . Fluids: Dysphagia 2 per SLP recs  Access: Left PIV (day 3) VTE prophylaxis: Lovenox 40 (CrCl>30)  Disposition: CIR, pending     Subjective:  NAEO. Reports no appetite this morning, but typically does not eat breakfast at home anyway.  But, also reports difficulties with chewing due to Bell's palsy.  No changes in neuro status. Has not been up and walking with PT just yet, but would like to try.   Objective: Temp:  [97.8 F (36.6 C)-98.6 F (37 C)] 97.8 F (36.6 C) (04/18 0859) Pulse Rate:  [64-81] 81 (04/18 0859) Cardiac Rhythm: Normal sinus rhythm (04/18 0700) Resp:  [17-20] 20 (04/18 0859) BP: (105-152)/(51-77) 129/61 (04/18 0859) SpO2:  [95 %-100 %] 98 % (04/18 0859) Intake/Output      04/17 0701 - 04/18 0700 04/18 0701 - 04/19 0700   P.O. 222    I.V. (mL/kg)     Total Intake(mL/kg) 222 (2)    Urine (mL/kg/hr) 900 (0.3) 475 (2)   Total Output 900 475   Net -678 -475  Physical Exam: General: NAD, non-toxic, well-appearing, sitting comfortably in chair with legs propped up.    HEENT: [place]/AT. PERRLA. EOMI. Right sided palsy Cardiovascular: RRR, normal S1, S2. B/L 2+ RP.  No BLEE Respiratory: CTAB. No IWOB.  Abdomen: Obese, + BS. NT, ND, soft to palpation.  Extremities: Warm and well perfused. Moving spontaneously.  Integumentary: No obvious rashes, lesions, trauma on general exam. Neuro: A & O x4. Sensation intact, right sided facial droop.   Laboratory: I  have personally read and reviewed all labs and imaging studies.  CBC: Recent Labs  Lab 09/16/19 0835  WBC 9.6  HGB 15.2  HCT 45.2  MCV 89.0  PLT 165   CMP: Recent Labs  Lab 09/16/19 0835 09/17/19 0202 09/18/19 0232  NA 136 139 137  K 4.8 4.9 4.6  CL 99 105 109  CO2 26 22 20*  GLUCOSE 390* 207* 225*  BUN 51* 58* 37*  CREATININE 2.63* 2.19* 1.74*  CALCIUM 9.6 9.3 9.0  ALBUMIN 3.8  --   --    CBG: Recent Labs  Lab 09/18/19 0604 09/18/19 1204 09/18/19 1603 09/18/19 2112 09/19/19 0600  GLUCAP 192* 175* 233* 286* 201*   Micro: Covid Negative  Recent Results (from the past 240 hour(s))  SARS CORONAVIRUS 2 (TAT 6-24 HRS) Nasopharyngeal Nasopharyngeal Swab     Status: None   Collection Time: 09/16/19  7:13 PM   Specimen: Nasopharyngeal Swab  Result Value Ref Range Status   SARS Coronavirus 2 NEGATIVE NEGATIVE Final    Comment: (NOTE) SARS-CoV-2 target nucleic acids are NOT DETECTED. The SARS-CoV-2 RNA is generally detectable in upper and lower respiratory specimens during the acute phase of infection. Negative results do not preclude SARS-CoV-2 infection, do not rule out co-infections with other pathogens, and should not be used as the sole basis for treatment or other patient management decisions. Negative results must be combined with clinical observations, patient history, and epidemiological information. The expected result is Negative. Fact Sheet for Patients: SugarRoll.be Fact Sheet for Healthcare Providers: https://www.woods-mathews.com/ This test is not yet approved or cleared by the [place] FDA and  has been authorized for detection and/or diagnosis of SARS-CoV-2 by FDA under an Emergency Use Authorization (EUA). This EUA will remain  in effect (meaning this test can be used) for the duration of the COVID-19 declaration under Section 56 4(b)(1) of the Act, 21 U.S.C. section 360bbb-3(b)(1), unless the  authorization is terminated or revoked sooner. Performed at [facility], [place] [address] [place], [place] [address]      Imaging/Diagnostic Tests: VAS US CAROTID  Result Date: 09/17/2019 Carotid Arterial Duplex Study Indications:       CVA. Risk Factors:      Hypertension, Diabetes, coronary artery disease, prior CVA. Limitations        Today's exam was limited due to the body habitus of the                    patient. Comparison Study:  no prior Performing Technologist: Abram Sander RVS  Examination Guidelines: A complete evaluation includes B-mode imaging, spectral Doppler, color Doppler, and power Doppler as needed of all accessible portions of each vessel. Bilateral testing is considered an integral part of a complete examination. Limited examinations for reoccurring indications may be performed as noted.  Right Carotid Findings: +----------+--------+--------+--------+------------------+--------+           PSV cm/sEDV cm/sStenosisPlaque DescriptionComments +----------+--------+--------+--------+------------------+--------+ CCA Prox  83      11  heterogenous               +----------+--------+--------+--------+------------------+--------+ CCA Distal80      9               heterogenous               +----------+--------+--------+--------+------------------+--------+ ICA Prox  76      22      1-39%   heterogenous               +----------+--------+--------+--------+------------------+--------+ ICA Distal49      13                                         +----------+--------+--------+--------+------------------+--------+ ECA       62                                                 +----------+--------+--------+--------+------------------+--------+ +----------+--------+-------+--------+-------------------+           PSV cm/sEDV cmsDescribeArm Pressure (mmHG) +----------+--------+-------+--------+-------------------+ [id number]                                          +----------+--------+-------+--------+-------------------+ +---------+--------+--------+------------+ VertebralPSV cm/sEDV cm/sNot assessed +---------+--------+--------+------------+  Left Carotid Findings: +----------+--------+--------+--------+------------------+--------+           PSV cm/sEDV cm/sStenosisPlaque DescriptionComments +----------+--------+--------+--------+------------------+--------+ CCA Prox  140     17              heterogenous               +----------+--------+--------+--------+------------------+--------+ CCA Distal62      13              heterogenous               +----------+--------+--------+--------+------------------+--------+ ICA Prox  65      20      1-39%   heterogenous               +----------+--------+--------+--------+------------------+--------+ ICA Distal54      18                                         +----------+--------+--------+--------+------------------+--------+ ECA       64      5                                          +----------+--------+--------+--------+------------------+--------+ +----------+--------+--------+--------+-------------------+           PSV cm/sEDV cm/sDescribeArm Pressure (mmHG) +----------+--------+--------+--------+-------------------+ [id number]                                          +----------+--------+--------+--------+-------------------+ +---------+--------+--+--------+-+---------+ VertebralPSV cm/s35EDV cm/s8Antegrade +---------+--------+--+--------+-+---------+   Summary: Right Carotid: Velocities in the right ICA are consistent with a 1-39% stenosis. Left Carotid: Velocities in the left ICA are consistent with a 1-39% stenosis. Vertebrals: Left vertebral artery demonstrates antegrade flow. Right vertebral  artery was not visualized. *See table(s) above for measurements and observations.  Electronically signed by Antony Contras MD  on 09/17/2019 at 1:46:35 PM.    Final     EKG Interpretation  Date/Time:  Thursday September 16 2019 08:15:57 EDT Ventricular Rate:  68 PR Interval:  160 QRS Duration: 110 QT Interval:  412 QTC Calculation: 438 R Axis:   -45 Text Interpretation: Normal sinus rhythm Left axis deviation Incomplete right bundle branch block Minimal voltage criteria for LVH, may be normal variant ( R in aVL ) Nonspecific ST abnormality Abnormal ECG Confirmed by Pattricia Boss [phone]) on 09/16/2019 9:39:07 AM        Procedures:  TEE   Wilber Oliphant, MD 09/19/2019, 9:08 AM PGY-2, [place] Intern pager: [phone], text pages welcome

## 2019-09-19 NOTE — Progress Notes (Signed)
Occupational Therapy Evaluation Patient Details Name: David Burton MRN: [id number] DOB: Aug 11, 1945 Today's Date: 09/19/2019    History of Present Illness Pt is a 74 y/o male admitted secondary to L sided weakness. Imaging revealed R pons infarct. Pt with chronic R sided facial droop secondary to bells palsy. PMH includes DM, CVA, HTN, CKD, MI, and CAD.    Clinical Impression   PTA pt resided alone, independent in all ADL, IADL, and mobility tasks. Pt does not ambulate with an AD and reports 0 falls in the last 6 months. Pt currently requires setup to mod assist for self-care and functional transfer tasks. Pt tolerated sitting EOB 10 min with supervision while engaging in self-care tasks. Educated pt on safety strategies and activity pacing to increase balance and prevent future falls with fair understanding. Pt able to transfer to bedside chair with RW and mod assist for balance. Noted 1 instance of retro LOB with pt requiring assist to self-correct. LLE buckling throughout transfer tasks. Pt required cues throughout for safety. Pt demonstrates decreased [place], [place], strength, endurance, balance, standing tolerance, activity tolerance, and safety awareness impacting ability to complete self-care and functional transfer tasks. Recommend skilled OT services to address above deficits in order to promote function and prevent further decline. Recommend CIR for additional rehab prior to discharge home.     Follow Up Recommendations  CIR;Supervision/Assistance - 24 hour    Equipment Recommendations  Other (comment)(TBD at next venue of care)    Recommendations for Other Services Rehab consult     Precautions / Restrictions Precautions Precautions: Fall Restrictions Weight Bearing Restrictions: No      Mobility Bed Mobility Overal bed mobility: Needs Assistance Bed Mobility: Supine to Sit     Supine to sit: Min assist;HOB elevated     General bed mobility comments: Assist to ensure balance  and safety  Transfers Overall transfer level: Needs assistance Equipment used: Rolling walker (2 wheeled) Transfers: Sit to/from [facility] Sit to Stand: Min assist Stand pivot transfers: Mod assist       General transfer comment: Assist for balance and safety. 1 instance of retro LOB with pt requiring mod assist to self-correct.     Balance Overall balance assessment: Needs assistance Sitting-balance support: No upper extremity supported;Feet supported Sitting balance-Leahy Scale: Good       Standing balance-Leahy Scale: Poor                             ADL either performed or assessed with clinical judgement   ADL Overall ADL's : Needs assistance/impaired Eating/Feeding: Set up;Sitting   Grooming: Set up;Supervision/safety;Wash/dry hands;Wash/dry face;Brushing hair;Sitting   Upper Body Bathing: Set up;Supervision/ safety;Sitting   Lower Body Bathing: Moderate assistance;Sit to/from stand Lower Body Bathing Details (indicate cue type and reason): for balance in standing Upper Body Dressing : Sitting;Set up;Supervision/safety   Lower Body Dressing: Minimal assistance;Sit to/from stand   Toilet Transfer: Moderate assistance;BSC   Toileting- Clothing Manipulation and Hygiene: Moderate assistance;Sit to/from stand       Functional mobility during ADLs: Moderate assistance;Rolling walker General ADL Comments: Pt tolerated sitting EOB 10 min with supervision while engaging in self-care tasks. Pt able to transfer to bedside chair with RW and mod assist for balance.      Vision Baseline Vision/History: No visual deficits Vision Assessment?: Yes Ocular Range of Motion: Within Functional Limits Tracking/Visual Pursuits: Able to track stimulus in all quads without difficulty Convergence: Impaired (comment) Additional Comments:  Peripheral vision intact     Perception     Praxis      Pertinent Vitals/Pain Pain Assessment: No/denies pain      Hand Dominance Right   Extremity/Trunk Assessment Upper Extremity Assessment Upper Extremity Assessment: LUE deficits/detail LUE Deficits / Details: LUE 4-/5 LUE Sensation: WNL LUE Coordination: decreased fine motor;decreased gross motor   Lower Extremity Assessment Lower Extremity Assessment: Defer to PT evaluation       Communication Communication Communication: No difficulties   Cognition Arousal/Alertness: Awake/alert Behavior During Therapy: WFL for tasks assessed/performed Overall Cognitive Status: No family/caregiver present to determine baseline cognitive functioning                                 General Comments: A&O x 4. Pt pleasant and willing to participate in therapy. Pt able to follow 75% of simple one step commands. Cues for safety.    General Comments  No signs/symptoms of distress    Exercises     Shoulder Instructions      Home Living Family/patient expects to be discharged to:: Private residence Living Arrangements: Alone Available Help at Discharge: Family;Available PRN/intermittently Type of Home: Apartment(5th floor with elevator access) Home Access: Elevator     Home Layout: One level     Bathroom Shower/Tub: Teacher, early years/pre: Standard     Home Equipment: None   Additional Comments: Pt has call bell on the wall that contacts 911      Prior Functioning/Environment Level of Independence: Independent        Comments: Pt independent with ADLs, IADLs, and mobility. Pt does not ambulate with an assistive device and reports 0 falls in the last 6 months. Pt still drives. Pt is a retired Administrator.         OT Problem List: Decreased strength;Decreased activity tolerance;Impaired balance (sitting and/or standing);Decreased coordination;Decreased cognition;Decreased safety awareness      OT Treatment/Interventions: Self-care/ADL training;Therapeutic exercise;Neuromuscular education;DME and/or AE  instruction;Energy conservation;Therapeutic activities;Cognitive remediation/compensation;Patient/family education;Balance training    OT Goals(Current goals can be found in the care plan section) Acute Rehab OT Goals Patient Stated Goal: to be more independent Time For Goal Achievement: 10/03/19 Potential to Achieve Goals: Good ADL Goals Pt Will Perform Grooming: with min guard assist;standing Pt Will Perform Lower Body Bathing: with min guard assist;sit to/from stand Pt Will Perform Lower Body Dressing: with min guard assist;sit to/from stand Pt Will Transfer to Toilet: with min guard assist;bedside commode Pt Will Perform Toileting - Clothing Manipulation and hygiene: with min guard assist;sit to/from stand Pt/caregiver will Perform Home Exercise Program: Increased strength;Left upper extremity;With written HEP provided;With Supervision Additional ADL Goal #1: Pt to tolerate standing up to 5 min with min guard, in preparation for ADLs. Additional ADL Goal #2: Pt to increase LUE [facility] with written HEP and supervision.  OT Frequency: Min 3X/week   Barriers to D/C:            Co-evaluation              AM-PAC OT "6 Clicks" Daily Activity     Outcome Measure Help from another person eating meals?: A Little Help from another person taking care of personal grooming?: A Little Help from another person toileting, which includes using toliet, bedpan, or urinal?: A Lot Help from another person bathing (including washing, rinsing, drying)?: A Lot Help from another person to put on and taking off regular upper  body clothing?: A Little Help from another person to put on and taking off regular lower body clothing?: A Little 6 Click Score: 16   End of Session Equipment Utilized During Treatment: Gait belt;Rolling walker Nurse Communication: Mobility status  Activity Tolerance: Patient tolerated treatment well Patient left: in chair;with call bell/phone within reach;with chair alarm  set  OT Visit Diagnosis: Unsteadiness on feet (R26.81);Muscle weakness (generalized) (M62.81)                Time: 8978-4784 OT Time Calculation (min): 28 min Charges:  OT General Charges $OT Visit: 1 Visit OT Evaluation $OT Eval Moderate Complexity: 1 Mod OT Treatments $Self Care/Home Management : 8-22 mins  Mauri Brooklyn OTR/L [phone]  Mauri Brooklyn 09/19/2019, 8:49 AM

## 2019-09-20 DIAGNOSIS — I6350 Cerebral infarction due to unspecified occlusion or stenosis of unspecified cerebral artery: Secondary | ICD-10-CM | POA: Diagnosis not present

## 2019-09-20 DIAGNOSIS — R531 Weakness: Secondary | ICD-10-CM | POA: Diagnosis not present

## 2019-09-20 LAB — ECHOCARDIOGRAM COMPLETE
Height: 66 in
Weight: 4000 oz

## 2019-09-20 LAB — BASIC METABOLIC PANEL
Anion gap: 8 (ref 5–15)
BUN: 24 mg/dL — ABNORMAL HIGH (ref 8–23)
CO2: 26 mmol/L (ref 22–32)
Calcium: 9.5 mg/dL (ref 8.9–10.3)
Chloride: 104 mmol/L (ref 98–111)
Creatinine, Ser: 1.63 mg/dL — ABNORMAL HIGH (ref 0.61–1.24)
GFR calc Af Amer: 48 mL/min — ABNORMAL LOW (ref >60)
GFR calc non Af Amer: 41 mL/min — ABNORMAL LOW (ref >60)
Glucose, Bld: 179 mg/dL — ABNORMAL HIGH (ref 70–99)
Potassium: 4.5 mmol/L (ref 3.5–5.1)
Sodium: 138 mmol/L (ref 135–145)

## 2019-09-20 LAB — GLUCOSE, CAPILLARY
Glucose-Capillary: 219 mg/dL — ABNORMAL HIGH (ref 70–99)
Glucose-Capillary: 194 mg/dL — ABNORMAL HIGH (ref 70–99)
Glucose-Capillary: 168 mg/dL — ABNORMAL HIGH (ref 70–99)
Glucose-Capillary: 257 mg/dL — ABNORMAL HIGH (ref 70–99)
Glucose-Capillary: 231 mg/dL — ABNORMAL HIGH (ref 70–99)

## 2019-09-20 LAB — SARS CORONAVIRUS 2 (TAT 6-24 HRS): SARS Coronavirus 2: NEGATIVE

## 2019-09-20 MED ORDER — DICLOFENAC SODIUM 1 % EX GEL
2.0000 g | Freq: Four times a day (QID) | CUTANEOUS | Status: DC
  Administered 2019-09-20 – 2019-09-21 (×6): 2 g via TOPICAL
  Filled 2019-09-20: qty 100

## 2019-09-20 MED ORDER — LOSARTAN POTASSIUM 25 MG PO TABS
12.5000 mg | ORAL_TABLET | Freq: Every day | ORAL | Status: DC
  Administered 2019-09-20 – 2019-09-21 (×2): 12.5 mg via ORAL
  Filled 2019-09-20 (×2): qty 1

## 2019-09-20 MED ORDER — METOPROLOL TARTRATE 12.5 MG HALF TABLET
12.5000 mg | ORAL_TABLET | Freq: Two times a day (BID) | ORAL | Status: DC
  Administered 2019-09-20 – 2019-09-21 (×3): 12.5 mg via ORAL
  Filled 2019-09-20 (×3): qty 1

## 2019-09-20 NOTE — Progress Notes (Signed)
Physical Therapy Treatment Patient Details Name: David Burton MRN: [id number] DOB: 04-07-1946 Today's Date: 09/20/2019    History of Present Illness Pt is a 74 y/o male admitted secondary to L sided weakness. Imaging revealed R pons infarct. Pt with chronic R sided facial droop secondary to bells palsy. PMH includes DM, CVA, HTN, CKD, MI, and CAD.     PT Comments    Pt in bed upon arrival of PT, agreeable to session with focus on progressing functional ambulation and mobility today. The pt was able to demo sig improvements in ambulation, completing multiple bouts of walking (30 ft + 30 ft + 100 ft) with use of HHA of 1 for stability. The pt has a new complaint of L elbow pain, but was able to manage transfers and ambulation with improved strength and endurance. The pt will continue to benefit from skilled PT to further progress functional endurance, strength, and stability to improve safety and independence with mobility prior to d/c.      Follow Up Recommendations  SNF     Equipment Recommendations  Rolling walker with 5" wheels    Recommendations for Other Services       Precautions / Restrictions Precautions Precautions: Fall Restrictions Weight Bearing Restrictions: No    Mobility  Bed Mobility Overal bed mobility: Needs Assistance Bed Mobility: Supine to Sit     Supine to sit: Min guard Sit to supine: Min guard   General bed mobility comments: pt with use of bed rails, no assist needed to come to EOB or reposition  Transfers Overall transfer level: Needs assistance Equipment used: Rolling walker (2 wheeled);1 person hand held assist Transfers: Sit to/from Stand Sit to Stand: Min assist         General transfer comment: minA with HHA to stand, minG with RW. Pt prefered to use minA with HHA of 1 due to pain in L elbow  Ambulation/Gait Ambulation/Gait assistance: Min assist Gait Distance (Feet): 30 Feet(30 ft + 30 ft + 100 ft) Assistive device: 1 person hand  held assist Gait Pattern/deviations: Step-through pattern;Decreased dorsiflexion - left   Gait velocity interpretation: 1.31 - 2.62 ft/sec, indicative of limited community ambulator General Gait Details: Pt able to complete multiple bouts of ambulation with short seated rest. no LOB or LLE buckling noted. Pt with minA through HHA, increased lateral movements   Stairs             Wheelchair Mobility    Modified Rankin (Stroke Patients Only) Modified Rankin (Stroke Patients Only) Pre-Morbid Rankin Score: No significant disability Modified Rankin: Moderately severe disability     Balance Overall balance assessment: Needs assistance Sitting-balance support: No upper extremity supported;Feet supported Sitting balance-Leahy Scale: Good     Standing balance support: Single extremity supported;During functional activity Standing balance-Leahy Scale: Fair Standing balance comment: Reliant on at least 1 UE support and external support                            Cognition Arousal/Alertness: Awake/alert Behavior During Therapy: WFL for tasks assessed/performed Overall Cognitive Status: No family/caregiver present to determine baseline cognitive functioning                                 General Comments: A&O x 4. Pt pleasant and willing to participate in therapy. Pt able to follow 75% of simple one step commands. Cues for safety.  Exercises      General Comments        Pertinent Vitals/Pain Faces Pain Scale: Hurts little more Pain Location: L elbow Pain Descriptors / Indicators: Aching;Sore;Grimacing Pain Intervention(s): Limited activity within patient's tolerance;Repositioned;Monitored during session    Home Living                      Prior Function            PT Goals (current goals can now be found in the care plan section) Acute Rehab PT Goals Patient Stated Goal: to be more independent PT Goal Formulation: With  patient Time For Goal Achievement: 10/01/19 Potential to Achieve Goals: Good Progress towards PT goals: Progressing toward goals    Frequency    Min 3X/week      PT Plan Current plan remains appropriate    Co-evaluation              AM-PAC PT "6 Clicks" Mobility   Outcome Measure  Help needed turning from your back to your side while in a flat bed without using bedrails?: A Little Help needed moving from lying on your back to sitting on the side of a flat bed without using bedrails?: A Little Help needed moving to and from a bed to a chair (including a wheelchair)?: A Little Help needed standing up from a chair using your arms (e.g., wheelchair or bedside chair)?: A Little Help needed to walk in hospital room?: A Little Help needed climbing 3-5 steps with a railing? : A Lot 6 Click Score: 17    End of Session Equipment Utilized During Treatment: Gait belt Activity Tolerance: Patient tolerated treatment well Patient left: in chair;with chair alarm set;with call bell/phone within reach Nurse Communication: Mobility status PT Visit Diagnosis: Unsteadiness on feet (R26.81);Muscle weakness (generalized) (M62.81)     Time: 1247-1310 PT Time Calculation (min) (ACUTE ONLY): 23 min  Charges:  $Gait Training: 23-37 mins                     Karma Ganja, PT, DPT   Acute Rehabilitation Department Pager #: [phone]   Otho Bellows 09/20/2019, 1:36 PM

## 2019-09-20 NOTE — Progress Notes (Signed)
Benefits check submitted for Ozempic.

## 2019-09-20 NOTE — Progress Notes (Signed)
  Speech Language Pathology Treatment: Dysphagia  Patient Details Name: David Burton MRN: [id number] DOB: Jul 20, 1945 Today's Date: 09/20/2019 Time: 8527-7824 SLP Time Calculation (min) (ACUTE ONLY): 13 min  Assessment / Plan / Recommendation Clinical Impression  Pt shows improvements in speech intelligibility today, to the point that he thinks he has returned to his baseline. Treatment was therefore focused on dysphagia and f/u since MBS on Saturday. No overt s/s of aspiration were observed and his oral preparation seems improved. He does prefer slightly softer foods at home, so education was provided about regular vs mechanical soft textures. Pt believes that mechanical soft solids are more consistent with how he eats at home given his missing dentition. Will adjust diet to Dys 2 solids and thin liquids. Will f/u briefly for tolerance with all further speech/communication needs addressed at next level of care.   HPI HPI: Pt is a 74 y.o. male with PMH of type 2 diabetes, stroke, HLD, MI, hypertension, GERD, CKD IIIB who presented with generalized weakness. MRI of the brain: 17 mm acute/early subacute infarct within the right pons. Background mild chronic small vessel ischemic disease within the erebral white matter and pons. Tiny chronic lacunar infarct within the left cerebellum. Pt passed the [facility] on 09/16/19 but was then made NPO since he had a "choking" episode with pills on 09/17/19.      SLP Plan  Goals updated       Recommendations  Diet recommendations: Dysphagia 3 (mechanical soft);Thin liquid Liquids provided via: Cup;Straw Medication Administration: Whole meds with puree Supervision: Patient able to self feed;Intermittent supervision to cue for compensatory strategies Compensations: Slow rate;Small sips/bites;Follow solids with liquid Postural Changes and/or Swallow Maneuvers: Seated upright 90 degrees;Upright 30-60 min after meal                Oral Care Recommendations:  Oral care BID Follow up Recommendations: Skilled Nursing facility;24 hour supervision/assistance SLP Visit Diagnosis: Dysphagia, pharyngoesophageal phase (R13.14) Plan: Goals updated       GO                 Osie Bond., M.A. [facility] Services Pager [phone] Office [phone]  09/20/2019, 2:29 PM

## 2019-09-20 NOTE — TOC Benefit Eligibility Note (Signed)
Transition of Care [facility]) Benefit Eligibility Note    Patient Details  Name: David Burton MRN: [id number] Date of Birth: [birth year]   Medication/Dose: OZEMPIC   0.25 MG SQ  ONCE A WEEK FOR 4 WEEKS  Covered?: Yes  Tier: 3 Drug  Prescription Coverage Preferred Pharmacy: [facility] with Person/Company/Phone Number:: JIM  @  [employer]  MH # [phone]  Co-Pay: $9.20  Prior Approval: No  Deductible: (NO DEDUCTIBLE WITH PLAN  /   LOWE INCOME SUBSIDY)       Memory Argue Phone Number: 09/20/2019, 10:18 AM

## 2019-09-20 NOTE — TOC Progression Note (Signed)
Transition of Care [facility]) - Progression Note    Patient Details  Name: David Burton MRN: [id number] Date of Birth: 1946-03-08  Transition of Care [facility]) CM/SW [place], [place] Phone Number: 09/20/2019, 11:15 AM  Clinical Narrative:   CSW following for discharge plan. CSW spoke with patient's daughter, Gali, to discuss bed offers. [name] indicated that the family was interested in [facility], [place], and [place]. [facility] referral still pending. CSW reached out to [facility] for them to review referral, and will follow back up with daughter for choice after. CSW discussed with MD, and patient will be ready for discharge after auth received. CSW asked MD for updated COVID test. CSW to follow.    Expected Discharge Plan: [place] Barriers to Discharge: Continued Medical Work up, Ship broker  Expected Discharge Plan and Services Expected Discharge Plan: [place]       Living arrangements for the past 2 months: Apartment                                       Social Determinants of Health (SDOH) Interventions    Readmission Risk Interventions No flowsheet data found.

## 2019-09-20 NOTE — Progress Notes (Signed)
Inpatient Diabetes Program Recommendations  AACE/ADA: New Consensus Statement on Inpatient Glycemic Control (2015)  Target Ranges:  Prepandial:   less than 140 mg/dL      Peak postprandial:   less than 180 mg/dL (1-2 hours)      Critically ill patients:  140 - 180 mg/dL    Results for SPIKE, DESILETS (MRN [id number]) as of 09/20/2019 10:27  Ref. Range 09/19/2019 06:00 09/19/2019 13:19 09/19/2019 16:01 09/19/2019 21:11 09/20/2019 06:12 09/20/2019 07:26  Glucose-Capillary Latest Ref Range: 70 - 99 mg/dL 201 (H) 205 (H) 213 (H) 243 (H) 219 (H) 194 (H)    Admit with: Acute/subacute Pontine Stroke   History: DM, CVA,  Home DM Meds: Levemir 20 units QHS       Novolog 8-10 units TID       Metformin 500 mg BID  Current Orders: Levemir 20 units Daily      Novolog Sensitive Correction Scale/ SSI (0-9 units) TID AC + HS    PCP: Dr. York Ram  Per MD notes, pt stopped taking insulin at home this past week thinking the insulin may have been causing his symptoms--reports difficulty with insulin regimen due to frequency.   MD consider: -   Increasing Levemir to 25 units.  -   May add Novolog 3 units tid meal coverage if eating >50% of meals.    --Will follow patient during hospitalization--  Tama Headings RN, MSN, BC-ADM Inpatient Diabetes Coordinator Team Pager [phone] (8a-5p)

## 2019-09-20 NOTE — Progress Notes (Signed)
Spoke with Butch Penny, Echo desk receptionist, asking for David Burton's ECHO from 09/17/19 to be read.   Gerlene Fee, [facility] PGY-1

## 2019-09-20 NOTE — Progress Notes (Signed)
Family Medicine Teaching Service Daily Progress Note Intern Pager: [phone]  Patient name: David Burton Medical record number: [id number] Date of birth: 05/12/1946 Age: 74 y.o. Gender: male  Primary Care Provider: Harvie Junior, MD Consultants: Neurology Code Status: DNR   Pt Overview and Major Events to Date:  Hospital Day: 5 09/16/2019: admitted for Weakness, Dizziness, and Headache   Assessment and Plan: David Burton is a 74 y.o. male who presented w/ generalized weakness found to have acute CVA.  Past medical history significant for type 2 diabetes, HLD, MI, hypertension, GERD, CKD 3B..   CVA, Right Pontine No new neurologic changes. A&Ox4. Has left elbow pain after arm exercises with PT yesterday.  -Neuro on board, appreciate recommendations -PT/OT on board, Recs for SNF -Continue meds: plavix 75, ASA 81, rosuvastatin 40 -Start voltaren gel for elbow pain -F/u TEE read - Call cards Monday 4/19  -Patient stable for discharge, CIR pending  -SLP following, appreciate recs. Currently dysphagia 2 diet  IDDM 2  Peripheral neuropathy  A1C 10.8 (09/17/19). CBG this AM 194. No medications for neuropathy at this time -Increase levemir to 20 units daily.  -Discontinue nightly coverage  HTN, CAD (s/p PCI 01/26/19), MI  HFmrEF 40-45% EF BP 125/69. Still waiting on echocardiogram read. At home, patient's medication list includes hydralazine 50 mg TID, Imdur 24 hours 60 mg, nitro, amlodipine 5 mg. Continue to hold blood pressure medications with stable blood pressures -Continue ASA, Crestor, Plavix -Start Losartan 12.5mg  daily -Start metoprolol 12.5mg  BID   CKD IIIB Creatinine elevated on admission. No labs ordered for this morning.  -Avoid nephrotoxic medications -Discontinue ibuprofen on patient's medication list prior to discharge  HLD Lipid panel obtained for risk ratification.  Increased rosuvastatin to 40 mg -Continue 40 mg rosuvastatin  GERD Patient takes Prilosec 20 mg  at home daily.  Currently not ordered. -Can restart per patient compalint  FEN/GI:  Fluids: Dysphagia 2 per SLP recs  Access: Left PIV (day 3) VTE prophylaxis: Lovenox  Disposition: Medically stable for discharge to SNF when bed available.   Subjective:  Had left elbow pain that started yesterday after PT exercises.   Objective: Temp:  [97.8 F (36.6 C)-98.7 F (37.1 C)] 97.9 F (36.6 C) (04/19 0815) Pulse Rate:  [62-88] 67 (04/19 0815) Cardiac Rhythm: Normal sinus rhythm (04/18 2033) Resp:  [17-20] 18 (04/19 0815) BP: (111-136)/(57-89) 125/69 (04/19 0815) SpO2:  [94 %-98 %] 96 % (04/19 0815) Intake/Output      04/18 0701 - 04/19 0700 04/19 0701 - 04/20 0700   P.O.  250   Total Intake(mL/kg)  250 (2.2)   Urine (mL/kg/hr) 1350 (0.5)    Total Output 1350    Net -1350 +250            Physical Exam: General: Appears well, no acute distress. Age appropriate. Cardiac: RRR, normal heart sounds, no murmurs Respiratory: CTAB, normal effort MSK: Full ROM of left elbow, antalgic movement  Neuro: alert and oriented, Rt. Facial droop  Laboratory:   CBC: Recent Labs  Lab 09/16/19 0835  WBC 9.6  HGB 15.2  HCT 45.2  MCV 89.0  PLT 165   CMP: Recent Labs  Lab 09/16/19 0835 09/17/19 0202 09/18/19 0232  NA 136 139 137  K 4.8 4.9 4.6  CL 99 105 109  CO2 26 22 20*  GLUCOSE 390* 207* 225*  BUN 51* 58* 37*  CREATININE 2.63* 2.19* 1.74*  CALCIUM 9.6 9.3 9.0  ALBUMIN 3.8  --   --  CBG: Recent Labs  Lab 09/19/19 1319 09/19/19 1601 09/19/19 2111 09/20/19 0612 09/20/19 0726  GLUCAP 205* 213* 243* 219* 194*   Imaging/Diagnostic Tests: 09/17/19 Echo PENDING (Echo lab called @3 :38pm 09/20/19)  Gerlene Fee, DO 09/20/2019, 8:26 AM PGY-1, [place] Intern pager: [phone], text pages welcome

## 2019-09-20 NOTE — Social Work (Addendum)
4:26p CSW received telephone call from [place]. Patient insurance is authorized.    2:00p CSW followed up on insurance authorization. Authorization is still pending and SNF choice needs to be provided once a decision is made.  Criss Alvine, MSW, [employer]

## 2019-09-21 ENCOUNTER — Other Ambulatory Visit: Payer: Self-pay | Admitting: Adult Health

## 2019-09-21 ENCOUNTER — Inpatient Hospital Stay (HOSPITAL_COMMUNITY): Payer: Medicare HMO

## 2019-09-21 DIAGNOSIS — M25422 Effusion, left elbow: Secondary | ICD-10-CM

## 2019-09-21 DIAGNOSIS — I6350 Cerebral infarction due to unspecified occlusion or stenosis of unspecified cerebral artery: Secondary | ICD-10-CM | POA: Diagnosis not present

## 2019-09-21 DIAGNOSIS — R531 Weakness: Secondary | ICD-10-CM | POA: Diagnosis not present

## 2019-09-21 LAB — CBC WITH DIFFERENTIAL/PLATELET
Abs Immature Granulocytes: 0.05 10*3/uL (ref 0.00–0.07)
Basophils Absolute: 0 10*3/uL (ref 0.0–0.1)
Basophils Relative: 0 %
Eosinophils Absolute: 0.1 10*3/uL (ref 0.0–0.5)
Eosinophils Relative: 1 %
HCT: 39.4 % (ref 39.0–52.0)
Hemoglobin: 13.3 g/dL (ref 13.0–17.0)
Immature Granulocytes: 1 %
Lymphocytes Relative: 22 %
Lymphs Abs: 1.9 10*3/uL (ref 0.7–4.0)
MCH: 29.8 pg (ref 26.0–34.0)
MCHC: 33.8 g/dL (ref 30.0–36.0)
MCV: 88.1 fL (ref 80.0–100.0)
Monocytes Absolute: 1 10*3/uL (ref 0.1–1.0)
Monocytes Relative: 12 %
Neutro Abs: 5.6 10*3/uL (ref 1.7–7.7)
Neutrophils Relative %: 64 %
Platelets: 135 10*3/uL — ABNORMAL LOW (ref 150–400)
RBC: 4.47 MIL/uL (ref 4.22–5.81)
RDW: 13.1 % (ref 11.5–15.5)
WBC: 8.7 10*3/uL (ref 4.0–10.5)
nRBC: 0 % (ref 0.0–0.2)

## 2019-09-21 LAB — GLUCOSE, CAPILLARY
Glucose-Capillary: 229 mg/dL — ABNORMAL HIGH (ref 70–99)
Glucose-Capillary: 192 mg/dL — ABNORMAL HIGH (ref 70–99)
Glucose-Capillary: 156 mg/dL — ABNORMAL HIGH (ref 70–99)

## 2019-09-21 LAB — BASIC METABOLIC PANEL
Anion gap: 8 (ref 5–15)
BUN: 23 mg/dL (ref 8–23)
CO2: 21 mmol/L — ABNORMAL LOW (ref 22–32)
Calcium: 9.3 mg/dL (ref 8.9–10.3)
Chloride: 106 mmol/L (ref 98–111)
Creatinine, Ser: 1.75 mg/dL — ABNORMAL HIGH (ref 0.61–1.24)
GFR calc Af Amer: 44 mL/min — ABNORMAL LOW (ref >60)
GFR calc non Af Amer: 38 mL/min — ABNORMAL LOW (ref >60)
Glucose, Bld: 220 mg/dL — ABNORMAL HIGH (ref 70–99)
Potassium: 4.5 mmol/L (ref 3.5–5.1)
Sodium: 135 mmol/L (ref 135–145)

## 2019-09-21 MED ORDER — ASPIRIN EC 81 MG PO TBEC: 81.0000 mg | DELAYED_RELEASE_TABLET | Freq: Every day | ORAL | Status: DC

## 2019-09-21 MED ORDER — ROSUVASTATIN CALCIUM 40 MG PO TABS: 40.0000 mg | ORAL_TABLET | Freq: Every day | ORAL | 0 refills | Status: DC

## 2019-09-21 MED ORDER — METOPROLOL TARTRATE 25 MG PO TABS: 12.5000 mg | ORAL_TABLET | Freq: Two times a day (BID) | ORAL | 0 refills | Status: DC

## 2019-09-21 MED ORDER — CLOPIDOGREL BISULFATE 75 MG PO TABS: 75.0000 mg | ORAL_TABLET | Freq: Every day | ORAL | 0 refills | Status: DC

## 2019-09-21 MED ORDER — LOSARTAN POTASSIUM 25 MG PO TABS: 12.5000 mg | ORAL_TABLET | Freq: Every day | ORAL | 0 refills | Status: DC

## 2019-09-21 MED ORDER — INSULIN ASPART 100 UNIT/ML ~~LOC~~ SOLN: 0.0000 [IU] | Freq: Three times a day (TID) | SUBCUTANEOUS | 11 refills | Status: DC

## 2019-09-21 MED ORDER — HYDROCODONE-ACETAMINOPHEN 10-325 MG PO TABS: 1.0000 | ORAL_TABLET | Freq: Three times a day (TID) | ORAL | 0 refills | Status: DC | PRN

## 2019-09-21 MED ORDER — DICLOFENAC SODIUM 1 % EX GEL: 2.0000 g | Freq: Four times a day (QID) | CUTANEOUS | 0 refills | Status: DC

## 2019-09-21 MED ORDER — OXYCODONE HCL 5 MG PO TABS
5.0000 mg | ORAL_TABLET | Freq: Once | ORAL | Status: AC
  Administered 2019-09-21: 5 mg via ORAL
  Filled 2019-09-21: qty 1

## 2019-09-21 MED ORDER — INSULIN ASPART 100 UNIT/ML ~~LOC~~ SOLN: 0.0000 [IU] | Freq: Every day | SUBCUTANEOUS | 11 refills | Status: DC

## 2019-09-21 NOTE — Progress Notes (Signed)
Report called to Catalina Lunger RN at [place].;

## 2019-09-21 NOTE — Discharge Instructions (Signed)
Thank you so much for allowing [place] be apart of your care! You presented to the ER for persistent dizziness and balance issues for the past week and found to have a new pontine stroke on imaging--this is likely the cause of your symptoms. While you were here we had you evaluated for physical and occupational therapy who recommended further rehabilitation.   To prevent any more future strokes, it is important that we control your risk factors. Please continue taking Crestor (cholesterol medicine) and working towards controlling your diabetes. You will also continue your aspirin (81mg  once daily) and plavix (75mg  daily). It is HIGHLY important that you follow up with your primary care provider for diabetes management as it is currently uncontrolled. We have discharged you on a similar regimen for now. Please check your sugars in the morning (fasting before you eat), 2 hours after your largest meal, and at bedtime.   If you have worsening of your dizziness, balance issues, new onset weakness/numbness, difficulty speaking or understanding speech, or new facial droop, chest pain/shortness of breath >> Please seek medical care immediately.

## 2019-09-21 NOTE — Progress Notes (Signed)
Inpatient Diabetes Program Recommendations  AACE/ADA: New Consensus Statement on Inpatient Glycemic Control (2015)  Target Ranges:  Prepandial:   less than 140 mg/dL      Peak postprandial:   less than 180 mg/dL (1-2 hours)      Critically ill patients:  140 - 180 mg/dL    Results for David Burton, David Burton (MRN [id number]) as of 09/20/2019 10:27  Ref. Range 09/19/2019 06:00 09/19/2019 13:19 09/19/2019 16:01 09/19/2019 21:11 09/20/2019 06:12 09/20/2019 07:26  Glucose-Capillary Latest Ref Range: 70 - 99 mg/dL 201 (H) 205 (H) 213 (H) 243 (H) 219 (H) 194 (H)   Results for David Burton, David Burton (MRN [id number]) as of 09/21/2019 11:19  Ref. Range 09/20/2019 06:12 09/20/2019 07:26 09/20/2019 11:23 09/20/2019 15:44 09/20/2019 21:18 09/21/2019 06:17 09/21/2019 07:23  Glucose-Capillary Latest Ref Range: 70 - 99 mg/dL 219 (H) 194 (H) 168 (H) 257 (H) 231 (H) 229 (H) 192 (H)   Admit with: Acute/subacute Pontine Stroke   History: DM, CVA,  Home DM Meds: Levemir 20 units QHS       Novolog 8-10 units TID       Metformin 500 mg BID  Current Orders: Levemir 20 units Daily      Novolog Sensitive Correction Scale/ SSI (0-9 units) TID AC + HS    PCP: Dr. York Ram  Per MD notes, pt stopped taking insulin at home this past week thinking the insulin may have been causing his symptoms--reports difficulty with insulin regimen due to frequency.   MD consider: -   Increasing Levemir to 25 units.  -   May add Novolog 3 units tid meal coverage if eating >50% of meals.    --Will follow patient during hospitalization--  Tama Headings RN, MSN, BC-ADM Inpatient Diabetes Coordinator Team Pager [phone] (8a-5p)

## 2019-09-21 NOTE — Progress Notes (Signed)
FPTS Interim Progress Note  S: Received page that patient was reporting left elbow pain and swelling and asked to report to bedside in order to evaluate the patient.  Upon my arrival to bedside, patient was awake and watching television.  Patient reports that he has had left elbow pain since having his IV removed from the affected arm earlier.  patient reports 10/10 pain.  Patient denies previous occurrences of this pain before.  Patient states that he can move his arm however does not demonstrate greater than approximately 20 degrees of flexion of his left elbow.  Patient denies pain in shoulder or wrist or hand.  States that pain is in 1 central location and does not move.  Patient reports that the pain is constant and feels "achy" but is quite bothersome and woke him from his sleep.  Denies feeling feverish or having cold chills or any other feelings of illness. Nursing reports that the patient has been given Tylenol twice with no relief and attempted to use heating packs to relieve pain with no improvement.  O: BP (!) 116/57 (BP Location: Right Arm)   Pulse 72   Temp 98.5 F (36.9 C) (Oral)   Resp 16   Ht 5\' 6"  (1.676 m)   Wt 111.3 kg   SpO2 (!) 82%   BMI 39.60 kg/m    Physical exam General: Male appearing stated age lying in bed supine in no acute distress Left elbow: Edema above olecranon, no redness, tenderness to palpation of area of edema, demonstrates limited range of motion of left elbow, warm to touch however patient had just removed heating pad, area does not feel more warm in comparison to the rest of his forearm.  Radial pulse intact and palpable.  Sensation intact in entire left upper extremity Right elbow: Normal range of motion, sensation intact, no edema, no redness, not warm to touch  A/P: Differential for elbow pain includes septic arthritis, however, patient is afebrile with most recent temperature 98.5 and patient denies fevers or chills however must consider as patient  states that pain began after having PIV removed earlier today.  Also consider left upper extremity DVT, no redness but does have tenderness above the joint space which will be considered less likely as I would expect this to be in the muscle and not joint space.  Also consider gout/pseudogout of left elbow.  Patient was admitted for right pontine stroke with left-sided upper and lower extremity weakness.  -Oxycodone 5 mg ordered -Left upper extremity ultrasound ordered -CBC with differential ordered  David Klein, MD 09/21/2019, 4:09 AM PGY-1, [place] Medicine Service pager [phone]

## 2019-09-21 NOTE — TOC Transition Note (Signed)
Transition of Care [facility]) - CM/SW Discharge Note   Patient Details  Name: Karman Veney MRN: [id number] Date of Birth: 11/19/1945  Transition of Care [facility]) CM/SW Contact:  Geralynn Ochs, LCSW Phone Number: 09/21/2019, 12:51 PM   Clinical Narrative:   Nurse to call report to [phone].    Final next level of care: Skilled Nursing Facility Barriers to Discharge: Barriers Resolved   Patient Goals and CMS Choice Patient states their goals for this hospitalization and ongoing recovery are:: To return home CMS Medicare.gov Compare Post Acute Care list provided to:: Patient Choice offered to / list presented to : Patient  Discharge Placement              Patient [facility] bed at: [facility] Patient to be transferred to facility by: Family car Name of family member notified: [name] Patient and family notified of of transfer: 09/21/19  Discharge Plan and Services                                     Social Determinants of Health (SDOH) Interventions     Readmission Risk Interventions No flowsheet data found.

## 2019-09-22 ENCOUNTER — Non-Acute Institutional Stay (SKILLED_NURSING_FACILITY): Payer: Medicare HMO | Admitting: Adult Health

## 2019-09-22 ENCOUNTER — Encounter: Payer: Self-pay | Admitting: Adult Health

## 2019-09-22 DIAGNOSIS — N1832 Chronic kidney disease, stage 3b: Secondary | ICD-10-CM

## 2019-09-22 DIAGNOSIS — K5901 Slow transit constipation: Secondary | ICD-10-CM

## 2019-09-22 DIAGNOSIS — I6350 Cerebral infarction due to unspecified occlusion or stenosis of unspecified cerebral artery: Secondary | ICD-10-CM | POA: Diagnosis not present

## 2019-09-22 DIAGNOSIS — M7022 Olecranon bursitis, left elbow: Secondary | ICD-10-CM

## 2019-09-22 DIAGNOSIS — I2510 Atherosclerotic heart disease of native coronary artery without angina pectoris: Secondary | ICD-10-CM | POA: Diagnosis not present

## 2019-09-22 DIAGNOSIS — E1142 Type 2 diabetes mellitus with diabetic polyneuropathy: Secondary | ICD-10-CM | POA: Diagnosis not present

## 2019-09-22 DIAGNOSIS — I119 Hypertensive heart disease without heart failure: Secondary | ICD-10-CM | POA: Diagnosis not present

## 2019-09-22 NOTE — Progress Notes (Signed)
Location:  [place] Room Number: 318-A Place of Service:  SNF (31) Provider:  Durenda Age, DNP, FNP-BC  Patient Care Team: Harvie Junior, MD as PCP - General (Specialist)  Extended Emergency Contact Information Primary Emergency Contact: Sherryl Manges,  [address] [place] Phone: [phone] Mobile Phone: [phone] Relation: Daughter  Code Status:  Full Code  Goals of care: Advanced Directive information Advanced Directives 09/16/2019  Does Patient Have a Medical Advance Directive? No  Copy of Healthcare Power of Attorney in Chart? -  Would patient like information on creating a medical advance directive? No - Patient declined     Chief Complaint  Patient presents with  . Acute Visit    Hospital followup, status post admission at [facility] 4/15-4/20/21 for a right pontine stroke.    HPI:  Pt is a 74 y.o. male who was admitted to [place] on 09/21/19 post hospitalization 09/16/19 to 09/21/19 for a right pontine stroke.  He has a PMH of type 2 diabetes mellitus, hyperlipidemia, MI, hypertension, GERD and chronic kidney disease stage IIIb.  He presented in the hospital with left-sided weakness and difficulty walking.  Head CT showed findings of hypoattenuation in the area of his right pons.  MRI confirmed an acute/subacute infarct.  Neurology was consulted and recommended aspirin 81 mg and Plavix 75 mg for 4 months and then Plavix 75 mg alone.  Patient underwent MRA that showed no significant stenosis.  Bilateral carotid ultrasound 1 to 39% stenosis in bilateral carotids.  Speech-language pathology recommended dysphagia diet.  He underwent swallow study that showed mild aspiration risk.  Patient was admitted with elevated blood sugar of 319.  He was reported to have not taken Levemir at home for close to 1 month.  He was started on 10 units of Levemir and titrated to 20 units at bedtime.   Diabetes coordinator was consulted and recommended Trulicity to help with blood glucose.  He presented to the hospital with increasing left elbow discomfort.  US imaging showed findings suggest complex olecranon bursitis, hemorrhagic/posttraumatic versus infection.  Thought to be due to overuse.  He was seen in the room today and complained of constipation.   Past Medical History:  Diagnosis Date  . Bell palsy   . CKD (chronic kidney disease), stage III 12/16/2018  . Coronary artery disease involving native coronary artery of native heart with unstable angina pectoris ([place]) 12/16/2018  . Diabetes mellitus   . Diabetes mellitus without complication ([place])   . GERD (gastroesophageal reflux disease)   . Hypertension   . Hypertension with heart disease 12/16/2018  . Myocardial infarction ([place])   . Pure hypercholesterolemia 12/16/2018  . Stroke ([place])   . Type 2 diabetes, controlled, with peripheral neuropathy ([place]) 12/16/2018   Past Surgical History:  Procedure Laterality Date  . CARDIAC SURGERY    . COLONOSCOPY WITH PROPOFOL N/A 01/02/2018   Procedure: COLONOSCOPY WITH PROPOFOL;  Surgeon: Carol Ada, MD;  Location: [facility];  Service: Endoscopy;  Laterality: N/A;  . CORONARY STENT INTERVENTION N/A 01/26/2019   Procedure: CORONARY STENT INTERVENTION;  Surgeon: Adrian Prows, MD;  Location: [place] [facility] LAB;  Service: Cardiovascular;  Laterality: N/A;  . LEFT HEART CATH AND CORS/GRAFTS ANGIOGRAPHY N/A 01/05/2019   Procedure: LEFT HEART CATH AND CORS/GRAFTS ANGIOGRAPHY;  Surgeon: Adrian Prows, MD;  Location: [place] CV LAB;  Service: Cardiovascular;  Laterality: N/A;  . POLYPECTOMY  01/02/2018  Procedure: POLYPECTOMY;  Surgeon: Carol Ada, MD;  Location: [facility];  Service: Endoscopy;;  . stents      No Known Allergies  Outpatient Encounter Medications as of 09/22/2019  Medication Sig  . aspirin EC 81 MG tablet Take 1 tablet (81 mg total) by mouth daily.  . bisacodyl (DULCOLAX) 10  MG suppository Place 10 mg rectally daily as needed for moderate constipation.  . clopidogrel (PLAVIX) 75 MG tablet Take 1 tablet (75 mg total) by mouth daily.  . diclofenac Sodium (VOLTAREN) 1 % GEL Apply 2 g topically 4 (four) times daily.  Marland Kitchen HYDROcodone-acetaminophen (NORCO) 10-325 MG tablet Take 1 tablet by mouth 3 (three) times daily as needed for up to 30 doses for moderate pain.  Marland Kitchen insulin aspart (NOVOLOG) 100 UNIT/ML injection Inject 0-9 Units into the skin 3 (three) times daily with meals.  . insulin aspart (NOVOLOG) 100 UNIT/ML injection Inject 0-5 Units into the skin at bedtime.  . insulin detemir (LEVEMIR) 100 UNIT/ML injection Inject 20 Units into the skin at bedtime.   Marland Kitchen losartan (COZAAR) 25 MG tablet Take 0.5 tablets (12.5 mg total) by mouth daily.  . magnesium hydroxide (MILK OF MAGNESIA) 400 MG/5ML suspension Take 30 mLs by mouth daily as needed for mild constipation.  . metoprolol tartrate (LOPRESSOR) 25 MG tablet Take 0.5 tablets (12.5 mg total) by mouth 2 (two) times daily.  . nitroGLYCERIN (NITROSTAT) 0.4 MG SL tablet Place 1 tablet (0.4 mg total) under the tongue every 5 (five) minutes as needed for chest pain.  Marland Kitchen omeprazole (PRILOSEC) 20 MG capsule Take 1 capsule (20 mg total) by mouth daily.  . rosuvastatin (CRESTOR) 40 MG tablet Take 1 tablet (40 mg total) by mouth daily at 6 PM.  . SODIUM PHOSPHATES RE Place 1 each rectally daily as needed (Constipation not relieved by Bisacodyl).   No facility-administered encounter medications on file as of 09/22/2019.    Review of Systems  GENERAL: No change in appetite, no fatigue, no weight changes, no fever, chills or weakness MOUTH and THROAT: Denies oral discomfort, gingival pain or bleeding RESPIRATORY: no cough, SOB, DOE, wheezing, hemoptysis CARDIAC: No chest pain, edema or palpitations GI: + Constipation GU: Denies dysuria, frequency, hematuria, incontinence, or discharge NEUROLOGICAL: Denies dizziness, syncope,  numbness, or headache PSYCHIATRIC: Denies feelings of depression or anxiety. No report of hallucinations, insomnia, paranoia, or agitation   Immunization History  Administered Date(s) Administered  . PFIZER SARS-COV-2 Vaccination 08/13/2019, 09/06/2019   Pertinent  Health Maintenance Due  Topic Date Due  . FOOT EXAM  Never done  . OPHTHALMOLOGY EXAM  Never done  . PNA vac Low Risk Adult (1 of 2 - PCV13) Never done  . INFLUENZA VACCINE  01/02/2020  . HEMOGLOBIN A1C  03/18/2020  . COLONOSCOPY  01/03/2028    Vitals:   09/22/19 0901  BP: 106/65  Pulse: 77  Resp: 18  Temp: 97.7 F (36.5 C)  TempSrc: Oral  SpO2: 94%  Weight: 245 lb (111.1 kg)  Height: 5\' 6"  (1.676 m)   Body mass index is 39.54 kg/m.  Physical Exam  GENERAL APPEARANCE: Well nourished. In no acute distress.  Morbidly obese SKIN:  Skin is warm and dry.  MOUTH and THROAT: Lips are without lesions. Oral mucosa is moist and without lesions. Tongue is normal in shape, size, and color and without lesions RESPIRATORY: Breathing is even & unlabored, BS CTAB CARDIAC: RRR, no murmur,no extra heart sounds, no edema GI: Abdomen soft, normal BS, no masses, no tenderness  EXTREMITIES:  Able to move X 4 extremities NEUROLOGICAL: There is no tremor. Speech is clear. Alert and oriented X 3.  Left-sided weakness. PSYCHIATRIC:  Affect and behavior are appropriate  Labs reviewed: Recent Labs    09/18/19 0232 09/20/19 1128 09/21/19 0501  NA 137 138 135  K 4.6 4.5 4.5  CL 109 104 106  CO2 20* 26 21*  GLUCOSE 225* 179* 220*  BUN 37* 24* 23  CREATININE 1.74* 1.63* 1.75*  CALCIUM 9.0 9.5 9.3   Recent Labs    09/16/19 0835  AST 17  ALT 22  ALKPHOS 87  BILITOT 1.0  PROT 7.6  ALBUMIN 3.8   Recent Labs    01/26/19 0536 09/16/19 0835 09/21/19 0501  WBC 6.7 9.6 8.7  NEUTROABS  --   --  5.6  HGB 13.5 15.2 13.3  HCT 41.9 45.2 39.4  MCV 95.4 89.0 88.1  PLT 128* 165 135*    Lab Results  Component Value Date    HGBA1C 10.8 (H) 09/17/2019   Lab Results  Component Value Date   CHOL 153 09/17/2019   HDL 25 (L) 09/17/2019   LDLCALC 74 09/17/2019   TRIG 269 (H) 09/17/2019   CHOLHDL 6.1 09/17/2019    Significant Diagnostic Results in last 30 days:  CT Head Wo Contrast  Result Date: 09/16/2019 CLINICAL DATA:  Dizziness, nonspecific EXAM: CT HEAD WITHOUT CONTRAST TECHNIQUE: Contiguous axial images were obtained from the base of the skull through the vertex without intravenous contrast. COMPARISON:  MRI 07/10/2011 (images only) FINDINGS: Brain: There is a focal region of hypoattenuation in the right pons (2/10) given the patient's symptom onset timing, a subacute infarct is not excluded. Additional hypoattenuation centrally in the medulla has an appearance more compatible with streak artifact from the skull base. No other areas concerning for acute infarction. No hyperdense hemorrhage. No extra-axial collection, mass effect or midline shift. No frank hydrocephaly. Patchy areas of white matter hypoattenuation are most compatible with chronic microvascular angiopathy. Symmetric prominence of the ventricles, cisterns and sulci compatible with parenchymal volume loss. Vascular: Atherosclerotic calcification of the carotid siphons and intradural vertebral arteries. No hyperdense vessel. Skull: No calvarial fracture or suspicious osseous lesion. No scalp swelling or hematoma. Scattered benign-appearing dermal calcifications are noted. Sinuses/Orbits: Mild mural thickening in the ethmoid air cells. Remaining paranasal sinuses are predominantly clear. Slightly hypo pneumatized appearance of the mastoid air cells with a trace right mastoid effusion. Extensive debris noted in the bilateral external auditory canals including complete impaction of the right external auditory canal. Other: None IMPRESSION: 1. Focal region of hypoattenuation in the right pons (2/10) given the patient's symptom onset timing, finding raises suspicion  for a subacute infarct additional hypoattenuation centrally in the medulla has an appearance more compatible with streak artifact from the skull base. Consider further evaluation with MRI. 2. Chronic microvascular angiopathy and parenchymal volume loss. 3. Extensive debris in the bilateral external auditory canals including complete impaction of the right external auditory canal. Correlate with visual inspection for cerumen impaction. 4. Trace right mastoid effusion. Electronically Signed   By: Lovena Le M.D.   On: 09/16/2019 15:25   MR ANGIO HEAD WO CONTRAST  Result Date: 09/16/2019 CLINICAL DATA:  Ataxia EXAM: MRA HEAD WITHOUT CONTRAST TECHNIQUE: Angiographic images of the Circle of Willis were obtained using MRA technique without intravenous contrast. COMPARISON:  Brain MRI same day FINDINGS: POSTERIOR CIRCULATION: --Vertebral arteries: Normal V4 segments. --Posterior inferior cerebellar arteries (PICA): Patent origins from the vertebral arteries. --Anterior inferior  cerebellar arteries (AICA): Patent origins from the basilar artery. --Basilar artery: Normal. --Superior cerebellar arteries: Normal. --Posterior cerebral arteries: Normal. The left PCA is predominantly supplied by the posterior communicating artery. ANTERIOR CIRCULATION: --Intracranial internal carotid arteries: Normal. --Anterior cerebral arteries (ACA): Normal. Both A1 segments are present. Patent anterior communicating artery (a-comm). --Middle cerebral arteries (MCA): Normal. IMPRESSION: Normal brain MRA. Electronically Signed   By: Ulyses Jarred M.D.   On: 09/16/2019 22:55   MR BRAIN WO CONTRAST  Result Date: 09/16/2019 CLINICAL DATA:  Neuro deficit, acute, stroke suspected. Additional history provided: Difficulties with balance and walking for the past week EXAM: MRI HEAD WITHOUT CONTRAST TECHNIQUE: Multiplanar, multiecho pulse sequences of the brain and surrounding structures were obtained without intravenous contrast. COMPARISON:   Non-contrast head CT performed earlier the same day 09/16/2019 FINDINGS: Brain: Mild intermittent motion degradation. There is a 17 x 17 mm focus of restricted diffusion within the right pons consistent with acute/early subacute infarct. Corresponding T2/FLAIR hyperintensity at this site. There is no evidence of intracranial mass. No midline shift or extra-axial fluid collection. No chronic intracranial blood products. Mild scattered T2/FLAIR hyperintensity within the cerebral white matter and pons is nonspecific, but consistent with chronic small vessel ischemic disease. Small chronic lacunar infarct within the left cerebellar hemisphere. Mild generalized parenchymal atrophy. Vascular: Flow voids maintained within the proximal large arterial vessels. Skull and upper cervical spine: No focal marrow lesion. Sinuses/Orbits: Visualized orbits demonstrate no acute abnormality. Mild paranasal sinus mucosal thickening. Trace fluid within left mastoid air cells. IMPRESSION: 1. Mildly motion degraded exam. 2. Confirmed 17 mm acute/early subacute infarct within the right pons. 3. Background mild chronic small vessel ischemic disease within the cerebral white matter and pons. Tiny chronic lacunar infarct within the left cerebellum. 4. Mild generalized parenchymal atrophy. 5. Mild paranasal sinus mucosal thickening. Trace left mastoid effusion. Electronically Signed   By: Kellie Simmering DO   On: 09/16/2019 18:14   DG Swallowing Func-Speech Pathology  Result Date: 09/20/2019 Objective Swallowing Evaluation: Type of Study: MBS-Modified Barium Swallow Study  Patient Details Name: Desman Polak MRN: [id number] Date of Birth: 1946/01/06 Today's Date: 09/20/2019 Time: SLP Start Time (ACUTE ONLY): 1418 -SLP Stop Time (ACUTE ONLY): 1436 SLP Time Calculation (min) (ACUTE ONLY): 18 min Past Medical History: Past Medical History: Diagnosis Date . Bell palsy  . CKD (chronic kidney disease), stage III 12/16/2018 . Coronary artery disease  involving native coronary artery of native heart with unstable angina pectoris ([place]) 12/16/2018 . Diabetes mellitus  . Diabetes mellitus without complication ([place])  . GERD (gastroesophageal reflux disease)  . Hypertension  . Hypertension with heart disease 12/16/2018 . Myocardial infarction ([place])  . Pure hypercholesterolemia 12/16/2018 . Stroke ([place])  . Type 2 diabetes, controlled, with peripheral neuropathy ([place]) 12/16/2018 Past Surgical History: Past Surgical History: Procedure Laterality Date . CARDIAC SURGERY   . COLONOSCOPY WITH PROPOFOL N/A 01/02/2018  Procedure: COLONOSCOPY WITH PROPOFOL;  Surgeon: Carol Ada, MD;  [facility];  Service: Endoscopy;  Laterality: N/A; . CORONARY STENT INTERVENTION N/A 01/26/2019  Procedure: CORONARY STENT INTERVENTION;  Surgeon: Adrian Prows, MD;  Location: [place] [facility];  Service: Cardiovascular;  Laterality: N/A; . LEFT HEART CATH AND CORS/GRAFTS ANGIOGRAPHY N/A 01/05/2019  Procedure: LEFT HEART CATH AND CORS/GRAFTS ANGIOGRAPHY;  Surgeon: Adrian Prows, MD;  Location: [place] [facility];  Service: Cardiovascular;  Laterality: N/A; . POLYPECTOMY  01/02/2018  Procedure: POLYPECTOMY;  Surgeon: Carol Ada, MD;  [facility];  Service: Endoscopy;; . stents   HPI:  Pt is a 74 y.o. male with PMH of type 2 diabetes, stroke, HLD, MI, hypertension, GERD, CKD IIIB who presented with generalized weakness. MRI of the brain: 17 mm acute/early subacute infarct within the right pons. Background mild chronic small vessel ischemic disease within the erebral white matter and pons. Tiny chronic lacunar infarct within the left cerebellum. Pt passed the [place] on 09/16/19 but was then made NPO since he had a "choking" episode with pills on 09/17/19.  Subjective: pt says he coughs occasionally at the end of meals, but does not cough much during eating Assessment / Plan / Recommendation CHL IP CLINICAL IMPRESSIONS 09/18/2019 Clinical Impression Pt presents with a mild oropharyngeal  dysphagia with suspected esophageal component (note that imaging was not available for review at the time of documentation). He has mildly prolonged oral preparation that may be related to lack of dentition, but he also has mild lingual residue that required Min cues for a second dry swallow. Pharyngeally he had good airway protection despite thin liquids at times reaching the pyriform sinuses prior to the swallow. He has suspected osteophytes around the level of the UES that may reduce UES relaxation as there was mild thin liquid residue in the pyriform sinuses in particular. Suspect that overall this residue of thin liquids > solids may be related to esophageal component as he also had incomplete clearance of the barium tablet despite multiple liquid washes and dry swallows (MD not present for confirmation). Recommend continuing Dys 2 diet and thin liquids. Could consider esophageal w/u as well.  SLP Visit Diagnosis Dysphagia, pharyngoesophageal phase (R13.14) Attention and concentration deficit following -- Frontal lobe and executive function deficit following -- Impact on safety and function Mild aspiration risk   CHL IP TREATMENT RECOMMENDATION 09/18/2019 Treatment Recommendations Therapy as outlined in treatment plan below   Prognosis 09/18/2019 Prognosis for Safe Diet Advancement Good Barriers to Reach Goals -- Barriers/Prognosis Comment -- CHL IP DIET RECOMMENDATION 09/18/2019 SLP Diet Recommendations Dysphagia 2 (Fine chop) solids;Thin liquid Liquid Administration via Cup;Straw Medication Administration Whole meds with liquid Compensations Slow rate;Small sips/bites;Follow solids with liquid Postural Changes Remain semi-upright after after feeds/meals (Comment);Seated upright at 90 degrees   CHL IP OTHER RECOMMENDATIONS 09/18/2019 Recommended Consults Consider esophageal assessment Oral Care Recommendations Oral care BID Other Recommendations --   CHL IP FOLLOW UP RECOMMENDATIONS 09/18/2019 Follow up  Recommendations Skilled Nursing facility;24 hour supervision/assistance   CHL IP FREQUENCY AND DURATION 09/18/2019 Speech Therapy Frequency (ACUTE ONLY) min 2x/week Treatment Duration 2 weeks      CHL IP ORAL PHASE 09/18/2019 Oral Phase Impaired Oral - Pudding Teaspoon -- Oral - Pudding Cup -- Oral - Honey Teaspoon -- Oral - Honey Cup -- Oral - Nectar Teaspoon -- Oral - Nectar Cup -- Oral - Nectar Straw -- Oral - Thin Teaspoon -- Oral - Thin Cup Lingual/palatal residue Oral - Thin Straw Lingual/palatal residue Oral - Puree Lingual/palatal residue Oral - Mech Soft Lingual/palatal residue Oral - Regular -- Oral - Multi-Consistency -- Oral - Pill WFL Oral Phase - Comment --  CHL IP PHARYNGEAL PHASE 09/18/2019 Pharyngeal Phase Impaired Pharyngeal- Pudding Teaspoon -- Pharyngeal -- Pharyngeal- Pudding Cup -- Pharyngeal -- Pharyngeal- Honey Teaspoon -- Pharyngeal -- Pharyngeal- Honey Cup -- Pharyngeal -- Pharyngeal- Nectar Teaspoon -- Pharyngeal -- Pharyngeal- Nectar Cup -- Pharyngeal -- Pharyngeal- Nectar Straw -- Pharyngeal -- Pharyngeal- Thin Teaspoon -- Pharyngeal -- Pharyngeal- Thin Cup Pharyngeal residue - pyriform Pharyngeal -- Pharyngeal- Thin Straw Pharyngeal residue - pyriform Pharyngeal -- Pharyngeal- Puree WFL  Pharyngeal -- Pharyngeal- Mechanical Soft WFL Pharyngeal -- Pharyngeal- Regular -- Pharyngeal -- Pharyngeal- Multi-consistency -- Pharyngeal -- Pharyngeal- Pill WFL Pharyngeal -- Pharyngeal Comment --  CHL IP CERVICAL ESOPHAGEAL PHASE 09/18/2019 Cervical Esophageal Phase Impaired Pudding Teaspoon -- Pudding Cup -- Honey Teaspoon -- Honey Cup -- Nectar Teaspoon -- Nectar Cup -- Nectar Straw -- Thin Teaspoon -- Thin Cup Reduced cricopharyngeal relaxation Thin Straw Reduced cricopharyngeal relaxation Puree Reduced cricopharyngeal relaxation Mechanical Soft Reduced cricopharyngeal relaxation Regular -- Multi-consistency -- Pill Reduced cricopharyngeal relaxation Cervical Esophageal Comment -- Osie Bond., M.A.  [facility] Pager [phone] Office [phone] 09/20/2019, 1:23 PM              ECHOCARDIOGRAM COMPLETE  Result Date: 09/20/2019    ECHOCARDIOGRAM REPORT   Patient Name:   BLESSING OZGA Date of Exam: 09/17/2019 Medical Rec #:  [id number]   Height:       66.0 in Accession #:    [id number]  Weight:       250.0 lb Date of Birth:  07-11-1945  BSA:          2.199 m Patient Age:    23 years    BP:           112/47 mmHg Patient Gender: M           HR:           68 bpm. Exam Location:  Inpatient Procedure: 2D Echo Indications:     stroke 434.91  History:         Patient has no prior history of Echocardiogram examinations.                  CAD, chronic kidney disease; Risk Factors:Hypertension,                  Dyslipidemia and Diabetes.  Sonographer:     Johny Chess RDCS Referring Phys:  [place] Phys: Adrian Prows MD IMPRESSIONS  1. Left ventricular ejection fraction, by estimation, is 55 to 60%. The left ventricle has normal function. The left ventricle has no regional wall motion abnormalities. Left ventricular diastolic parameters are consistent with Grade II diastolic dysfunction (pseudonormalization). Elevated left ventricular end-diastolic pressure.  2. Right ventricular systolic function was not well visualized. The right ventricular size is mildly enlarged. Tricuspid regurgitation signal is inadequate for assessing PA pressure.  3. Left atrial size was mildly dilated.  4. Right atrial size was mildly dilated.  5. The mitral valve is normal in structure. Trivial mitral valve regurgitation.  6. The aortic valve is tricuspid. Aortic valve regurgitation is not visualized. Mild aortic valve sclerosis is present, with no evidence of aortic valve stenosis.  7. The inferior vena cava is normal in size with greater than 50% respiratory variability, suggesting right atrial pressure of 3 mmHg. Conclusion(s)/Recommendation(s): No intracardiac source of embolism  detected on this transthoracic study. A transesophageal echocardiogram is recommended to exclude cardiac source of embolism if clinically indicated. Consider TEE if suspision for cardioembolic phenomenon suspected. FINDINGS  Left Ventricle: Left ventricular ejection fraction, by estimation, is 55 to 60%. The left ventricle has normal function. The left ventricle has no regional wall motion abnormalities. The left ventricular internal cavity size was normal in size. There is  no left ventricular hypertrophy. Left ventricular diastolic parameters are consistent with Grade II diastolic dysfunction (pseudonormalization). Elevated left ventricular end-diastolic pressure. Right Ventricle: The right ventricular size is mildly enlarged. No increase in right ventricular wall thickness.  Right ventricular systolic function was not well visualized. Tricuspid regurgitation signal is inadequate for assessing PA pressure. Left Atrium: Left atrial size was mildly dilated. Right Atrium: Right atrial size was mildly dilated. Pericardium: There is no evidence of pericardial effusion. Mitral Valve: The mitral valve is normal in structure. Mild mitral annular calcification. Trivial mitral valve regurgitation. Tricuspid Valve: The tricuspid valve is normal in structure. Tricuspid valve regurgitation is trivial. Aortic Valve: The aortic valve is tricuspid. Aortic valve regurgitation is not visualized. Mild aortic valve sclerosis is present, with no evidence of aortic valve stenosis. There is mild calcification of the aortic valve. Pulmonic Valve: The pulmonic valve was grossly normal. Pulmonic valve regurgitation is trivial. Aorta: The aortic root is normal in size and structure. Venous: The inferior vena cava is normal in size with greater than 50% respiratory variability, suggesting right atrial pressure of 3 mmHg. IAS/Shunts: No atrial level shunt detected by color flow Doppler.  LEFT VENTRICLE PLAX 2D LVIDd:         5.00 cm   Diastology LVIDs:         3.50 cm  LV e' lateral:   9.79 cm/s LV PW:         0.90 cm  LV E/e' lateral: 9.3 LV IVS:        1.00 cm  LV e' medial:    6.20 cm/s LVOT diam:     2.20 cm  LV E/e' medial:  14.6 LV SV:         75 LV SV Index:   34 LVOT Area:     3.80 cm  RIGHT VENTRICLE RV S prime:     8.59 cm/s TAPSE (M-mode): 1.2 cm LEFT ATRIUM             Index LA diam:        3.70 cm 1.68 cm/m LA Vol (A2C):   60.7 ml 27.61 ml/m LA Vol (A4C):   50.7 ml 23.06 ml/m LA Biplane Vol: 56.9 ml 25.88 ml/m  AORTIC VALVE LVOT Vmax:   97.50 cm/s LVOT Vmean:  57.600 cm/s LVOT VTI:    0.196 m  AORTA Ao Root diam: 3.00 cm MITRAL VALVE MV Area (PHT): 3.03 cm     SHUNTS MV Decel Time: 250 msec     Systemic VTI:  0.20 m MV E velocity: 90.70 cm/s   Systemic Diam: 2.20 cm MV A velocity: 104.00 cm/s MV E/A ratio:  0.87 Adrian Prows MD Electronically signed by Adrian Prows MD Signature Date/Time: 09/20/2019/8:29:30 PM    Final    [place] LT UPPER EXTREM LTD SOFT TISSUE NON VASCULAR  Result Date: 09/21/2019 CLINICAL DATA:  Pain and swelling over the left elbow. EXAM: ULTRASOUND left UPPER EXTREMITY LIMITED TECHNIQUE: Ultrasound examination of the upper extremity soft tissues was performed in the area of clinical concern. COMPARISON:  None. FINDINGS: Diffuse soft tissue swelling and complex fluid overlying the olecranon region. Findings likely due to complex olecranon bursitis. This could be hemorrhagic or infectious. IMPRESSION: Findings suggests complex olecranon bursitis, hemorrhagic/posttraumatic versus infectious. Electronically Signed   By: Marijo Sanes M.D.   On: 09/21/2019 07:27   VAS US CAROTID  Result Date: 09/17/2019 Carotid Arterial Duplex Study Indications:       CVA. Risk Factors:      Hypertension, Diabetes, coronary artery disease, prior CVA. Limitations        Today's exam was limited due to the body habitus of the  patient. Comparison Study:  no prior Performing Technologist: Abram Sander RVS  Examination  Guidelines: A complete evaluation includes B-mode imaging, spectral Doppler, color Doppler, and power Doppler as needed of all accessible portions of each vessel. Bilateral testing is considered an integral part of a complete examination. Limited examinations for reoccurring indications may be performed as noted.  Right Carotid Findings: +----------+--------+--------+--------+------------------+--------+           PSV cm/sEDV cm/sStenosisPlaque DescriptionComments +----------+--------+--------+--------+------------------+--------+ CCA Prox  83      11              heterogenous               +----------+--------+--------+--------+------------------+--------+ CCA Distal80      9               heterogenous               +----------+--------+--------+--------+------------------+--------+ ICA Prox  76      22      1-39%   heterogenous               +----------+--------+--------+--------+------------------+--------+ ICA Distal49      13                                         +----------+--------+--------+--------+------------------+--------+ ECA       62                                                 +----------+--------+--------+--------+------------------+--------+ +----------+--------+-------+--------+-------------------+           PSV cm/sEDV cmsDescribeArm Pressure (mmHG) +----------+--------+-------+--------+-------------------+ [id number]                                         +----------+--------+-------+--------+-------------------+ +---------+--------+--------+------------+ VertebralPSV cm/sEDV cm/sNot assessed +---------+--------+--------+------------+  Left Carotid Findings: +----------+--------+--------+--------+------------------+--------+           PSV cm/sEDV cm/sStenosisPlaque DescriptionComments +----------+--------+--------+--------+------------------+--------+ CCA Prox  140     17              heterogenous                +----------+--------+--------+--------+------------------+--------+ CCA Distal62      13              heterogenous               +----------+--------+--------+--------+------------------+--------+ ICA Prox  65      20      1-39%   heterogenous               +----------+--------+--------+--------+------------------+--------+ ICA Distal54      18                                         +----------+--------+--------+--------+------------------+--------+ ECA       64      5                                          +----------+--------+--------+--------+------------------+--------+ +----------+--------+--------+--------+-------------------+  PSV cm/sEDV cm/sDescribeArm Pressure (mmHG) +----------+--------+--------+--------+-------------------+ [id number]                                          +----------+--------+--------+--------+-------------------+ +---------+--------+--+--------+-+---------+ VertebralPSV cm/s35EDV cm/s8Antegrade +---------+--------+--+--------+-+---------+   Summary: Right Carotid: Velocities in the right ICA are consistent with a 1-39% stenosis. Left Carotid: Velocities in the left ICA are consistent with a 1-39% stenosis. Vertebrals: Left vertebral artery demonstrates antegrade flow. Right vertebral             artery was not visualized. *See table(s) above for measurements and observations.  Electronically signed by Antony Contras MD on 09/17/2019 at 1:46:35 PM.    Final     Assessment/Plan  1. Right pontine stroke ([place]) - Head CT showed findings of hypoattenuation in the area of his right pons.  MRI confirmed an acute/subacute infarct.  Neurology was consulted and recommended aspirin 81 mg and Plavix 75 mg for 4 months and then Plavix 75 mg alone. -Follow-up with [facility] neurology  2. Type 2 diabetes, controlled, with peripheral neuropathy (HCC) Lab Results  Component Value Date   HGBA1C 10.8 (H) 09/17/2019   -Continue  Levemir 30 units at bedtime and NovoLog sliding scale 3 times daily and at bedtime - CBGs in the 200s, will start on Trulicity 5.18 mg IM weekly -Metformin was discontinued in the hospital and may consider restarting when kidney function is more stable  3. Hypertension with heart disease - Continue Cozaar and metoprolol tartrate  4. Coronary artery disease involving native coronary artery of native heart without angina pectoris -Denies chest pain, continue PRN NTG  5. Stage 3b chronic kidney disease Lab Results  Component Value Date   CREATININE 1.75 (H) 09/21/2019   - will repeat BMP in 1 week  6. Slow transit constipation -Verbalized that he has been constipated for 7 days, will start senna S 8.6-50 mg 2 tabs twice a day and MiraLAX 17 g daily  7.  Olecranon bursitis of left elbow - Continue Voltaren 1% gel apply 2 g topically to left elbow 4 times a day and Norco 10-325 mg 1 tab daily as needed     Family/ staff Communication: Discussed plan of care with resident and charge nurse.  Labs/tests ordered: BMP in 1 week  Goals of care:   Short-term care   Durenda Age, DNP, FNP-BC [facility] [phone] (Monday-Friday 8:00 a.m. - 5:00 p.m.) [phone] (after hours)

## 2019-09-23 ENCOUNTER — Encounter: Payer: Self-pay | Admitting: Internal Medicine

## 2019-09-23 ENCOUNTER — Non-Acute Institutional Stay (SKILLED_NURSING_FACILITY): Payer: Medicare HMO | Admitting: Internal Medicine

## 2019-09-23 DIAGNOSIS — R4189 Other symptoms and signs involving cognitive functions and awareness: Secondary | ICD-10-CM

## 2019-09-23 DIAGNOSIS — E1142 Type 2 diabetes mellitus with diabetic polyneuropathy: Secondary | ICD-10-CM | POA: Diagnosis not present

## 2019-09-23 DIAGNOSIS — M25422 Effusion, left elbow: Secondary | ICD-10-CM

## 2019-09-23 DIAGNOSIS — I5189 Other ill-defined heart diseases: Secondary | ICD-10-CM | POA: Diagnosis not present

## 2019-09-23 DIAGNOSIS — I6350 Cerebral infarction due to unspecified occlusion or stenosis of unspecified cerebral artery: Secondary | ICD-10-CM | POA: Diagnosis not present

## 2019-09-23 DIAGNOSIS — R29818 Other symptoms and signs involving the nervous system: Secondary | ICD-10-CM | POA: Insufficient documentation

## 2019-09-23 DIAGNOSIS — N1832 Chronic kidney disease, stage 3b: Secondary | ICD-10-CM

## 2019-09-23 NOTE — Assessment & Plan Note (Addendum)
Creatinine 1.75 and GFR 38 at discharge. Metformin held until stable renal function confirmed.

## 2019-09-23 NOTE — Assessment & Plan Note (Signed)
Focus on treating the extensive comorbidities.

## 2019-09-23 NOTE — Patient Instructions (Signed)
See assessment and plan under each diagnosis in the problem list and acutely for this visit 

## 2019-09-23 NOTE — Assessment & Plan Note (Addendum)
Discontinue SSI at SNF and adjust the nightly Levemir based on FBS. His home regimen will need to be simplified as much as possible due to his issues with comprehension.

## 2019-09-23 NOTE — Progress Notes (Signed)
NURSING HOME LOCATION:  [facility] ROOM NUMBER:  [id number]  CODE STATUS:  Full Code  PCP:  Harvie Junior, MD  [place] [id number]   This is a comprehensive admission note to [facility] performed on this date less than 30 days from date of admission. Included are preadmission medical/surgical history; reconciled medication list; family history; social history and comprehensive review of systems.  Corrections and additions to the records were documented. Comprehensive physical exam was also performed. Additionally a clinical summary was entered for each active diagnosis pertinent to this admission in the Problem List to enhance continuity of care.  HPI: Patient was hospitalized 4/15-4/20/2021 with acute left-sided weakness and difficulty ambulating in the context of subacute right pontine stroke. Initially head CT revealed hypoattenuation in the area of the right pons.  MRI confirmed an acute/subacute infarct.  Mild chronic small vessel ischemic changes were noted within the cerebral white matter and pons.  Tiny chronic lacunar infarct was noted in the left cerebellum.  Neurology recommended 81 mg of aspirin and Plavix 75 mg for 4 months and then Plavix 75 mg alone. MRA revealed no significant stenosis.  Carotid ultrasound revealed 1-39% stenosis bilaterally.  Right vertebral artery could not be visualized. Echo revealed EF of 55-60%.  Grade 2 diastolic dysfunction was present with elevated left ventricular end-diastolic pressure.  Mild LAE and RAE was present. Swallowing study revealed mild aspiration risk.Speech therapy recommended dysphagia 2 diet.   At the time of admission glucose was 390.  Levemir had been prescribed but alledgedly the patient had been noncompliant for almost a month prior to admission.  Levemir 10 units was initiated with titration to previously prescribed home dose.  Trulicity was to be initiated as an outpatient to improve glucose  control as patient expressed difficulty following his diabetic regimen.  Metformin was not continued at discharge due to CKD. Course was complicated by left elbow olecranon bursitis.  Ultrasound imaging suggested complex olecranon bursitis, hemorrhagic/posttraumatic versus infectious in etiology.  This was treated with analgesics and Voltaren gel with subjective improvement.  Past medical and surgical history: Includes type 2 diabetes, dyslipidemia, history of MI, essential hypertension, GERD, and CKD 3B. Surgical history includes coronary stenting and colon polypectomy.  Social history: Social alcohol user, former smoker.  Family history: Reviewed   Review of systems: Veracity is in question based on his description of compliance with diabetic regimen.  He had Levemir mixed up with Metformin stating that he was taking Levemir 500 twice a day.  He states that he does not eat breakfast or lunch and snacks through the day.  He also has his largest oral intake at bedtime.  He states glucoses are rarely low but have been "9-10".  He stated that glucoses have been over 300 and he was taking up to 50 units of insulin a day, "hitting it pretty hard".  He believes he has "blue dots" in his vision when his glucose is high. He describes numbness in his feet.  He also has imbalance when standing.  He states that the left knee will buckle and he attributes this to weakness related to the stroke. He describes exertional dyspnea.  He has been told he snores; he is unaware of any apnea.  Constitutional: No fever, significant weight change, fatigue  Eyes: No redness, discharge, pain ENT/mouth: No nasal congestion, purulent discharge, earache, change in hearing, sore throat  Cardiovascular: No chest pain, palpitations, paroxysmal nocturnal dyspnea, claudication, edema  Respiratory: No  cough, sputum production, hemoptysis Gastrointestinal: No heartburn, dysphagia, abdominal pain, nausea /vomiting, rectal bleeding,  melena, change in bowels Genitourinary: No dysuria, hematuria, pyuria, incontinence, nocturia Musculoskeletal: No joint stiffness, joint swelling, weakness, pain Dermatologic: No rash, pruritus, change in appearance of skin Neurologic: No dizziness, headache, syncope, seizures Psychiatric: No significant anxiety, depression, insomnia, anorexia Endocrine: No change in hair/skin/nails, excessive thirst, excessive hunger, excessive urination  Hematologic/lymphatic: No significant bruising, lymphadenopathy, abnormal bleeding Allergy/immunology: No itchy/watery eyes, significant sneezing, urticaria, angioedema  Physical exam:  Pertinent or positive findings: He is obese.  Hair is disheveled.  He has a mustache and beard.  There is asymmetry to his smile with decreased facial movement on the right.  He also has slight ptosis on the right.  He has severe maxillary caries.  The lower teeth are plaque encrusted.  Heart sounds are distant.  Gynecomastia is present.  Abdomen is protuberant.  Pedal pulses are surprisingly good.  Strength asymmetry was subtle but possibly decreased in the left lower extremity.  He has operative scars over the mid chest anteriorly and left lower extremity medially.  General appearance: no acute distress, increased work of breathing is present.   Lymphatic: No lymphadenopathy about the head, neck, axilla. Eyes: No conjunctival inflammation or lid edema is present. There is no scleral icterus. Ears:  External ear exam shows no significant lesions or deformities.   Nose:  External nasal examination shows no deformity or inflammation. Nasal mucosa are pink and moist without lesions, exudates Neck:  No thyromegaly, masses, tenderness noted.    Heart:  No gallop, murmur, click, rub.  Lungs: Chest clear to auscultation without wheezes, rhonchi, rales, rubs. Abdomen: Bowel sounds are normal.  Abdomen is soft and nontender with no organomegaly, hernias, masses. GU: Deferred    Extremities:  No cyanosis, clubbing, edema. Neurologic exam: Balance, Rhomberg, finger to nose testing could not be completed due to clinical state Skin: Warm & dry w/o tenting. No significant lesions or rash.  See clinical summary under each active problem in the Problem List with associated updated therapeutic plan

## 2019-09-24 NOTE — Assessment & Plan Note (Signed)
Vascular dementia may explain insulin regimen compliance. PCP will receive copy of H&P

## 2019-09-24 NOTE — Assessment & Plan Note (Signed)
PT/OT @ SN[facility] Neurology F/U as scheduled

## 2019-09-24 NOTE — Assessment & Plan Note (Signed)
Minimally symptomatic @ this time

## 2019-09-29 LAB — COMPREHENSIVE METABOLIC PANEL
Calcium: 9.5 (ref 8.7–10.7)
GFR calc Af Amer: 44.25
GFR calc non Af Amer: 38.18

## 2019-09-29 LAB — BASIC METABOLIC PANEL
BUN: 28 — AB (ref 4–21)
CO2: 19 (ref 13–22)
Chloride: 101 (ref 99–108)
Creatinine: 1.7 — AB (ref 0.6–1.3)
Glucose: 218
Potassium: 4.5 (ref 3.4–5.3)
Sodium: 135 — AB (ref 137–147)

## 2019-09-30 ENCOUNTER — Non-Acute Institutional Stay (SKILLED_NURSING_FACILITY): Payer: Medicare HMO | Admitting: Adult Health

## 2019-09-30 ENCOUNTER — Other Ambulatory Visit: Payer: Self-pay | Admitting: Adult Health

## 2019-09-30 ENCOUNTER — Encounter: Payer: Self-pay | Admitting: Adult Health

## 2019-09-30 DIAGNOSIS — I129 Hypertensive chronic kidney disease with stage 1 through stage 4 chronic kidney disease, or unspecified chronic kidney disease: Secondary | ICD-10-CM

## 2019-09-30 DIAGNOSIS — M7022 Olecranon bursitis, left elbow: Secondary | ICD-10-CM

## 2019-09-30 DIAGNOSIS — I2510 Atherosclerotic heart disease of native coronary artery without angina pectoris: Secondary | ICD-10-CM | POA: Diagnosis not present

## 2019-09-30 DIAGNOSIS — N183 Chronic kidney disease, stage 3 unspecified: Secondary | ICD-10-CM

## 2019-09-30 DIAGNOSIS — K219 Gastro-esophageal reflux disease without esophagitis: Secondary | ICD-10-CM

## 2019-09-30 DIAGNOSIS — Z8673 Personal history of transient ischemic attack (TIA), and cerebral infarction without residual deficits: Secondary | ICD-10-CM

## 2019-09-30 DIAGNOSIS — E1142 Type 2 diabetes mellitus with diabetic polyneuropathy: Secondary | ICD-10-CM | POA: Diagnosis not present

## 2019-09-30 MED ORDER — METOPROLOL TARTRATE 25 MG PO TABS: 12.5000 mg | ORAL_TABLET | Freq: Two times a day (BID) | ORAL | 0 refills | Status: DC

## 2019-09-30 MED ORDER — LOSARTAN POTASSIUM 25 MG PO TABS: 12.5000 mg | ORAL_TABLET | Freq: Every day | ORAL | 0 refills | Status: DC

## 2019-09-30 MED ORDER — HYDROCODONE-ACETAMINOPHEN 10-325 MG PO TABS: 1.0000 | ORAL_TABLET | Freq: Three times a day (TID) | ORAL | 0 refills | Status: DC | PRN

## 2019-09-30 MED ORDER — DICLOFENAC SODIUM 1 % EX GEL: 2.0000 g | Freq: Four times a day (QID) | CUTANEOUS | 0 refills | Status: DC

## 2019-09-30 MED ORDER — INSULIN ASPART 100 UNIT/ML ~~LOC~~ SOLN: 0.0000 [IU] | Freq: Three times a day (TID) | SUBCUTANEOUS | 0 refills | Status: DC

## 2019-09-30 MED ORDER — CLOPIDOGREL BISULFATE 75 MG PO TABS: 75.0000 mg | ORAL_TABLET | Freq: Every day | ORAL | 0 refills | Status: DC

## 2019-09-30 MED ORDER — NITROGLYCERIN 0.4 MG SL SUBL: 0.4000 mg | SUBLINGUAL_TABLET | SUBLINGUAL | 0 refills | Status: DC | PRN

## 2019-09-30 MED ORDER — TRULICITY 0.75 MG/0.5ML ~~LOC~~ SOAJ: 0.5000 mL | SUBCUTANEOUS | 0 refills | Status: DC

## 2019-09-30 MED ORDER — INSULIN DETEMIR 100 UNIT/ML ~~LOC~~ SOLN: 20.0000 [IU] | Freq: Every day | SUBCUTANEOUS | 0 refills | Status: DC

## 2019-09-30 MED ORDER — ASPIRIN EC 81 MG PO TBEC: 81.0000 mg | DELAYED_RELEASE_TABLET | Freq: Every day | ORAL | 0 refills | Status: AC

## 2019-09-30 MED ORDER — ROSUVASTATIN CALCIUM 40 MG PO TABS: 40.0000 mg | ORAL_TABLET | Freq: Every day | ORAL | 0 refills | Status: DC

## 2019-09-30 MED ORDER — OMEPRAZOLE 20 MG PO CPDR: 20.0000 mg | DELAYED_RELEASE_CAPSULE | Freq: Every day | ORAL | 0 refills | Status: DC

## 2019-09-30 NOTE — Progress Notes (Signed)
Location:  [facility] Room Number: 111-A Place of Service:  SNF (31) Provider:  Durenda Age, DNP, FNP-BC  Patient Care Team: Harvie Junior, MD as PCP - General (Specialist)  Extended Emergency Contact Information Primary Emergency Contact: Sherryl Manges, [place] [address] [place] Phone: [phone] Mobile Phone: [phone] Relation: Daughter  Code Status:  Full Code  Goals of care: Advanced Directive information Advanced Directives 09/16/2019  Does Patient Have a Medical Advance Directive? No  Copy of Healthcare Power of Attorney in Chart? -  Would patient like information on creating a medical advance directive? No - Patient declined     Chief Complaint  Patient presents with  . Discharge Note    Patient to discharge from SNF on 10/02/19    HPI:  Pt is a 74 y.o. male who is for discharge home with Home health PT, OT and Nurse for medication management.  He was admitted to [facility] on 09/21/19 post hospitalization 09/16/19 to 09/21/19 for a right pontine stroke. He has a PMH of type 2 diabetes mellitus, hyperlipidemia, MI, hypertension, GERD and chronic kidney disease stage IIIb. He presented in the hospital with left-sided weakness and difficulty walking. Head CT showed findings of hypoattenuation in the area of his right pons. MRI confirmed an acute/subacute infarct. Neurology was consulted and recommended aspirin 81 mg daily and Plavix 75 mg daily for 4 months and then Plavix alone. Patient underwent MRA that showed no significant stenosis. Bilateral carotid ultrasound showed 1 to 39% stenosis. Speech language pathology recommended dysphagia diet. He underwent swallow study that showed mild aspiration risk. Patient was admitted with elevated blood sugars of 319. He reported to have not taken Levemir at home for close to 1 month. He was started on 10 units of Levemir and titrated to 20  units at bedtime. Diabetes coordinator was consulted and recommended Trulicity to help with blood glucose. He presented to the hospital with increasing left elbow discomfort. Ultrasound imaging showed findings suggest complex olecranon bursitis, hemorrhagic/postraumatic versus infection. This was thought to be due to overuse.   Trulicity was started at [facility] on 09/23/19.  Patient was admitted to this facility for short-term rehabilitation after the patient's recent hospitalization.  Patient has completed SNF rehabilitation and therapy has cleared the patient for discharge.   Past Medical History:  Diagnosis Date  . Bell palsy   . CKD (chronic kidney disease), stage III 12/16/2018  . Coronary artery disease involving native coronary artery of native heart with unstable angina pectoris ([place]) 12/16/2018  . GERD (gastroesophageal reflux disease)   . Hypertension   . Hypertension with heart disease 12/16/2018  . Myocardial infarction ([place])   . Pure hypercholesterolemia 12/16/2018  . Stroke ([place])   . Type 2 diabetes, controlled, with peripheral neuropathy ([facility]) 12/16/2018   Past Surgical History:  Procedure Laterality Date  . CARDIAC SURGERY    . COLONOSCOPY WITH PROPOFOL N/A 01/02/2018   Procedure: COLONOSCOPY WITH PROPOFOL;  Surgeon: Carol Ada, MD;  Location: [facility];  Service: Endoscopy;  Laterality: N/A;  . CORONARY STENT INTERVENTION N/A 01/26/2019   Procedure: CORONARY STENT INTERVENTION;  Surgeon: Adrian Prows, MD;  Location: [facility];  Service: Cardiovascular;  Laterality: N/A;  . LEFT HEART CATH AND CORS/GRAFTS ANGIOGRAPHY N/A 01/05/2019   Procedure: LEFT HEART CATH AND CORS/GRAFTS ANGIOGRAPHY;  Surgeon: Adrian Prows, MD;  Location: [facility];  Service:  Cardiovascular;  Laterality: N/A;  . POLYPECTOMY  01/02/2018   Procedure: POLYPECTOMY;  Surgeon: Carol Ada, MD;  Location: [facility];  Service: Endoscopy;;  . stents      No Known  Allergies  Outpatient Encounter Medications as of 09/30/2019  Medication Sig  . aspirin EC 81 MG tablet Take 1 tablet (81 mg total) by mouth daily.  . bisacodyl (DULCOLAX) 10 MG suppository Place 10 mg rectally daily as needed for moderate constipation.  . clopidogrel (PLAVIX) 75 MG tablet Take 1 tablet (75 mg total) by mouth daily.  . diclofenac Sodium (VOLTAREN) 1 % GEL Apply 2 g topically 4 (four) times daily.  . Dulaglutide (TRULICITY) 6.59 DJ/5.7SV SOPN Inject into the skin once a week.   Marland Kitchen HYDROcodone-acetaminophen (NORCO) 10-325 MG tablet Take 1 tablet by mouth 3 (three) times daily as needed for up to 30 doses for moderate pain.  Marland Kitchen insulin aspart (NOVOLOG) 100 UNIT/ML injection Inject 0-9 Units into the skin 3 (three) times daily with meals.  . insulin detemir (LEVEMIR) 100 UNIT/ML injection Inject 20 Units into the skin at bedtime.   Marland Kitchen losartan (COZAAR) 25 MG tablet Take 0.5 tablets (12.5 mg total) by mouth daily.  . magnesium hydroxide (MILK OF MAGNESIA) 400 MG/5ML suspension Take 30 mLs by mouth daily as needed for mild constipation.  . metoprolol tartrate (LOPRESSOR) 25 MG tablet Take 0.5 tablets (12.5 mg total) by mouth 2 (two) times daily.  . nitroGLYCERIN (NITROSTAT) 0.4 MG SL tablet Place 1 tablet (0.4 mg total) under the tongue every 5 (five) minutes as needed for chest pain.  Marland Kitchen omeprazole (PRILOSEC) 20 MG capsule Take 1 capsule (20 mg total) by mouth daily.  . polyethylene glycol (MIRALAX / GLYCOLAX) 17 g packet Take 17 g by mouth daily.  . rosuvastatin (CRESTOR) 40 MG tablet Take 1 tablet (40 mg total) by mouth daily at 6 PM.  . senna (SENOKOT) 8.6 MG TABS tablet Take 1 tablet by mouth daily.  . SODIUM PHOSPHATES RE Place 1 each rectally daily as needed (Constipation not relieved by Bisacodyl).  . [DISCONTINUED] insulin aspart (NOVOLOG) 100 UNIT/ML injection Inject 0-5 Units into the skin at bedtime.   No facility-administered encounter medications on file as of 09/30/2019.     Review of Systems  GENERAL: No change in appetite, no fatigue, no weight changes, no fever, chills or weakness MOUTH and THROAT: Denies oral discomfort, gingival pain or bleeding RESPIRATORY: no cough, SOB, DOE, wheezing, hemoptysis CARDIAC: No chest pain, edema or palpitations GI: No abdominal pain, diarrhea, constipation, heart burn, nausea or vomiting GU: Denies dysuria, frequency, hematuria, incontinence, or discharge NEUROLOGICAL: Denies dizziness, syncope, numbness, or headache PSYCHIATRIC: Denies feelings of depression or anxiety. No report of hallucinations, insomnia, paranoia, or agitation   Immunization History  Administered Date(s) Administered  . PFIZER SARS-COV-2 Vaccination 08/13/2019, 09/06/2019   Pertinent  Health Maintenance Due  Topic Date Due  . FOOT EXAM  Never done  . OPHTHALMOLOGY EXAM  Never done  . PNA vac Low Risk Adult (1 of 2 - PCV13) Never done  . INFLUENZA VACCINE  01/02/2020  . HEMOGLOBIN A1C  03/18/2020  . COLONOSCOPY  01/03/2028   No flowsheet data found.   Vitals:   09/30/19 1417  BP: 137/72  Pulse: 83  Resp: 18  Temp: 98.4 F (36.9 C)  TempSrc: Oral  Weight: 247 lb 12.8 oz (112.4 kg)  Height: 5\' 6"  (1.676 m)   Body mass index is 40 kg/m.  Physical Exam  GENERAL  APPEARANCE: Well nourished. In no acute distress. Morbidly obese SKIN:  Skin is warm and dry.  MOUTH and THROAT: Lips are without lesions. Oral mucosa is moist and without lesions. Tongue is normal in shape, size, and color and without lesions RESPIRATORY: Breathing is even & unlabored, BS CTAB CARDIAC: RRR, no murmur,no extra heart sounds, no edema GI: Abdomen soft, normal BS, no masses, no tenderness EXTREMITIES:  Able to move X 4 extremities.  NEUROLOGICAL: There is no tremor. Speech is clear. Alert and oriented X 3. PSYCHIATRIC:  Affect and behavior are appropriate  Labs reviewed: Recent Labs    09/18/19 0232 09/18/19 0232 09/20/19 1128 09/21/19 0501  09/29/19 0000  NA 137   < > 138 135 135*  K 4.6   < > 4.5 4.5 4.5  CL 109   < > 104 106 101  CO2 20*   < > 26 21* 19  GLUCOSE 225*  --  179* 220*  --   BUN 37*   < > 24* 23 28*  CREATININE 1.74*   < > 1.63* 1.75* 1.7*  CALCIUM 9.0   < > 9.5 9.3 9.5   < > = values in this interval not displayed.   Recent Labs    09/16/19 0835  AST 17  ALT 22  ALKPHOS 87  BILITOT 1.0  PROT 7.6  ALBUMIN 3.8   Recent Labs    01/26/19 0536 09/16/19 0835 09/21/19 0501  WBC 6.7 9.6 8.7  NEUTROABS  --   --  5.6  HGB 13.5 15.2 13.3  HCT 41.9 45.2 39.4  MCV 95.4 89.0 88.1  PLT 128* 165 135*   No results found for: TSH Lab Results  Component Value Date   HGBA1C 10.8 (H) 09/17/2019   Lab Results  Component Value Date   CHOL 153 09/17/2019   HDL 25 (L) 09/17/2019   LDLCALC 74 09/17/2019   TRIG 269 (H) 09/17/2019   CHOLHDL 6.1 09/17/2019    Significant Diagnostic Results in last 30 days:  CT Head Wo Contrast  Result Date: 09/16/2019 CLINICAL DATA:  Dizziness, nonspecific EXAM: CT HEAD WITHOUT CONTRAST TECHNIQUE: Contiguous axial images were obtained from the base of the skull through the vertex without intravenous contrast. COMPARISON:  MRI 07/10/2011 (images only) FINDINGS: Brain: There is a focal region of hypoattenuation in the right pons (2/10) given the patient's symptom onset timing, a subacute infarct is not excluded. Additional hypoattenuation centrally in the medulla has an appearance more compatible with streak artifact from the skull base. No other areas concerning for acute infarction. No hyperdense hemorrhage. No extra-axial collection, mass effect or midline shift. No frank hydrocephaly. Patchy areas of white matter hypoattenuation are most compatible with chronic microvascular angiopathy. Symmetric prominence of the ventricles, cisterns and sulci compatible with parenchymal volume loss. Vascular: Atherosclerotic calcification of the carotid siphons and intradural vertebral  arteries. No hyperdense vessel. Skull: No calvarial fracture or suspicious osseous lesion. No scalp swelling or hematoma. Scattered benign-appearing dermal calcifications are noted. Sinuses/Orbits: Mild mural thickening in the ethmoid air cells. Remaining paranasal sinuses are predominantly clear. Slightly hypo pneumatized appearance of the mastoid air cells with a trace right mastoid effusion. Extensive debris noted in the bilateral external auditory canals including complete impaction of the right external auditory canal. Other: None IMPRESSION: 1. Focal region of hypoattenuation in the right pons (2/10) given the patient's symptom onset timing, finding raises suspicion for a subacute infarct additional hypoattenuation centrally in the medulla has an appearance more compatible with  streak artifact from the skull base. Consider further evaluation with MRI. 2. Chronic microvascular angiopathy and parenchymal volume loss. 3. Extensive debris in the bilateral external auditory canals including complete impaction of the right external auditory canal. Correlate with visual inspection for cerumen impaction. 4. Trace right mastoid effusion. Electronically Signed   By: Lovena Le M.D.   On: 09/16/2019 15:25   MR ANGIO HEAD WO CONTRAST  Result Date: 09/16/2019 CLINICAL DATA:  Ataxia EXAM: MRA HEAD WITHOUT CONTRAST TECHNIQUE: Angiographic images of the Circle of Willis were obtained using MRA technique without intravenous contrast. COMPARISON:  Brain MRI same day FINDINGS: POSTERIOR CIRCULATION: --Vertebral arteries: Normal V4 segments. --Posterior inferior cerebellar arteries (PICA): Patent origins from the vertebral arteries. --Anterior inferior cerebellar arteries (AICA): Patent origins from the basilar artery. --Basilar artery: Normal. --Superior cerebellar arteries: Normal. --Posterior cerebral arteries: Normal. The left PCA is predominantly supplied by the posterior communicating artery. ANTERIOR CIRCULATION:  --Intracranial internal carotid arteries: Normal. --Anterior cerebral arteries (ACA): Normal. Both A1 segments are present. Patent anterior communicating artery (a-comm). --Middle cerebral arteries (MCA): Normal. IMPRESSION: Normal brain MRA. Electronically Signed   By: Ulyses Jarred M.D.   On: 09/16/2019 22:55   MR BRAIN WO CONTRAST  Result Date: 09/16/2019 CLINICAL DATA:  Neuro deficit, acute, stroke suspected. Additional history provided: Difficulties with balance and walking for the past week EXAM: MRI HEAD WITHOUT CONTRAST TECHNIQUE: Multiplanar, multiecho pulse sequences of the brain and surrounding structures were obtained without intravenous contrast. COMPARISON:  Non-contrast head CT performed earlier the same day 09/16/2019 FINDINGS: Brain: Mild intermittent motion degradation. There is a 17 x 17 mm focus of restricted diffusion within the right pons consistent with acute/early subacute infarct. Corresponding T2/FLAIR hyperintensity at this site. There is no evidence of intracranial mass. No midline shift or extra-axial fluid collection. No chronic intracranial blood products. Mild scattered T2/FLAIR hyperintensity within the cerebral white matter and pons is nonspecific, but consistent with chronic small vessel ischemic disease. Small chronic lacunar infarct within the left cerebellar hemisphere. Mild generalized parenchymal atrophy. Vascular: Flow voids maintained within the proximal large arterial vessels. Skull and upper cervical spine: No focal marrow lesion. Sinuses/Orbits: Visualized orbits demonstrate no acute abnormality. Mild paranasal sinus mucosal thickening. Trace fluid within left mastoid air cells. IMPRESSION: 1. Mildly motion degraded exam. 2. Confirmed 17 mm acute/early subacute infarct within the right pons. 3. Background mild chronic small vessel ischemic disease within the cerebral white matter and pons. Tiny chronic lacunar infarct within the left cerebellum. 4. Mild generalized  parenchymal atrophy. 5. Mild paranasal sinus mucosal thickening. Trace left mastoid effusion. Electronically Signed   By: Kellie Simmering DO   On: 09/16/2019 18:14   DG Swallowing Func-Speech Pathology  Result Date: 09/20/2019 Objective Swallowing Evaluation: Type of Study: MBS-Modified Barium Swallow Study  Patient Details Name: Saim Almanza MRN: [id number] Date of Birth: Jun 06, 1945 Today's Date: 09/20/2019 Time: SLP Start Time (ACUTE ONLY): 1418 -SLP Stop Time (ACUTE ONLY): 1436 SLP Time Calculation (min) (ACUTE ONLY): 18 min Past Medical History: Past Medical History: Diagnosis Date . Bell palsy  . CKD (chronic kidney disease), stage III 12/16/2018 . Coronary artery disease involving native coronary artery of native heart with unstable angina pectoris ([place]) 12/16/2018 . Diabetes mellitus  . Diabetes mellitus without complication ([place])  . GERD (gastroesophageal reflux disease)  . Hypertension  . Hypertension with heart disease 12/16/2018 . Myocardial infarction ([place])  . Pure hypercholesterolemia 12/16/2018 . Stroke ([place])  . Type 2 diabetes, controlled, with peripheral neuropathy ([place]) 12/16/2018  Past Surgical History: Past Surgical History: Procedure Laterality Date . CARDIAC SURGERY   . COLONOSCOPY WITH PROPOFOL N/A 01/02/2018  Procedure: COLONOSCOPY WITH PROPOFOL;  Surgeon: Carol Ada, MD;  [facility];  Service: Endoscopy;  Laterality: N/A; . CORONARY STENT INTERVENTION N/A 01/26/2019  Procedure: CORONARY STENT INTERVENTION;  Surgeon: Adrian Prows, MD;  Location: [facility];  Service: Cardiovascular;  Laterality: N/A; . LEFT HEART CATH AND CORS/GRAFTS ANGIOGRAPHY N/A 01/05/2019  Procedure: LEFT HEART CATH AND CORS/GRAFTS ANGIOGRAPHY;  Surgeon: Adrian Prows, MD;  Location: [facility];  Service: Cardiovascular;  Laterality: N/A; . POLYPECTOMY  01/02/2018  Procedure: POLYPECTOMY;  Surgeon: Carol Ada, MD;  [facility];  Service: Endoscopy;; . stents   HPI: Pt is a 74 y.o. male with PMH of  type 2 diabetes, stroke, HLD, MI, hypertension, GERD, CKD IIIB who presented with generalized weakness. MRI of the brain: 17 mm acute/early subacute infarct within the right pons. Background mild chronic small vessel ischemic disease within the erebral white matter and pons. Tiny chronic lacunar infarct within the left cerebellum. Pt passed the [facility] on 09/16/19 but was then made NPO since he had a "choking" episode with pills on 09/17/19.  Subjective: pt says he coughs occasionally at the end of meals, but does not cough much during eating Assessment / Plan / Recommendation CHL IP CLINICAL IMPRESSIONS 09/18/2019 Clinical Impression Pt presents with a mild oropharyngeal dysphagia with suspected esophageal component (note that imaging was not available for review at the time of documentation). He has mildly prolonged oral preparation that may be related to lack of dentition, but he also has mild lingual residue that required Min cues for a second dry swallow. Pharyngeally he had good airway protection despite thin liquids at times reaching the pyriform sinuses prior to the swallow. He has suspected osteophytes around the level of the UES that may reduce UES relaxation as there was mild thin liquid residue in the pyriform sinuses in particular. Suspect that overall this residue of thin liquids > solids may be related to esophageal component as he also had incomplete clearance of the barium tablet despite multiple liquid washes and dry swallows (MD not present for confirmation). Recommend continuing Dys 2 diet and thin liquids. Could consider esophageal w/u as well.  SLP Visit Diagnosis Dysphagia, pharyngoesophageal phase (R13.14) Attention and concentration deficit following -- Frontal lobe and executive function deficit following -- Impact on safety and function Mild aspiration risk   CHL IP TREATMENT RECOMMENDATION 09/18/2019 Treatment Recommendations Therapy as outlined in treatment plan below   Prognosis 09/18/2019  Prognosis for Safe Diet Advancement Good Barriers to Reach Goals -- Barriers/Prognosis Comment -- CHL IP DIET RECOMMENDATION 09/18/2019 SLP Diet Recommendations Dysphagia 2 (Fine chop) solids;Thin liquid Liquid Administration via Cup;Straw Medication Administration Whole meds with liquid Compensations Slow rate;Small sips/bites;Follow solids with liquid Postural Changes Remain semi-upright after after feeds/meals (Comment);Seated upright at 90 degrees   CHL IP OTHER RECOMMENDATIONS 09/18/2019 Recommended Consults Consider esophageal assessment Oral Care Recommendations Oral care BID Other Recommendations --   CHL IP FOLLOW UP RECOMMENDATIONS 09/18/2019 Follow up Recommendations Skilled Nursing facility;24 hour supervision/assistance   CHL IP FREQUENCY AND DURATION 09/18/2019 Speech Therapy Frequency (ACUTE ONLY) min 2x/week Treatment Duration 2 weeks      CHL IP ORAL PHASE 09/18/2019 Oral Phase Impaired Oral - Pudding Teaspoon -- Oral - Pudding Cup -- Oral - Honey Teaspoon -- Oral - Honey Cup -- Oral - Nectar Teaspoon -- Oral - Nectar Cup -- Oral -  Nectar Straw -- Oral - Thin Teaspoon -- Oral - Thin Cup Lingual/palatal residue Oral - Thin Straw Lingual/palatal residue Oral - Puree Lingual/palatal residue Oral - Mech Soft Lingual/palatal residue Oral - Regular -- Oral - Multi-Consistency -- Oral - Pill WFL Oral Phase - Comment --  CHL IP PHARYNGEAL PHASE 09/18/2019 Pharyngeal Phase Impaired Pharyngeal- Pudding Teaspoon -- Pharyngeal -- Pharyngeal- Pudding Cup -- Pharyngeal -- Pharyngeal- Honey Teaspoon -- Pharyngeal -- Pharyngeal- Honey Cup -- Pharyngeal -- Pharyngeal- Nectar Teaspoon -- Pharyngeal -- Pharyngeal- Nectar Cup -- Pharyngeal -- Pharyngeal- Nectar Straw -- Pharyngeal -- Pharyngeal- Thin Teaspoon -- Pharyngeal -- Pharyngeal- Thin Cup Pharyngeal residue - pyriform Pharyngeal -- Pharyngeal- Thin Straw Pharyngeal residue - pyriform Pharyngeal -- Pharyngeal- Puree WFL Pharyngeal -- Pharyngeal- Mechanical Soft WFL  Pharyngeal -- Pharyngeal- Regular -- Pharyngeal -- Pharyngeal- Multi-consistency -- Pharyngeal -- Pharyngeal- Pill WFL Pharyngeal -- Pharyngeal Comment --  CHL IP CERVICAL ESOPHAGEAL PHASE 09/18/2019 Cervical Esophageal Phase Impaired Pudding Teaspoon -- Pudding Cup -- Honey Teaspoon -- Honey Cup -- Nectar Teaspoon -- Nectar Cup -- Nectar Straw -- Thin Teaspoon -- Thin Cup Reduced cricopharyngeal relaxation Thin Straw Reduced cricopharyngeal relaxation Puree Reduced cricopharyngeal relaxation Mechanical Soft Reduced cricopharyngeal relaxation Regular -- Multi-consistency -- Pill Reduced cricopharyngeal relaxation Cervical Esophageal Comment -- Osie Bond., M.A. [facility] Pager [phone] Office [phone] 09/20/2019, 1:23 PM              ECHOCARDIOGRAM COMPLETE  Result Date: 09/20/2019    ECHOCARDIOGRAM REPORT   Patient Name:   EDWARD GUTHMILLER Date of Exam: 09/17/2019 Medical Rec #:  [id number]   Height:       66.0 in Accession #:    [id number]  Weight:       250.0 lb Date of Birth:  July 10, 1945  BSA:          2.199 m Patient Age:    74 years    BP:           112/47 mmHg Patient Gender: M           HR:           68 bpm. Exam Location:  Inpatient Procedure: 2D Echo Indications:     stroke 434.91  History:         Patient has no prior history of Echocardiogram examinations.                  CAD, chronic kidney disease; Risk Factors:Hypertension,                  Dyslipidemia and Diabetes.  Sonographer:     Johny Chess RDCS Referring Phys:  [place] Phys: Adrian Prows MD IMPRESSIONS  1. Left ventricular ejection fraction, by estimation, is 55 to 60%. The left ventricle has normal function. The left ventricle has no regional wall motion abnormalities. Left ventricular diastolic parameters are consistent with Grade II diastolic dysfunction (pseudonormalization). Elevated left ventricular end-diastolic pressure.  2. Right ventricular systolic function was not well  visualized. The right ventricular size is mildly enlarged. Tricuspid regurgitation signal is inadequate for assessing PA pressure.  3. Left atrial size was mildly dilated.  4. Right atrial size was mildly dilated.  5. The mitral valve is normal in structure. Trivial mitral valve regurgitation.  6. The aortic valve is tricuspid. Aortic valve regurgitation is not visualized. Mild aortic valve sclerosis is present, with no evidence of aortic valve stenosis.  7. The inferior vena cava is normal in  size with greater than 50% respiratory variability, suggesting right atrial pressure of 3 mmHg. Conclusion(s)/Recommendation(s): No intracardiac source of embolism detected on this transthoracic study. A transesophageal echocardiogram is recommended to exclude cardiac source of embolism if clinically indicated. Consider TEE if suspision for cardioembolic phenomenon suspected. FINDINGS  Left Ventricle: Left ventricular ejection fraction, by estimation, is 55 to 60%. The left ventricle has normal function. The left ventricle has no regional wall motion abnormalities. The left ventricular internal cavity size was normal in size. There is  no left ventricular hypertrophy. Left ventricular diastolic parameters are consistent with Grade II diastolic dysfunction (pseudonormalization). Elevated left ventricular end-diastolic pressure. Right Ventricle: The right ventricular size is mildly enlarged. No increase in right ventricular wall thickness. Right ventricular systolic function was not well visualized. Tricuspid regurgitation signal is inadequate for assessing PA pressure. Left Atrium: Left atrial size was mildly dilated. Right Atrium: Right atrial size was mildly dilated. Pericardium: There is no evidence of pericardial effusion. Mitral Valve: The mitral valve is normal in structure. Mild mitral annular calcification. Trivial mitral valve regurgitation. Tricuspid Valve: The tricuspid valve is normal in structure. Tricuspid valve  regurgitation is trivial. Aortic Valve: The aortic valve is tricuspid. Aortic valve regurgitation is not visualized. Mild aortic valve sclerosis is present, with no evidence of aortic valve stenosis. There is mild calcification of the aortic valve. Pulmonic Valve: The pulmonic valve was grossly normal. Pulmonic valve regurgitation is trivial. Aorta: The aortic root is normal in size and structure. Venous: The inferior vena cava is normal in size with greater than 50% respiratory variability, suggesting right atrial pressure of 3 mmHg. IAS/Shunts: No atrial level shunt detected by color flow Doppler.  LEFT VENTRICLE PLAX 2D LVIDd:         5.00 cm  Diastology LVIDs:         3.50 cm  LV e' lateral:   9.79 cm/s LV PW:         0.90 cm  LV E/e' lateral: 9.3 LV IVS:        1.00 cm  LV e' medial:    6.20 cm/s LVOT diam:     2.20 cm  LV E/e' medial:  14.6 LV SV:         75 LV SV Index:   34 LVOT Area:     3.80 cm  RIGHT VENTRICLE RV S prime:     8.59 cm/s TAPSE (M-mode): 1.2 cm LEFT ATRIUM             Index LA diam:        3.70 cm 1.68 cm/m LA Vol (A2C):   60.7 ml 27.61 ml/m LA Vol (A4C):   50.7 ml 23.06 ml/m LA Biplane Vol: 56.9 ml 25.88 ml/m  AORTIC VALVE LVOT Vmax:   97.50 cm/s LVOT Vmean:  57.600 cm/s LVOT VTI:    0.196 m  AORTA Ao Root diam: 3.00 cm MITRAL VALVE MV Area (PHT): 3.03 cm     SHUNTS MV Decel Time: 250 msec     Systemic VTI:  0.20 m MV E velocity: 90.70 cm/s   Systemic Diam: 2.20 cm MV A velocity: 104.00 cm/s MV E/A ratio:  0.87 Adrian Prows MD Electronically signed by Adrian Prows MD Signature Date/Time: 09/20/2019/8:29:30 PM    Final    [place] LT UPPER EXTREM LTD SOFT TISSUE NON VASCULAR  Result Date: 09/21/2019 CLINICAL DATA:  Pain and swelling over the left elbow. EXAM: ULTRASOUND left UPPER EXTREMITY LIMITED TECHNIQUE: Ultrasound examination of the upper  extremity soft tissues was performed in the area of clinical concern. COMPARISON:  None. FINDINGS: Diffuse soft tissue swelling and complex fluid  overlying the olecranon region. Findings likely due to complex olecranon bursitis. This could be hemorrhagic or infectious. IMPRESSION: Findings suggests complex olecranon bursitis, hemorrhagic/posttraumatic versus infectious. Electronically Signed   By: Marijo Sanes M.D.   On: 09/21/2019 07:27   VAS US CAROTID  Result Date: 09/17/2019 Carotid Arterial Duplex Study Indications:       CVA. Risk Factors:      Hypertension, Diabetes, coronary artery disease, prior CVA. Limitations        Today's exam was limited due to the body habitus of the                    patient. Comparison Study:  no prior Performing Technologist: Abram Sander RVS  Examination Guidelines: A complete evaluation includes B-mode imaging, spectral Doppler, color Doppler, and power Doppler as needed of all accessible portions of each vessel. Bilateral testing is considered an integral part of a complete examination. Limited examinations for reoccurring indications may be performed as noted.  Right Carotid Findings: +----------+--------+--------+--------+------------------+--------+           PSV cm/sEDV cm/sStenosisPlaque DescriptionComments +----------+--------+--------+--------+------------------+--------+ CCA Prox  83      11              heterogenous               +----------+--------+--------+--------+------------------+--------+ CCA Distal80      9               heterogenous               +----------+--------+--------+--------+------------------+--------+ ICA Prox  76      22      1-39%   heterogenous               +----------+--------+--------+--------+------------------+--------+ ICA Distal49      13                                         +----------+--------+--------+--------+------------------+--------+ ECA       62                                                 +----------+--------+--------+--------+------------------+--------+ +----------+--------+-------+--------+-------------------+            PSV cm/sEDV cmsDescribeArm Pressure (mmHG) +----------+--------+-------+--------+-------------------+ [id number]                                         +----------+--------+-------+--------+-------------------+ +---------+--------+--------+------------+ VertebralPSV cm/sEDV cm/sNot assessed +---------+--------+--------+------------+  Left Carotid Findings: +----------+--------+--------+--------+------------------+--------+           PSV cm/sEDV cm/sStenosisPlaque DescriptionComments +----------+--------+--------+--------+------------------+--------+ CCA Prox  140     17              heterogenous               +----------+--------+--------+--------+------------------+--------+ CCA Distal62      13              heterogenous               +----------+--------+--------+--------+------------------+--------+ ICA Prox  65  20      1-39%   heterogenous               +----------+--------+--------+--------+------------------+--------+ ICA Distal54      18                                         +----------+--------+--------+--------+------------------+--------+ ECA       64      5                                          +----------+--------+--------+--------+------------------+--------+ +----------+--------+--------+--------+-------------------+           PSV cm/sEDV cm/sDescribeArm Pressure (mmHG) +----------+--------+--------+--------+-------------------+ [id number]                                          +----------+--------+--------+--------+-------------------+ +---------+--------+--+--------+-+---------+ VertebralPSV cm/s35EDV cm/s8Antegrade +---------+--------+--+--------+-+---------+   Summary: Right Carotid: Velocities in the right ICA are consistent with a 1-39% stenosis. Left Carotid: Velocities in the left ICA are consistent with a 1-39% stenosis. Vertebrals: Left vertebral artery demonstrates antegrade flow. Right  vertebral             artery was not visualized. *See table(s) above for measurements and observations.  Electronically signed by Antony Contras MD on 09/17/2019 at 1:46:35 PM.    Final     Assessment/Plan  1. History of stroke -Head CT showed findings of hypoattenuation in the area of his right pons.  MRI confirmed an acute/subacute infarct. -Will need to continue aspirin EC 81 mg daily till 01/26/2020 - clopidogrel (PLAVIX) 75 MG tablet; Take 1 tablet (75 mg total) by mouth daily.  Dispense: 30 tablet; Refill: 0 - rosuvastatin (CRESTOR) 40 MG tablet; Take 1 tablet (40 mg total) by mouth daily at 6 PM.  Dispense: 30 tablet; Refill: 0 - follow up with [facility] on 10/26/19 @ 2:15 PM  2. Type 2 diabetes, controlled, with peripheral neuropathy (HCC) Lab Results  Component Value Date   HGBA1C 10.8 (H) 09/17/2019   - Dulaglutide (TRULICITY) 7.10 GY/6.9SW SOPN; Inject 0.5 mLs into the skin once a week.  Dispense: 4 pen; Refill: 0 - insulin aspart (NOVOLOG) 100 UNIT/ML injection; Inject 0-9 Units into the skin 3 (three) times daily with meals.  Dispense: 10 mL; Refill: 0 - insulin detemir (LEVEMIR) 100 UNIT/ML injection; Inject 0.2 mLs (20 Units total) into the skin at bedtime.  Dispense: 10 mL; Refill: 0  3. Coronary artery disease involving native coronary artery of native heart without angina pectoris - nitroGLYCERIN (NITROSTAT) 0.4 MG SL tablet; Place 1 tablet (0.4 mg total) under the tongue every 5 (five) minutes as needed for chest pain.  Dispense: 15 tablet; Refill: 0  4. Benign hypertension with chronic kidney disease, stage III Lab Results  Component Value Date   CREATININE 1.7 (A) 09/29/2019   - losartan (COZAAR) 25 MG tablet; Take 0.5 tablets (12.5 mg total) by mouth daily.  Dispense: 30 tablet; Refill: 0 - metoprolol tartrate (LOPRESSOR) 25 MG tablet; Take 0.5 tablets (12.5 mg total) by mouth 2 (two) times daily.  Dispense: 30 tablet; Refill: 0  5. Olecranon  bursitis of left elbow - diclofenac Sodium (VOLTAREN) 1 % GEL; Apply 2 g topically 4 (  four) times daily.  Dispense: 50 g; Refill: 0 - HYDROcodone-acetaminophen (NORCO) 10-325 MG tablet; Take 1 tablet by mouth 3 (three) times daily as needed for up to 30 doses for moderate pain.  Dispense: 20 tablet; Refill: 0  6. Gastroesophageal reflux disease without esophagitis - omeprazole (PRILOSEC) 20 MG capsule; Take 1 capsule (20 mg total) by mouth daily.  Dispense: 14 capsule; Refill: 0  7. Morbidly obese ([place]) - counseled on food choices      I have filled out patient's discharge paperwork and e-prescribed medications.  Patient will receive home health PT, OT and Nurse for medication management  DME provided:  Rolling walker and 3-in-1  Total discharge time: Greater than 30 minutes Greater than 50% was spent in counseling and coordination of care.   Discharge time involved coordination of the discharge process with social worker, nursing staff and therapy department. Medical justification for home health services/DME verified.   Durenda Age, DNP, FNP-BC [facility] [phone] (Monday-Friday 8:00 a.m. - 5:00 p.m.) [phone] (after hours)

## 2019-10-03 DIAGNOSIS — I69354 Hemiplegia and hemiparesis following cerebral infarction affecting left non-dominant side: Secondary | ICD-10-CM | POA: Diagnosis not present

## 2019-10-03 DIAGNOSIS — E785 Hyperlipidemia, unspecified: Secondary | ICD-10-CM

## 2019-10-03 DIAGNOSIS — E1142 Type 2 diabetes mellitus with diabetic polyneuropathy: Secondary | ICD-10-CM

## 2019-10-03 DIAGNOSIS — I252 Old myocardial infarction: Secondary | ICD-10-CM

## 2019-10-03 DIAGNOSIS — N1832 Chronic kidney disease, stage 3b: Secondary | ICD-10-CM

## 2019-10-03 DIAGNOSIS — E1122 Type 2 diabetes mellitus with diabetic chronic kidney disease: Secondary | ICD-10-CM | POA: Diagnosis not present

## 2019-10-03 DIAGNOSIS — I130 Hypertensive heart and chronic kidney disease with heart failure and stage 1 through stage 4 chronic kidney disease, or unspecified chronic kidney disease: Secondary | ICD-10-CM | POA: Diagnosis not present

## 2019-10-03 DIAGNOSIS — I5030 Unspecified diastolic (congestive) heart failure: Secondary | ICD-10-CM

## 2019-10-08 ENCOUNTER — Ambulatory Visit (INDEPENDENT_AMBULATORY_CARE_PROVIDER_SITE_OTHER): Payer: Medicare HMO | Admitting: Family

## 2019-10-08 ENCOUNTER — Encounter: Payer: Self-pay | Admitting: Family

## 2019-10-08 ENCOUNTER — Other Ambulatory Visit: Payer: Self-pay

## 2019-10-08 ENCOUNTER — Ambulatory Visit: Payer: Self-pay | Admitting: Family

## 2019-10-08 VITALS — BP 128/80 | HR 59 | Temp 97.5°F | Resp 18 | Ht 66.0 in | Wt 252.0 lb

## 2019-10-08 DIAGNOSIS — M8949 Other hypertrophic osteoarthropathy, multiple sites: Secondary | ICD-10-CM

## 2019-10-08 DIAGNOSIS — N1832 Chronic kidney disease, stage 3b: Secondary | ICD-10-CM

## 2019-10-08 DIAGNOSIS — I25110 Atherosclerotic heart disease of native coronary artery with unstable angina pectoris: Secondary | ICD-10-CM

## 2019-10-08 DIAGNOSIS — I119 Hypertensive heart disease without heart failure: Secondary | ICD-10-CM

## 2019-10-08 DIAGNOSIS — M150 Primary generalized (osteo)arthritis: Secondary | ICD-10-CM

## 2019-10-08 DIAGNOSIS — K5901 Slow transit constipation: Secondary | ICD-10-CM

## 2019-10-08 DIAGNOSIS — M159 Polyosteoarthritis, unspecified: Secondary | ICD-10-CM

## 2019-10-08 DIAGNOSIS — Z8673 Personal history of transient ischemic attack (TIA), and cerebral infarction without residual deficits: Secondary | ICD-10-CM

## 2019-10-08 DIAGNOSIS — G894 Chronic pain syndrome: Secondary | ICD-10-CM

## 2019-10-08 DIAGNOSIS — M7022 Olecranon bursitis, left elbow: Secondary | ICD-10-CM

## 2019-10-08 DIAGNOSIS — E1142 Type 2 diabetes mellitus with diabetic polyneuropathy: Secondary | ICD-10-CM | POA: Diagnosis not present

## 2019-10-08 DIAGNOSIS — E7800 Pure hypercholesterolemia, unspecified: Secondary | ICD-10-CM

## 2019-10-08 DIAGNOSIS — H6121 Impacted cerumen, right ear: Secondary | ICD-10-CM

## 2019-10-08 DIAGNOSIS — K219 Gastro-esophageal reflux disease without esophagitis: Secondary | ICD-10-CM

## 2019-10-08 DIAGNOSIS — B351 Tinea unguium: Secondary | ICD-10-CM

## 2019-10-08 MED ORDER — SENNA 8.6 MG PO TABS: 1.0000 | ORAL_TABLET | Freq: Every day | ORAL | 5 refills | Status: DC

## 2019-10-08 MED ORDER — ACCU-CHEK AVIVA PLUS VI STRP: 1.0000 | ORAL_STRIP | Freq: Every day | 3 refills | Status: DC

## 2019-10-08 MED ORDER — HYDROCODONE-ACETAMINOPHEN 10-325 MG PO TABS: 1.0000 | ORAL_TABLET | Freq: Three times a day (TID) | ORAL | 0 refills | Status: DC | PRN

## 2019-10-08 NOTE — Progress Notes (Addendum)
Provider: Marlowe Sax FNP-C   Elsia Lasota, [name], NP  Patient Care Team: Leianna Barga, [name], NP as PCP - General (Family Medicine)  Extended Emergency Contact Information Primary Emergency Contact: Sherryl Manges, [place] [address] [place] Phone: [phone] Mobile Phone: [phone] Relation: Daughter  Code Status: DNR Goals of care: Advanced Directive information Advanced Directives 10/08/2019  Does Patient Have a Medical Advance Directive? Yes  Type of Advance Directive Out of facility DNR (pink MOST or yellow form)  Does patient want to make changes to medical advance directive? No - Patient declined  Copy of [place] in Chart? -  Would patient like information on creating a medical advance directive? -     Chief Complaint  Patient presents with  . Establish Care    New Patient   . Health Maintenance    Discuss the need for Hepatitis C Screening, Foot Exam, and Eye Exam.  . Immunizations    Discuss the need for Tetanus Vaccine and PNA Vaccine.     HPI:  Pt is a 74 y.o. male seen today to establish care for medical management of chronic diseases.He is here with daughter Abie Cheek.He has a medical history of Hypertension,CAD,hx of MI,Type 2 Diabetes with peripheral Neuropathy,CKD stage 3b,GERD,Osteoarthritis,chronic pain syndrome ,constipation among other conditions.He is status post hospital admission 09/16/2019 - 09/21/2019 for left side weakness and subacute pontine stroke after presenting to ED with left side weakness and difficulty walking.Head CT scan showed hypoattenuation in the right pons.MRI done confirmed acute subacute infarct. He also had MRA that showed no significant stenosis.Had bilateral carotid Ultrasound which showed 1-39 % stenosis.ECHO showed EF of 55 -60 % with Grade 2 Diastolic dysfunction with elevated left ventricular end -diastolic pressure.Mild LAE and RAE was present . Neurology was consulted  recommended ASA 81 mg tablet daily,plavix 75 mg tablet daily for 4 months then continue with Plavix alone.swallow study showed mild aspiration.Speech language pathology was consulted recommended dysphagia 2 diet  . His blood sugars on admission was 319 stated not taken levemir at home for about a month.Levemir was restarted at 10 units SQ then titrate at 20 units at bedtime.Also on Novolog 10 units SQ three times with meals.Diabetes Coordinator was consulted who recommended Trulicity to help with blood glucose.Metformin was discontinued due to CKD. He was also treated for right elbow increase discomfort.Ultrasound showed complex olecranon bursitis,hemorrhagic/postraumatic verses infection.this was thought due to overuse.He was treated with analgesics and Voltaren gel with improvement. He states still slightly tender but swelling,redness and pain has improved.   Has had two Pfizer COVID-19 vaccine.  Discuss the need for Hepatitis C Screening,   Has not had his annual  Foot Exam, and Eye Exam.  Discuss the need for Tetanus Vaccine and PNA Vaccine.   Past Medical History:  Diagnosis Date  . Bell palsy   . CKD (chronic kidney disease), stage III 12/16/2018  . Coronary artery disease involving native coronary artery of native heart with unstable angina pectoris ([place]) 12/16/2018  . GERD (gastroesophageal reflux disease)   . Hypertension   . Hypertension with heart disease 12/16/2018  . Myocardial infarction ([place])   . Pure hypercholesterolemia 12/16/2018  . Stroke ([place])   . Type 2 diabetes, controlled, with peripheral neuropathy ([place]) 12/16/2018   Past Surgical History:  Procedure Laterality Date  . CARDIAC SURGERY    . COLONOSCOPY WITH PROPOFOL N/A 01/02/2018   Procedure: COLONOSCOPY WITH PROPOFOL;  Surgeon:  Carol Ada, MD;  Location: [facility];  Service: Endoscopy;  Laterality: N/A;  . CORONARY STENT INTERVENTION N/A 01/26/2019   Procedure: CORONARY STENT INTERVENTION;  Surgeon: Adrian Prows,  MD;  Location: [facility];  Service: Cardiovascular;  Laterality: N/A;  . LEFT HEART CATH AND CORS/GRAFTS ANGIOGRAPHY N/A 01/05/2019   Procedure: LEFT HEART CATH AND CORS/GRAFTS ANGIOGRAPHY;  Surgeon: Adrian Prows, MD;  Location: [facility];  Service: Cardiovascular;  Laterality: N/A;  . POLYPECTOMY  01/02/2018   Procedure: POLYPECTOMY;  Surgeon: Carol Ada, MD;  Location: [facility];  Service: Endoscopy;;  . stents      No Known Allergies  Allergies as of 10/08/2019   No Known Allergies     Medication List       Accurate as of Oct 08, 2019 11:18 AM. If you have any questions, ask your nurse or doctor.        STOP taking these medications   magnesium hydroxide 400 MG/5ML suspension Commonly known as: MILK OF MAGNESIA Stopped by: [name] Toshie Demelo, NP   omeprazole 20 MG capsule Commonly known as: PRILOSEC Stopped by: Sandrea Hughs, NP   senna 8.6 MG Tabs tablet Commonly known as: SENOKOT Stopped by: Sandrea Hughs, NP     TAKE these medications   aspirin EC 81 MG tablet Take 1 tablet (81 mg total) by mouth daily.   bisacodyl 10 MG suppository Commonly known as: DULCOLAX Place 10 mg rectally daily as needed for moderate constipation.   clopidogrel 75 MG tablet Commonly known as: Plavix Take 1 tablet (75 mg total) by mouth daily.   diclofenac Sodium 1 % Gel Commonly known as: VOLTAREN Apply 2 g topically 4 (four) times daily.   HYDROcodone-acetaminophen 10-325 MG tablet Commonly known as: NORCO Take 1 tablet by mouth 3 (three) times daily as needed for up to 30 doses for moderate pain.   insulin aspart 100 UNIT/ML injection Commonly known as: novoLOG Inject 0-9 Units into the skin 3 (three) times daily with meals.   insulin detemir 100 UNIT/ML injection Commonly known as: Levemir Inject 0.2 mLs (20 Units total) into the skin at bedtime.   isosorbide mononitrate 60 MG 24 hr tablet Commonly known as: IMDUR Take 60 mg by mouth daily.     losartan 25 MG tablet Commonly known as: COZAAR Take 0.5 tablets (12.5 mg total) by mouth daily.   metoprolol tartrate 25 MG tablet Commonly known as: LOPRESSOR Take 0.5 tablets (12.5 mg total) by mouth 2 (two) times daily.   nitroGLYCERIN 0.4 MG SL tablet Commonly known as: NITROSTAT Place 1 tablet (0.4 mg total) under the tongue every 5 (five) minutes as needed for chest pain.   polyethylene glycol 17 g packet Commonly known as: MIRALAX / GLYCOLAX Take 17 g by mouth as needed.   rosuvastatin 40 MG tablet Commonly known as: CRESTOR Take 1 tablet (40 mg total) by mouth daily at 6 PM.   SODIUM PHOSPHATES RE Place 1 each rectally daily as needed (Constipation not relieved by Bisacodyl).   Trulicity 7.61 YW/7.3XT Sopn Generic drug: Dulaglutide Inject 0.5 mLs into the skin once a week.       Review of Systems  Constitutional: Negative for appetite change, chills, fatigue and fever.  HENT: Positive for dental problem. Negative for congestion, postnasal drip, rhinorrhea, sinus pressure, sinus pain, sneezing, sore throat and trouble swallowing.        Broken teeth daughter looking for a dentist   Eyes: Negative for discharge,  redness, itching and visual disturbance.  Respiratory: Negative for cough, chest tightness, shortness of breath and wheezing.   Cardiovascular: Negative for palpitations and leg swelling.       Intermittent chest pain sometimes has not required Nitro   Gastrointestinal: Positive for constipation. Negative for abdominal distention, abdominal pain, diarrhea, nausea and vomiting.  Endocrine: Negative for cold intolerance, heat intolerance, polydipsia, polyphagia and polyuria.  Genitourinary: Negative for dysuria, flank pain, frequency, hematuria and urgency.  Musculoskeletal: Positive for arthralgias and gait problem. Negative for myalgias.  Skin: Negative for color change, pallor and rash.  Neurological: Positive for weakness. Negative for dizziness, speech  difficulty, light-headedness, numbness and headaches.       Left side weakness   Hematological: Does not bruise/bleed easily.  Psychiatric/Behavioral: Negative for agitation, behavioral problems, sleep disturbance and suicidal ideas. The patient is not nervous/anxious.     Immunization History  Administered Date(s) Administered  . PFIZER SARS-COV-2 Vaccination 08/13/2019, 09/06/2019   Pertinent  Health Maintenance Due  Topic Date Due  . FOOT EXAM  Never done  . OPHTHALMOLOGY EXAM  Never done  . PNA vac Low Risk Adult (1 of 2 - PCV13) Never done  . INFLUENZA VACCINE  01/02/2020  . HEMOGLOBIN A1C  03/18/2020  . COLONOSCOPY  01/03/2028   Fall Risk  10/08/2019  Falls in the past year? 0  Number falls in past yr: 0  Injury with Fall? 0       Vitals:   10/08/19 1058  BP: 128/80  Pulse: (!) 59  Resp: 18  Temp: (!) 97.5 F (36.4 C)  SpO2: 97%  Weight: 252 lb (114.3 kg)  Height: 5' 6"  (1.676 m)   Body mass index is 40.67 kg/m. Physical Exam Vitals reviewed.  Constitutional:      General: He is not in acute distress.    Appearance: He is obese. He is not ill-appearing.  HENT:     Head: Normocephalic.     Right Ear: There is impacted cerumen.     Left Ear: Tympanic membrane, ear canal and external ear normal. There is no impacted cerumen.     Ears:     Comments: Right ear cerumen impaction lavaged with warm water and hydrogen peroxide tolerated procedure well.large amounts of cerumen obtained.No instrument used.     Nose: Nose normal. No congestion or rhinorrhea.     Mouth/Throat:     Mouth: Mucous membranes are moist.     Pharynx: Oropharynx is clear. No oropharyngeal exudate or posterior oropharyngeal erythema.     Comments: Poor dentition multiple broke brownish teeth  Eyes:     General: No scleral icterus.       Right eye: No discharge.        Left eye: No discharge.     Extraocular Movements: Extraocular movements intact.     Conjunctiva/sclera: Conjunctivae  normal.     Pupils: Pupils are equal, round, and reactive to light.  Neck:     Vascular: No carotid bruit.  Cardiovascular:     Rate and Rhythm: Normal rate and regular rhythm.     Pulses: Normal pulses.     Heart sounds: Normal heart sounds. No murmur. No friction rub. No gallop.   Pulmonary:     Effort: Pulmonary effort is normal. No respiratory distress.     Breath sounds: Normal breath sounds. No wheezing, rhonchi or rales.  Chest:     Chest wall: No tenderness.  Abdominal:     General: Bowel sounds are  normal. There is no distension.     Palpations: Abdomen is soft. There is no mass.     Tenderness: There is no abdominal tenderness. There is no right CVA tenderness, left CVA tenderness, guarding or rebound.  Musculoskeletal:        General: No swelling or tenderness.     Cervical back: Normal range of motion. No rigidity or tenderness.     Right lower leg: No edema.     Left lower leg: No edema.     Comments: Unsteady gait walks with a cane   Feet:     Right foot:     Skin integrity: Skin integrity normal. No ulcer, blister, skin breakdown, erythema or warmth.     Toenail Condition: Right toenails are abnormally thick and long.     Left foot:     Skin integrity: Skin integrity normal. No ulcer, skin breakdown, erythema or warmth.     Toenail Condition: Left toenails are abnormally thick and long.  Lymphadenopathy:     Cervical: No cervical adenopathy.  Skin:    General: Skin is warm and dry.     Coloration: Skin is not pale.     Findings: No bruising, erythema, lesion or rash.  Neurological:     Mental Status: He is oriented to person, place, and time.     Cranial Nerves: No cranial nerve deficit.     Sensory: No sensory deficit.     Motor: No weakness.     Coordination: Coordination normal.  Psychiatric:        Mood and Affect: Mood normal.        Behavior: Behavior normal.        Thought Content: Thought content normal.        Judgment: Judgment normal.      Labs reviewed: Recent Labs    09/18/19 0232 09/18/19 0232 09/20/19 1128 09/21/19 0501 09/29/19 0000  NA 137   < > 138 135 135*  K 4.6   < > 4.5 4.5 4.5  CL 109   < > 104 106 101  CO2 20*   < > 26 21* 19  GLUCOSE 225*  --  179* 220*  --   BUN 37*   < > 24* 23 28*  CREATININE 1.74*   < > 1.63* 1.75* 1.7*  CALCIUM 9.0   < > 9.5 9.3 9.5   < > = values in this interval not displayed.   Recent Labs    09/16/19 0835  AST 17  ALT 22  ALKPHOS 87  BILITOT 1.0  PROT 7.6  ALBUMIN 3.8   Recent Labs    01/26/19 0536 09/16/19 0835 09/21/19 0501  WBC 6.7 9.6 8.7  NEUTROABS  --   --  5.6  HGB 13.5 15.2 13.3  HCT 41.9 45.2 39.4  MCV 95.4 89.0 88.1  PLT 128* 165 135*   No results found for: TSH Lab Results  Component Value Date   HGBA1C 10.8 (H) 09/17/2019   Lab Results  Component Value Date   CHOL 153 09/17/2019   HDL 25 (L) 09/17/2019   LDLCALC 74 09/17/2019   TRIG 269 (H) 09/17/2019   CHOLHDL 6.1 09/17/2019    Significant Diagnostic Results in last 30 days:  CT Head Wo Contrast  Result Date: 09/16/2019 CLINICAL DATA:  Dizziness, nonspecific EXAM: CT HEAD WITHOUT CONTRAST TECHNIQUE: Contiguous axial images were obtained from the base of the skull through the vertex without intravenous contrast. COMPARISON:  MRI 07/10/2011 (images only)  FINDINGS: Brain: There is a focal region of hypoattenuation in the right pons (2/10) given the patient's symptom onset timing, a subacute infarct is not excluded. Additional hypoattenuation centrally in the medulla has an appearance more compatible with streak artifact from the skull base. No other areas concerning for acute infarction. No hyperdense hemorrhage. No extra-axial collection, mass effect or midline shift. No frank hydrocephaly. Patchy areas of white matter hypoattenuation are most compatible with chronic microvascular angiopathy. Symmetric prominence of the ventricles, cisterns and sulci compatible with parenchymal volume  loss. Vascular: Atherosclerotic calcification of the carotid siphons and intradural vertebral arteries. No hyperdense vessel. Skull: No calvarial fracture or suspicious osseous lesion. No scalp swelling or hematoma. Scattered benign-appearing dermal calcifications are noted. Sinuses/Orbits: Mild mural thickening in the ethmoid air cells. Remaining paranasal sinuses are predominantly clear. Slightly hypo pneumatized appearance of the mastoid air cells with a trace right mastoid effusion. Extensive debris noted in the bilateral external auditory canals including complete impaction of the right external auditory canal. Other: None IMPRESSION: 1. Focal region of hypoattenuation in the right pons (2/10) given the patient's symptom onset timing, finding raises suspicion for a subacute infarct additional hypoattenuation centrally in the medulla has an appearance more compatible with streak artifact from the skull base. Consider further evaluation with MRI. 2. Chronic microvascular angiopathy and parenchymal volume loss. 3. Extensive debris in the bilateral external auditory canals including complete impaction of the right external auditory canal. Correlate with visual inspection for cerumen impaction. 4. Trace right mastoid effusion. Electronically Signed   By: Lovena Le M.D.   On: 09/16/2019 15:25   MR ANGIO HEAD WO CONTRAST  Result Date: 09/16/2019 CLINICAL DATA:  Ataxia EXAM: MRA HEAD WITHOUT CONTRAST TECHNIQUE: Angiographic images of the Circle of Willis were obtained using MRA technique without intravenous contrast. COMPARISON:  Brain MRI same day FINDINGS: POSTERIOR CIRCULATION: --Vertebral arteries: Normal V4 segments. --Posterior inferior cerebellar arteries (PICA): Patent origins from the vertebral arteries. --Anterior inferior cerebellar arteries (AICA): Patent origins from the basilar artery. --Basilar artery: Normal. --Superior cerebellar arteries: Normal. --Posterior cerebral arteries: Normal. The left  PCA is predominantly supplied by the posterior communicating artery. ANTERIOR CIRCULATION: --Intracranial internal carotid arteries: Normal. --Anterior cerebral arteries (ACA): Normal. Both A1 segments are present. Patent anterior communicating artery (a-comm). --Middle cerebral arteries (MCA): Normal. IMPRESSION: Normal brain MRA. Electronically Signed   By: Ulyses Jarred M.D.   On: 09/16/2019 22:55   MR BRAIN WO CONTRAST  Result Date: 09/16/2019 CLINICAL DATA:  Neuro deficit, acute, stroke suspected. Additional history provided: Difficulties with balance and walking for the past week EXAM: MRI HEAD WITHOUT CONTRAST TECHNIQUE: Multiplanar, multiecho pulse sequences of the brain and surrounding structures were obtained without intravenous contrast. COMPARISON:  Non-contrast head CT performed earlier the same day 09/16/2019 FINDINGS: Brain: Mild intermittent motion degradation. There is a 17 x 17 mm focus of restricted diffusion within the right pons consistent with acute/early subacute infarct. Corresponding T2/FLAIR hyperintensity at this site. There is no evidence of intracranial mass. No midline shift or extra-axial fluid collection. No chronic intracranial blood products. Mild scattered T2/FLAIR hyperintensity within the cerebral white matter and pons is nonspecific, but consistent with chronic small vessel ischemic disease. Small chronic lacunar infarct within the left cerebellar hemisphere. Mild generalized parenchymal atrophy. Vascular: Flow voids maintained within the proximal large arterial vessels. Skull and upper cervical spine: No focal marrow lesion. Sinuses/Orbits: Visualized orbits demonstrate no acute abnormality. Mild paranasal sinus mucosal thickening. Trace fluid within left mastoid air cells. IMPRESSION:  1. Mildly motion degraded exam. 2. Confirmed 17 mm acute/early subacute infarct within the right pons. 3. Background mild chronic small vessel ischemic disease within the cerebral white  matter and pons. Tiny chronic lacunar infarct within the left cerebellum. 4. Mild generalized parenchymal atrophy. 5. Mild paranasal sinus mucosal thickening. Trace left mastoid effusion. Electronically Signed   By: Kellie Simmering DO   On: 09/16/2019 18:14   DG Swallowing Func-Speech Pathology  Result Date: 09/20/2019 Objective Swallowing Evaluation: Type of Study: MBS-Modified Barium Swallow Study  Patient Details Name: Atilano Covelli MRN: [id number] Date of Birth: [birth year] Today's Date: 09/20/2019 Time: SLP Start Time (ACUTE ONLY): 1418 -SLP Stop Time (ACUTE ONLY): 1436 SLP Time Calculation (min) (ACUTE ONLY): 18 min Past Medical History: Past Medical History: Diagnosis Date . Bell palsy  . CKD (chronic kidney disease), stage III 12/16/2018 . Coronary artery disease involving native coronary artery of native heart with unstable angina pectoris ([place]) 12/16/2018 . Diabetes mellitus  . Diabetes mellitus without complication ([place])  . GERD (gastroesophageal reflux disease)  . Hypertension  . Hypertension with heart disease 12/16/2018 . Myocardial infarction ([place])  . Pure hypercholesterolemia 12/16/2018 . Stroke ([place])  . Type 2 diabetes, controlled, with peripheral neuropathy ([place]) 12/16/2018 Past Surgical History: Past Surgical History: Procedure Laterality Date . CARDIAC SURGERY   . COLONOSCOPY WITH PROPOFOL N/A 01/02/2018  Procedure: COLONOSCOPY WITH PROPOFOL;  Surgeon: Carol Ada, MD;  Location: [facility];  Service: Endoscopy;  Laterality: N/A; . CORONARY STENT INTERVENTION N/A 01/26/2019  Procedure: CORONARY STENT INTERVENTION;  Surgeon: Adrian Prows, MD;  Location: [facility];  Service: Cardiovascular;  Laterality: N/A; . LEFT HEART CATH AND CORS/GRAFTS ANGIOGRAPHY N/A 01/05/2019  Procedure: LEFT HEART CATH AND CORS/GRAFTS ANGIOGRAPHY;  Surgeon: Adrian Prows, MD;  Location: [facility];  Service: Cardiovascular;  Laterality: N/A; . POLYPECTOMY  01/02/2018  Procedure: POLYPECTOMY;  Surgeon: Carol Ada, MD;   Location: [facility];  Service: Endoscopy;; . stents   HPI: Pt is a 74 y.o. male with PMH of type 2 diabetes, stroke, HLD, MI, hypertension, GERD, CKD IIIB who presented with generalized weakness. MRI of the brain: 17 mm acute/early subacute infarct within the right pons. Background mild chronic small vessel ischemic disease within the erebral white matter and pons. Tiny chronic lacunar infarct within the left cerebellum. Pt passed the [place] on 09/16/19 but was then made NPO since he had a "choking" episode with pills on 09/17/19.  Subjective: pt says he coughs occasionally at the end of meals, but does not cough much during eating Assessment / Plan / Recommendation CHL IP CLINICAL IMPRESSIONS 09/18/2019 Clinical Impression Pt presents with a mild oropharyngeal dysphagia with suspected esophageal component (note that imaging was not available for review at the time of documentation). He has mildly prolonged oral preparation that may be related to lack of dentition, but he also has mild lingual residue that required Min cues for a second dry swallow. Pharyngeally he had good airway protection despite thin liquids at times reaching the pyriform sinuses prior to the swallow. He has suspected osteophytes around the level of the UES that may reduce UES relaxation as there was mild thin liquid residue in the pyriform sinuses in particular. Suspect that overall this residue of thin liquids > solids may be related to esophageal component as he also had incomplete clearance of the barium tablet despite multiple liquid washes and dry swallows (MD not present for confirmation). Recommend continuing Dys 2 diet and thin liquids. Could consider esophageal w/u  as well.  SLP Visit Diagnosis Dysphagia, pharyngoesophageal phase (R13.14) Attention and concentration deficit following -- Frontal lobe and executive function deficit following -- Impact on safety and function Mild aspiration risk   CHL IP TREATMENT RECOMMENDATION 09/18/2019  Treatment Recommendations Therapy as outlined in treatment plan below   Prognosis 09/18/2019 Prognosis for Safe Diet Advancement Good Barriers to Reach Goals -- Barriers/Prognosis Comment -- CHL IP DIET RECOMMENDATION 09/18/2019 SLP Diet Recommendations Dysphagia 2 (Fine chop) solids;Thin liquid Liquid Administration via Cup;Straw Medication Administration Whole meds with liquid Compensations Slow rate;Small sips/bites;Follow solids with liquid Postural Changes Remain semi-upright after after feeds/meals (Comment);Seated upright at 90 degrees   CHL IP OTHER RECOMMENDATIONS 09/18/2019 Recommended Consults Consider esophageal assessment Oral Care Recommendations Oral care BID Other Recommendations --   CHL IP FOLLOW UP RECOMMENDATIONS 09/18/2019 Follow up Recommendations Skilled Nursing facility;24 hour supervision/assistance   CHL IP FREQUENCY AND DURATION 09/18/2019 Speech Therapy Frequency (ACUTE ONLY) min 2x/week Treatment Duration 2 weeks      CHL IP ORAL PHASE 09/18/2019 Oral Phase Impaired Oral - Pudding Teaspoon -- Oral - Pudding Cup -- Oral - Honey Teaspoon -- Oral - Honey Cup -- Oral - Nectar Teaspoon -- Oral - Nectar Cup -- Oral - Nectar Straw -- Oral - Thin Teaspoon -- Oral - Thin Cup Lingual/palatal residue Oral - Thin Straw Lingual/palatal residue Oral - Puree Lingual/palatal residue Oral - Mech Soft Lingual/palatal residue Oral - Regular -- Oral - Multi-Consistency -- Oral - Pill WFL Oral Phase - Comment --  CHL IP PHARYNGEAL PHASE 09/18/2019 Pharyngeal Phase Impaired Pharyngeal- Pudding Teaspoon -- Pharyngeal -- Pharyngeal- Pudding Cup -- Pharyngeal -- Pharyngeal- Honey Teaspoon -- Pharyngeal -- Pharyngeal- Honey Cup -- Pharyngeal -- Pharyngeal- Nectar Teaspoon -- Pharyngeal -- Pharyngeal- Nectar Cup -- Pharyngeal -- Pharyngeal- Nectar Straw -- Pharyngeal -- Pharyngeal- Thin Teaspoon -- Pharyngeal -- Pharyngeal- Thin Cup Pharyngeal residue - pyriform Pharyngeal -- Pharyngeal- Thin Straw Pharyngeal residue -  pyriform Pharyngeal -- Pharyngeal- Puree WFL Pharyngeal -- Pharyngeal- Mechanical Soft WFL Pharyngeal -- Pharyngeal- Regular -- Pharyngeal -- Pharyngeal- Multi-consistency -- Pharyngeal -- Pharyngeal- Pill WFL Pharyngeal -- Pharyngeal Comment --  CHL IP CERVICAL ESOPHAGEAL PHASE 09/18/2019 Cervical Esophageal Phase Impaired Pudding Teaspoon -- Pudding Cup -- Honey Teaspoon -- Honey Cup -- Nectar Teaspoon -- Nectar Cup -- Nectar Straw -- Thin Teaspoon -- Thin Cup Reduced cricopharyngeal relaxation Thin Straw Reduced cricopharyngeal relaxation Puree Reduced cricopharyngeal relaxation Mechanical Soft Reduced cricopharyngeal relaxation Regular -- Multi-consistency -- Pill Reduced cricopharyngeal relaxation Cervical Esophageal Comment -- Osie Bond., M.A. [facility] Pager [phone] Office [phone] 09/20/2019, 1:23 PM              ECHOCARDIOGRAM COMPLETE  Result Date: 09/20/2019    ECHOCARDIOGRAM REPORT   Patient Name:   KORY RAINS Date of Exam: 09/17/2019 Medical Rec #:  [id number]   Height:       66.0 in Accession #:    [id number]  Weight:       250.0 lb Date of Birth:  Dec 20, 1945  BSA:          2.199 m Patient Age:    45 years    BP:           112/47 mmHg Patient Gender: M           HR:           68 bpm. Exam Location:  Inpatient Procedure: 2D Echo Indications:     stroke 434.91  History:  Patient has no prior history of Echocardiogram examinations.                  CAD, chronic kidney disease; Risk Factors:Hypertension,                  Dyslipidemia and Diabetes.  Sonographer:     Johny Chess RDCS Referring Phys:  [place] Phys: Adrian Prows MD IMPRESSIONS  1. Left ventricular ejection fraction, by estimation, is 55 to 60%. The left ventricle has normal function. The left ventricle has no regional wall motion abnormalities. Left ventricular diastolic parameters are consistent with Grade II diastolic dysfunction (pseudonormalization). Elevated left  ventricular end-diastolic pressure.  2. Right ventricular systolic function was not well visualized. The right ventricular size is mildly enlarged. Tricuspid regurgitation signal is inadequate for assessing PA pressure.  3. Left atrial size was mildly dilated.  4. Right atrial size was mildly dilated.  5. The mitral valve is normal in structure. Trivial mitral valve regurgitation.  6. The aortic valve is tricuspid. Aortic valve regurgitation is not visualized. Mild aortic valve sclerosis is present, with no evidence of aortic valve stenosis.  7. The inferior vena cava is normal in size with greater than 50% respiratory variability, suggesting right atrial pressure of 3 mmHg. Conclusion(s)/Recommendation(s): No intracardiac source of embolism detected on this transthoracic study. A transesophageal echocardiogram is recommended to exclude cardiac source of embolism if clinically indicated. Consider TEE if suspision for cardioembolic phenomenon suspected. FINDINGS  Left Ventricle: Left ventricular ejection fraction, by estimation, is 55 to 60%. The left ventricle has normal function. The left ventricle has no regional wall motion abnormalities. The left ventricular internal cavity size was normal in size. There is  no left ventricular hypertrophy. Left ventricular diastolic parameters are consistent with Grade II diastolic dysfunction (pseudonormalization). Elevated left ventricular end-diastolic pressure. Right Ventricle: The right ventricular size is mildly enlarged. No increase in right ventricular wall thickness. Right ventricular systolic function was not well visualized. Tricuspid regurgitation signal is inadequate for assessing PA pressure. Left Atrium: Left atrial size was mildly dilated. Right Atrium: Right atrial size was mildly dilated. Pericardium: There is no evidence of pericardial effusion. Mitral Valve: The mitral valve is normal in structure. Mild mitral annular calcification. Trivial mitral valve  regurgitation. Tricuspid Valve: The tricuspid valve is normal in structure. Tricuspid valve regurgitation is trivial. Aortic Valve: The aortic valve is tricuspid. Aortic valve regurgitation is not visualized. Mild aortic valve sclerosis is present, with no evidence of aortic valve stenosis. There is mild calcification of the aortic valve. Pulmonic Valve: The pulmonic valve was grossly normal. Pulmonic valve regurgitation is trivial. Aorta: The aortic root is normal in size and structure. Venous: The inferior vena cava is normal in size with greater than 50% respiratory variability, suggesting right atrial pressure of 3 mmHg. IAS/Shunts: No atrial level shunt detected by color flow Doppler.  LEFT VENTRICLE PLAX 2D LVIDd:         5.00 cm  Diastology LVIDs:         3.50 cm  LV e' lateral:   9.79 cm/s LV PW:         0.90 cm  LV E/e' lateral: 9.3 LV IVS:        1.00 cm  LV e' medial:    6.20 cm/s LVOT diam:     2.20 cm  LV E/e' medial:  14.6 LV SV:         75 LV SV Index:  34 LVOT Area:     3.80 cm  RIGHT VENTRICLE RV S prime:     8.59 cm/s TAPSE (M-mode): 1.2 cm LEFT ATRIUM             Index LA diam:        3.70 cm 1.68 cm/m LA Vol (A2C):   60.7 ml 27.61 ml/m LA Vol (A4C):   50.7 ml 23.06 ml/m LA Biplane Vol: 56.9 ml 25.88 ml/m  AORTIC VALVE LVOT Vmax:   97.50 cm/s LVOT Vmean:  57.600 cm/s LVOT VTI:    0.196 m  AORTA Ao Root diam: 3.00 cm MITRAL VALVE MV Area (PHT): 3.03 cm     SHUNTS MV Decel Time: 250 msec     Systemic VTI:  0.20 m MV E velocity: 90.70 cm/s   Systemic Diam: 2.20 cm MV A velocity: 104.00 cm/s MV E/A ratio:  0.87 Adrian Prows MD Electronically signed by Adrian Prows MD Signature Date/Time: 09/20/2019/8:29:30 PM    Final    [place] LT UPPER EXTREM LTD SOFT TISSUE NON VASCULAR  Result Date: 09/21/2019 CLINICAL DATA:  Pain and swelling over the left elbow. EXAM: ULTRASOUND left UPPER EXTREMITY LIMITED TECHNIQUE: Ultrasound examination of the upper extremity soft tissues was performed in the area of clinical  concern. COMPARISON:  None. FINDINGS: Diffuse soft tissue swelling and complex fluid overlying the olecranon region. Findings likely due to complex olecranon bursitis. This could be hemorrhagic or infectious. IMPRESSION: Findings suggests complex olecranon bursitis, hemorrhagic/posttraumatic versus infectious. Electronically Signed   By: Marijo Sanes M.D.   On: 09/21/2019 07:27   VAS US CAROTID  Result Date: 09/17/2019 Carotid Arterial Duplex Study Indications:       CVA. Risk Factors:      Hypertension, Diabetes, coronary artery disease, prior CVA. Limitations        Today's exam was limited due to the body habitus of the                    patient. Comparison Study:  no prior Performing Technologist: Abram Sander RVS  Examination Guidelines: A complete evaluation includes B-mode imaging, spectral Doppler, color Doppler, and power Doppler as needed of all accessible portions of each vessel. Bilateral testing is considered an integral part of a complete examination. Limited examinations for reoccurring indications may be performed as noted.  Right Carotid Findings: +----------+--------+--------+--------+------------------+--------+           PSV cm/sEDV cm/sStenosisPlaque DescriptionComments +----------+--------+--------+--------+------------------+--------+ CCA Prox  83      11              heterogenous               +----------+--------+--------+--------+------------------+--------+ CCA Distal80      9               heterogenous               +----------+--------+--------+--------+------------------+--------+ ICA Prox  76      22      1-39%   heterogenous               +----------+--------+--------+--------+------------------+--------+ ICA Distal49      13                                         +----------+--------+--------+--------+------------------+--------+ ECA       62                                                  +----------+--------+--------+--------+------------------+--------+ +----------+--------+-------+--------+-------------------+  PSV cm/sEDV cmsDescribeArm Pressure (mmHG) +----------+--------+-------+--------+-------------------+ [id number]                                         +----------+--------+-------+--------+-------------------+ +---------+--------+--------+------------+ VertebralPSV cm/sEDV cm/sNot assessed +---------+--------+--------+------------+  Left Carotid Findings: +----------+--------+--------+--------+------------------+--------+           PSV cm/sEDV cm/sStenosisPlaque DescriptionComments +----------+--------+--------+--------+------------------+--------+ CCA Prox  140     17              heterogenous               +----------+--------+--------+--------+------------------+--------+ CCA Distal62      13              heterogenous               +----------+--------+--------+--------+------------------+--------+ ICA Prox  65      20      1-39%   heterogenous               +----------+--------+--------+--------+------------------+--------+ ICA Distal54      18                                         +----------+--------+--------+--------+------------------+--------+ ECA       64      5                                          +----------+--------+--------+--------+------------------+--------+ +----------+--------+--------+--------+-------------------+           PSV cm/sEDV cm/sDescribeArm Pressure (mmHG) +----------+--------+--------+--------+-------------------+ [id number]                                          +----------+--------+--------+--------+-------------------+ +---------+--------+--+--------+-+---------+ VertebralPSV cm/s35EDV cm/s8Antegrade +---------+--------+--+--------+-+---------+   Summary: Right Carotid: Velocities in the right ICA are consistent with a 1-39% stenosis. Left Carotid:  Velocities in the left ICA are consistent with a 1-39% stenosis. Vertebrals: Left vertebral artery demonstrates antegrade flow. Right vertebral             artery was not visualized. *See table(s) above for measurements and observations.  Electronically signed by Antony Contras MD on 09/17/2019 at 1:46:35 PM.    Final     Assessment/Plan 1. Type 2 diabetes, controlled, with peripheral neuropathy (HCC) Lab Results  Component Value Date   HGBA1C 10.8 (H) 09/17/2019  Uncontrolled.Status post hospitalization as above.Trulicity initiated.Has not been using insulin as directed.Daughter and [facility] will assist with medical management. - continue on weekly Trulicity,Levemir 20 units at bedtime and  Novolog 10 units SQ three times with meals. - glucose blood (ACCU-CHEK AVIVA PLUS) test strip; 1 each by Other route daily. E11.42  Dispense: 100 each; Refill: 3 - Ambulatory referral to Ophthalmology for annual eye exam  - Ambulatory referral to Podiatry for annual foot exam and trim overgrown toenail. - Hemoglobin A1c; Future  2. Primary osteoarthritis involving multiple joints - continue on current pain regimen and exercise. - Drugs of abuse screen w/o alc, rtn urine-sln  3. Chronic pain syndrome Continue on Hydrocodone-APAP 10-325 mg tablet one three times daily as needed. - Narcotic use and high risk for overdose discussed patient verbalized understanding.Narcotic contract  signed. - Drugs of abuse screen w/o alc, rtn urine-sln  4. Hypertension with heart disease B/p controlled.Continue on current medication. - CBC with Differential/Platelet; Future - CMP with eGFR([facility]); Future - TSH; Future  5. Coronary artery disease involving native coronary artery of native heart with unstable angina pectoris (HCC) Chest pain free.continue on ASA 81 mg tablet daily and plavix 75 mg tablet daily for 4 months then continue with Plavix alone.continue on Crestor 40 mg tablet daily,Nitro PRN and Imdur 60 mg  24 Hr tablet daily.  6. Stage 3b chronic kidney disease Avoid NSAIDs and nephrotoxins.CR 1.74> 1.63   7. History of stroke No residual.continue on ASA 81 mg tablet daily and plavix 75 mg tablet daily for 4 months then continue with Plavix alone.continue on Crestor 40 mg tablet daily, Imdur 60 mg 24 Hr tablet daily,losartan 12.5 mg tablet daily and metoprolol tartrate 12.5 mg tablet twice daily.dietary modification and exercise advised.   8. Onychomycosis of toenail Over grown toenails. -  Ambulatory referral to Podiatry to trim overgrown toenail.  9. Pure hypercholesterolemia - dietary modification and exercise advised.Additional education information provided on AVS. - continue on Crestor 40 mg tablet daily.  - Lipid panel; Future  10. Impacted cerumen of right ear Right ear cerumen impaction lavaged with warm water and hydrogen peroxide tolerated procedure well.large amounts of cerumen obtained.No instrument used.   11. Olecranon bursitis of left elbow Swelling,redness and pain has improved.treated with analgesic during hospitalization.continue on Voltaren gel as needed.   12. Gastroesophageal reflux disease without esophagitis Symptoms stable.Not on any medication. - CBC with Differential/Platelet; Future  13. Slow transit constipation Current medication effective.Advised to increase fiber in diet and water intake.exercise encouraged.  - senna (SENOKOT) 8.6 MG TABS tablet; Take 1 tablet (8.6 mg total) by mouth daily.  Dispense: 120 tablet; Refill: 5  Family/ staff Communication: Reviewed plan of care with patient and daughter verbalized understanding.  Labs/tests ordered:  - CBC with Differential/Platelet; Future - CMP with eGFR([facility]); Future - TSH; Future - Lipid panel; Future - Hemoglobin A1c; Future  Next Appointment : 3 months for medical management of chronic issues.Labs 2-4 days prior to visit.  Time spent with patient 45 minutes >50% time spent counseling;  reviewing medical record; tests; labs; and developing future plan of care  Sandrea Hughs, NP

## 2019-10-12 ENCOUNTER — Other Ambulatory Visit: Payer: Self-pay

## 2019-10-12 DIAGNOSIS — M159 Polyosteoarthritis, unspecified: Secondary | ICD-10-CM

## 2019-10-12 DIAGNOSIS — M8949 Other hypertrophic osteoarthropathy, multiple sites: Secondary | ICD-10-CM

## 2019-10-12 DIAGNOSIS — G894 Chronic pain syndrome: Secondary | ICD-10-CM

## 2019-10-12 DIAGNOSIS — M7022 Olecranon bursitis, left elbow: Secondary | ICD-10-CM

## 2019-10-12 LAB — DM TEMPLATE

## 2019-10-12 LAB — DRUG MONITOR, PANEL 1, W/CONF, URINE
Amphetamines: NEGATIVE ng/mL (ref <500)
Barbiturates: NEGATIVE ng/mL (ref <300)
Benzodiazepines: NEGATIVE ng/mL (ref <100)
Cocaine Metabolite: NEGATIVE ng/mL (ref <150)
Codeine: NEGATIVE ng/mL (ref <50)
Creatinine: >300 mg/dL
Hydrocodone: 2588 ng/mL — ABNORMAL HIGH (ref <50)
Hydromorphone: 736 ng/mL — ABNORMAL HIGH (ref <50)
Marijuana Metabolite: NEGATIVE ng/mL (ref <20)
Methadone Metabolite: NEGATIVE ng/mL (ref <100)
Morphine: NEGATIVE ng/mL (ref <50)
Norhydrocodone: 2625 ng/mL — ABNORMAL HIGH (ref <50)
Opiates: POSITIVE ng/mL — AB (ref <100)
Oxidant: NEGATIVE ug/mL
Oxycodone: NEGATIVE ng/mL (ref <100)
Phencyclidine: NEGATIVE ng/mL (ref <25)
pH: 6.4 (ref 4.5–9.0)

## 2019-10-12 MED ORDER — HYDROCODONE-ACETAMINOPHEN 10-325 MG PO TABS: 1.0000 | ORAL_TABLET | Freq: Three times a day (TID) | ORAL | 0 refills | Status: DC | PRN

## 2019-10-12 NOTE — Telephone Encounter (Signed)
He is requesting a refill on Norco. Rx was sent 10/08/19 for #15 tabs to be taken TID PRN, with a notation for up to 30 doses prn.  I am pending a refill in the event that it is eligible for refill.

## 2019-10-14 ENCOUNTER — Ambulatory Visit: Payer: Self-pay | Admitting: Family

## 2019-10-18 ENCOUNTER — Telehealth: Payer: Self-pay | Admitting: Family

## 2019-10-18 NOTE — Telephone Encounter (Signed)
May give verbal order to decrease frequency of service as requested.

## 2019-10-18 NOTE — Telephone Encounter (Signed)
[facility] w/[facility] called and she needs an order to decrease frequency of service for patient to once per week.  Please call Colletta [place] at [phone].  Thank you  Adan Sis

## 2019-10-18 NOTE — Telephone Encounter (Signed)
David Burton with [facility] called back and verbal order was given.

## 2019-10-21 NOTE — Addendum Note (Signed)
Addended byMarlowe Sax C on: 10/21/2019 12:15 PM   Modules accepted: Level of Service

## 2019-10-25 ENCOUNTER — Other Ambulatory Visit: Payer: Self-pay | Admitting: Family

## 2019-10-25 ENCOUNTER — Other Ambulatory Visit: Payer: Self-pay | Admitting: Adult Health

## 2019-10-25 DIAGNOSIS — I129 Hypertensive chronic kidney disease with stage 1 through stage 4 chronic kidney disease, or unspecified chronic kidney disease: Secondary | ICD-10-CM

## 2019-10-25 DIAGNOSIS — N183 Chronic kidney disease, stage 3 unspecified: Secondary | ICD-10-CM

## 2019-10-25 DIAGNOSIS — Z8673 Personal history of transient ischemic attack (TIA), and cerebral infarction without residual deficits: Secondary | ICD-10-CM

## 2019-10-25 MED ORDER — LOSARTAN POTASSIUM 25 MG PO TABS: ORAL_TABLET | ORAL | 1 refills | Status: DC

## 2019-10-25 MED ORDER — ROSUVASTATIN CALCIUM 40 MG PO TABS: 40.0000 mg | ORAL_TABLET | Freq: Every day | ORAL | 1 refills | Status: DC

## 2019-10-25 NOTE — Telephone Encounter (Signed)
David Burton, daughter requested refill for 90 day supply. Faxed to pharmacy.

## 2019-10-25 NOTE — Telephone Encounter (Signed)
rx sent to pharmacy by e-script  

## 2019-10-26 ENCOUNTER — Inpatient Hospital Stay: Payer: Medicare HMO | Admitting: Adult Health

## 2019-10-27 ENCOUNTER — Other Ambulatory Visit: Payer: Self-pay | Admitting: Family

## 2019-10-27 DIAGNOSIS — I129 Hypertensive chronic kidney disease with stage 1 through stage 4 chronic kidney disease, or unspecified chronic kidney disease: Secondary | ICD-10-CM

## 2019-10-27 DIAGNOSIS — N183 Chronic kidney disease, stage 3 unspecified: Secondary | ICD-10-CM

## 2019-11-02 NOTE — Progress Notes (Deleted)
[facility] [address] [place]. [place] [address] [phone]       HOSPITAL FOLLOW UP NOTE  [name] Date of Birth:  1946/05/03 Medical Record Number:  [id number]   Reason for Referral:  hospital stroke follow up    SUBJECTIVE:   CHIEF COMPLAINT:  No chief complaint on file.   HPI:   [name] is a 74 y.o. male with PMH of HTN, DM, HLD  who presented on 09/16/2019 with intermittent and worsening left side weakness x1 week with difficulty walking.  Stroke work-up revealed right pontine lacunar infarct secondary to small vessel disease.  No evidence of LVO or significant stenosis.  UDS positive for cocaine.  Previously on aspirin and recommended DAPT for 3 weeks followed by Plavix alone.  History of HTN stable with ongoing use of nitro, Zocor, Norvasc, Apresoline and Imdur.  LDL 74 and initiated Crestor 40 mg daily.  Uncontrolled DM with A1c 10.8 with home meds NovoLog, Levemir and Glucophage.  Other stroke risk factors include advanced age, prior tobacco use, obesity, CAD w/ MI s/p stents (prev on Plavix but stopped in last yr), family history of stroke, THC use and cocaine abuse.  No prior stroke history.  Other active problems include AKI on CKD and history of right-sided Bell's palsy.  Evaluated by therapies and recommended discharge to SNF for ongoing therapy needs.  Stroke: Right pontine lacunar infarct secondary to small vessel disease   MRI  Right Pontine infarct   MRA  normal  Carotid Doppler no significant stenosis  2D Echo EF 55 to 60%  UDS - positive for cocaine  LDL 74 -initiated Crestor  HgbA1c 10.8 -f/u PCP  Lovenox for VTE prophylaxis  ASA prior to admission, now on aspirin and Plavix for 3 weeks followed by Plavix alone.  Therapy recommendations:  SNF  Disposition: SNF  Today, 11/02/2019, David Burton is being seen for hospital follow-up.  He has been stable from a stroke standpoint and discharge from SNF rehab to home  on 10/02/2019.  Per review of hospital discharge, recommended DAPT for 59-month duration and then Plavix alone -unknown reason behind 2-month duration.  He has continued on DAPT without bleeding or bruising.  Continues on Crestor without myalgias.  Blood pressure today ***.  He has established care with new PCP [name], [name], NP continues to manage HTN, HLD and DM.  Substance abuse ***.  No concerns at this time.       ROS:   14 system review of systems performed and negative with exception of ***  PMH:  Past Medical History:  Diagnosis Date  . Bell palsy   . CKD (chronic kidney disease), stage III 12/16/2018  . Coronary artery disease involving native coronary artery of native heart with unstable angina pectoris ([place]) 12/16/2018  . GERD (gastroesophageal reflux disease)   . Hypertension   . Hypertension with heart disease 12/16/2018  . Myocardial infarction ([place])   . Pure hypercholesterolemia 12/16/2018  . Stroke ([place])   . Type 2 diabetes, controlled, with peripheral neuropathy ([place]) 12/16/2018    PSH:  Past Surgical History:  Procedure Laterality Date  . CARDIAC SURGERY    . COLONOSCOPY WITH PROPOFOL N/A 01/02/2018   Procedure: COLONOSCOPY WITH PROPOFOL;  Surgeon: Carol Ada, MD;  Location: [facility];  Service: Endoscopy;  Laterality: N/A;  . CORONARY STENT INTERVENTION N/A 01/26/2019   Procedure: CORONARY STENT INTERVENTION;  Surgeon: Adrian Prows, MD;  Location: [place] [facility];  Service: Cardiovascular;  Laterality: N/A;  . LEFT HEART CATH AND CORS/GRAFTS ANGIOGRAPHY N/A 01/05/2019   Procedure: LEFT HEART CATH AND CORS/GRAFTS ANGIOGRAPHY;  Surgeon: Adrian Prows, MD;  Location: [facility];  Service: Cardiovascular;  Laterality: N/A;  . POLYPECTOMY  01/02/2018   Procedure: POLYPECTOMY;  Surgeon: Carol Ada, MD;  Location: [facility];  Service: Endoscopy;;  . stents      Social History:  Social History   Socioeconomic History  . Marital status: Divorced    Spouse  name: Not on file  . Number of children: 3  . Years of education: Not on file  . Highest education level: Not on file  Occupational History  . Not on file  Tobacco Use  . Smoking status: Former Smoker    Packs/day: 1.00    Years: 5.00    Pack years: 5.00    Types: Cigarettes    Quit date: 2005    Years since quitting: 16.4  . Smokeless tobacco: Never Used  Substance and Sexual Activity  . Alcohol use: Not Currently  . Drug use: No  . Sexual activity: Never  Other Topics Concern  . Not on file  Social History Narrative   Tobacco use, amount per day now:   Past tobacco use, amount per day: Pack a week.   How many years did you use tobacco:   Alcohol use (drinks per week):   Diet:   Do you drink/eat things with caffeine:   Marital status:                                  What year were you married?   Do you live in a house, apartment, assisted living, condo, trailer, etc.? Apartment.   Is it one or more stories? Yes   How many persons live in your home? 1   Do you have pets in your home?( please list) No.   Highest Level of education completed: 8th Grade   Current or past profession: Administrator, Architect.    Do you exercise? No.                                    Type and how often? No   Do you have a living will? No   Do you have a DNR form?  No                                 If not, do you want to discuss one?   Do you have signed POA/HPOA forms?                        If so, please bring to you appointment    Do you have any difficulty bathing or dressing yourself? No.   Do you have difficulty preparing food or eating? No.   Do you have difficulty managing your medications? Yes.   Do you have any difficulty managing your finances? No.   Do you have any difficulty affording your medications? No.      Social Determinants of Health   Financial Resource Strain:   . Difficulty of Paying Living Expenses:   Food Insecurity:   . Worried About Charity fundraiser in  the Last Year:   . [employer]  of Food in the Last Year:   Transportation Needs:   . Film/video editor (Medical):   Marland Kitchen Lack of Transportation (Non-Medical):   Physical Activity:   . Days of Exercise per Week:   . Minutes of Exercise per Session:   Stress:   . Feeling of Stress :   Social Connections:   . Frequency of Communication with Friends and Family:   . Frequency of Social Gatherings with Friends and Family:   . Attends Religious Services:   . Active Member of Clubs or Organizations:   . Attends Archivist Meetings:   Marland Kitchen Marital Status:   Intimate Partner Violence:   . Fear of Current or Ex-Partner:   . Emotionally Abused:   Marland Kitchen Physically Abused:   . Sexually Abused:     Family History:  Family History  Problem Relation Age of Onset  . Cancer Mother   . Heart attack Father     Medications:   Current Outpatient Medications on File Prior to Visit  Medication Sig Dispense Refill  . aspirin EC 81 MG tablet Take 1 tablet (81 mg total) by mouth daily. 30 tablet 0  . bisacodyl (DULCOLAX) 10 MG suppository Place 10 mg rectally daily as needed for moderate constipation.    . clopidogrel (PLAVIX) 75 MG tablet Take 1 tablet (75 mg total) by mouth daily. 30 tablet 0  . diclofenac Sodium (VOLTAREN) 1 % GEL Apply 2 g topically 4 (four) times daily. 50 g 0  . Dulaglutide (TRULICITY) 6.21 HY/8.6VH SOPN Inject 0.5 mLs into the skin once a week. 4 pen 0  . glucose blood (ACCU-CHEK AVIVA PLUS) test strip 1 each by Other route daily. E11.42 100 each 3  . HYDROcodone-acetaminophen (NORCO) 10-325 MG tablet Take 1 tablet by mouth 3 (three) times daily as needed for up to 30 doses for moderate pain. 90 tablet 0  . insulin aspart (NOVOLOG) 100 UNIT/ML injection Inject 0-9 Units into the skin 3 (three) times daily with meals. 10 mL 0  . insulin detemir (LEVEMIR) 100 UNIT/ML FlexPen Inject 20 Units into the skin at bedtime.    . isosorbide mononitrate (IMDUR) 60 MG 24 hr tablet Take 60  mg by mouth daily.    Marland Kitchen losartan (COZAAR) 25 MG tablet TAKE 1/2 TABLET(12.5 MG) BY MOUTH DAILY 45 tablet 1  . metoprolol tartrate (LOPRESSOR) 25 MG tablet TAKE 1/2 TABLET(12.5 MG) BY MOUTH TWICE DAILY 90 tablet 1  . nitroGLYCERIN (NITROSTAT) 0.4 MG SL tablet Place 1 tablet (0.4 mg total) under the tongue every 5 (five) minutes as needed for chest pain. 15 tablet 0  . polyethylene glycol (MIRALAX / GLYCOLAX) 17 g packet Take 17 g by mouth as needed.     . rosuvastatin (CRESTOR) 40 MG tablet Take 1 tablet (40 mg total) by mouth daily at 6 PM. 90 tablet 1  . senna (SENOKOT) 8.6 MG TABS tablet Take 1 tablet (8.6 mg total) by mouth daily. 120 tablet 5  . SODIUM PHOSPHATES RE Place 1 each rectally daily as needed (Constipation not relieved by Bisacodyl).     No current facility-administered medications on file prior to visit.    Allergies:  No Known Allergies    OBJECTIVE:  Physical Exam  There were no vitals filed for this visit. There is no height or weight on file to calculate BMI. No exam data present  No flowsheet data found.   General: well developed, well nourished, seated, in no evident distress Head: head normocephalic and  atraumatic.   Neck: supple with no carotid or supraclavicular bruits Cardiovascular: regular rate and rhythm, no murmurs Musculoskeletal: no deformity Skin:  no rash/petichiae Vascular:  Normal pulses all extremities   Neurologic Exam Mental Status: Awake and fully alert. Oriented to place and time. Recent and remote memory intact. Attention span, concentration and fund of knowledge appropriate. Mood and affect appropriate.  Cranial Nerves: Fundoscopic exam reveals sharp disc margins. Pupils equal, briskly reactive to light. Extraocular movements full without nystagmus. Visual fields full to confrontation. Hearing intact. Facial sensation intact. Face, tongue, palate moves normally and symmetrically.  Motor: Normal bulk and tone. Normal strength in all  tested extremity muscles. Sensory.: intact to touch , pinprick , position and vibratory sensation.  Coordination: Rapid alternating movements normal in all extremities. Finger-to-nose and heel-to-shin performed accurately bilaterally. Gait and Station: Arises from chair without difficulty. Stance is normal. Gait demonstrates normal stride length and balance Reflexes: 1+ and symmetric. Toes downgoing.     NIHSS  *** Modified Rankin  ***     ASSESSMENT: Bruin Bolger is a 74 y.o. year old male presented with 1 week history of intermittent/worsening left-sided weakness and gait impairment on 09/16/2019 with stroke work-up revealing right pontine lacunar infarct secondary to small vessel disease. Vascular risk factors include HTN, HLD, uncontrolled DM, cocaine use, history of tobacco use, THC use, CAD w/ MI s/p stents and obesity.      PLAN:  1. Right pontine stroke: Continue clopidogrel 75 mg daily  and Crestor 40 mg daily for secondary stroke prevention. Maintain strict control of hypertension with blood pressure goal below 130/90, diabetes with hemoglobin A1c goal below 6.5% and cholesterol with LDL cholesterol (bad cholesterol) goal below 70 mg/dL.  I also advised the patient to eat a healthy diet with plenty of whole grains, cereals, fruits and vegetables, exercise regularly with at least 30 minutes of continuous activity daily and maintain ideal body weight. 2. HTN: Advised to continue current treatment regimen.  Today's BP ***.  Advised to continue to monitor at home along with continued follow-up with PCP for management 3. HLD: Advised to continue current treatment regimen along with continued follow-up with PCP for future prescribing and monitoring of lipid panel 4. DMII: Advised to continue to monitor glucose levels at home along with continued follow-up with PCP for management and monitoring    Follow up in *** or call earlier if needed   I spent *** minutes of face-to-face and  non-face-to-face time with patient.  This included previsit chart review, lab review, study review, order entry, electronic health record documentation, patient education regarding recent stroke, residual deficits, importance of managing stroke risk factors and answered all questions to patient satisfaction     Frann Rider, [facility]  [facility] [address] [place], [place] [address]  Phone [phone] Fax [phone] Note: This document was prepared with digital dictation and possible smart phrase technology. Any transcriptional errors that result from this process are unintentional.

## 2019-11-03 ENCOUNTER — Encounter: Payer: Self-pay | Admitting: Adult Health

## 2019-11-03 ENCOUNTER — Inpatient Hospital Stay: Payer: Medicare HMO | Admitting: Adult Health

## 2019-11-05 ENCOUNTER — Other Ambulatory Visit: Payer: Self-pay | Admitting: Adult Health

## 2019-11-05 DIAGNOSIS — E1142 Type 2 diabetes mellitus with diabetic polyneuropathy: Secondary | ICD-10-CM

## 2019-11-08 ENCOUNTER — Ambulatory Visit: Payer: Medicare HMO | Admitting: Podiatry

## 2019-11-09 ENCOUNTER — Ambulatory Visit: Payer: Medicare HMO | Admitting: Cardiology

## 2019-11-11 ENCOUNTER — Ambulatory Visit: Payer: Medicare HMO | Admitting: Cardiology

## 2019-11-12 ENCOUNTER — Other Ambulatory Visit: Payer: Self-pay | Admitting: *Deleted

## 2019-11-12 DIAGNOSIS — M8949 Other hypertrophic osteoarthropathy, multiple sites: Secondary | ICD-10-CM

## 2019-11-12 DIAGNOSIS — G894 Chronic pain syndrome: Secondary | ICD-10-CM

## 2019-11-12 DIAGNOSIS — M159 Polyosteoarthritis, unspecified: Secondary | ICD-10-CM

## 2019-11-12 DIAGNOSIS — M7022 Olecranon bursitis, left elbow: Secondary | ICD-10-CM

## 2019-11-12 MED ORDER — HYDROCODONE-ACETAMINOPHEN 10-325 MG PO TABS: 1.0000 | ORAL_TABLET | Freq: Three times a day (TID) | ORAL | 0 refills | Status: DC | PRN

## 2019-11-12 NOTE — Telephone Encounter (Signed)
Refill Request by patient [place] Verified LR: 10/13/2019 Narcotic Contract updated.  Pended Rx and sent to [facility] for approval.

## 2019-11-27 ENCOUNTER — Other Ambulatory Visit: Payer: Self-pay | Admitting: Adult Health

## 2019-11-27 DIAGNOSIS — E1142 Type 2 diabetes mellitus with diabetic polyneuropathy: Secondary | ICD-10-CM

## 2019-12-03 ENCOUNTER — Ambulatory Visit: Payer: Medicare HMO | Admitting: Podiatry

## 2019-12-10 ENCOUNTER — Other Ambulatory Visit: Payer: Self-pay | Admitting: *Deleted

## 2019-12-10 DIAGNOSIS — M159 Polyosteoarthritis, unspecified: Secondary | ICD-10-CM

## 2019-12-10 DIAGNOSIS — M7022 Olecranon bursitis, left elbow: Secondary | ICD-10-CM

## 2019-12-10 DIAGNOSIS — G894 Chronic pain syndrome: Secondary | ICD-10-CM

## 2019-12-10 DIAGNOSIS — M8949 Other hypertrophic osteoarthropathy, multiple sites: Secondary | ICD-10-CM

## 2019-12-10 MED ORDER — HYDROCODONE-ACETAMINOPHEN 10-325 MG PO TABS: 1.0000 | ORAL_TABLET | Freq: Three times a day (TID) | ORAL | 0 refills | Status: DC | PRN

## 2019-12-10 NOTE — Telephone Encounter (Signed)
Patient called and requested refill Pended Rx and sent to [facility] for approval Epic LR: 11/12/19 Narcotic Contract updated.

## 2020-01-05 ENCOUNTER — Other Ambulatory Visit: Payer: Self-pay | Admitting: *Deleted

## 2020-01-05 ENCOUNTER — Other Ambulatory Visit: Payer: Self-pay | Admitting: Cardiology

## 2020-01-05 DIAGNOSIS — Z8673 Personal history of transient ischemic attack (TIA), and cerebral infarction without residual deficits: Secondary | ICD-10-CM

## 2020-01-05 DIAGNOSIS — G894 Chronic pain syndrome: Secondary | ICD-10-CM

## 2020-01-05 DIAGNOSIS — M159 Polyosteoarthritis, unspecified: Secondary | ICD-10-CM

## 2020-01-05 DIAGNOSIS — M8949 Other hypertrophic osteoarthropathy, multiple sites: Secondary | ICD-10-CM

## 2020-01-05 DIAGNOSIS — I25110 Atherosclerotic heart disease of native coronary artery with unstable angina pectoris: Secondary | ICD-10-CM

## 2020-01-05 DIAGNOSIS — M150 Primary generalized (osteo)arthritis: Secondary | ICD-10-CM

## 2020-01-05 DIAGNOSIS — M7022 Olecranon bursitis, left elbow: Secondary | ICD-10-CM

## 2020-01-05 NOTE — Telephone Encounter (Signed)
Patient requested refill.  Contract updated Epic LR: 12/10/2019  Will forward to [place] on Friday for approval.

## 2020-01-07 ENCOUNTER — Other Ambulatory Visit: Payer: Self-pay

## 2020-01-07 DIAGNOSIS — E1142 Type 2 diabetes mellitus with diabetic polyneuropathy: Secondary | ICD-10-CM

## 2020-01-07 DIAGNOSIS — I119 Hypertensive heart disease without heart failure: Secondary | ICD-10-CM

## 2020-01-07 DIAGNOSIS — K219 Gastro-esophageal reflux disease without esophagitis: Secondary | ICD-10-CM

## 2020-01-07 DIAGNOSIS — E7800 Pure hypercholesterolemia, unspecified: Secondary | ICD-10-CM

## 2020-01-07 MED ORDER — HYDROCODONE-ACETAMINOPHEN 10-325 MG PO TABS: 1.0000 | ORAL_TABLET | Freq: Three times a day (TID) | ORAL | 0 refills | Status: DC | PRN

## 2020-01-10 ENCOUNTER — Other Ambulatory Visit: Payer: Medicare HMO

## 2020-01-11 ENCOUNTER — Encounter: Payer: Self-pay | Admitting: Cardiology

## 2020-01-11 ENCOUNTER — Other Ambulatory Visit: Payer: Self-pay

## 2020-01-11 ENCOUNTER — Telehealth: Payer: Self-pay

## 2020-01-11 ENCOUNTER — Ambulatory Visit: Payer: Medicare HMO | Admitting: Cardiology

## 2020-01-11 VITALS — BP 141/77 | HR 76 | Resp 16 | Ht 66.0 in | Wt 254.0 lb

## 2020-01-11 DIAGNOSIS — Z8673 Personal history of transient ischemic attack (TIA), and cerebral infarction without residual deficits: Secondary | ICD-10-CM

## 2020-01-11 DIAGNOSIS — Z87891 Personal history of nicotine dependence: Secondary | ICD-10-CM

## 2020-01-11 DIAGNOSIS — I119 Hypertensive heart disease without heart failure: Secondary | ICD-10-CM

## 2020-01-11 DIAGNOSIS — I2510 Atherosclerotic heart disease of native coronary artery without angina pectoris: Secondary | ICD-10-CM

## 2020-01-11 DIAGNOSIS — E7800 Pure hypercholesterolemia, unspecified: Secondary | ICD-10-CM

## 2020-01-11 DIAGNOSIS — Z955 Presence of coronary angioplasty implant and graft: Secondary | ICD-10-CM

## 2020-01-11 DIAGNOSIS — E1142 Type 2 diabetes mellitus with diabetic polyneuropathy: Secondary | ICD-10-CM

## 2020-01-11 DIAGNOSIS — Z6839 Body mass index (BMI) 39.0-39.9, adult: Secondary | ICD-10-CM

## 2020-01-11 DIAGNOSIS — E1165 Type 2 diabetes mellitus with hyperglycemia: Secondary | ICD-10-CM

## 2020-01-11 DIAGNOSIS — Z951 Presence of aortocoronary bypass graft: Secondary | ICD-10-CM

## 2020-01-11 DIAGNOSIS — IMO0002 Reserved for concepts with insufficient information to code with codable children: Secondary | ICD-10-CM

## 2020-01-11 DIAGNOSIS — N1832 Chronic kidney disease, stage 3b: Secondary | ICD-10-CM

## 2020-01-11 LAB — CBC WITH DIFFERENTIAL/PLATELET
Absolute Monocytes: 640 cells/uL (ref 200–950)
Basophils Absolute: 32 cells/uL (ref 0–200)
Basophils Relative: 0.4 %
Eosinophils Absolute: 111 cells/uL (ref 15–500)
Eosinophils Relative: 1.4 %
HCT: 44.3 % (ref 38.5–50.0)
Hemoglobin: 14.7 g/dL (ref 13.2–17.1)
Lymphs Abs: 1762 cells/uL (ref 850–3900)
MCH: 29.6 pg (ref 27.0–33.0)
MCHC: 33.2 g/dL (ref 32.0–36.0)
MCV: 89.3 fL (ref 80.0–100.0)
MPV: 11.2 fL (ref 7.5–12.5)
Monocytes Relative: 8.1 %
Neutro Abs: 5356 cells/uL (ref 1500–7800)
Neutrophils Relative %: 67.8 %
Platelets: 153 10*3/uL (ref 140–400)
RBC: 4.96 10*6/uL (ref 4.20–5.80)
RDW: 13 % (ref 11.0–15.0)
Total Lymphocyte: 22.3 %
WBC: 7.9 10*3/uL (ref 3.8–10.8)

## 2020-01-11 LAB — COMPLETE METABOLIC PANEL WITH GFR
AG Ratio: 1.2 (calc) (ref 1.0–2.5)
ALT: 24 U/L (ref 9–46)
AST: 20 U/L (ref 10–35)
Albumin: 3.9 g/dL (ref 3.6–5.1)
Alkaline phosphatase (APISO): 107 U/L (ref 35–144)
BUN/Creatinine Ratio: 17 (calc) (ref 6–22)
BUN: 30 mg/dL — ABNORMAL HIGH (ref 7–25)
CO2: 26 mmol/L (ref 20–32)
Calcium: 9.4 mg/dL (ref 8.6–10.3)
Chloride: 99 mmol/L (ref 98–110)
Creat: 1.77 mg/dL — ABNORMAL HIGH (ref 0.70–1.18)
GFR, Est African American: 44 mL/min/{1.73_m2} — ABNORMAL LOW (ref >=60)
GFR, Est Non African American: 38 mL/min/{1.73_m2} — ABNORMAL LOW (ref >=60)
Globulin: 3.2 g/dL (calc) (ref 1.9–3.7)
Glucose, Bld: 380 mg/dL — ABNORMAL HIGH (ref 65–99)
Potassium: 4.7 mmol/L (ref 3.5–5.3)
Sodium: 134 mmol/L — ABNORMAL LOW (ref 135–146)
Total Bilirubin: 0.6 mg/dL (ref 0.2–1.2)
Total Protein: 7.1 g/dL (ref 6.1–8.1)

## 2020-01-11 LAB — HEMOGLOBIN A1C
Hgb A1c MFr Bld: 12.9 % of total Hgb — ABNORMAL HIGH (ref <5.7)
Mean Plasma Glucose: 324 (calc)
eAG (mmol/L): 17.9 (calc)

## 2020-01-11 LAB — LIPID PANEL
Cholesterol: 218 mg/dL — ABNORMAL HIGH (ref <200)
HDL: 52 mg/dL (ref >=40)
LDL Cholesterol (Calc): 124 mg/dL (calc) — ABNORMAL HIGH
Non-HDL Cholesterol (Calc): 166 mg/dL (calc) — ABNORMAL HIGH (ref <130)
Total CHOL/HDL Ratio: 4.2 (calc) (ref <5.0)
Triglycerides: 274 mg/dL — ABNORMAL HIGH (ref <150)

## 2020-01-11 LAB — TSH: TSH: 4.18 mIU/L (ref 0.40–4.50)

## 2020-01-11 MED ORDER — EZETIMIBE 10 MG PO TABS: 10.0000 mg | ORAL_TABLET | Freq: Every day | ORAL | 0 refills | Status: DC

## 2020-01-11 MED ORDER — REPATHA 140 MG/ML ~~LOC~~ SOSY: 140.0000 mg | PREFILLED_SYRINGE | SUBCUTANEOUS | 3 refills | Status: DC

## 2020-01-11 MED ORDER — CLOPIDOGREL BISULFATE 75 MG PO TABS: 75.0000 mg | ORAL_TABLET | Freq: Every day | ORAL | 3 refills | Status: DC

## 2020-01-11 NOTE — Patient Instructions (Addendum)
Fasting cholesterol on 02/08/2020 Please remind the patient to bring the medication bottles at the next office visit.

## 2020-01-11 NOTE — Progress Notes (Signed)
David Burton Date of Birth: 1945/08/13 MRN: [id number] Primary Care Provider:[name], [name], NP Former Cardiology Providers: Dr. Adrian Prows, Jeri Lager, APRN, FNP-C Primary Cardiologist: [name], DO, [facility] (established care 01/11/2020)  Date: 01/11/2020 Last Visit: 05/10/2019  Chief Complaint  Patient presents with  . Coronary Artery Disease  . Hypertension  . Follow-up    6 month    HPI  David Burton is a 74 y.o.  male who presents to the office with a chief complaint of " heart disease management." Patient's past medical history and cardiovascular risk factors include: NSTEMI in Dec 2018, s/p CABG x 4 on 05/08/2017 (LIMA to LAD, SVG to PDA, SVG to Diagonal, SVG to Ramus. Surgery done at [facility]), diabetes mellitus type 2 with peripheral neuropathy, hypertension, hypercholesterolemia, CVA (09/2019), advanced age.  Patient was last seen in the office by Miquel Dunn back in December 2020 and was recommended to follow-up in 6 months.  I am seeing him for the first time for the above-mentioned chief complaint.  Since last office visit patient states that he is doing well from a cardiovascular standpoint.  However unfortunately he had a stroke in April 2021.  His presenting symptoms were difficulty in ambulation.  Patient was hospitalized for thorough work-up, later transitioned to inpatient rehab for 2 weeks, and now follows up with neurology.  Patient continues to have residual left-sided leg weakness but states that is overall improving.  During his recent hospitalization in April 2021 patient did have an echocardiogram as well as a carotid duplex.  Results reviewed and noted below for further reference.  From a cardiovascular standpoint patient states that he does not have any active chest pain at rest or with effort related activities.  He continues to walk 0.5 miles per day.  He uses nitroglycerin as needed basis and his last usage was in July 2021.  The frequency at  which she required sublingual nitroglycerin tablets for pain relief is approximately once a month which is quite stable for him when compared to prior episodes.  FUNCTIONAL STATUS: He walks atleast 0.5 miles per day.    ALLERGIES: No Known Allergies  MEDICATION LIST PRIOR TO VISIT: Current Outpatient Medications on File Prior to Visit  Medication Sig Dispense Refill  . amLODipine (NORVASC) 5 MG tablet Take 1 tablet by mouth daily.    Marland Kitchen aspirin EC 81 MG tablet Take 1 tablet (81 mg total) by mouth daily. 30 tablet 0  . bisacodyl (DULCOLAX) 10 MG suppository Place 10 mg rectally daily as needed for moderate constipation.    Marland Kitchen HYDROcodone-acetaminophen (NORCO) 10-325 MG tablet Take 1 tablet by mouth 3 (three) times daily as needed for up to 30 doses for moderate pain. 90 tablet 0  . insulin aspart (NOVOLOG) 100 UNIT/ML injection Inject 0-9 Units into the skin 3 (three) times daily with meals. 10 mL 0  . insulin detemir (LEVEMIR) 100 UNIT/ML FlexPen Inject 20 Units into the skin at bedtime.    Marland Kitchen losartan (COZAAR) 25 MG tablet TAKE 1/2 TABLET(12.5 MG) BY MOUTH DAILY 45 tablet 1  . nitroGLYCERIN (NITROSTAT) 0.4 MG SL tablet Place 1 tablet (0.4 mg total) under the tongue every 5 (five) minutes as needed for chest pain. 15 tablet 0  . rosuvastatin (CRESTOR) 40 MG tablet Take 1 tablet (40 mg total) by mouth daily at 6 PM. 90 tablet 1  . senna (SENOKOT) 8.6 MG TABS tablet Take 1 tablet (8.6 mg total) by mouth daily. 120 tablet 5  .  SODIUM PHOSPHATES RE Place 1 each rectally daily as needed (Constipation not relieved by Bisacodyl).    . TRULICITY 2.58 NI/7.7OE SOPN ADMINISTER 0.75 MG UNDER THE SKIN 1 TIME A WEEK 2 mL 5   No current facility-administered medications on file prior to visit.    PAST MEDICAL HISTORY: Past Medical History:  Diagnosis Date  . Bell palsy   . CKD (chronic kidney disease), stage III 12/16/2018  . Coronary artery disease involving native coronary artery of native heart with  unstable angina pectoris ([place]) 12/16/2018  . GERD (gastroesophageal reflux disease)   . Hypertension   . Hypertension with heart disease 12/16/2018  . Myocardial infarction ([place])   . Pure hypercholesterolemia 12/16/2018  . Stroke ([place])   . Type 2 diabetes, controlled, with peripheral neuropathy ([place]) 12/16/2018    PAST SURGICAL HISTORY: Past Surgical History:  Procedure Laterality Date  . CARDIAC SURGERY    . COLONOSCOPY WITH PROPOFOL N/A 01/02/2018   Procedure: COLONOSCOPY WITH PROPOFOL;  Surgeon: Carol Ada, MD;  Location: [facility];  Service: Endoscopy;  Laterality: N/A;  . CORONARY STENT INTERVENTION N/A 01/26/2019   Procedure: CORONARY STENT INTERVENTION;  Surgeon: Adrian Prows, MD;  Location: [facility];  Service: Cardiovascular;  Laterality: N/A;  . LEFT HEART CATH AND CORS/GRAFTS ANGIOGRAPHY N/A 01/05/2019   Procedure: LEFT HEART CATH AND CORS/GRAFTS ANGIOGRAPHY;  Surgeon: Adrian Prows, MD;  Location: [facility];  Service: Cardiovascular;  Laterality: N/A;  . POLYPECTOMY  01/02/2018   Procedure: POLYPECTOMY;  Surgeon: Carol Ada, MD;  Location: [facility];  Service: Endoscopy;;  . stents      FAMILY HISTORY: The patient's family history includes Cancer in his mother; Heart attack in his father.   SOCIAL HISTORY:  The patient  reports that he quit smoking about 16 years ago. His smoking use included cigarettes. He has a 5.00 pack-year smoking history. He has never used smokeless tobacco. He reports previous alcohol use. He reports that he does not use drugs.  Review of Systems  Constitutional: Negative for chills and fever.  HENT: Negative for hoarse voice and nosebleeds.   Eyes: Negative for discharge, double vision and pain.  Cardiovascular: Positive for dyspnea on exertion (stable). Negative for chest pain, claudication, leg swelling, near-syncope, orthopnea, palpitations, paroxysmal nocturnal dyspnea and syncope.  Respiratory: Negative for hemoptysis and  shortness of breath.   Musculoskeletal: Negative for muscle cramps and myalgias.  Gastrointestinal: Negative for abdominal pain, constipation, diarrhea, hematemesis, hematochezia, melena, nausea and vomiting.  Neurological: Positive for focal weakness (left leg weakness (residual from recent CVA) improving. ). Negative for dizziness and light-headedness.   PHYSICAL EXAM: Vitals with BMI 01/14/2020 01/11/2020 10/08/2019  Height 5\' 6"  5\' 6"  5\' 6"   Weight 244 lbs 6 oz 254 lbs 252 lbs  BMI 39.47 42.35 36.14  Systolic 431 540 086  Diastolic 84 77 80  Pulse 69 76 59   CONSTITUTIONAL: Well-developed and well-nourished. No acute distress.  SKIN: Skin is warm and dry. No rash noted. No cyanosis. No pallor. No jaundice HEAD: Normocephalic and atraumatic.  EYES: No scleral icterus MOUTH/THROAT: Moist oral membranes.  NECK: No JVD present. No thyromegaly noted. No carotid bruits  LYMPHATIC: No visible cervical adenopathy.  CHEST Normal respiratory effort. No intercostal retractions  LUNGS: Clear to auscultation bilaterally.  No stridor. No wheezes. No rales.  CARDIOVASCULAR: Regular rate and rhythm, positive S1-S2, no murmurs rubs or gallops appreciated. ABDOMINAL: No apparent ascites.  EXTREMITIES: No peripheral edema  HEMATOLOGIC: No significant bruising  NEUROLOGIC: Oriented to person, place, and time. Nonfocal. Normal muscle tone.  PSYCHIATRIC: Normal mood and affect. Normal behavior. Cooperative  RADIOLOGY: MR Brain w/o contrast:  IMPRESSION: 1. Mildly motion degraded exam. 2. Confirmed 17 mm acute/early subacute infarct within the right pons. 3. Background mild chronic small vessel ischemic disease within the cerebral white matter and pons. Tiny chronic lacunar infarct within the left cerebellum. 4. Mild generalized parenchymal atrophy. 5. Mild paranasal sinus mucosal thickening. Trace left mastoid effusion.  CARDIAC DATABASE: Coronary artery bypass grafting: s/p CABG x 4 on 05/08/2017  (LIMA to LAD, SVG to PDA, SVG to Diagonal, SVG to Ramus.  EKG: 01/11/2020: Normal sinus rhythm, 82 bpm, left axis deviation, left anterior fascicular block, incomplete right bundle branch block, old inferior infarct, without underlying ischemia or injury pattern.    Echocardiogram: 09/17/2019: LVEF 55 to 60%, no regional wall motion arise, grade 2 diastolic impairment, elevated LVEDP, right ventricular size mildly dilated, biatrial dilatation, trivial MR, aortic valve sclerosis without stenosis.  Stress Testing:  Lexiscan myoview stress test 10/10/2017: 1. Lexiscan stress test was performed. Exercise capacity was not assessed. Stress symptoms included dizziness. Normal blood pressure. The resting electrocardiogram demonstrated normal sinus rhythm, normal resting conduction, no resting arrhythmias and normal rest repolarization. Stress EKG is non diagnostic for ischemia as it is a pharmacologic stress. 2. The overall quality of the study is good. Left ventricular cavity is noted to be normal on the rest and stress studies. Gated SPECT images reveal normal myocardial thickening and wall motion, except mild inferior hypokinesis. The left ventricular ejection fraction was calculated or visually estimated to be 53%. SPECT stress images demonstrate medium size, moderate intensity perfusion defect in basal to apical inferior, inferolateral myocardium. SPECT rest images demonstrate medium size, mild intensity perfusion defect in basal to apical inferior myocardium. This suggests inferior infarct with periinfarct mild ischemia, as well as inferolateral ischemia. (LCx/PDA territory). 3. Intermediate risk study.  Heart Catheterization: Coronary angiogram 01/05/2019: Normal LVEDP, severe native vessel disease.  High-grade proximal RCA 90 to 95% stenosis followed by 70 to 80% stenosis in the in-stent restenotic proximal segment.  Large RCA.  SVG to RCA could not be visualized in spite of multiple catheter attempts,  and presumed occluded. Left main is large and mildly diseased but patent.  LAD is occluded in the midsegment after the origin of D1, which has ostial 90% stenosis.  SVG to D1 is patent.  LIMA to LAD is patent. Ramus intermediate: Large vessel, mild to moderate calcific 80 to 85% stenosis in the proximal segment.  SVG to ramus intermediate is occluded. Circumflex: High-grade 99%/subtotal occlusion in the ostium.  Moderate to large sized vessel.  Small marginals.  Coronary intervention 01/26/2019: Successful PTCA and stenting of the proximal mid and mid to distal RCA with implantation of 2 overlapping 3.0 x 38 mm Promus Synergy DES, stenosis reduced from 90% to 0% with TIMI-3 to TIMI-3 flow.    Recommendation: With medical high-grade ramus intermediate and circumflex stenosis, he has excellent brisk flow there and ischemia was in the inferior wall, however if he still persist to have recurrence of angina pectoris we will schedule him for complex intervention to the left coronary system.  In view of his underlying comorbidity, would recommend medical therapy  Carotid duplex: 09/17/2019: Right Carotid: Velocities in the right ICA are consistent with a 1-39% stenosis.  Left Carotid: Velocities in the left ICA are consistent with a 1-39% stenosis.  Vertebrals: Left vertebral artery demonstrates antegrade flow. Right vertebral  artery was not visualized  LABORATORY DATA: CBC Latest Ref Rng & Units 01/10/2020 09/21/2019 09/16/2019  WBC 3.8 - 10.8 Thousand/uL 7.9 8.7 9.6  Hemoglobin 13.2 - 17.1 g/dL 14.7 13.3 15.2  Hematocrit 38 - 50 % 44.3 39.4 45.2  Platelets 140 - 400 Thousand/uL 153 135(L) 165    CMP Latest Ref Rng & Units 01/10/2020 09/29/2019 09/21/2019  Glucose 65 - 99 mg/dL 380(H) - 220(H)  BUN 7 - 25 mg/dL 30(H) 28(A) 23  Creatinine 0.70 - 1.18 mg/dL 1.77(H) 1.7(A) 1.75(H)  Sodium 135 - 146 mmol/L 134(L) 135(A) 135  Potassium 3.5 - 5.3 mmol/L 4.7 4.5 4.5  Chloride 98 - 110 mmol/L 99 101 106    CO2 20 - 32 mmol/L 26 19 21(L)  Calcium 8.6 - 10.3 mg/dL 9.4 9.5 9.3  Total Protein 6.1 - 8.1 g/dL 7.1 - -  Total Bilirubin 0.2 - 1.2 mg/dL 0.6 - -  Alkaline Phos 38 - 126 U/L - - -  AST 10 - 35 U/L 20 - -  ALT 9 - 46 U/L 24 - -    Lipid Panel     Component Value Date/Time   CHOL 218 (H) 01/10/2020 0846   TRIG 274 (H) 01/10/2020 0846   HDL 52 01/10/2020 0846   CHOLHDL 4.2 01/10/2020 0846   VLDL 54 (H) 09/17/2019 0202   LDLCALC 124 (H) 01/10/2020 0846    Lab Results  Component Value Date   HGBA1C 12.9 (H) 01/10/2020   HGBA1C 10.8 (H) 09/17/2019   No components found for: NTPROBNP Lab Results  Component Value Date   TSH 4.18 01/10/2020    Cardiac Panel (last 3 results) No results for input(s): CKTOTAL, CKMB, TROPONINIHS, RELINDX in the last 72 hours.  IMPRESSION:    ICD-10-CM   1. Coronary artery disease involving native coronary artery of native heart without angina pectoris  I25.10 EKG 12-Lead    REPATHA 140 MG/ML SOSY  2. S/P CABG x 4  Z95.1 REPATHA 140 MG/ML SOSY  3. Hypertension with heart disease  I11.9   4. Right pontine stroke (HCC)  I63.50 REPATHA 140 MG/ML SOSY  5. Former smoker  Z87.891   3. Stage 3b chronic kidney disease  N18.32   7. History of stroke  Z86.73 clopidogrel (PLAVIX) 75 MG tablet  8. Pure hypercholesterolemia  E78.00 REPATHA 140 MG/ML SOSY    Lipid Panel With LDL/HDL Ratio    DISCONTINUED: ezetimibe (ZETIA) 10 MG tablet  9. S/P primary angioplasty with coronary stent  Z95.5 clopidogrel (PLAVIX) 75 MG tablet  10. Uncontrolled type 2 diabetes mellitus with peripheral neuropathy [facility])  E11.42    E11.65      RECOMMENDATIONS: David Burton is a 74 y.o. male whose past medical history and cardiovascular risk factors include: NSTEMI in Dec 2018, s/p CABG x 4 on 05/08/2017 (LIMA to LAD, SVG to PDA, SVG to Diagonal, SVG to Ramus. Surgery done at [facility]), diabetes mellitus type 2 with peripheral neuropathy, hypertension,  hypercholesterolemia, CVA (09/2019), advanced age.  Established coronary artery disease with prior history of four-vessel CABG:  Currently does not have any chest pain at rest or with effort related activity.  On average, patient does require 1 tablet of sublingual nitroglycerin tablets on a monthly basis for symptom relief this has been chronic and stable.  Most recent echocardiogram performed in April 2021 reviewed with the patient.  I had a detailed discussion with the patient in regards to improving his modifiable cardiovascular risk factors.  Patient's  A1c continues to be very uncontrolled trending compared to before.  Most recent hemoglobin A1c 12.9.  Patient is asked to follow-up with his primary care provider and even endocrinology if needed.  Patient's most recent lipid profile reviewed.  LDL is 124 mg/dL despite history of diabetes and CAD with CABG.  Triglycerides are not well controlled at 274.  Medications reconciled.  Initiate Repatha  Patient is asked to come to the office for nurse visit to be educated on the use of Repatha as it is an injectable medication  6 weeks follow-up fasting lipid profile ordered and released  History of CABG: See above  Recent stroke with residual deficits:  Patient is encouraged to follow-up with neurology as recommended.  Educated on the importance of secondary prevention by improving his modifiable cardiovascular risk factors as discussed above.  Patient noted to have atrial dilatation on most recent echocardiogram and with a history of recent stroke would recommend 30-day MCT to evaluate for underlying atrial fibrillation.  Pure hypercholesterolemia:  Continue Crestor and Zetia.  Initiate Repatha  Patient is asked to come to the office for nurse visit to be educated on the use of Repatha as it is an injectable medication  6 weeks follow-up fasting lipid profile ordered and released  Benign essential hypertension with chronic kidney  disease stage 3 . Office blood pressure is not at goal.  . Medication reconciled.  Marland Kitchen He is asked to keep a log of both blood pressure and pulse so that medications can be titrated based on a trend as opposed to isolated blood pressure readings in the office. . If the blood pressure is consistently greater than 156mmHg patient is asked to call the office or his primary care provider's office for medication titration sooner than the next office visit.  . Low salt diet recommended. A diet that is rich in fruits, vegetables, legumes, and low-fat dairy products and low in snacks, sweets, and meats (such as the Dietary Approaches to Stop Hypertension [DASH] diet).   Former smoker: Educated on the importance of continued smoking cessation.  Obesity, due to excess calories: . Body mass index is 41 kg/m. . I reviewed with the patient the importance of diet, regular physical activity/exercise, weight loss.   . Patient is educated on increasing physical activity gradually as tolerated.  With the goal of moderate intensity exercise for 30 minutes a day 5 days a week.  FINAL MEDICATION LIST END OF ENCOUNTER: Meds ordered this encounter  Medications  . clopidogrel (PLAVIX) 75 MG tablet    Sig: Take 1 tablet (75 mg total) by mouth daily.    Dispense:  90 tablet    Refill:  3  . REPATHA 140 MG/ML SOSY    Sig: Inject 140 mg into the skin every 14 (fourteen) days.    Dispense:  2.1 mL    Refill:  3  . DISCONTD: ezetimibe (ZETIA) 10 MG tablet    Sig: Take 1 tablet (10 mg total) by mouth daily.    Dispense:  90 tablet    Refill:  0     Current Outpatient Medications:  .  amLODipine (NORVASC) 5 MG tablet, Take 1 tablet by mouth daily., Disp: , Rfl:  .  aspirin EC 81 MG tablet, Take 1 tablet (81 mg total) by mouth daily., Disp: 30 tablet, Rfl: 0 .  bisacodyl (DULCOLAX) 10 MG suppository, Place 10 mg rectally daily as needed for moderate constipation., Disp: , Rfl:  .  HYDROcodone-acetaminophen (NORCO)  10-325 MG  tablet, Take 1 tablet by mouth 3 (three) times daily as needed for up to 30 doses for moderate pain., Disp: 90 tablet, Rfl: 0 .  insulin aspart (NOVOLOG) 100 UNIT/ML injection, Inject 0-9 Units into the skin 3 (three) times daily with meals., Disp: 10 mL, Rfl: 0 .  insulin detemir (LEVEMIR) 100 UNIT/ML FlexPen, Inject 20 Units into the skin at bedtime., Disp: , Rfl:  .  losartan (COZAAR) 25 MG tablet, TAKE 1/2 TABLET(12.5 MG) BY MOUTH DAILY, Disp: 45 tablet, Rfl: 1 .  nitroGLYCERIN (NITROSTAT) 0.4 MG SL tablet, Place 1 tablet (0.4 mg total) under the tongue every 5 (five) minutes as needed for chest pain., Disp: 15 tablet, Rfl: 0 .  rosuvastatin (CRESTOR) 40 MG tablet, Take 1 tablet (40 mg total) by mouth daily at 6 PM., Disp: 90 tablet, Rfl: 1 .  senna (SENOKOT) 8.6 MG TABS tablet, Take 1 tablet (8.6 mg total) by mouth daily., Disp: 120 tablet, Rfl: 5 .  SODIUM PHOSPHATES RE, Place 1 each rectally daily as needed (Constipation not relieved by Bisacodyl)., Disp: , Rfl:  .  TRULICITY 6.83 MH/9.6QI SOPN, ADMINISTER 0.75 MG UNDER THE SKIN 1 TIME A WEEK, Disp: 2 mL, Rfl: 5 .  clopidogrel (PLAVIX) 75 MG tablet, Take 1 tablet (75 mg total) by mouth daily., Disp: 90 tablet, Rfl: 3 .  ezetimibe (ZETIA) 10 MG tablet, Take 1 tablet (10 mg total) by mouth daily., Disp: 90 tablet, Rfl: 1 .  glucose blood (ACCU-CHEK AVIVA PLUS) test strip, 1 each by Other route daily. E11.42, Disp: 100 each, Rfl: 3 .  Ibuprofen (ADVIL) 200 MG CAPS, Take 1 capsule by mouth as needed., Disp: , Rfl:  .  Insulin Pen Needle 32G X 4 MM MISC, 1 pen by Does not apply route 3 (three) times daily., Disp: 90 each, Rfl: 3 .  isosorbide mononitrate (IMDUR) 60 MG 24 hr tablet, Take 1 tablet (60 mg total) by mouth daily., Disp: 90 tablet, Rfl: 1 .  metoprolol tartrate (LOPRESSOR) 25 MG tablet, TAKE 1/2 TABLET(12.5 MG) BY MOUTH TWICE DAILY, Disp: 90 tablet, Rfl: 1 .  REPATHA 140 MG/ML SOSY, Inject 140 mg into the skin every 14  (fourteen) days., Disp: 2.1 mL, Rfl: 3 .  Tdap (BOOSTRIX) 5-2.5-18.5 LF-MCG/0.5 injection, Inject 0.5 mLs into the muscle once., Disp: , Rfl:   Orders Placed This Encounter  Procedures  . Lipid Panel With LDL/HDL Ratio  . EKG 12-Lead   --Continue cardiac medications as reconciled in final medication list. --Return in about 7 weeks (around 02/29/2020) for CAD/Lipid follow-up.. Or sooner if needed. --Continue follow-up with your primary care physician regarding the management of your other chronic comorbid conditions.  Patient's questions and concerns were addressed to his satisfaction. He voices understanding of the instructions provided during this encounter.   During this visit I reviewed and updated: Tobacco history  allergies medication reconciliation  medical history  surgical history  family history  social history.  Total time spent: 45 minutes.  Reviewing prior records, records from his recent hospitalization, educating the patient on disease management, prescribing medications, ordering tests, and independently reviewed echocardiogram results, coordinating care.  This note was created using a voice recognition software as a result there may be grammatical errors inadvertently enclosed that do not reflect the nature of this encounter. Every attempt is made to correct such errors.  [name], [place], [facility]  Pager: [phone] Office: [phone]

## 2020-01-14 ENCOUNTER — Ambulatory Visit (INDEPENDENT_AMBULATORY_CARE_PROVIDER_SITE_OTHER): Payer: Medicare HMO | Admitting: Family

## 2020-01-14 ENCOUNTER — Encounter: Payer: Self-pay | Admitting: Family

## 2020-01-14 ENCOUNTER — Other Ambulatory Visit: Payer: Self-pay

## 2020-01-14 ENCOUNTER — Other Ambulatory Visit: Payer: Self-pay | Admitting: Cardiology

## 2020-01-14 VITALS — BP 138/84 | HR 69 | Temp 98.0°F | Resp 18 | Ht 66.0 in | Wt 244.4 lb

## 2020-01-14 DIAGNOSIS — E7800 Pure hypercholesterolemia, unspecified: Secondary | ICD-10-CM | POA: Diagnosis not present

## 2020-01-14 DIAGNOSIS — I25110 Atherosclerotic heart disease of native coronary artery with unstable angina pectoris: Secondary | ICD-10-CM

## 2020-01-14 DIAGNOSIS — Z1159 Encounter for screening for other viral diseases: Secondary | ICD-10-CM | POA: Diagnosis not present

## 2020-01-14 DIAGNOSIS — F1090 Alcohol use, unspecified, uncomplicated: Secondary | ICD-10-CM

## 2020-01-14 DIAGNOSIS — Z23 Encounter for immunization: Secondary | ICD-10-CM

## 2020-01-14 DIAGNOSIS — E871 Hypo-osmolality and hyponatremia: Secondary | ICD-10-CM

## 2020-01-14 DIAGNOSIS — E1142 Type 2 diabetes mellitus with diabetic polyneuropathy: Secondary | ICD-10-CM | POA: Diagnosis not present

## 2020-01-14 DIAGNOSIS — Z789 Other specified health status: Secondary | ICD-10-CM

## 2020-01-14 DIAGNOSIS — Z7289 Other problems related to lifestyle: Secondary | ICD-10-CM

## 2020-01-14 DIAGNOSIS — Z8673 Personal history of transient ischemic attack (TIA), and cerebral infarction without residual deficits: Secondary | ICD-10-CM

## 2020-01-14 DIAGNOSIS — I129 Hypertensive chronic kidney disease with stage 1 through stage 4 chronic kidney disease, or unspecified chronic kidney disease: Secondary | ICD-10-CM

## 2020-01-14 DIAGNOSIS — N183 Chronic kidney disease, stage 3 unspecified: Secondary | ICD-10-CM

## 2020-01-14 MED ORDER — ACCU-CHEK AVIVA PLUS VI STRP: 1.0000 | ORAL_STRIP | Freq: Every day | 3 refills | Status: DC

## 2020-01-14 MED ORDER — INSULIN PEN NEEDLE 32G X 4 MM MISC: 1.0000 "pen " | Freq: Three times a day (TID) | 3 refills | Status: DC

## 2020-01-14 MED ORDER — ISOSORBIDE MONONITRATE ER 60 MG PO TB24: 60.0000 mg | ORAL_TABLET | Freq: Every day | ORAL | 1 refills | Status: DC

## 2020-01-14 MED ORDER — METOPROLOL TARTRATE 25 MG PO TABS: ORAL_TABLET | ORAL | 1 refills | Status: DC

## 2020-01-14 MED ORDER — EZETIMIBE 10 MG PO TABS: 10.0000 mg | ORAL_TABLET | Freq: Every day | ORAL | 1 refills | Status: DC

## 2020-01-14 NOTE — Telephone Encounter (Signed)
Patient aware.

## 2020-01-14 NOTE — Progress Notes (Signed)
Provider: Marlowe Sax FNP-C   Natiya Seelinger, [name], NP  Patient Care Team: Anneliese Leblond, [name], NP as PCP - General (Family Medicine)  Extended Emergency Contact Information Primary Emergency Contact: Avedis, Bevis Mobile Phone: [phone] Relation: Daughter  Code Status:  Full Code  Goals of care: Advanced Directive information Advanced Directives 10/08/2019  Does Patient Have a Medical Advance Directive? Yes  Type of Advance Directive Out of facility DNR (pink MOST or yellow form)  Does patient want to make changes to medical advance directive? No - Patient declined  Copy of [place] in Chart? -  Would patient like information on creating a medical advance directive? -     Chief Complaint  Patient presents with  . Medical Management of Chronic Issues    3 month folow up.  Marland Kitchen Health Maintenance    Discuss the need for Hepatitis C Screening, Eye Exam, and Foot Exam.  . Immunizations    Discuss the need for PNA Vaccine, Influenza Vaccine, and Tetanus Vaccine.    HPI:  Pt is a 74 y.o. male seen today for  3 months follow up for medical management of chronic diseases.she is here with her daughter.   Type 2 DM- Latest Hgb A1C 12.9 no home CBG log for evaluation.States has been doing well.He denies any signs of hypo/hyperglycemia.He states has not been using his Trulicity weekly as prescribed.States was taught by home health Nurse but still not able to give self injection.Daughter states she was unaware that patient was not using Trulicity.will plan to visit at least over the weekend and administer trulicity injection.Not clear whether he uses his levemir and Novolog seems to be confused at times whether he gives himself injection.He states prefers an insulin pump.and libre   Alcohol use - Daughter states patient has started drinking alcohol again.states drinks 2-3 daily    CKD - CR 1.77,BUN 30 previous CR 1.75; 1.7   Hyponatremia - Na+ 134 previous Na+ 135     Hyperlipidemia - Cholesterol 218,TRG 274,LDL 124 on ezetimibe 10 mg tablet daily and rosuvastatin 40 mg tablet daily.low carbohydrates,low saturated fats and high vegetable diet discussed.His alcohol use also contributing to high calories.   Past Medical History:  Diagnosis Date  . Bell palsy   . CKD (chronic kidney disease), stage III 12/16/2018  . Coronary artery disease involving native coronary artery of native heart with unstable angina pectoris ([place]) 12/16/2018  . GERD (gastroesophageal reflux disease)   . Hypertension   . Hypertension with heart disease 12/16/2018  . Myocardial infarction ([place])   . Pure hypercholesterolemia 12/16/2018  . Stroke ([place])   . Type 2 diabetes, controlled, with peripheral neuropathy ([place]) 12/16/2018   Past Surgical History:  Procedure Laterality Date  . CARDIAC SURGERY    . COLONOSCOPY WITH PROPOFOL N/A 01/02/2018   Procedure: COLONOSCOPY WITH PROPOFOL;  Surgeon: Carol Ada, MD;  Location: [facility];  Service: Endoscopy;  Laterality: N/A;  . CORONARY STENT INTERVENTION N/A 01/26/2019   Procedure: CORONARY STENT INTERVENTION;  Surgeon: Adrian Prows, MD;  Location: [facility];  Service: Cardiovascular;  Laterality: N/A;  . LEFT HEART CATH AND CORS/GRAFTS ANGIOGRAPHY N/A 01/05/2019   Procedure: LEFT HEART CATH AND CORS/GRAFTS ANGIOGRAPHY;  Surgeon: Adrian Prows, MD;  Location: [facility] [facility];  Service: Cardiovascular;  Laterality: N/A;  . POLYPECTOMY  01/02/2018   Procedure: POLYPECTOMY;  Surgeon: Carol Ada, MD;  Location: [facility];  Service: Endoscopy;;  . stents      No Known Allergies  Allergies as of 01/14/2020   No Known Allergies     Medication List       Accurate as of January 14, 2020  8:48 AM. If you have any questions, ask your nurse or doctor.        STOP taking these medications   diclofenac Sodium 1 % Gel Commonly known as: VOLTAREN Stopped by: Sandrea Hughs, NP     TAKE these medications   Accu-Chek Aviva Plus  test strip Generic drug: glucose blood 1 each by Other route daily. E11.42   Advil 200 MG Caps Generic drug: Ibuprofen Take 1 capsule by mouth as needed.   amLODipine 5 MG tablet Commonly known as: NORVASC Take 1 tablet by mouth daily.   aspirin EC 81 MG tablet Take 1 tablet (81 mg total) by mouth daily.   bisacodyl 10 MG suppository Commonly known as: DULCOLAX Place 10 mg rectally daily as needed for moderate constipation.   clopidogrel 75 MG tablet Commonly known as: Plavix Take 1 tablet (75 mg total) by mouth daily.   ezetimibe 10 MG tablet Commonly known as: ZETIA Take 1 tablet (10 mg total) by mouth daily.   HYDROcodone-acetaminophen 10-325 MG tablet Commonly known as: NORCO Take 1 tablet by mouth 3 (three) times daily as needed for up to 30 doses for moderate pain.   insulin aspart 100 UNIT/ML injection Commonly known as: novoLOG Inject 0-9 Units into the skin 3 (three) times daily with meals.   insulin detemir 100 UNIT/ML FlexPen Commonly known as: LEVEMIR Inject 20 Units into the skin at bedtime.   Insulin Pen Needle 32G X 4 MM Misc 1 pen by Does not apply route 3 (three) times daily.   isosorbide mononitrate 60 MG 24 hr tablet Commonly known as: IMDUR Take 60 mg by mouth daily.   losartan 25 MG tablet Commonly known as: COZAAR TAKE 1/2 TABLET(12.5 MG) BY MOUTH DAILY   metoprolol tartrate 25 MG tablet Commonly known as: LOPRESSOR TAKE 1/2 TABLET(12.5 MG) BY MOUTH TWICE DAILY   nitroGLYCERIN 0.4 MG SL tablet Commonly known as: NITROSTAT Place 1 tablet (0.4 mg total) under the tongue every 5 (five) minutes as needed for chest pain.   Repatha 140 MG/ML Sosy Generic drug: Evolocumab Inject 140 mg into the skin every 14 (fourteen) days.   rosuvastatin 40 MG tablet Commonly known as: CRESTOR Take 1 tablet (40 mg total) by mouth daily at 6 PM.   senna 8.6 MG Tabs tablet Commonly known as: SENOKOT Take 1 tablet (8.6 mg total) by mouth daily.     SODIUM PHOSPHATES RE Place 1 each rectally daily as needed (Constipation not relieved by Bisacodyl).   Trulicity 3.84 YK/5.9DJ Sopn Generic drug: Dulaglutide ADMINISTER 0.75 MG UNDER THE SKIN 1 TIME A WEEK       Review of Systems  Constitutional: Negative for appetite change, chills, fatigue and fever.  HENT: Negative for congestion, postnasal drip, rhinorrhea, sinus pressure, sinus pain, sneezing, sore throat and trouble swallowing.   Eyes: Negative for discharge, redness and itching.  Respiratory: Negative for cough, chest tightness, shortness of breath and wheezing.   Cardiovascular: Negative for chest pain, palpitations and leg swelling.  Gastrointestinal: Negative for abdominal distention, abdominal pain, constipation, diarrhea, nausea and vomiting.  Endocrine: Negative for cold intolerance, heat intolerance, polydipsia, polyphagia and polyuria.  Genitourinary: Negative for difficulty urinating, dysuria, flank pain, frequency and urgency.  Musculoskeletal: Positive for arthralgias and gait problem. Negative for joint swelling and myalgias.  Skin: Negative for color  change, pallor and rash.  Neurological: Negative for dizziness, speech difficulty, weakness, light-headedness, numbness and headaches.  Hematological: Does not bruise/bleed easily.  Psychiatric/Behavioral: Negative for agitation, behavioral problems, decreased concentration and sleep disturbance. The patient is not nervous/anxious.     Immunization History  Administered Date(s) Administered  . PFIZER SARS-COV-2 Vaccination 08/13/2019, 09/06/2019   Pertinent  Health Maintenance Due  Topic Date Due  . FOOT EXAM  Never done  . OPHTHALMOLOGY EXAM  Never done  . PNA vac Low Risk Adult (1 of 2 - PCV13) Never done  . INFLUENZA VACCINE  01/02/2020  . HEMOGLOBIN A1C  07/12/2020  . COLONOSCOPY  01/03/2028   Fall Risk  10/08/2019  Falls in the past year? 0  Number falls in past yr: 0  Injury with Fall? 0       Vitals:   01/14/20 0832  BP: 138/84  Pulse: 69  Resp: 18  Temp: 98 F (36.7 C)  SpO2: 97%  Weight: 244 lb 6.4 oz (110.9 kg)  Height: 5' 6"  (1.676 m)   Body mass index is 39.45 kg/m. Physical Exam Vitals reviewed.  Constitutional:      General: He is not in acute distress.    Appearance: He is obese. He is not ill-appearing.  HENT:     Head: Normocephalic.     Right Ear: Tympanic membrane, ear canal and external ear normal. There is no impacted cerumen.     Left Ear: Tympanic membrane, ear canal and external ear normal. There is no impacted cerumen.     Nose: Nose normal. No congestion or rhinorrhea.     Mouth/Throat:     Mouth: Mucous membranes are moist.     Pharynx: Oropharynx is clear. No oropharyngeal exudate or posterior oropharyngeal erythema.  Eyes:     General: No scleral icterus.       Right eye: No discharge.        Left eye: No discharge.     Extraocular Movements: Extraocular movements intact.     Conjunctiva/sclera: Conjunctivae normal.     Pupils: Pupils are equal, round, and reactive to light.  Neck:     Vascular: No carotid bruit.  Cardiovascular:     Rate and Rhythm: Normal rate and regular rhythm.     Pulses: Normal pulses.     Heart sounds: Normal heart sounds. No murmur heard.  No friction rub. No gallop.   Pulmonary:     Effort: Pulmonary effort is normal. No respiratory distress.     Breath sounds: Normal breath sounds. No wheezing, rhonchi or rales.  Chest:     Chest wall: No tenderness.  Abdominal:     General: Bowel sounds are normal. There is no distension.     Palpations: Abdomen is soft. There is no mass.     Tenderness: There is no abdominal tenderness. There is no right CVA tenderness, left CVA tenderness, guarding or rebound.  Musculoskeletal:        General: No swelling or tenderness.     Cervical back: Normal range of motion. No rigidity or tenderness.     Right lower leg: No edema.     Left lower leg: No edema.     Comments:  Unsteady gait   Lymphadenopathy:     Cervical: No cervical adenopathy.  Skin:    General: Skin is warm.     Coloration: Skin is not pale.     Findings: No bruising, erythema or rash.  Neurological:     Mental  Status: He is alert and oriented to person, place, and time.     Cranial Nerves: No cranial nerve deficit.     Motor: No weakness.     Coordination: Coordination normal.     Gait: Gait abnormal.  Psychiatric:        Mood and Affect: Mood normal.        Behavior: Behavior normal.        Thought Content: Thought content normal.        Judgment: Judgment normal.    Labs reviewed: Recent Labs    09/20/19 1128 09/20/19 1128 09/21/19 0501 09/29/19 0000 01/10/20 0846  NA 138   < > 135 135* 134*  K 4.5   < > 4.5 4.5 4.7  CL 104   < > 106 101 99  CO2 26   < > 21* 19 26  GLUCOSE 179*  --  220*  --  380*  BUN 24*   < > 23 28* 30*  CREATININE 1.63*   < > 1.75* 1.7* 1.77*  CALCIUM 9.5   < > 9.3 9.5 9.4   < > = values in this interval not displayed.   Recent Labs    09/16/19 0835 01/10/20 0846  AST 17 20  ALT 22 24  ALKPHOS 87  --   BILITOT 1.0 0.6  PROT 7.6 7.1  ALBUMIN 3.8  --    Recent Labs    09/16/19 0835 09/21/19 0501 01/10/20 0846  WBC 9.6 8.7 7.9  NEUTROABS  --  5.6 5,356  HGB 15.2 13.3 14.7  HCT 45.2 39.4 44.3  MCV 89.0 88.1 89.3  PLT 165 135* 153   Lab Results  Component Value Date   TSH 4.18 01/10/2020   Lab Results  Component Value Date   HGBA1C 12.9 (H) 01/10/2020   Lab Results  Component Value Date   CHOL 218 (H) 01/10/2020   HDL 52 01/10/2020   LDLCALC 124 (H) 01/10/2020   TRIG 274 (H) 01/10/2020   CHOLHDL 4.2 01/10/2020    Significant Diagnostic Results in last 30 days:  No results found.  Assessment/Plan 1. Type 2 diabetes, controlled, with peripheral neuropathy (HCC) Lab Results  Component Value Date   HGBA1C 12.9 (H) 01/10/2020   - Advised to use Trulicity SQ as directed.daughter plans to visit over the weekend and  assist with administering injection. - continue on Levemir 20 units QHS and Novolog  - TSH; Future - Hemoglobin A1c; Future - Insulin Pen Needle 32G X 4 MM MISC; 1 pen by Does not apply route 3 (three) times daily.  Dispense: 90 each; Refill: 3 - metoprolol tartrate (LOPRESSOR) 25 MG tablet; TAKE 1/2 TABLET(12.5 MG) BY MOUTH TWICE DAILY  Dispense: 90 tablet; Refill: 1 - Ambulatory referral to Endocrinology - Ambulatory referral to diabetic education  2. Pure hypercholesterolemia LDL not at goal. Rebatha 140 mg every 14 days recently started by cardiologist.Has not pickup script yet. - continue on ezetimibe,rosuvastatin and Rebatha. - ezetimibe (ZETIA) 10 MG tablet; Take 1 tablet (10 mg total) by mouth daily.  Dispense: 90 tablet; Refill: 1 - continue to follow up with Cardiologist as directed.   3. Coronary artery disease involving native coronary artery of native heart with unstable angina pectoris (HCC) Chest pain free.continue to control high risk factors.  - isosorbide mononitrate (IMDUR) 60 MG 24 hr tablet; Take 1 tablet (60 mg total) by mouth daily.  Dispense: 90 tablet; Refill: 1 - metoprolol tartrate (LOPRESSOR) 25 MG tablet; TAKE 1/2 TABLET(12.5  MG) BY MOUTH TWICE DAILY  Dispense: 90 tablet; Refill: 1  4. History of stroke Continue on losartan,metorprolol,imdur and amlodipine  - isosorbide mononitrate (IMDUR) 60 MG 24 hr tablet; Take 1 tablet (60 mg total) by mouth daily.  Dispense: 90 tablet; Refill: 1  5. Benign hypertension with chronic kidney disease, stage III B/p at goal.CR at baseline.  Continue on losartan,metorprolol,imdur and amlodipine - CBC with Differential/Platelet; Future - CMP with eGFR([facility]); Future - Lipid panel; Future - metoprolol tartrate (LOPRESSOR) 25 MG tablet; TAKE 1/2 TABLET(12.5 MG) BY MOUTH TWICE DAILY  Dispense: 90 tablet; Refill: 1  6. Encounter for hepatitis C screening test for low risk patient asymptomatic - Hep C Antibody; Future  7.  Need for vaccination against Streptococcus pneumoniae using pneumococcal conjugate vaccine 13 Afebrile. - Pneumococcal conjugate vaccine 13-valent IM administered today by CMA side effect discussed and additional education vaccine information given  8.Alcohol use  Drinks 2-3 beers unclear daily.Alcohol cessation advised verbalized understanding.  9.Hyponatremia   Na+ 134 previous 135  Suspect due to alcohol intake.Alcohol cessation advised Not on diuretic.will continue to monitor.   Family/ staff Communication: Reviewed plan of care with patient and daughter verbalized understanding.   Labs/tests ordered:  - TSH; Future - Hemoglobin A1c; Future - Hep C Antibody; Future - CBC with Differential/Platelet; Future - CMP with eGFR([facility]); Future - Lipid panel; Future  Next Appointment : 1 month for evaluation of blood sugars and fasting labs in 4 months.   Time spent with patient 40 minutes >50% time spent counseling; reviewing medical record; tests; labs; and developing future plan of care   Sandrea Hughs, NP

## 2020-01-15 LAB — LIPID PANEL WITH LDL/HDL RATIO
Cholesterol, Total: 222 mg/dL — ABNORMAL HIGH (ref 100–199)
HDL: 40 mg/dL (ref >39)
LDL Chol Calc (NIH): 136 mg/dL — ABNORMAL HIGH (ref 0–99)
LDL/HDL Ratio: 3.4 ratio (ref 0.0–3.6)
Triglycerides: 255 mg/dL — ABNORMAL HIGH (ref 0–149)
VLDL Cholesterol Cal: 46 mg/dL — ABNORMAL HIGH (ref 5–40)

## 2020-01-19 ENCOUNTER — Ambulatory Visit: Payer: Medicare HMO | Admitting: Dietician

## 2020-01-21 ENCOUNTER — Other Ambulatory Visit: Payer: Self-pay | Admitting: Cardiology

## 2020-01-21 DIAGNOSIS — Z951 Presence of aortocoronary bypass graft: Secondary | ICD-10-CM

## 2020-01-21 DIAGNOSIS — Z955 Presence of coronary angioplasty implant and graft: Secondary | ICD-10-CM

## 2020-01-21 DIAGNOSIS — I2510 Atherosclerotic heart disease of native coronary artery without angina pectoris: Secondary | ICD-10-CM

## 2020-01-21 DIAGNOSIS — Z8673 Personal history of transient ischemic attack (TIA), and cerebral infarction without residual deficits: Secondary | ICD-10-CM

## 2020-01-21 DIAGNOSIS — E7800 Pure hypercholesterolemia, unspecified: Secondary | ICD-10-CM

## 2020-01-21 NOTE — Progress Notes (Signed)
appt

## 2020-01-25 ENCOUNTER — Other Ambulatory Visit: Payer: Self-pay | Admitting: *Deleted

## 2020-01-25 ENCOUNTER — Ambulatory Visit: Payer: Medicare HMO | Admitting: Cardiology

## 2020-01-25 ENCOUNTER — Other Ambulatory Visit: Payer: Self-pay

## 2020-01-25 DIAGNOSIS — E1142 Type 2 diabetes mellitus with diabetic polyneuropathy: Secondary | ICD-10-CM

## 2020-01-25 DIAGNOSIS — E7800 Pure hypercholesterolemia, unspecified: Secondary | ICD-10-CM

## 2020-01-25 MED ORDER — INSULIN DETEMIR 100 UNIT/ML FLEXPEN: 20.0000 [IU] | PEN_INJECTOR | Freq: Every day | SUBCUTANEOUS | 5 refills | Status: DC

## 2020-01-25 MED ORDER — EVOLOCUMAB 140 MG/ML ~~LOC~~ SOAJ
140.0000 mg | Freq: Once | SUBCUTANEOUS | Status: AC
  Administered 2020-01-25: 140 mg via SUBCUTANEOUS

## 2020-01-25 MED ORDER — ACCU-CHEK AVIVA PLUS VI STRP: ORAL_STRIP | 1 refills | Status: AC

## 2020-01-25 NOTE — Progress Notes (Signed)
Administration Action Time Recorded Time Documented By Site Comment Reason Patient Supplied  Given : 140 mg :  : Subcutaneous 01/25/20 1214 01/25/20 1214 Mares, Lesley Right Lower Abdomen   No     ICD-10-CM   1. Pure hypercholesterolemia  E78.00 Evolocumab SOAJ 140 mg    Adrian Prows, MD, [facility] 01/25/2020, 3:08 PM Office: [phone]

## 2020-01-25 NOTE — Telephone Encounter (Signed)
Daughter requested refills.  ?

## 2020-01-30 DIAGNOSIS — E871 Hypo-osmolality and hyponatremia: Secondary | ICD-10-CM | POA: Insufficient documentation

## 2020-01-30 DIAGNOSIS — Z789 Other specified health status: Secondary | ICD-10-CM | POA: Insufficient documentation

## 2020-01-30 DIAGNOSIS — Z7289 Other problems related to lifestyle: Secondary | ICD-10-CM | POA: Insufficient documentation

## 2020-01-30 DIAGNOSIS — Z8673 Personal history of transient ischemic attack (TIA), and cerebral infarction without residual deficits: Secondary | ICD-10-CM | POA: Insufficient documentation

## 2020-02-01 ENCOUNTER — Telehealth: Payer: Self-pay

## 2020-02-01 NOTE — Telephone Encounter (Signed)
I have checked fax since original message received and checked with the administrative staff, no incoming fax received on patient.  I called [facility] back and left a message on the nurses line informing them we are still awaiting fax.

## 2020-02-01 NOTE — Telephone Encounter (Signed)
Awaiting for orders to sign

## 2020-02-01 NOTE — Telephone Encounter (Signed)
Calvin with [facility] called stating he originally sent an order request (via fax) for patient on 12/15/2019 and has yet to receive the order back. Kerry Dory has spoken with Lattie Haw and Lovena Le about these orders .  I was unable to locate orders in Epic or in Dinah's review and sign folder. I asked Calvin to resend orders to my attention and I will assure that Dinah signs.  I informed Kerry Dory if orders are not sent today the next day that I will be able to get Dinah to sign will be Friday 02/04/2020  Awaiting fax

## 2020-02-03 NOTE — Telephone Encounter (Signed)
Form signed and handed to suzette for billing.

## 2020-02-03 NOTE — Telephone Encounter (Signed)
Form received and in Dinah's review and sign folder

## 2020-02-08 ENCOUNTER — Other Ambulatory Visit: Payer: Self-pay | Admitting: *Deleted

## 2020-02-08 DIAGNOSIS — M7022 Olecranon bursitis, left elbow: Secondary | ICD-10-CM

## 2020-02-08 DIAGNOSIS — G894 Chronic pain syndrome: Secondary | ICD-10-CM

## 2020-02-08 DIAGNOSIS — M8949 Other hypertrophic osteoarthropathy, multiple sites: Secondary | ICD-10-CM

## 2020-02-08 DIAGNOSIS — M159 Polyosteoarthritis, unspecified: Secondary | ICD-10-CM

## 2020-02-08 MED ORDER — HYDROCODONE-ACETAMINOPHEN 10-325 MG PO TABS: 1.0000 | ORAL_TABLET | Freq: Three times a day (TID) | ORAL | 0 refills | Status: DC | PRN

## 2020-02-08 NOTE — Telephone Encounter (Signed)
Patient daughter requested refill.  Contract on file Epic LR: 01/07/2020 Pended Rx and sent to [facility] for approval.

## 2020-02-15 ENCOUNTER — Ambulatory Visit: Payer: Medicare HMO | Admitting: Family

## 2020-02-18 ENCOUNTER — Telehealth: Payer: Self-pay

## 2020-02-18 NOTE — Telephone Encounter (Signed)
Patient called and states that he's been out of town which is why he hasn't been able to be contacted. Patient states that his daughter has reached out to Nutritionist and set up appointment. Patient states that his blood sugars are under control so appointment was cancelled. Patient states that he will come into his appointment for fasting labs on 03/02/2020 and follow up at his office appointment on 03/07/2020.

## 2020-03-02 ENCOUNTER — Other Ambulatory Visit: Payer: Self-pay

## 2020-03-02 ENCOUNTER — Ambulatory Visit: Payer: Medicare HMO | Admitting: Cardiology

## 2020-03-02 ENCOUNTER — Other Ambulatory Visit: Payer: Medicare HMO

## 2020-03-03 LAB — HEPATITIS C ANTIBODY
Hepatitis C Ab: NONREACTIVE
SIGNAL TO CUT-OFF: 0.04 (ref <1.00)

## 2020-03-03 LAB — CBC WITH DIFFERENTIAL/PLATELET
Absolute Monocytes: 836 cells/uL (ref 200–950)
Basophils Absolute: 53 cells/uL (ref 0–200)
Basophils Relative: 0.6 %
Eosinophils Absolute: 97 cells/uL (ref 15–500)
Eosinophils Relative: 1.1 %
HCT: 45.8 % (ref 38.5–50.0)
Hemoglobin: 15.2 g/dL (ref 13.2–17.1)
Lymphs Abs: 3036 cells/uL (ref 850–3900)
MCH: 29.7 pg (ref 27.0–33.0)
MCHC: 33.2 g/dL (ref 32.0–36.0)
MCV: 89.6 fL (ref 80.0–100.0)
MPV: 10.8 fL (ref 7.5–12.5)
Monocytes Relative: 9.5 %
Neutro Abs: 4778 cells/uL (ref 1500–7800)
Neutrophils Relative %: 54.3 %
Platelets: 163 10*3/uL (ref 140–400)
RBC: 5.11 10*6/uL (ref 4.20–5.80)
RDW: 14.8 % (ref 11.0–15.0)
Total Lymphocyte: 34.5 %
WBC: 8.8 10*3/uL (ref 3.8–10.8)

## 2020-03-03 LAB — COMPLETE METABOLIC PANEL WITH GFR
AG Ratio: 1.2 (calc) (ref 1.0–2.5)
ALT: 59 U/L — ABNORMAL HIGH (ref 9–46)
AST: 42 U/L — ABNORMAL HIGH (ref 10–35)
Albumin: 3.8 g/dL (ref 3.6–5.1)
Alkaline phosphatase (APISO): 87 U/L (ref 35–144)
BUN/Creatinine Ratio: 21 (calc) (ref 6–22)
BUN: 30 mg/dL — ABNORMAL HIGH (ref 7–25)
CO2: 23 mmol/L (ref 20–32)
Calcium: 9.2 mg/dL (ref 8.6–10.3)
Chloride: 108 mmol/L (ref 98–110)
Creat: 1.43 mg/dL — ABNORMAL HIGH (ref 0.70–1.18)
GFR, Est African American: 56 mL/min/{1.73_m2} — ABNORMAL LOW (ref >=60)
GFR, Est Non African American: 49 mL/min/{1.73_m2} — ABNORMAL LOW (ref >=60)
Globulin: 3.2 g/dL (calc) (ref 1.9–3.7)
Glucose, Bld: 103 mg/dL — ABNORMAL HIGH (ref 65–99)
Potassium: 4.6 mmol/L (ref 3.5–5.3)
Sodium: 139 mmol/L (ref 135–146)
Total Bilirubin: 0.4 mg/dL (ref 0.2–1.2)
Total Protein: 7 g/dL (ref 6.1–8.1)

## 2020-03-03 LAB — LIPID PANEL
Cholesterol: 128 mg/dL (ref <200)
HDL: 49 mg/dL (ref >=40)
LDL Cholesterol (Calc): 63 mg/dL (calc)
Non-HDL Cholesterol (Calc): 79 mg/dL (calc) (ref <130)
Total CHOL/HDL Ratio: 2.6 (calc) (ref <5.0)
Triglycerides: 84 mg/dL (ref <150)

## 2020-03-03 LAB — HEMOGLOBIN A1C
Hgb A1c MFr Bld: 8.8 % of total Hgb — ABNORMAL HIGH (ref <5.7)
Mean Plasma Glucose: 206 (calc)
eAG (mmol/L): 11.4 (calc)

## 2020-03-03 LAB — TSH: TSH: 4.56 mIU/L — ABNORMAL HIGH (ref 0.40–4.50)

## 2020-03-05 ENCOUNTER — Other Ambulatory Visit: Payer: Self-pay | Admitting: Adult Health

## 2020-03-05 DIAGNOSIS — I2510 Atherosclerotic heart disease of native coronary artery without angina pectoris: Secondary | ICD-10-CM

## 2020-03-07 ENCOUNTER — Encounter: Payer: Self-pay | Admitting: Family

## 2020-03-07 ENCOUNTER — Ambulatory Visit (INDEPENDENT_AMBULATORY_CARE_PROVIDER_SITE_OTHER): Payer: Medicare HMO | Admitting: Family

## 2020-03-07 ENCOUNTER — Other Ambulatory Visit: Payer: Self-pay

## 2020-03-07 VITALS — BP 150/90 | HR 78 | Temp 97.7°F | Resp 18 | Ht 66.0 in | Wt 250.2 lb

## 2020-03-07 DIAGNOSIS — K219 Gastro-esophageal reflux disease without esophagitis: Secondary | ICD-10-CM | POA: Diagnosis not present

## 2020-03-07 DIAGNOSIS — E1142 Type 2 diabetes mellitus with diabetic polyneuropathy: Secondary | ICD-10-CM | POA: Diagnosis not present

## 2020-03-07 DIAGNOSIS — I129 Hypertensive chronic kidney disease with stage 1 through stage 4 chronic kidney disease, or unspecified chronic kidney disease: Secondary | ICD-10-CM | POA: Diagnosis not present

## 2020-03-07 DIAGNOSIS — M159 Polyosteoarthritis, unspecified: Secondary | ICD-10-CM

## 2020-03-07 DIAGNOSIS — E7800 Pure hypercholesterolemia, unspecified: Secondary | ICD-10-CM

## 2020-03-07 DIAGNOSIS — N183 Chronic kidney disease, stage 3 unspecified: Secondary | ICD-10-CM

## 2020-03-07 DIAGNOSIS — M8949 Other hypertrophic osteoarthropathy, multiple sites: Secondary | ICD-10-CM

## 2020-03-07 DIAGNOSIS — R748 Abnormal levels of other serum enzymes: Secondary | ICD-10-CM

## 2020-03-07 DIAGNOSIS — G894 Chronic pain syndrome: Secondary | ICD-10-CM

## 2020-03-07 DIAGNOSIS — M7022 Olecranon bursitis, left elbow: Secondary | ICD-10-CM

## 2020-03-07 MED ORDER — HYDROCODONE-ACETAMINOPHEN 10-325 MG PO TABS: 1.0000 | ORAL_TABLET | Freq: Three times a day (TID) | ORAL | 0 refills | Status: DC | PRN

## 2020-03-07 NOTE — Progress Notes (Signed)
Provider: Marlowe Sax FNP-C   Jadyn Barge, [name], NP  Patient Care Team: Taelon Bendorf, [name], NP as PCP - General (Family Medicine)  Extended Emergency Contact Information Primary Emergency Contact: Jencarlos, Nicolson Mobile Phone: [phone] Relation: Daughter  Code Status: DNR Goals of care: Advanced Directive information Advanced Directives 03/07/2020  Does Patient Have a Medical Advance Directive? Yes  Type of Advance Directive Out of facility DNR (pink MOST or yellow form)  Does patient want to make changes to medical advance directive? No - Patient declined  Copy of [place] in Chart? -  Would patient like information on creating a medical advance directive? -     Chief Complaint  Patient presents with  . Medical Management of Chronic Issues    1 Month Follow Up.    HPI:  Pt is a 74 y.o. male seen today for medical management of chronic diseases.Has medical history of Hypertension,type 2 DM with Neuropathy,CKd stage 3,Hypercholesterolemia,CAD,Alcohol use,GERD,Hx stroke,MI among other conditions.He is here alone today states daughter had to work today. He states has upcoming procedure for removal of several teeth then will have a plate.He states would like to make provider aware in case the Dentist has to give him pain medication.He is concerned since he signed a Narcotic contract here.I've discussed with him that unless different pain medication is prescribed other than what he is currently on.His Norco should be appropriate for his pain management.   He states feeling really well blood sugars have been running in the 100's-130's except had one episode in the 70's.He didn't bring his log to visit today.Hgb A1C has improved down from 12.9 to 8.8 Has been using his medication as directed.   Has cut down on drinking beer.His recent liver enzymes was high AST 42,ALT 59.He denies any right upper Quadrant pain.  His TSH level slightly high on the upper normal  range.will continue to monitor.Again he states feeling really good and energetic. No palpitation.    Past Medical History:  Diagnosis Date  . Bell palsy   . CKD (chronic kidney disease), stage III ([place]) 12/16/2018  . Coronary artery disease involving native coronary artery of native heart with unstable angina pectoris ([place]) 12/16/2018  . GERD (gastroesophageal reflux disease)   . Hypertension   . Hypertension with heart disease 12/16/2018  . Myocardial infarction ([place])   . Pure hypercholesterolemia 12/16/2018  . Stroke ([place])   . Type 2 diabetes, controlled, with peripheral neuropathy ([place]) 12/16/2018   Past Surgical History:  Procedure Laterality Date  . CARDIAC SURGERY    . COLONOSCOPY WITH PROPOFOL N/A 01/02/2018   Procedure: COLONOSCOPY WITH PROPOFOL;  Surgeon: Carol Ada, MD;  Location: [facility];  Service: Endoscopy;  Laterality: N/A;  . CORONARY STENT INTERVENTION N/A 01/26/2019   Procedure: CORONARY STENT INTERVENTION;  Surgeon: Adrian Prows, MD;  Location: [place];  Service: Cardiovascular;  Laterality: N/A;  . LEFT HEART CATH AND CORS/GRAFTS ANGIOGRAPHY N/A 01/05/2019   Procedure: LEFT HEART CATH AND CORS/GRAFTS ANGIOGRAPHY;  Surgeon: Adrian Prows, MD;  Location: [place] [facility];  Service: Cardiovascular;  Laterality: N/A;  . POLYPECTOMY  01/02/2018   Procedure: POLYPECTOMY;  Surgeon: Carol Ada, MD;  Location: [facility];  Service: Endoscopy;;  . stents      No Known Allergies  Allergies as of 03/07/2020   No Known Allergies     Medication List       Accurate as of March 07, 2020  8:31 AM. If you have any questions,  ask your nurse or doctor.        Accu-Chek Aviva Plus test strip Generic drug: glucose blood Use to test blood sugar 3 times daily. Dx: E11.42   Advil 200 MG Caps Generic drug: Ibuprofen Take 1 capsule by mouth as needed.   amLODipine 5 MG tablet Commonly known as: NORVASC Take 1 tablet by mouth daily.   bisacodyl 10 MG  suppository Commonly known as: DULCOLAX Place 10 mg rectally daily as needed for moderate constipation.   clopidogrel 75 MG tablet Commonly known as: Plavix Take 1 tablet (75 mg total) by mouth daily.   ezetimibe 10 MG tablet Commonly known as: ZETIA Take 1 tablet (10 mg total) by mouth daily.   HYDROcodone-acetaminophen 10-325 MG tablet Commonly known as: NORCO Take 1 tablet by mouth 3 (three) times daily as needed for up to 30 doses for moderate pain.   insulin aspart 100 UNIT/ML injection Commonly known as: novoLOG Inject 0-9 Units into the skin 3 (three) times daily with meals.   insulin detemir 100 UNIT/ML FlexPen Commonly known as: LEVEMIR Inject 20 Units into the skin at bedtime.   Insulin Pen Needle 32G X 4 MM Misc 1 pen by Does not apply route 3 (three) times daily.   isosorbide mononitrate 60 MG 24 hr tablet Commonly known as: IMDUR Take 1 tablet (60 mg total) by mouth daily.   losartan 25 MG tablet Commonly known as: COZAAR TAKE 1/2 TABLET(12.5 MG) BY MOUTH DAILY   metoprolol tartrate 25 MG tablet Commonly known as: LOPRESSOR TAKE 1/2 TABLET(12.5 MG) BY MOUTH TWICE DAILY   nitroGLYCERIN 0.4 MG SL tablet Commonly known as: NITROSTAT DISSOLVE ONE TABLET UNDER TONGUE AS NEEDED FOR CHEST PAIN   Repatha 140 MG/ML Sos[place] Generic drug: Evolocumab Inject 140 mg into the skin every 14 (fourteen) days.   rosuvastatin 40 MG tablet Commonly known as: CRESTOR Take 1 tablet (40 mg total) by mouth daily at 6 PM.   senna 8.6 MG Tabs tablet Commonly known as: SENOKOT Take 1 tablet (8.6 mg total) by mouth daily.   SODIUM PHOSPHATES RE Place 1 each rectally daily as needed (Constipation not relieved by Bisacodyl).   Tdap 5-2.5-18.5 LF-MCG/0.5 injection Commonly known as: BOOSTRIX Inject 0.5 mLs into the muscle once.   Trulicity 2.09 OB/0.9GG Sopn Generic drug: Dulaglutide ADMINISTER 0.75 MG UNDER THE SKIN 1 TIME A WEEK       Review of Systems   Constitutional: Negative for appetite change, chills, fatigue and fever.  HENT: Negative for congestion, hearing loss, rhinorrhea, sinus pressure, sinus pain, sneezing, sore throat and trouble swallowing.   Eyes: Negative for discharge, redness and itching.  Respiratory: Negative for cough, chest tightness, shortness of breath and wheezing.   Cardiovascular: Negative for chest pain, palpitations and leg swelling.  Gastrointestinal: Negative for abdominal distention, abdominal pain, constipation, diarrhea, nausea and vomiting.  Endocrine: Negative for cold intolerance, heat intolerance, polydipsia, polyphagia and polyuria.  Genitourinary: Negative for difficulty urinating, dysuria, frequency and urgency.  Musculoskeletal: Positive for arthralgias and back pain. Negative for gait problem, joint swelling and myalgias.  Skin: Negative for color change, pallor and rash.  Neurological: Negative for dizziness, speech difficulty, weakness, light-headedness, numbness and headaches.  Hematological: Does not bruise/bleed easily.  Psychiatric/Behavioral: Negative for agitation, behavioral problems and sleep disturbance. The patient is not nervous/anxious.     Immunization History  Administered Date(s) Administered  . PFIZER SARS-COV-2 Vaccination 08/13/2019, 09/06/2019  . Pneumococcal Conjugate-13 01/14/2020   Pertinent  Health Maintenance Due  Topic Date Due  . FOOT EXAM  Never done  . OPHTHALMOLOGY EXAM  Never done  . INFLUENZA VACCINE  Never done  . HEMOGLOBIN A1C  08/30/2020  . PNA vac Low Risk Adult (2 of 2 - PPSV23) 01/13/2021  . COLONOSCOPY  01/03/2028   Fall Risk  03/07/2020 01/14/2020 10/08/2019  Falls in the past year? 0 0 0  Number falls in past yr: 0 0 0  Injury with Fall? 0 0 0    Vitals:   03/07/20 0809  BP: (!) 162/100  Pulse: 78  Resp: 18  Temp: 97.7 F (36.5 C)  SpO2: 97%  Weight: 250 lb 3.2 oz (113.5 kg)  Height: _0  (1.676 m)   Body mass index is 40.38  kg/m. Physical Exam Vitals reviewed.  Constitutional:      General: He is not in acute distress.    Appearance: He is obese. He is not ill-appearing.  HENT:     Head: Normocephalic.     Right Ear: Tympanic membrane, ear canal and external ear normal. There is no impacted cerumen.     Left Ear: Tympanic membrane, ear canal and external ear normal.     Ears:     Comments: Left ear partial cerumen noted TM clear. Declined cerumen removal this visit state next time.     Nose: Nose normal. No congestion or rhinorrhea.     Mouth/Throat:     Mouth: Mucous membranes are moist.     Pharynx: Oropharynx is clear. No oropharyngeal exudate or posterior oropharyngeal erythema.  Eyes:     General: No scleral icterus.       Right eye: No discharge.        Left eye: No discharge.     Extraocular Movements: Extraocular movements intact.     Conjunctiva/sclera: Conjunctivae normal.     Pupils: Pupils are equal, round, and reactive to light.  Neck:     Vascular: No carotid bruit.  Cardiovascular:     Rate and Rhythm: Normal rate and regular rhythm.     Pulses: Normal pulses.     Heart sounds: Normal heart sounds. No murmur heard.  No friction rub. No gallop.   Pulmonary:     Effort: Pulmonary effort is normal. No respiratory distress.     Breath sounds: Normal breath sounds. No wheezing, rhonchi or rales.  Chest:     Chest wall: No tenderness.  Abdominal:     General: Bowel sounds are normal. There is no distension.     Palpations: Abdomen is soft. There is no mass.     Tenderness: There is no abdominal tenderness. There is no right CVA tenderness, left CVA tenderness, guarding or rebound.  Musculoskeletal:        General: No swelling or tenderness. Normal range of motion.     Cervical back: Normal range of motion. No rigidity or tenderness.     Right lower leg: No edema.     Left lower leg: No edema.  Lymphadenopathy:     Cervical: No cervical adenopathy.  Skin:    General: Skin is warm  and dry.     Coloration: Skin is not pale.     Findings: No bruising, erythema or rash.  Neurological:     Mental Status: He is alert and oriented to person, place, and time.     Cranial Nerves: No cranial nerve deficit.     Motor: No weakness.     Coordination: Coordination normal.  Gait: Gait abnormal.  Psychiatric:        Mood and Affect: Mood normal.        Behavior: Behavior normal.        Thought Content: Thought content normal.        Judgment: Judgment normal.    Labs reviewed: Recent Labs    09/21/19 0501 09/21/19 0501 09/29/19 0000 01/10/20 0846 03/02/20 0914  NA 135  --  135* 134* 139  K 4.5   < > 4.5 4.7 4.6  CL 106   < > 101 99 108  CO2 21*   < > _0 GLUCOSE 220*  --   --  380* 103*  BUN 23  --  28* 30* 30*  CREATININE 1.75*  --  1.7* 1.77* 1.43*  CALCIUM 9.3   < > 9.5 9.4 9.2   < > = values in this interval not displayed.   Recent Labs    09/16/19 0835 01/10/20 0846 03/02/20 0914  AST 17 20 42*  ALT 22 24 59*  ALKPHOS 87  --   --   BILITOT 1.0 0.6 0.4  PROT 7.6 7.1 7.0  ALBUMIN 3.8  --   --    Recent Labs    09/21/19 0501 01/10/20 0846 03/02/20 0914  WBC 8.7 7.9 8.8  NEUTROABS 5.6 5,356 4,778  HGB 13.3 14.7 15.2  HCT 39.4 44.3 45.8  MCV 88.1 89.3 89.6  PLT 135* 153 163   Lab Results  Component Value Date   TSH 4.56 (H) 03/02/2020   Lab Results  Component Value Date   HGBA1C 8.8 (H) 03/02/2020   Lab Results  Component Value Date   CHOL 128 03/02/2020   HDL 49 03/02/2020   LDLCALC 63 03/02/2020   TRIG 84 03/02/2020   CHOLHDL 2.6 03/02/2020    Significant Diagnostic Results in last 30 days:  No results found.  Assessment/Plan 1. Type 2 diabetes, controlled, with peripheral neuropathy (HCC) Lab Results  Component Value Date   HGBA1C 8.8 (H) 03/02/2020  Reported CBG has improved. - Encouraged to continue with dietary modification,Exercise and Alcohol cessation. - continue on Levemir,Trulicity,Novolog   - TSH;  Future - Hemoglobin A1c; Future  2. Benign hypertension with chronic kidney disease, stage III (HCC) B/p not at goal this visit though has not taken his morning medication. - Encourage to monitor his B/P at home and notify provider if B/p > 140/90  - continue on Metoprolol,losartan,Imdur and amlodipine. - CBC with Differential/Platelet; Future - CMP with eGFR([facility]); Future - Lipid panel; Future  3. Gastroesophageal reflux disease without esophagitis Symptoms controlled. H/H stable.   4. Primary osteoarthritis involving multiple joints Continue on Norco as below.  - HYDROcodone-acetaminophen (NORCO) 10-325 MG tablet; Take 1 tablet by mouth 3 (three) times daily as needed for up to 30 doses for moderate pain.  Dispense: 90 tablet; Refill: 0  5. Chronic pain syndrome Chronic low back pain.He might require additional pain medication with his upcoming dental work.will continue current dose for now.Dentist may make temporally adjustment for his pain medication.Patient reassured that it's okay for the dentist to make changes or add to his pain ,medication.  - HYDROcodone-acetaminophen (NORCO) 10-325 MG tablet; Take 1 tablet by mouth 3 (three) times daily as needed for up to 30 doses for moderate pain.  Dispense: 90 tablet; Refill: 0  6. Elevated liver enzymes - AST 42,ALT 59 - Alcohol use cessation strongly advised has cut down but encouraged to quit drinking especially with  upcoming Holiday season.  - Hepatic Function Panel; Future in 2 months.   7. Olecranon bursitis of left elbow Pain under controlled.erythema resolved.  - HYDROcodone-acetaminophen (NORCO) 10-325 MG tablet; Take 1 tablet by mouth 3 (three) times daily as needed for up to 30 doses for moderate pain.  Dispense: 90 tablet; Refill: 0  8. Pure Hypercholesterolemia  LDL at goal. Continue on dietary modification and exercise. - continue on Repatha ,Rosuvastatin and ezetimibe    Family/ staff Communication: Reviewed plan of  care with patient verbalized understanding.   Labs/tests ordered:  - Hepatic Function Panel; Future in 2 months.  - TSH; Future - Hemoglobin A1c; Future - CBC with Differential/Platelet; Future - CMP with eGFR([facility]); Future - Lipid panel; Future  Next Appointment :   - 6 months for medical management of chronic issues fasting labs 2-4 days prior to visit. - Hepatic Function Panel; Future in 2 months.    Sandrea Hughs, NP

## 2020-03-10 ENCOUNTER — Encounter: Payer: Self-pay | Admitting: Cardiology

## 2020-03-10 ENCOUNTER — Ambulatory Visit: Payer: Medicare HMO | Admitting: Cardiology

## 2020-03-10 ENCOUNTER — Other Ambulatory Visit: Payer: Self-pay

## 2020-03-10 ENCOUNTER — Ambulatory Visit: Payer: Medicare HMO

## 2020-03-10 VITALS — BP 142/79 | HR 71 | Resp 16 | Ht 66.0 in | Wt 255.8 lb

## 2020-03-10 DIAGNOSIS — E66813 Obesity, class 3: Secondary | ICD-10-CM

## 2020-03-10 DIAGNOSIS — Z955 Presence of coronary angioplasty implant and graft: Secondary | ICD-10-CM

## 2020-03-10 DIAGNOSIS — Z794 Long term (current) use of insulin: Secondary | ICD-10-CM

## 2020-03-10 DIAGNOSIS — Z6841 Body Mass Index (BMI) 40.0 and over, adult: Secondary | ICD-10-CM

## 2020-03-10 DIAGNOSIS — Z8673 Personal history of transient ischemic attack (TIA), and cerebral infarction without residual deficits: Secondary | ICD-10-CM

## 2020-03-10 DIAGNOSIS — I119 Hypertensive heart disease without heart failure: Secondary | ICD-10-CM

## 2020-03-10 DIAGNOSIS — Z87891 Personal history of nicotine dependence: Secondary | ICD-10-CM

## 2020-03-10 DIAGNOSIS — N1831 Chronic kidney disease, stage 3a: Secondary | ICD-10-CM

## 2020-03-10 DIAGNOSIS — I2510 Atherosclerotic heart disease of native coronary artery without angina pectoris: Secondary | ICD-10-CM

## 2020-03-10 DIAGNOSIS — E1159 Type 2 diabetes mellitus with other circulatory complications: Secondary | ICD-10-CM

## 2020-03-10 DIAGNOSIS — Z951 Presence of aortocoronary bypass graft: Secondary | ICD-10-CM

## 2020-03-10 DIAGNOSIS — E7800 Pure hypercholesterolemia, unspecified: Secondary | ICD-10-CM

## 2020-03-10 MED ORDER — EVOLOCUMAB 140 MG/ML ~~LOC~~ SOAJ
140.0000 mg | Freq: Once | SUBCUTANEOUS | Status: AC
  Administered 2020-03-10: 140 mg via SUBCUTANEOUS

## 2020-03-10 NOTE — Progress Notes (Signed)
David Burton Date of Birth: [birth year] MRN: [id number] Primary Care Provider:[name], [name], NP Former Cardiology Providers: Dr. Adrian Prows, Jeri Lager, APRN, FNP-C Primary Cardiologist: [name], DO, [facility] (established care 01/11/2020)  Date: 03/10/20 Last Office Visit: 01/11/2020  Chief Complaint  Patient presents with  . Coronary Artery Disease  . Hyperlipidemia  . Follow-up    7 week    HPI  David Burton is a 74 y.o.  male who presents to the office with a chief complaint of " heart disease and lipid management." Patient's past medical history and cardiovascular risk factors include: NSTEMI in Dec 2018, s/p CABG x 4 on 05/08/2017 (LIMA to LAD, SVG to PDA, SVG to Diagonal, SVG to Ramus. Surgery done at [facility] [facility]), diabetes mellitus type 2 with peripheral neuropathy, hypertension, hypercholesterolemia, CVA (09/2019), advanced age.  Previously under the care of Miquel Dunn and is being followed in the clinic for his underlying CAD status post surgical revascularization and lipid management.  Since last office visit patient is doing well from a cardiovascular standpoint.  At last office visit I encouraged him to improve his modifiable cardiovascular risk factors that he had uncontrolled diabetes mellitus with hemoglobin A1c of 12.9.  His lipid profile was not optimized as his fasting LDL 124 mg/dL and triglycerides 274.  Since last visit patient has been more cognizant of his glycemic control and his most recent hemoglobin A1c has come down to 8.8.  He was also started on Repatha through our office and his total cholesterol has improved to 128, LDL is now 79, and triglycerides 84.  Given the reasons for review supposed to have been mobile cardiac ambulatory telemetry which he forgot to complete.  No records available for [place] to review at today's encounter.  Since last visit no use of sublingual nitroglycerin tablets.  Patient's blood pressures are not well controlled.   Patient states that he does not have a means to check his blood pressures at home.  Recommended enrolling into remote patient monitoring for ambulatory blood pressure.  Patient is agreeable.  FUNCTIONAL STATUS: He walks atleast 0.5 miles per day.    ALLERGIES: No Known Allergies  MEDICATION LIST PRIOR TO VISIT: Current Outpatient Medications on File Prior to Visit  Medication Sig Dispense Refill  . amLODipine (NORVASC) 5 MG tablet Take 1 tablet by mouth daily.    . bisacodyl (DULCOLAX) 10 MG suppository Place 10 mg rectally daily as needed for moderate constipation.    . clopidogrel (PLAVIX) 75 MG tablet Take 1 tablet (75 mg total) by mouth daily. 90 tablet 3  . ezetimibe (ZETIA) 10 MG tablet Take 1 tablet (10 mg total) by mouth daily. 90 tablet 1  . glucose blood (ACCU-CHEK AVIVA PLUS) test strip Use to test blood sugar 3 times daily. Dx: E11.42 300 each 1  . HYDROcodone-acetaminophen (NORCO) 10-325 MG tablet Take 1 tablet by mouth 3 (three) times daily as needed for up to 30 doses for moderate pain. 90 tablet 0  . Ibuprofen (ADVIL) 200 MG CAPS Take 1 capsule by mouth as needed.    . insulin aspart (NOVOLOG) 100 UNIT/ML injection Inject 0-9 Units into the skin 3 (three) times daily with meals. 10 mL 0  . insulin detemir (LEVEMIR) 100 UNIT/ML FlexPen Inject 20 Units into the skin at bedtime. 15 mL 5  . Insulin Pen Needle 32G X 4 MM MISC 1 pen by Does not apply route 3 (three) times daily. 90 each 3  . isosorbide mononitrate (IMDUR)  60 MG 24 hr tablet Take 1 tablet (60 mg total) by mouth daily. 90 tablet 1  . losartan (COZAAR) 25 MG tablet TAKE 1/2 TABLET(12.5 MG) BY MOUTH DAILY 45 tablet 1  . metoprolol tartrate (LOPRESSOR) 25 MG tablet TAKE 1/2 TABLET(12.5 MG) BY MOUTH TWICE DAILY 90 tablet 1  . nitroGLYCERIN (NITROSTAT) 0.4 MG SL tablet DISSOLVE ONE TABLET UNDER TONGUE AS NEEDED FOR CHEST PAIN 25 tablet 1  . REPATHA 140 MG/ML SOSY Inject 140 mg into the skin every 14 (fourteen) days. 2.1 mL  3  . rosuvastatin (CRESTOR) 40 MG tablet Take 1 tablet (40 mg total) by mouth daily at 6 PM. 90 tablet 1  . senna (SENOKOT) 8.6 MG TABS tablet Take 1 tablet (8.6 mg total) by mouth daily. 120 tablet 5  . Tdap (BOOSTRIX) 5-2.5-18.5 LF-MCG/0.5 injection Inject 0.5 mLs into the muscle once.    . TRULICITY 9.60 AV/4.0JW SOPN ADMINISTER 0.75 MG UNDER THE SKIN 1 TIME A WEEK 2 mL 5   No current facility-administered medications on file prior to visit.    PAST MEDICAL HISTORY: Past Medical History:  Diagnosis Date  . Bell palsy   . CKD (chronic kidney disease), stage III ([place]) 12/16/2018  . Coronary artery disease involving native coronary artery of native heart with unstable angina pectoris ([place]) 12/16/2018  . GERD (gastroesophageal reflux disease)   . Hypertension   . Hypertension with heart disease 12/16/2018  . Myocardial infarction ([place])   . Pure hypercholesterolemia 12/16/2018  . Stroke ([place])   . Type 2 diabetes, controlled, with peripheral neuropathy ([place]) 12/16/2018    PAST SURGICAL HISTORY: Past Surgical History:  Procedure Laterality Date  . CARDIAC SURGERY    . COLONOSCOPY WITH PROPOFOL N/A 01/02/2018   Procedure: COLONOSCOPY WITH PROPOFOL;  Surgeon: Carol Ada, MD;  Location: [facility];  Service: Endoscopy;  Laterality: N/A;  . CORONARY STENT INTERVENTION N/A 01/26/2019   Procedure: CORONARY STENT INTERVENTION;  Surgeon: Adrian Prows, MD;  Location: [facility];  Service: Cardiovascular;  Laterality: N/A;  . LEFT HEART CATH AND CORS/GRAFTS ANGIOGRAPHY N/A 01/05/2019   Procedure: LEFT HEART CATH AND CORS/GRAFTS ANGIOGRAPHY;  Surgeon: Adrian Prows, MD;  Location: [facility];  Service: Cardiovascular;  Laterality: N/A;  . POLYPECTOMY  01/02/2018   Procedure: POLYPECTOMY;  Surgeon: Carol Ada, MD;  Location: [facility];  Service: Endoscopy;;  . stents      FAMILY HISTORY: The patient's family history includes Cancer in his mother; Heart attack in his father.    SOCIAL HISTORY:  The patient  reports that he quit smoking about 16 years ago. His smoking use included cigarettes. He has a 5.00 pack-year smoking history. He has never used smokeless tobacco. He reports previous alcohol use. He reports that he does not use drugs.  Review of Systems  Constitutional: Negative for chills and fever.  HENT: Negative for hoarse voice and nosebleeds.   Eyes: Negative for discharge, double vision and pain.  Cardiovascular: Positive for dyspnea on exertion (stable). Negative for chest pain, claudication, leg swelling, near-syncope, orthopnea, palpitations, paroxysmal nocturnal dyspnea and syncope.  Respiratory: Negative for hemoptysis and shortness of breath.   Musculoskeletal: Negative for muscle cramps and myalgias.  Gastrointestinal: Negative for abdominal pain, constipation, diarrhea, hematemesis, hematochezia, melena, nausea and vomiting.  Neurological: Positive for focal weakness (left leg weakness (residual from recent CVA) improving. ). Negative for dizziness and light-headedness.   PHYSICAL EXAM: Vitals with BMI 03/10/2020 03/07/2020 01/14/2020  Height 5\' 6"  5\' 6"  5'  6"  Weight 255 lbs 13 oz 250 lbs 3 oz 244 lbs 6 oz  BMI 41.31 42.5 95.63  Systolic 875 643 329  Diastolic 79 90 84  Pulse 71 78 69   CONSTITUTIONAL: Well-developed and well-nourished. No acute distress.  SKIN: Skin is warm and dry. No rash noted. No cyanosis. No pallor. No jaundice HEAD: Normocephalic and atraumatic.  EYES: No scleral icterus MOUTH/THROAT: Moist oral membranes.  NECK: No JVD present. No thyromegaly noted. No carotid bruits  LYMPHATIC: No visible cervical adenopathy.  CHEST Normal respiratory effort. No intercostal retractions  LUNGS: Clear to auscultation bilaterally.  No stridor. No wheezes. No rales.  CARDIOVASCULAR: Regular rate and rhythm, positive S1-S2, no murmurs rubs or gallops appreciated. ABDOMINAL: No apparent ascites.  EXTREMITIES: No peripheral edema   HEMATOLOGIC: No significant bruising NEUROLOGIC: Oriented to person, place, and time. Nonfocal. Normal muscle tone.  PSYCHIATRIC: Normal mood and affect. Normal behavior. Cooperative  RADIOLOGY: MR Brain w/o contrast:  IMPRESSION: 1. Mildly motion degraded exam. 2. Confirmed 17 mm acute/early subacute infarct within the right pons. 3. Background mild chronic small vessel ischemic disease within the cerebral white matter and pons. Tiny chronic lacunar infarct within the left cerebellum. 4. Mild generalized parenchymal atrophy. 5. Mild paranasal sinus mucosal thickening. Trace left mastoid effusion.  CARDIAC DATABASE: Coronary artery bypass grafting: s/p CABG x 4 on 05/08/2017 (LIMA to LAD, SVG to PDA, SVG to Diagonal, SVG to Ramus.  EKG: 01/11/2020: Normal sinus rhythm, 82 bpm, left axis deviation, left anterior fascicular block, incomplete right bundle branch block, old inferior infarct, without underlying ischemia or injury pattern.    Echocardiogram: 09/17/2019: LVEF 55 to 60%, no regional wall motion arise, grade 2 diastolic impairment, elevated LVEDP, right ventricular size mildly dilated, biatrial dilatation, trivial MR, aortic valve sclerosis without stenosis.  Stress Testing:  Lexiscan myoview stress test 10/10/2017: 1. Lexiscan stress test was performed. Exercise capacity was not assessed. Stress symptoms included dizziness. Normal blood pressure. The resting electrocardiogram demonstrated normal sinus rhythm, normal resting conduction, no resting arrhythmias and normal rest repolarization. Stress EKG is non diagnostic for ischemia as it is a pharmacologic stress. 2. The overall quality of the study is good. Left ventricular cavity is noted to be normal on the rest and stress studies. Gated SPECT images reveal normal myocardial thickening and wall motion, except mild inferior hypokinesis. The left ventricular ejection fraction was calculated or visually estimated to be 53%. SPECT  stress images demonstrate medium size, moderate intensity perfusion defect in basal to apical inferior, inferolateral myocardium. SPECT rest images demonstrate medium size, mild intensity perfusion defect in basal to apical inferior myocardium. This suggests inferior infarct with periinfarct mild ischemia, as well as inferolateral ischemia. (LCx/PDA territory). 3. Intermediate risk study.  Heart Catheterization: Coronary angiogram 01/05/2019: Normal LVEDP, severe native vessel disease.  High-grade proximal RCA 90 to 95% stenosis followed by 70 to 80% stenosis in the in-stent restenotic proximal segment.  Large RCA.  SVG to RCA could not be visualized in spite of multiple catheter attempts, and presumed occluded. Left main is large and mildly diseased but patent.  LAD is occluded in the midsegment after the origin of D1, which has ostial 90% stenosis.  SVG to D1 is patent.  LIMA to LAD is patent. Ramus intermediate: Large vessel, mild to moderate calcific 80 to 85% stenosis in the proximal segment.  SVG to ramus intermediate is occluded. Circumflex: High-grade 99%/subtotal occlusion in the ostium.  Moderate to large sized vessel.  Small marginals.  Coronary intervention 01/26/2019: Successful PTCA and stenting of the proximal mid and mid to distal RCA with implantation of 2 overlapping 3.0 x 38 mm Promus Synergy DES, stenosis reduced from 90% to 0% with TIMI-3 to TIMI-3 flow.    Recommendation: With medical high-grade ramus intermediate and circumflex stenosis, he has excellent brisk flow there and ischemia was in the inferior wall, however if he still persist to have recurrence of angina pectoris we will schedule him for complex intervention to the left coronary system.  In view of his underlying comorbidity, would recommend medical therapy  Carotid duplex: 09/17/2019: Right Carotid: Velocities in the right ICA are consistent with a 1-39% stenosis.  Left Carotid: Velocities in the left ICA are  consistent with a 1-39% stenosis.  Vertebrals: Left vertebral artery demonstrates antegrade flow. Right vertebral artery was not visualized  LABORATORY DATA: CBC Latest Ref Rng & Units 03/02/2020 01/10/2020 09/21/2019  WBC 3.8 - 10.8 Thousand/uL 8.8 7.9 8.7  Hemoglobin 13.2 - 17.1 g/dL 15.2 14.7 13.3  Hematocrit 38 - 50 % 45.8 44.3 39.4  Platelets 140 - 400 Thousand/uL 163 153 135(L)    CMP Latest Ref Rng & Units 03/02/2020 01/10/2020 09/29/2019  Glucose 65 - 99 mg/dL 103(H) 380(H) -  BUN 7 - 25 mg/dL 30(H) 30(H) 28(A)  Creatinine 0.70 - 1.18 mg/dL 1.43(H) 1.77(H) 1.7(A)  Sodium 135 - 146 mmol/L 139 134(L) 135(A)  Potassium 3.5 - 5.3 mmol/L 4.6 4.7 4.5  Chloride 98 - 110 mmol/L 108 99 101  CO2 20 - 32 mmol/L 23 26 19   Calcium 8.6 - 10.3 mg/dL 9.2 9.4 9.5  Total Protein 6.1 - 8.1 g/dL 7.0 7.1 -  Total Bilirubin 0.2 - 1.2 mg/dL 0.4 0.6 -  Alkaline Phos 38 - 126 U/L - - -  AST 10 - 35 U/L 42(H) 20 -  ALT 9 - 46 U/L 59(H) 24 -   Last lipids Lab Results  Component Value Date   CHOL 128 03/02/2020   HDL 49 03/02/2020   LDLCALC 63 03/02/2020   TRIG 84 03/02/2020   CHOLHDL 2.6 03/02/2020    Lab Results  Component Value Date   HGBA1C 8.8 (H) 03/02/2020   HGBA1C 12.9 (H) 01/10/2020   HGBA1C 10.8 (H) 09/17/2019   No components found for: NTPROBNP Lab Results  Component Value Date   TSH 4.56 (H) 03/02/2020   TSH 4.18 01/10/2020    Cardiac Panel (last 3 results) No results for input(s): CKTOTAL, CKMB, TROPONINIHS, RELINDX in the last 72 hours.  IMPRESSION:    ICD-10-CM   1. Coronary artery disease involving native coronary artery of native heart without angina pectoris  I25.10   2. S/P CABG x 4  Z95.1   3. S/P primary angioplasty with coronary stent  Z95.5   4. History of stroke  Z86.73   5. Hypertension with heart disease  I11.9   6. Stage 3a chronic kidney disease (HCC)  N18.31   7. Pure hypercholesterolemia  E78.00 Evolocumab SOAJ 140 mg  8. Former smoker  Z87.891   13.  Class 3 severe obesity due to excess calories with serious comorbidity and body mass index (BMI) of 40.0 to 44.9 in adult (HCC)  E66.01    Z68.41   10. Type 2 diabetes mellitus with other circulatory complication, with long-term current use of insulin (HCC)  E11.59    Z79.4   11. Long-term insulin use (HCC)  Z79.4      RECOMMENDATIONS: David Burton is a 74 y.o. male whose  past medical history and cardiovascular risk factors include: NSTEMI in Dec 2018, s/p CABG x 4 on 05/08/2017 (LIMA to LAD, SVG to PDA, SVG to Diagonal, SVG to Ramus. Surgery done at [facility]), diabetes mellitus type 2 with peripheral neuropathy, hypertension, hypercholesterolemia, CVA (09/2019), advanced age.  Established coronary artery disease with prior history of four-vessel CABG without angina pectoris: Stable  No use of sublingual nitroglycerin tablets since last visit.  Encouraged increasing physical activity as tolerated with a goal of 30 minutes a day 5 days a week   Patient is congratulated on the efforts that he is made with improvement in his A1c from 12.9-8.8, LDL was 124 and now 63, triglycerides were 274 and now 84.    Medications reconciled.  Since last visit he has been initiated on Repatha which she is tolerating well without any side effects or intolerances.    Educated on the importance of secondary prevention.    History of CABG: See above  Recent stroke with residual deficits:  Patient is encouraged to follow-up with neurology as recommended.  Educated on the importance of secondary prevention by improving his modifiable cardiovascular risk factors as discussed above.  Patient noted to have atrial dilatation on most recent echocardiogram and with a history of recent stroke would recommend 14-day MCT to evaluate for underlying atrial fibrillation.  Pure hypercholesterolemia:  Continue Crestor and Zetia.  Continue Repatha  Most recent lipid profile from March 02, 2020  independently reviewed with the patient at today's visit  Mild increase in AST and ALT, patient is asked to follow-up with PCP.  Benign essential hypertension with chronic kidney disease stage 3 . Office blood pressure is not at goal.  . Medication reconciled.  . Patient will be enrolled into remote patient monitoring for ambulatory blood pressure management. . We will follow up with medication titration as clinically warranted. . Low salt diet recommended. A diet that is rich in fruits, vegetables, legumes, and low-fat dairy products and low in snacks, sweets, and meats (such as the Dietary Approaches to Stop Hypertension [DASH] diet).   Former smoker: Educated on the importance of continued smoking cessation.  Obesity, due to excess calories: Body mass index is 41.29 kg/m. . I reviewed with the patient the importance of diet, regular physical activity/exercise, weight loss.   . Patient is educated on increasing physical activity gradually as tolerated.  With the goal of moderate intensity exercise for 30 minutes a day 5 days a week.  FINAL MEDICATION LIST END OF ENCOUNTER: Meds ordered this encounter  Medications  . Evolocumab SOAJ 140 mg     Current Outpatient Medications:  .  amLODipine (NORVASC) 5 MG tablet, Take 1 tablet by mouth daily., Disp: , Rfl:  .  bisacodyl (DULCOLAX) 10 MG suppository, Place 10 mg rectally daily as needed for moderate constipation., Disp: , Rfl:  .  clopidogrel (PLAVIX) 75 MG tablet, Take 1 tablet (75 mg total) by mouth daily., Disp: 90 tablet, Rfl: 3 .  ezetimibe (ZETIA) 10 MG tablet, Take 1 tablet (10 mg total) by mouth daily., Disp: 90 tablet, Rfl: 1 .  glucose blood (ACCU-CHEK AVIVA PLUS) test strip, Use to test blood sugar 3 times daily. Dx: E11.42, Disp: 300 each, Rfl: 1 .  HYDROcodone-acetaminophen (NORCO) 10-325 MG tablet, Take 1 tablet by mouth 3 (three) times daily as needed for up to 30 doses for moderate pain., Disp: 90 tablet, Rfl: 0 .   Ibuprofen (ADVIL) 200 MG CAPS, Take 1 capsule by  mouth as needed., Disp: , Rfl:  .  insulin aspart (NOVOLOG) 100 UNIT/ML injection, Inject 0-9 Units into the skin 3 (three) times daily with meals., Disp: 10 mL, Rfl: 0 .  insulin detemir (LEVEMIR) 100 UNIT/ML FlexPen, Inject 20 Units into the skin at bedtime., Disp: 15 mL, Rfl: 5 .  Insulin Pen Needle 32G X 4 MM MISC, 1 pen by Does not apply route 3 (three) times daily., Disp: 90 each, Rfl: 3 .  isosorbide mononitrate (IMDUR) 60 MG 24 hr tablet, Take 1 tablet (60 mg total) by mouth daily., Disp: 90 tablet, Rfl: 1 .  losartan (COZAAR) 25 MG tablet, TAKE 1/2 TABLET(12.5 MG) BY MOUTH DAILY, Disp: 45 tablet, Rfl: 1 .  metoprolol tartrate (LOPRESSOR) 25 MG tablet, TAKE 1/2 TABLET(12.5 MG) BY MOUTH TWICE DAILY, Disp: 90 tablet, Rfl: 1 .  nitroGLYCERIN (NITROSTAT) 0.4 MG SL tablet, DISSOLVE ONE TABLET UNDER TONGUE AS NEEDED FOR CHEST PAIN, Disp: 25 tablet, Rfl: 1 .  REPATHA 140 MG/ML SOSY, Inject 140 mg into the skin every 14 (fourteen) days., Disp: 2.1 mL, Rfl: 3 .  rosuvastatin (CRESTOR) 40 MG tablet, Take 1 tablet (40 mg total) by mouth daily at 6 PM., Disp: 90 tablet, Rfl: 1 .  senna (SENOKOT) 8.6 MG TABS tablet, Take 1 tablet (8.6 mg total) by mouth daily., Disp: 120 tablet, Rfl: 5 .  Tdap (BOOSTRIX) 5-2.5-18.5 LF-MCG/0.5 injection, Inject 0.5 mLs into the muscle once., Disp: , Rfl:  .  TRULICITY 1.21 FX/5.8IT SOPN, ADMINISTER 0.75 MG UNDER THE SKIN 1 TIME A WEEK, Disp: 2 mL, Rfl: 5  No orders of the defined types were placed in this encounter.  --Continue cardiac medications as reconciled in final medication list. --Return in about 6 months (around 09/08/2020) for Follow up, CAD. Or sooner if needed. --Continue follow-up with your primary care physician regarding the management of your other chronic comorbid conditions.  Patient's questions and concerns were addressed to his satisfaction. He voices understanding of the instructions provided during  this encounter.   This note was created using a voice recognition software as a result there may be grammatical errors inadvertently enclosed that do not reflect the nature of this encounter. Every attempt is made to correct such errors.  Total time spent: 30 minutes.  [name], [place], [facility]  Pager: [phone] Office: [phone]

## 2020-03-14 ENCOUNTER — Other Ambulatory Visit: Payer: Self-pay

## 2020-03-14 ENCOUNTER — Other Ambulatory Visit: Payer: Self-pay | Admitting: *Deleted

## 2020-03-14 DIAGNOSIS — Z8673 Personal history of transient ischemic attack (TIA), and cerebral infarction without residual deficits: Secondary | ICD-10-CM

## 2020-03-14 DIAGNOSIS — Z955 Presence of coronary angioplasty implant and graft: Secondary | ICD-10-CM

## 2020-03-14 DIAGNOSIS — I2510 Atherosclerotic heart disease of native coronary artery without angina pectoris: Secondary | ICD-10-CM

## 2020-03-14 DIAGNOSIS — I129 Hypertensive chronic kidney disease with stage 1 through stage 4 chronic kidney disease, or unspecified chronic kidney disease: Secondary | ICD-10-CM

## 2020-03-14 DIAGNOSIS — N183 Chronic kidney disease, stage 3 unspecified: Secondary | ICD-10-CM

## 2020-03-14 DIAGNOSIS — Z951 Presence of aortocoronary bypass graft: Secondary | ICD-10-CM

## 2020-03-14 DIAGNOSIS — I25110 Atherosclerotic heart disease of native coronary artery with unstable angina pectoris: Secondary | ICD-10-CM

## 2020-03-14 DIAGNOSIS — E1142 Type 2 diabetes mellitus with diabetic polyneuropathy: Secondary | ICD-10-CM

## 2020-03-14 DIAGNOSIS — E7800 Pure hypercholesterolemia, unspecified: Secondary | ICD-10-CM

## 2020-03-14 MED ORDER — ISOSORBIDE MONONITRATE ER 60 MG PO TB24: 60.0000 mg | ORAL_TABLET | Freq: Every day | ORAL | 1 refills | Status: DC

## 2020-03-14 MED ORDER — EZETIMIBE 10 MG PO TABS: 10.0000 mg | ORAL_TABLET | Freq: Every day | ORAL | 1 refills | Status: DC

## 2020-03-14 MED ORDER — TRULICITY 0.75 MG/0.5ML ~~LOC~~ SOAJ: SUBCUTANEOUS | 1 refills | Status: DC

## 2020-03-14 MED ORDER — REPATHA 140 MG/ML ~~LOC~~ SOSY: 140.0000 mg | PREFILLED_SYRINGE | SUBCUTANEOUS | 3 refills | Status: DC

## 2020-03-14 MED ORDER — CLOPIDOGREL BISULFATE 75 MG PO TABS: 75.0000 mg | ORAL_TABLET | Freq: Every day | ORAL | 1 refills | Status: DC

## 2020-03-14 MED ORDER — METOPROLOL TARTRATE 25 MG PO TABS: ORAL_TABLET | ORAL | 1 refills | Status: DC

## 2020-03-14 MED ORDER — ROSUVASTATIN CALCIUM 40 MG PO TABS: 40.0000 mg | ORAL_TABLET | Freq: Every day | ORAL | 1 refills | Status: DC

## 2020-03-14 MED ORDER — AMLODIPINE BESYLATE 5 MG PO TABS
5.0000 mg | ORAL_TABLET | Freq: Every day | ORAL | 1 refills | Status: DC
Start: 2020-03-14 — End: 2020-06-30

## 2020-03-14 MED ORDER — INSULIN ASPART 100 UNIT/ML ~~LOC~~ SOLN: 0.0000 [IU] | Freq: Three times a day (TID) | SUBCUTANEOUS | 1 refills | Status: DC

## 2020-03-14 MED ORDER — LOSARTAN POTASSIUM 25 MG PO TABS: ORAL_TABLET | ORAL | 1 refills | Status: DC

## 2020-03-14 MED ORDER — NITROGLYCERIN 0.4 MG SL SUBL: SUBLINGUAL_TABLET | SUBLINGUAL | 3 refills | Status: DC

## 2020-03-14 MED ORDER — INSULIN DETEMIR 100 UNIT/ML FLEXPEN: 20.0000 [IU] | PEN_INJECTOR | Freq: Every day | SUBCUTANEOUS | 1 refills | Status: DC

## 2020-03-14 NOTE — Telephone Encounter (Signed)
Received fax from [facility] requesting refills.  Called patient can confirmed he requested refills from [facility]. Agreed.   Faxed.

## 2020-03-15 ENCOUNTER — Other Ambulatory Visit: Payer: Self-pay | Admitting: *Deleted

## 2020-03-15 MED ORDER — NOVOLOG FLEXPEN 100 UNIT/ML ~~LOC~~ SOPN: PEN_INJECTOR | SUBCUTANEOUS | 1 refills | Status: DC

## 2020-03-15 NOTE — Telephone Encounter (Signed)
Received fax from [facility] needing Rx for the Novolog Flexpens.

## 2020-03-23 ENCOUNTER — Other Ambulatory Visit: Payer: Self-pay

## 2020-03-23 DIAGNOSIS — I2510 Atherosclerotic heart disease of native coronary artery without angina pectoris: Secondary | ICD-10-CM

## 2020-03-23 DIAGNOSIS — E7800 Pure hypercholesterolemia, unspecified: Secondary | ICD-10-CM

## 2020-03-23 DIAGNOSIS — Z951 Presence of aortocoronary bypass graft: Secondary | ICD-10-CM

## 2020-03-23 DIAGNOSIS — Z955 Presence of coronary angioplasty implant and graft: Secondary | ICD-10-CM

## 2020-03-23 DIAGNOSIS — Z8673 Personal history of transient ischemic attack (TIA), and cerebral infarction without residual deficits: Secondary | ICD-10-CM

## 2020-03-23 MED ORDER — EZETIMIBE 10 MG PO TABS: 10.0000 mg | ORAL_TABLET | Freq: Every day | ORAL | 1 refills | Status: DC

## 2020-03-23 MED ORDER — CLOPIDOGREL BISULFATE 75 MG PO TABS: 75.0000 mg | ORAL_TABLET | Freq: Every day | ORAL | 1 refills | Status: DC

## 2020-03-23 MED ORDER — REPATHA 140 MG/ML ~~LOC~~ SOSY: 140.0000 mg | PREFILLED_SYRINGE | SUBCUTANEOUS | 3 refills | Status: DC

## 2020-03-24 ENCOUNTER — Telehealth: Payer: Self-pay

## 2020-03-24 NOTE — Telephone Encounter (Signed)
If u see this in the faxes pls let me know! Thanks!   -Leda Quail

## 2020-03-24 NOTE — Telephone Encounter (Signed)
Okay, please help organize it and it me know when the p2p needs to be done will arrange for it.

## 2020-03-30 NOTE — Progress Notes (Signed)
appt

## 2020-04-03 NOTE — Progress Notes (Signed)
Please advise 

## 2020-04-04 ENCOUNTER — Other Ambulatory Visit: Payer: Self-pay | Admitting: Cardiology

## 2020-04-04 ENCOUNTER — Other Ambulatory Visit: Payer: Self-pay | Admitting: Nurse Practitioner

## 2020-04-04 DIAGNOSIS — E7800 Pure hypercholesterolemia, unspecified: Secondary | ICD-10-CM

## 2020-04-06 ENCOUNTER — Ambulatory Visit: Payer: Medicare HMO | Admitting: Endocrinology

## 2020-04-06 ENCOUNTER — Other Ambulatory Visit: Payer: Self-pay | Admitting: *Deleted

## 2020-04-06 DIAGNOSIS — M159 Polyosteoarthritis, unspecified: Secondary | ICD-10-CM

## 2020-04-06 DIAGNOSIS — G894 Chronic pain syndrome: Secondary | ICD-10-CM

## 2020-04-06 DIAGNOSIS — M7022 Olecranon bursitis, left elbow: Secondary | ICD-10-CM

## 2020-04-06 DIAGNOSIS — M8949 Other hypertrophic osteoarthropathy, multiple sites: Secondary | ICD-10-CM

## 2020-04-06 MED ORDER — HYDROCODONE-ACETAMINOPHEN 10-325 MG PO TABS: 1.0000 | ORAL_TABLET | Freq: Three times a day (TID) | ORAL | 0 refills | Status: DC | PRN

## 2020-04-06 NOTE — Telephone Encounter (Signed)
Patient requested refill Epic LR: 03/07/2020 Contract on [employer] [facility] and sent to [employer] for approval.

## 2020-04-07 ENCOUNTER — Ambulatory Visit
Admission: RE | Admit: 2020-04-07 | Discharge: 2020-04-07 | Disposition: A | Discharge status: Home / Self Care | Payer: Medicare HMO | Source: Ambulatory Visit | Attending: Family | Admitting: Family

## 2020-04-07 ENCOUNTER — Encounter: Payer: Self-pay | Admitting: Family

## 2020-04-07 ENCOUNTER — Other Ambulatory Visit: Payer: Self-pay

## 2020-04-07 ENCOUNTER — Ambulatory Visit (INDEPENDENT_AMBULATORY_CARE_PROVIDER_SITE_OTHER): Payer: Medicare HMO | Admitting: Family

## 2020-04-07 ENCOUNTER — Telehealth: Payer: Self-pay

## 2020-04-07 VITALS — BP 140/89 | HR 89 | Temp 97.5°F | Resp 18 | Ht 66.0 in | Wt 257.2 lb

## 2020-04-07 DIAGNOSIS — W57XXXA Bitten or stung by nonvenomous insect and other nonvenomous arthropods, initial encounter: Secondary | ICD-10-CM

## 2020-04-07 DIAGNOSIS — M549 Dorsalgia, unspecified: Secondary | ICD-10-CM

## 2020-04-07 DIAGNOSIS — R040 Epistaxis: Secondary | ICD-10-CM

## 2020-04-07 MED ORDER — CALAMINE EX LOTN: 1.0000 "application " | TOPICAL_LOTION | CUTANEOUS | 0 refills | Status: DC | PRN

## 2020-04-07 NOTE — Telephone Encounter (Signed)
I called patient to see if he would like an earlier appointment for today with Windell Moulding NP  but I got no answer so I left a voice message to ask him and left our phone number for him to cal if he did.

## 2020-04-07 NOTE — Progress Notes (Signed)
Provider: Marlowe Sax FNP-C  Maxine Fredman, [name], NP  Patient Care Team: Willson Lipa, [name], NP as PCP - General (Family Medicine)  Extended Emergency Contact Information Primary Emergency Contact: Mckinnon, Glick Mobile Phone: [phone] Relation: Daughter  Code Status:  DNR Goals of care: Advanced Directive information Advanced Directives 04/07/2020  Does Patient Have a Medical Advance Directive? No  Type of Advance Directive -  Does patient want to make changes to medical advance directive? No - Patient declined  Copy of [place] in Chart? -  Would patient like information on creating a medical advance directive? -     Chief Complaint  Patient presents with  . Acute Visit    Bloody Nose.    HPI:  Pt is a 74 y.o. male seen today for an acute visit for evaluation of nose bleed x 1 day.He states no bleeding since then.He states started a new medication for blood pressure that was ordered by his heart Doctor that was covering on behave of his main Doctor.He does not recall of the medication.He will have her daughter call with the name of the medication.Daughter states has not seen any new medication since all meds now goes to her house. He had headache with his nose bleed which has resolved.   Right upper back pain- He states Norco does relief the pain has had to take ibuprofen for pain daily. He thinks pain is more in the lungs than muscle.He states was unable to sleep due to bed bugs in his apartment.has had several bites on his arms,back and legs.Daughter has called a specialist to terminate bed bugs.this is also making angry and causing his blood pressure to be high.He request medication to help with itching.     Past Medical History:  Diagnosis Date  . Bell palsy   . CKD (chronic kidney disease), stage III ([place]) 12/16/2018  . Coronary artery disease involving native coronary artery of native heart with unstable angina pectoris ([place]) 12/16/2018  .  GERD (gastroesophageal reflux disease)   . Hypertension   . Hypertension with heart disease 12/16/2018  . Myocardial infarction ([place])   . Pure hypercholesterolemia 12/16/2018  . Stroke ([place])   . Type 2 diabetes, controlled, with peripheral neuropathy ([place]) 12/16/2018   Past Surgical History:  Procedure Laterality Date  . CARDIAC SURGERY    . COLONOSCOPY WITH PROPOFOL N/A 01/02/2018   Procedure: COLONOSCOPY WITH PROPOFOL;  Surgeon: Carol Ada, MD;  [facility];  Service: Endoscopy;  Laterality: N/A;  . CORONARY STENT INTERVENTION N/A 01/26/2019   Procedure: CORONARY STENT INTERVENTION;  Surgeon: Adrian Prows, MD;  Location: [facility];  Service: Cardiovascular;  Laterality: N/A;  . LEFT HEART CATH AND CORS/GRAFTS ANGIOGRAPHY N/A 01/05/2019   Procedure: LEFT HEART CATH AND CORS/GRAFTS ANGIOGRAPHY;  Surgeon: Adrian Prows, MD;  Location: [facility];  Service: Cardiovascular;  Laterality: N/A;  . POLYPECTOMY  01/02/2018   Procedure: POLYPECTOMY;  Surgeon: Carol Ada, MD;  [facility];  Service: Endoscopy;;  . stents      No Known Allergies  Outpatient Encounter Medications as of 04/07/2020  Medication Sig  . amLODipine (NORVASC) 5 MG tablet Take 1 tablet (5 mg total) by mouth daily.  . bisacodyl (DULCOLAX) 10 MG suppository Place 10 mg rectally daily as needed for moderate constipation.  . clopidogrel (PLAVIX) 75 MG tablet Take 1 tablet (75 mg total) by mouth daily.  . Dulaglutide (TRULICITY) 3.35 KT/6.2BW SOPN Administer 0.75mg  under the skin One time a  Week.  . ezetimibe (ZETIA) 10 MG tablet TAKE 1 TABLET(10 MG) BY MOUTH DAILY  . glucose blood (ACCU-CHEK AVIVA PLUS) test strip Use to test blood sugar 3 times daily. Dx: E11.42  . HYDROcodone-acetaminophen (NORCO) 10-325 MG tablet Take 1 tablet by mouth 3 (three) times daily as needed for up to 30 doses for moderate pain.  . Ibuprofen (ADVIL) 200 MG CAPS Take 1 capsule by mouth as needed.  . insulin aspart  (NOVOLOG FLEXPEN) 100 UNIT/ML FlexPen INJECT 8-10 UNITS INTO THE SKIN PER SLIDING SCALE THREE TIMES DAILY  . insulin detemir (LEVEMIR) 100 UNIT/ML FlexPen Inject 20 Units into the skin at bedtime.  . Insulin Pen Needle 32G X 4 MM MISC 1 pen by Does not apply route 3 (three) times daily.  . isosorbide mononitrate (IMDUR) 60 MG 24 hr tablet Take 1 tablet (60 mg total) by mouth daily.  Marland Kitchen losartan (COZAAR) 25 MG tablet TAKE 1/2 TABLET(12.5 MG) BY MOUTH DAILY  . metoprolol tartrate (LOPRESSOR) 25 MG tablet TAKE 1/2 TABLET(12.5 MG) BY MOUTH TWICE DAILY  . nitroGLYCERIN (NITROSTAT) 0.4 MG SL tablet Dissolve one tablet under tongue as needed for chest pain.  Marland Kitchen REPATHA 140 MG/ML SOSY Inject 140 mg into the skin every 14 (fourteen) days.  . rosuvastatin (CRESTOR) 40 MG tablet Take 1 tablet (40 mg total) by mouth daily at 6 PM.  . senna (SENOKOT) 8.6 MG TABS tablet Take 1 tablet (8.6 mg total) by mouth daily.  . Tdap (BOOSTRIX) 5-2.5-18.5 LF-MCG/0.5 injection Inject 0.5 mLs into the muscle once.   No facility-administered encounter medications on file as of 04/07/2020.    Review of Systems  HENT: Positive for nosebleeds. Negative for congestion, rhinorrhea, sinus pressure, sinus pain, sneezing and sore throat.   Respiratory: Negative for cough, chest tightness, shortness of breath and wheezing.   Cardiovascular: Negative for chest pain, palpitations and leg swelling.  Gastrointestinal: Negative for abdominal distention, abdominal pain, constipation, diarrhea, nausea and vomiting.  Musculoskeletal: Positive for arthralgias and back pain. Negative for gait problem, joint swelling and myalgias.  Skin: Negative for color change and pallor.  Neurological: Negative for dizziness, seizures, speech difficulty, weakness, light-headedness and headaches.  Hematological: Does not bruise/bleed easily.  Psychiatric/Behavioral: Negative for agitation, behavioral problems and sleep disturbance. The patient is not  nervous/anxious.     Immunization History  Administered Date(s) Administered  . PFIZER SARS-COV-2 Vaccination 08/13/2019, 09/06/2019  . Pneumococcal Conjugate-13 01/14/2020   Pertinent  Health Maintenance Due  Topic Date Due  . FOOT EXAM  Never done  . OPHTHALMOLOGY EXAM  Never done  . INFLUENZA VACCINE  Never done  . HEMOGLOBIN A1C  08/30/2020  . PNA vac Low Risk Adult (2 of 2 - PPSV23) 01/13/2021  . COLONOSCOPY  01/03/2028   Fall Risk  04/07/2020 03/07/2020 01/14/2020 10/08/2019  Falls in the past year? 0 0 0 0  Number falls in past yr: 0 0 0 0  Injury with Fall? 0 0 0 0   Functional Status Survey:    Vitals:   04/07/20 1507  BP: (!) 148/100  Pulse: 99  Resp: 18  Temp: (!) 97.5 F (36.4 C)  SpO2: 99%  Weight: 257 lb 3.2 oz (116.7 kg)  Height: 5\' 6"  (1.676 m)   Body mass index is 41.51 kg/m. Physical Exam Vitals reviewed.  Constitutional:      General: He is not in acute distress.    Appearance: He is morbidly obese. He is not ill-appearing.  HENT:  Head: Normocephalic.     Right Ear: Tympanic membrane, ear canal and external ear normal. There is no impacted cerumen.     Left Ear: Tympanic membrane and external ear normal.     Ears:     Comments: Left ear partial cerumen impacted     Nose: Nose normal. No congestion or rhinorrhea.     Mouth/Throat:     Mouth: Mucous membranes are moist.     Pharynx: Oropharynx is clear. No oropharyngeal exudate or posterior oropharyngeal erythema.  Eyes:     General: No scleral icterus.       Right eye: No discharge.        Left eye: No discharge.     Extraocular Movements: Extraocular movements intact.     Conjunctiva/sclera: Conjunctivae normal.     Pupils: Pupils are equal, round, and reactive to light.  Neck:     Vascular: No carotid bruit.  Cardiovascular:     Rate and Rhythm: Normal rate and regular rhythm.     Pulses: Normal pulses.     Heart sounds: Normal heart sounds. No murmur heard.  No friction rub. No  gallop.   Pulmonary:     Effort: Pulmonary effort is normal. No respiratory distress.     Breath sounds: Normal breath sounds. No wheezing, rhonchi or rales.  Chest:     Chest wall: No tenderness.  Abdominal:     General: Bowel sounds are normal. There is no distension.     Palpations: Abdomen is soft. There is no mass.     Tenderness: There is no abdominal tenderness. There is no right CVA tenderness, left CVA tenderness, guarding or rebound.  Musculoskeletal:        General: No swelling or tenderness.     Cervical back: Normal range of motion. No rigidity or tenderness.     Right lower leg: No edema.     Left lower leg: No edema.     Comments: Abnormal gait   Lymphadenopathy:     Cervical: No cervical adenopathy.  Skin:    General: Skin is warm and dry.     Coloration: Skin is not pale.     Findings: No bruising, erythema or rash.  Neurological:     Mental Status: He is alert and oriented to person, place, and time.     Cranial Nerves: No cranial nerve deficit.     Sensory: No sensory deficit.     Motor: No weakness.     Coordination: Coordination normal.  Psychiatric:        Mood and Affect: Mood normal.        Behavior: Behavior normal.        Thought Content: Thought content normal.        Judgment: Judgment normal.    Labs reviewed: Recent Labs    09/21/19 0501 09/21/19 0501 09/29/19 0000 01/10/20 0846 03/02/20 0914  NA 135  --  135* 134* 139  K 4.5   < > 4.5 4.7 4.6  CL 106   < > 101 99 108  CO2 21*   < > 19 26 23   GLUCOSE 220*  --   --  380* 103*  BUN 23  --  28* 30* 30*  CREATININE 1.75*  --  1.7* 1.77* 1.43*  CALCIUM 9.3   < > 9.5 9.4 9.2   < > = values in this interval not displayed.   Recent Labs    09/16/19 0835 01/10/20 0846 03/02/20 0914  AST  17 20 42*  ALT 22 24 59*  ALKPHOS 87  --   --   BILITOT 1.0 0.6 0.4  PROT 7.6 7.1 7.0  ALBUMIN 3.8  --   --    Recent Labs    09/21/19 0501 01/10/20 0846 03/02/20 0914  WBC 8.7 7.9 8.8   NEUTROABS 5.6 5,356 4,778  HGB 13.3 14.7 15.2  HCT 39.4 44.3 45.8  MCV 88.1 89.3 89.6  PLT 135* 153 163   Lab Results  Component Value Date   TSH 4.56 (H) 03/02/2020   Lab Results  Component Value Date   HGBA1C 8.8 (H) 03/02/2020   Lab Results  Component Value Date   CHOL 128 03/02/2020   HDL 49 03/02/2020   LDLCALC 63 03/02/2020   TRIG 84 03/02/2020   CHOLHDL 2.6 03/02/2020    Significant Diagnostic Results in last 30 days:  No results found.  Assessment/Plan 1. Epistaxis Reports nose bleed x 1 day but none today.No headache or dizziness.B/p high today.Advised to take blood pressure medication as prescribed.Attributes nose bleed with newly prescribed medication from heart specialist though daughter states has not received any new medication.patient does not recall the medication's name.I've discussed with him to take medication as prescribed since his blood pressure is high today.    - CBC with Differential/Platelet  2. Upper back pain on right side Reports right upper back pain worst with breathing.No cough . Bilateral Lungs CTA with diminish bases. - DG Chest 2 View; Future  3. Bedbug bite, initial encounter Has multiple areas of possible insect bite in a linear position on the legs arms and back.Reports bed bug in his apartment.has called specialist to terminate bugs. - advised to wash all clothes,beddings in warm water and soap.  Calamine lotion for itching.Advised to avoid Benadryl due to increase for fall risk.May take OTC Loratadine 10 mg tablet daily.  - calamine lotion; Apply 1 application topically as needed for itching.  Dispense: 120 mL; Refill: 0  Family/ staff Communication: Reviewed plan of care with patient and daughter   Labs/tests ordered: - DG Chest 2 View; Future  Next Appointment: Has scheduled appointment.   Sandrea Hughs, NP

## 2020-04-07 NOTE — Patient Instructions (Addendum)
Please get Chest X-ray done at [facility] then will call you with results  Wash all bedding with hot water and soap

## 2020-04-08 ENCOUNTER — Other Ambulatory Visit: Payer: Self-pay | Admitting: Adult Health

## 2020-04-08 LAB — CBC WITH DIFFERENTIAL/PLATELET
Absolute Monocytes: 624 cells/uL (ref 200–950)
Basophils Absolute: 31 cells/uL (ref 0–200)
Basophils Relative: 0.4 %
Eosinophils Absolute: 69 cells/uL (ref 15–500)
Eosinophils Relative: 0.9 %
HCT: 41.5 % (ref 38.5–50.0)
Hemoglobin: 13.9 g/dL (ref 13.2–17.1)
Lymphs Abs: 2310 cells/uL (ref 850–3900)
MCH: 30.4 pg (ref 27.0–33.0)
MCHC: 33.5 g/dL (ref 32.0–36.0)
MCV: 90.8 fL (ref 80.0–100.0)
MPV: 10.9 fL (ref 7.5–12.5)
Monocytes Relative: 8.1 %
Neutro Abs: 4666 cells/uL (ref 1500–7800)
Neutrophils Relative %: 60.6 %
Platelets: 149 10*3/uL (ref 140–400)
RBC: 4.57 10*6/uL (ref 4.20–5.80)
RDW: 14.1 % (ref 11.0–15.0)
Total Lymphocyte: 30 %
WBC: 7.7 10*3/uL (ref 3.8–10.8)

## 2020-04-28 ENCOUNTER — Other Ambulatory Visit: Payer: Self-pay | Admitting: Family

## 2020-04-28 DIAGNOSIS — I129 Hypertensive chronic kidney disease with stage 1 through stage 4 chronic kidney disease, or unspecified chronic kidney disease: Secondary | ICD-10-CM

## 2020-04-28 DIAGNOSIS — N183 Chronic kidney disease, stage 3 unspecified: Secondary | ICD-10-CM

## 2020-05-02 ENCOUNTER — Other Ambulatory Visit: Payer: Self-pay

## 2020-05-02 DIAGNOSIS — N183 Chronic kidney disease, stage 3 unspecified: Secondary | ICD-10-CM

## 2020-05-02 DIAGNOSIS — I129 Hypertensive chronic kidney disease with stage 1 through stage 4 chronic kidney disease, or unspecified chronic kidney disease: Secondary | ICD-10-CM

## 2020-05-02 MED ORDER — LOSARTAN POTASSIUM 25 MG PO TABS: ORAL_TABLET | ORAL | 2 refills | Status: DC

## 2020-05-04 ENCOUNTER — Other Ambulatory Visit: Payer: Self-pay | Admitting: Family

## 2020-05-04 DIAGNOSIS — Z8673 Personal history of transient ischemic attack (TIA), and cerebral infarction without residual deficits: Secondary | ICD-10-CM

## 2020-05-05 ENCOUNTER — Other Ambulatory Visit: Payer: Self-pay | Admitting: *Deleted

## 2020-05-05 DIAGNOSIS — M8949 Other hypertrophic osteoarthropathy, multiple sites: Secondary | ICD-10-CM

## 2020-05-05 DIAGNOSIS — M159 Polyosteoarthritis, unspecified: Secondary | ICD-10-CM

## 2020-05-05 DIAGNOSIS — G894 Chronic pain syndrome: Secondary | ICD-10-CM

## 2020-05-05 DIAGNOSIS — M7022 Olecranon bursitis, left elbow: Secondary | ICD-10-CM

## 2020-05-05 NOTE — Telephone Encounter (Signed)
Patient requested refill Epic LR: 04/06/2020 Contract on [employer] [facility] and sent to [employer] for approval.

## 2020-05-08 ENCOUNTER — Other Ambulatory Visit: Payer: Medicare HMO

## 2020-05-08 ENCOUNTER — Other Ambulatory Visit: Payer: Self-pay

## 2020-05-08 ENCOUNTER — Other Ambulatory Visit: Payer: Self-pay | Admitting: *Deleted

## 2020-05-08 DIAGNOSIS — G894 Chronic pain syndrome: Secondary | ICD-10-CM

## 2020-05-08 DIAGNOSIS — I129 Hypertensive chronic kidney disease with stage 1 through stage 4 chronic kidney disease, or unspecified chronic kidney disease: Secondary | ICD-10-CM

## 2020-05-08 DIAGNOSIS — R748 Abnormal levels of other serum enzymes: Secondary | ICD-10-CM

## 2020-05-08 DIAGNOSIS — M159 Polyosteoarthritis, unspecified: Secondary | ICD-10-CM

## 2020-05-08 DIAGNOSIS — E1142 Type 2 diabetes mellitus with diabetic polyneuropathy: Secondary | ICD-10-CM

## 2020-05-08 DIAGNOSIS — M150 Primary generalized (osteo)arthritis: Secondary | ICD-10-CM

## 2020-05-08 DIAGNOSIS — M7022 Olecranon bursitis, left elbow: Secondary | ICD-10-CM

## 2020-05-08 DIAGNOSIS — M8949 Other hypertrophic osteoarthropathy, multiple sites: Secondary | ICD-10-CM

## 2020-05-08 DIAGNOSIS — N183 Chronic kidney disease, stage 3 unspecified: Secondary | ICD-10-CM

## 2020-05-08 MED ORDER — HYDROCODONE-ACETAMINOPHEN 10-325 MG PO TABS: 1.0000 | ORAL_TABLET | Freq: Three times a day (TID) | ORAL | 0 refills | Status: DC | PRN

## 2020-05-08 NOTE — Telephone Encounter (Signed)
Patient requested refill Contract on File Epic LR: 04/06/2020 Pended Rx and sent to [facility] for approval.

## 2020-05-09 LAB — CBC WITH DIFFERENTIAL/PLATELET
Absolute Monocytes: 863 cells/uL (ref 200–950)
Basophils Absolute: 53 cells/uL (ref 0–200)
Basophils Relative: 0.6 %
Eosinophils Absolute: 142 cells/uL (ref 15–500)
Eosinophils Relative: 1.6 %
HCT: 44.5 % (ref 38.5–50.0)
Hemoglobin: 14.9 g/dL (ref 13.2–17.1)
Lymphs Abs: 2804 cells/uL (ref 850–3900)
MCH: 30.4 pg (ref 27.0–33.0)
MCHC: 33.5 g/dL (ref 32.0–36.0)
MCV: 90.8 fL (ref 80.0–100.0)
MPV: 11.1 fL (ref 7.5–12.5)
Monocytes Relative: 9.7 %
Neutro Abs: 5037 cells/uL (ref 1500–7800)
Neutrophils Relative %: 56.6 %
Platelets: 156 10*3/uL (ref 140–400)
RBC: 4.9 10*6/uL (ref 4.20–5.80)
RDW: 13.8 % (ref 11.0–15.0)
Total Lymphocyte: 31.5 %
WBC: 8.9 10*3/uL (ref 3.8–10.8)

## 2020-05-09 LAB — COMPLETE METABOLIC PANEL WITH GFR
AG Ratio: 1.2 (calc) (ref 1.0–2.5)
ALT: 75 U/L — ABNORMAL HIGH (ref 9–46)
AST: 78 U/L — ABNORMAL HIGH (ref 10–35)
Albumin: 3.8 g/dL (ref 3.6–5.1)
Alkaline phosphatase (APISO): 89 U/L (ref 35–144)
BUN/Creatinine Ratio: 18 (calc) (ref 6–22)
BUN: 29 mg/dL — ABNORMAL HIGH (ref 7–25)
CO2: 22 mmol/L (ref 20–32)
Calcium: 9.2 mg/dL (ref 8.6–10.3)
Chloride: 111 mmol/L — ABNORMAL HIGH (ref 98–110)
Creat: 1.59 mg/dL — ABNORMAL HIGH (ref 0.70–1.18)
GFR, Est African American: 49 mL/min/{1.73_m2} — ABNORMAL LOW (ref >=60)
GFR, Est Non African American: 42 mL/min/{1.73_m2} — ABNORMAL LOW (ref >=60)
Globulin: 3.3 g/dL (calc) (ref 1.9–3.7)
Glucose, Bld: 64 mg/dL — ABNORMAL LOW (ref 65–99)
Potassium: 4.7 mmol/L (ref 3.5–5.3)
Sodium: 142 mmol/L (ref 135–146)
Total Bilirubin: 0.5 mg/dL (ref 0.2–1.2)
Total Protein: 7.1 g/dL (ref 6.1–8.1)

## 2020-05-09 LAB — LIPID PANEL
Cholesterol: 91 mg/dL (ref <200)
HDL: 60 mg/dL (ref >=40)
LDL Cholesterol (Calc): 16 mg/dL (calc)
Non-HDL Cholesterol (Calc): 31 mg/dL (calc) (ref <130)
Total CHOL/HDL Ratio: 1.5 (calc) (ref <5.0)
Triglycerides: 70 mg/dL (ref <150)

## 2020-05-09 LAB — HEMOGLOBIN A1C
Hgb A1c MFr Bld: 6.6 % of total Hgb — ABNORMAL HIGH (ref <5.7)
Mean Plasma Glucose: 143 mg/dL
eAG (mmol/L): 7.9 mmol/L

## 2020-05-09 LAB — HEPATIC FUNCTION PANEL
AG Ratio: 1.2 (calc) (ref 1.0–2.5)
ALT: 75 U/L — ABNORMAL HIGH (ref 9–46)
AST: 78 U/L — ABNORMAL HIGH (ref 10–35)
Albumin: 3.8 g/dL (ref 3.6–5.1)
Alkaline phosphatase (APISO): 89 U/L (ref 35–144)
Bilirubin, Direct: 0.2 mg/dL (ref 0.0–0.2)
Globulin: 3.3 g/dL (calc) (ref 1.9–3.7)
Indirect Bilirubin: 0.3 mg/dL (calc) (ref 0.2–1.2)
Total Bilirubin: 0.5 mg/dL (ref 0.2–1.2)
Total Protein: 7.1 g/dL (ref 6.1–8.1)

## 2020-05-09 LAB — TSH: TSH: 3.02 mIU/L (ref 0.40–4.50)

## 2020-05-11 ENCOUNTER — Other Ambulatory Visit: Payer: Self-pay

## 2020-05-11 ENCOUNTER — Ambulatory Visit (INDEPENDENT_AMBULATORY_CARE_PROVIDER_SITE_OTHER): Payer: Medicare HMO | Admitting: Family

## 2020-05-11 ENCOUNTER — Encounter: Payer: Self-pay | Admitting: Family

## 2020-05-11 VITALS — BP 130/88 | HR 75 | Temp 97.7°F | Resp 18 | Ht 66.0 in | Wt 261.6 lb

## 2020-05-11 DIAGNOSIS — E1142 Type 2 diabetes mellitus with diabetic polyneuropathy: Secondary | ICD-10-CM

## 2020-05-11 DIAGNOSIS — N1832 Chronic kidney disease, stage 3b: Secondary | ICD-10-CM | POA: Diagnosis not present

## 2020-05-11 DIAGNOSIS — R748 Abnormal levels of other serum enzymes: Secondary | ICD-10-CM

## 2020-05-11 NOTE — Progress Notes (Signed)
Provider: Marlowe Sax FNP-C  Shatira Dobosz, [name], NP  Patient Care Team: Acacia Latorre, [name], NP as PCP - General (Family Medicine)  Extended Emergency Contact Information Primary Emergency Contact: Derk, Doubek Mobile Phone: [phone] Relation: Daughter  Code Status:  Full Code  Goals of care: Advanced Directive information Advanced Directives 05/11/2020  Does Patient Have a Medical Advance Directive? No  Type of Advance Directive -  Does patient want to make changes to medical advance directive? No - Patient declined  Copy of [place] in Chart? -  Would patient like information on creating a medical advance directive? -     Chief Complaint  Patient presents with  . Medical Management of Chronic Issues    Follow up/Discuss lab results.    HPI:  Pt is a 74 y.o. male seen today for an acute visit for evaluation of abnormal labs results.His lab work reviewed and discussed today. Hgb A1 C 6.6 improved from previous 8.8 >12.9 >10.8 his glucose was low at 64. He states CBG are in the 80's -180.sometimes eats a snack at bedtime and at times not.He denies any signs of hypoglycemia.  BUN 29 and CR 1.59 previous 1.43; 1.77and 1.7 Has been taking Advil 1-2 tablets three times daily along with her Norco.   His liver Enzyme continues to be elevated AST 78 and ALT 75.States drinks 4-5 beers per week.Currently on Crestor 40 mg tablet daily.His cholesterol 91,HDL 49,TRG 70,LDL 16 I did consult with his Cardiologist Dr.Tolia Sunit okay holding Crestor and encourage him to stop drinking then re-evaluate liver enzyme or send to GI.recommended restarting Crestor at 20 mg tablet at bedtime.    He has been doing a little walking. Plans to get back to the [facility] in January,2022.    Past Medical History:  Diagnosis Date  . Bell palsy   . CKD (chronic kidney disease), stage III ([place]) 12/16/2018  . Coronary artery disease involving native coronary artery of native heart  with unstable angina pectoris ([place]) 12/16/2018  . GERD (gastroesophageal reflux disease)   . Hypertension   . Hypertension with heart disease 12/16/2018  . Myocardial infarction ([place])   . Pure hypercholesterolemia 12/16/2018  . Stroke ([place])   . Type 2 diabetes, controlled, with peripheral neuropathy ([place]) 12/16/2018   Past Surgical History:  Procedure Laterality Date  . CARDIAC SURGERY    . COLONOSCOPY WITH PROPOFOL N/A 01/02/2018   Procedure: COLONOSCOPY WITH PROPOFOL;  Surgeon: Carol Ada, MD;  [facility];  Service: Endoscopy;  Laterality: N/A;  . CORONARY STENT INTERVENTION N/A 01/26/2019   Procedure: CORONARY STENT INTERVENTION;  Surgeon: Adrian Prows, MD;  Location: [facility];  Service: Cardiovascular;  Laterality: N/A;  . LEFT HEART CATH AND CORS/GRAFTS ANGIOGRAPHY N/A 01/05/2019   Procedure: LEFT HEART CATH AND CORS/GRAFTS ANGIOGRAPHY;  Surgeon: Adrian Prows, MD;  Location: [facility];  Service: Cardiovascular;  Laterality: N/A;  . POLYPECTOMY  01/02/2018   Procedure: POLYPECTOMY;  Surgeon: Carol Ada, MD;  [facility];  Service: Endoscopy;;  . stents      No Known Allergies  Outpatient Encounter Medications as of 05/11/2020  Medication Sig  . amLODipine (NORVASC) 5 MG tablet Take 1 tablet (5 mg total) by mouth daily.  . bisacodyl (DULCOLAX) 10 MG suppository Place 10 mg rectally daily as needed for moderate constipation.  . calamine lotion Apply 1 application topically as needed for itching.  . clopidogrel (PLAVIX) 75 MG tablet Take 1 tablet (75 mg total) by  mouth daily.  . Dulaglutide (TRULICITY) 3.41 DQ/2.2WL SOPN Administer 0.65m under the skin One time a Week.  . ezetimibe (ZETIA) 10 MG tablet TAKE 1 TABLET(10 MG) BY MOUTH DAILY  . glucose blood (ACCU-CHEK AVIVA PLUS) test strip Use to test blood sugar 3 times daily. Dx: E11.42  . HYDROcodone-acetaminophen (NORCO) 10-325 MG tablet Take 1 tablet by mouth 3 (three) times daily as needed for  up to 30 doses for moderate pain.  . Ibuprofen 200 MG CAPS Take 1 capsule by mouth as needed.  . insulin aspart (NOVOLOG FLEXPEN) 100 UNIT/ML FlexPen INJECT 8-10 UNITS INTO THE SKIN PER SLIDING SCALE THREE TIMES DAILY  . insulin detemir (LEVEMIR) 100 UNIT/ML FlexPen Inject 20 Units into the skin at bedtime.  . Insulin Pen Needle 32G X 4 MM MISC 1 pen by Does not apply route 3 (three) times daily.  . isosorbide mononitrate (IMDUR) 60 MG 24 hr tablet Take 1 tablet (60 mg total) by mouth daily.  .Marland Kitchenlosartan (COZAAR) 25 MG tablet TAKE 1/2 TABLET(12.5 MG) BY MOUTH DAILY  . metoprolol tartrate (LOPRESSOR) 25 MG tablet TAKE 1/2 TABLET(12.5 MG) BY MOUTH TWICE DAILY  . nitroGLYCERIN (NITROSTAT) 0.4 MG SL tablet Dissolve one tablet under tongue as needed for chest pain.  .Marland KitchenREPATHA 140 MG/ML SOSY Inject 140 mg into the skin every 14 (fourteen) days.  .Marland Kitchensenna (SENOKOT) 8.6 MG TABS tablet Take 1 tablet (8.6 mg total) by mouth daily.  . Tdap (BOOSTRIX) 5-2.5-18.5 LF-MCG/0.5 injection Inject 0.5 mLs into the muscle once.  . [DISCONTINUED] rosuvastatin (CRESTOR) 40 MG tablet Take 1 tablet (40 mg total) by mouth daily at 6 PM.   No facility-administered encounter medications on file as of 05/11/2020.    Review of Systems  Constitutional: Negative for appetite change, chills, fatigue and fever.  HENT: Negative for congestion, rhinorrhea, sinus pressure, sinus pain, sneezing, sore throat and trouble swallowing.   Respiratory: Negative for cough, chest tightness, shortness of breath and wheezing.   Cardiovascular: Negative for chest pain, palpitations and leg swelling.  Gastrointestinal: Negative for abdominal distention, abdominal pain, constipation, diarrhea, nausea and vomiting.  Endocrine: Negative for cold intolerance, heat intolerance, polydipsia, polyphagia and polyuria.  Genitourinary: Negative for difficulty urinating, dysuria, flank pain, frequency and urgency.  Musculoskeletal: Positive for  arthralgias, back pain and gait problem.  Skin: Negative for color change, pallor and rash.  Neurological: Negative for dizziness, speech difficulty, weakness, light-headedness and headaches.  Psychiatric/Behavioral: Negative for agitation, behavioral problems and sleep disturbance. The patient is not nervous/anxious.     Immunization History  Administered Date(s) Administered  . PFIZER SARS-COV-2 Vaccination 08/13/2019, 09/06/2019  . Pneumococcal Conjugate-13 01/14/2020   Pertinent  Health Maintenance Due  Topic Date Due  . FOOT EXAM  Never done  . OPHTHALMOLOGY EXAM  Never done  . INFLUENZA VACCINE  Never done  . HEMOGLOBIN A1C  11/06/2020  . PNA vac Low Risk Adult (2 of 2 - PPSV23) 01/13/2021  . COLONOSCOPY  01/03/2028   Fall Risk  05/11/2020 04/07/2020 03/07/2020 01/14/2020 10/08/2019  Falls in the past year? 0 0 0 0 0  Number falls in past yr: 0 0 0 0 0  Injury with Fall? 0 0 0 0 0   Functional Status Survey:    Vitals:   05/11/20 1305  BP: 130/88  Pulse: 75  Resp: 18  Temp: 97.7 F (36.5 C)  SpO2: 98%  Weight: 261 lb 9.6 oz (118.7 kg)  Height: _0  (1.676 m)   Body  mass index is 42.22 kg/m. Physical Exam Constitutional:      General: He is not in acute distress.    Appearance: He is obese. He is not ill-appearing.  HENT:     Head: Normocephalic.     Mouth/Throat:     Mouth: Mucous membranes are moist.     Pharynx: Oropharynx is clear. No oropharyngeal exudate or posterior oropharyngeal erythema.  Eyes:     General: No scleral icterus.       Right eye: No discharge.        Left eye: No discharge.     Conjunctiva/sclera: Conjunctivae normal.     Pupils: Pupils are equal, round, and reactive to light.  Neck:     Vascular: No carotid bruit.  Cardiovascular:     Rate and Rhythm: Normal rate and regular rhythm.     Pulses: Normal pulses.     Heart sounds: Normal heart sounds. No murmur heard. No friction rub. No gallop.   Pulmonary:     Effort: Pulmonary  effort is normal. No respiratory distress.     Breath sounds: Normal breath sounds. No wheezing, rhonchi or rales.  Chest:     Chest wall: No tenderness.  Abdominal:     General: Bowel sounds are normal. There is no distension.     Palpations: Abdomen is soft.     Tenderness: There is no abdominal tenderness. There is no right CVA tenderness, left CVA tenderness, guarding or rebound.  Musculoskeletal:        General: No swelling or tenderness.     Cervical back: Normal range of motion. No rigidity or tenderness.     Right lower leg: No edema.     Left lower leg: No edema.     Comments: Unsteady gait   Lymphadenopathy:     Cervical: No cervical adenopathy.  Skin:    General: Skin is warm and dry.     Coloration: Skin is not pale.     Findings: No bruising, erythema or rash.  Neurological:     Mental Status: He is alert and oriented to person, place, and time.     Cranial Nerves: No cranial nerve deficit.     Motor: No weakness.     Coordination: Coordination normal.     Gait: Gait abnormal.  Psychiatric:        Mood and Affect: Mood normal.        Behavior: Behavior normal.        Thought Content: Thought content normal.        Judgment: Judgment normal.    Labs reviewed: Recent Labs    01/10/20 0846 03/02/20 0914 05/08/20 0828  NA 134* 139 142  K 4.7 4.6 4.7  CL 99 108 111*  CO2 _0 GLUCOSE 380* 103* 64*  BUN 30* 30* 29*  CREATININE 1.77* 1.43* 1.59*  CALCIUM 9.4 9.2 9.2   Recent Labs    09/16/19 0835 01/10/20 0846 03/02/20 0914 05/08/20 0828  AST 17 20 42* 78*  78*  ALT 22 24 59* 75*  75*  ALKPHOS 87  --   --   --   BILITOT 1.0 0.6 0.4 0.5  0.5  PROT 7.6 7.1 7.0 7.1  7.1  ALBUMIN 3.8  --   --   --    Recent Labs    03/02/20 0914 04/07/20 1558 05/08/20 0828  WBC 8.8 7.7 8.9  NEUTROABS 4,778 4,666 5,037  HGB 15.2 13.9 14.9  HCT 45.8 41.5  44.5  MCV 89.6 90.8 90.8  PLT 163 149 156   Lab Results  Component Value Date   TSH 3.02  05/08/2020   Lab Results  Component Value Date   HGBA1C 6.6 (H) 05/08/2020   Lab Results  Component Value Date   CHOL 91 05/08/2020   HDL 60 05/08/2020   LDLCALC 16 05/08/2020   TRIG 70 05/08/2020   CHOLHDL 1.5 05/08/2020    Significant Diagnostic Results in last 30 days:  No results found.  Assessment/Plan 1. Elevated liver enzymes ALT 75,AST 78  Drinks 4-5 beers per week.Alcohol cessation advised discussed at length risk for alcohol use.Plans to quit. - appreciate Cardiologist Dr. [name] consult will discontinue Crestor 40 mg tablet for now then recheck hepatic panel in 2 weeks if improved will restart Crestor at 20 mg tablet at bedtime.  If worsening will refer to Gastroenterologist.  - CMP with eGFR([facility]); Future  2. Type 2 diabetes, controlled, with peripheral neuropathy ([place]) Well controlled.did not bring his  CBG log but recalls readings in the 80's -180's.No symptoms of hypoglycemia.  - Advised to continue to monitor CBG and notify provider if readings are below 75    3. Stage 3b chronic kidney disease (HCC) BUN 29 and CR 1.59 previous 1.43; 1.77and 1.7 has been taking 1-2 tablets of Advil three times daily.Advised to avoid Naproxen,Aleve,Advil and Ibuprofen.  - CMP with eGFR([facility]); Future   Family/ staff Communication: Reviewed plan of care with patient  Labs/tests ordered: - CMP with eGFR([facility]); Future  Next Appointment: 2 weeks to recheck [place], NP

## 2020-05-11 NOTE — Patient Instructions (Signed)
-   Stop Crestor 40 mg tablet until we recheck you liver function. - Do not take any Advil,Aleve or naproxen or ibuprofen due to you kidney function decline.

## 2020-05-15 ENCOUNTER — Other Ambulatory Visit: Payer: Medicare HMO

## 2020-05-25 ENCOUNTER — Other Ambulatory Visit: Payer: Medicare HMO

## 2020-06-01 ENCOUNTER — Other Ambulatory Visit: Payer: Self-pay

## 2020-06-01 ENCOUNTER — Other Ambulatory Visit: Payer: Medicare HMO

## 2020-06-01 DIAGNOSIS — R748 Abnormal levels of other serum enzymes: Secondary | ICD-10-CM

## 2020-06-01 DIAGNOSIS — N1832 Chronic kidney disease, stage 3b: Secondary | ICD-10-CM

## 2020-06-01 LAB — COMPLETE METABOLIC PANEL WITH GFR
AG Ratio: 1.2 (calc) (ref 1.0–2.5)
ALT: 107 U/L — ABNORMAL HIGH (ref 9–46)
AST: 57 U/L — ABNORMAL HIGH (ref 10–35)
Albumin: 3.7 g/dL (ref 3.6–5.1)
Alkaline phosphatase (APISO): 78 U/L (ref 35–144)
BUN/Creatinine Ratio: 17 (calc) (ref 6–22)
BUN: 25 mg/dL (ref 7–25)
CO2: 24 mmol/L (ref 20–32)
Calcium: 9.4 mg/dL (ref 8.6–10.3)
Chloride: 106 mmol/L (ref 98–110)
Creat: 1.44 mg/dL — ABNORMAL HIGH (ref 0.70–1.18)
GFR, Est African American: 55 mL/min/{1.73_m2} — ABNORMAL LOW (ref >=60)
GFR, Est Non African American: 48 mL/min/{1.73_m2} — ABNORMAL LOW (ref >=60)
Globulin: 3 g/dL (calc) (ref 1.9–3.7)
Glucose, Bld: 103 mg/dL — ABNORMAL HIGH (ref 65–99)
Potassium: 4.4 mmol/L (ref 3.5–5.3)
Sodium: 137 mmol/L (ref 135–146)
Total Bilirubin: 0.5 mg/dL (ref 0.2–1.2)
Total Protein: 6.7 g/dL (ref 6.1–8.1)

## 2020-06-05 ENCOUNTER — Telehealth: Payer: Self-pay

## 2020-06-05 DIAGNOSIS — G894 Chronic pain syndrome: Secondary | ICD-10-CM

## 2020-06-05 DIAGNOSIS — M8949 Other hypertrophic osteoarthropathy, multiple sites: Secondary | ICD-10-CM

## 2020-06-05 DIAGNOSIS — M159 Polyosteoarthritis, unspecified: Secondary | ICD-10-CM

## 2020-06-05 DIAGNOSIS — M7022 Olecranon bursitis, left elbow: Secondary | ICD-10-CM

## 2020-06-05 MED ORDER — HYDROCODONE-ACETAMINOPHEN 10-325 MG PO TABS: 1.0000 | ORAL_TABLET | Freq: Three times a day (TID) | ORAL | 0 refills | Status: DC | PRN

## 2020-06-05 NOTE — Telephone Encounter (Signed)
Patient states he is out of his hydrocodone and needs a refill. I informed patient that according to our records hydrocodone was last filled on 05/08/2020 and the earliest it can be refilled in 06/07/2020.  Patient states he told Dinah at his last visit that he takes hydrocodone 4 time a day versus three times a day (as listed on medication list). Patient states he always runs out early because she has not changed the instructions on his medication list.  I informed patient that he signed a contract on 10/08/2019 which states he would not request medications prior to the due date and states If I run out of my medication before the date of the next refill, I understand that my doctor will not provide extra medication.  Patient reiterated that if Dinah would change the instructions he would not have any issues when it comes time to refill  Please review and advise   RX is pending, if instructions are changed patient will need to sign a new contract with updated instructions.

## 2020-06-05 NOTE — Telephone Encounter (Signed)
Below is Dinah's message sent via routing comment:    If needing more than three time per day I recommend referral to pain management clinic.    I spoke with patient, patient states he is not going to a pain clinic because they are not covered by his insurance company.  Patient scheduled an appointment for next Monday to further discuss. Patient states he would like for Dinah to fill rx for the next scheduled due dat which is 06/07/2020.

## 2020-06-12 ENCOUNTER — Encounter: Payer: Self-pay | Admitting: Family

## 2020-06-12 ENCOUNTER — Other Ambulatory Visit: Payer: Self-pay

## 2020-06-12 ENCOUNTER — Ambulatory Visit (INDEPENDENT_AMBULATORY_CARE_PROVIDER_SITE_OTHER): Payer: Medicare Other | Admitting: Family

## 2020-06-12 VITALS — BP 120/80 | HR 84 | Temp 97.1°F | Resp 18 | Ht 66.0 in | Wt 259.2 lb

## 2020-06-12 DIAGNOSIS — G894 Chronic pain syndrome: Secondary | ICD-10-CM | POA: Diagnosis not present

## 2020-06-12 DIAGNOSIS — R748 Abnormal levels of other serum enzymes: Secondary | ICD-10-CM | POA: Diagnosis not present

## 2020-06-12 DIAGNOSIS — M8949 Other hypertrophic osteoarthropathy, multiple sites: Secondary | ICD-10-CM

## 2020-06-12 DIAGNOSIS — M159 Polyosteoarthritis, unspecified: Secondary | ICD-10-CM

## 2020-06-12 NOTE — Progress Notes (Signed)
Provider: Marlowe Sax FNP-C  , [name], NP  Patient Care Team: , [name], NP as PCP - General (Family Medicine)  Extended Emergency Contact Information Primary Emergency Contact: Surafel, Hilleary Mobile Phone: [phone] Relation: Daughter  Code Status:  DNR Goals of care: Advanced Directive information Advanced Directives 06/12/2020  Does Patient Have a Medical Advance Directive? No  Type of Advance Directive -  Does patient want to make changes to medical advance directive? No - Patient declined  Copy of [place] in Chart? -  Would patient like information on creating a medical advance directive? -     Chief Complaint  Patient presents with  . Acute Visit    Wants to discuss pain medication.    HPI:  Pt is a 75 y.o. male seen today for an acute visit to discuss pain medication.He is currently on Norco 10-325 mg tablet three times daily as needed.He states usually takes up to four times daily.He request medication direction to four times daily instead of three times. His latest liver enzymes were elevated.Has been advised on previous visit to cut down on beer intake.he states not drinking much now days.Had a beer once a week. Discussed option for referral to either pain management or Orthopedic for his arthritic lower back and right knee pain.He declines referral to both specialist.He would like to continue on current pain regimen.     Past Medical History:  Diagnosis Date  . Bell palsy   . CKD (chronic kidney disease), stage III ([place]) 12/16/2018  . Coronary artery disease involving native coronary artery of native heart with unstable angina pectoris ([place]) 12/16/2018  . GERD (gastroesophageal reflux disease)   . Hypertension   . Hypertension with heart disease 12/16/2018  . Myocardial infarction ([place])   . Pure hypercholesterolemia 12/16/2018  . Stroke ([place])   . Type 2 diabetes, controlled, with peripheral neuropathy ([place]) 12/16/2018    Past Surgical History:  Procedure Laterality Date  . CARDIAC SURGERY    . COLONOSCOPY WITH PROPOFOL N/A 01/02/2018   Procedure: COLONOSCOPY WITH PROPOFOL;  Surgeon: Carol Ada, MD;  Location: [facility];  Service: Endoscopy;  Laterality: N/A;  . CORONARY STENT INTERVENTION N/A 01/26/2019   Procedure: CORONARY STENT INTERVENTION;  Surgeon: Adrian Prows, MD;  Location: [facility];  Service: Cardiovascular;  Laterality: N/A;  . LEFT HEART CATH AND CORS/GRAFTS ANGIOGRAPHY N/A 01/05/2019   Procedure: LEFT HEART CATH AND CORS/GRAFTS ANGIOGRAPHY;  Surgeon: Adrian Prows, MD;  Location: [facility];  Service: Cardiovascular;  Laterality: N/A;  . POLYPECTOMY  01/02/2018   Procedure: POLYPECTOMY;  Surgeon: Carol Ada, MD;  [facility]: [facility];  Service: Endoscopy;;  . stents      No Known Allergies  Outpatient Encounter Medications as of 06/12/2020  Medication Sig  . amLODipine (NORVASC) 5 MG tablet Take 1 tablet (5 mg total) by mouth daily.  . bisacodyl (DULCOLAX) 10 MG suppository Place 10 mg rectally daily as needed for moderate constipation.  . calamine lotion Apply 1 application topically as needed for itching.  . clopidogrel (PLAVIX) 75 MG tablet Take 1 tablet (75 mg total) by mouth daily.  . Dulaglutide (TRULICITY) 9.03 ES/9.2ZR SOPN Administer 0.53m under the skin One time a Week.  . ezetimibe (ZETIA) 10 MG tablet TAKE 1 TABLET(10 MG) BY MOUTH DAILY  . glucose blood (ACCU-CHEK AVIVA PLUS) test strip Use to test blood sugar 3 times daily. Dx: E11.42  . HYDROcodone-acetaminophen (NORCO) 10-325 MG tablet Take 1 tablet by mouth  3 (three) times daily as needed for up to 30 doses for moderate pain.  . Ibuprofen 200 MG CAPS Take 1 capsule by mouth as needed.  . insulin aspart (NOVOLOG FLEXPEN) 100 UNIT/ML FlexPen INJECT 8-10 UNITS INTO THE SKIN PER SLIDING SCALE THREE TIMES DAILY  . insulin detemir (LEVEMIR) 100 UNIT/ML FlexPen Inject 20 Units into the skin at bedtime.  .  Insulin Pen Needle 32G X 4 MM MISC 1 pen by Does not apply route 3 (three) times daily.  . isosorbide mononitrate (IMDUR) 60 MG 24 hr tablet Take 1 tablet (60 mg total) by mouth daily.  Marland Kitchen losartan (COZAAR) 25 MG tablet TAKE 1/2 TABLET(12.5 MG) BY MOUTH DAILY  . metoprolol tartrate (LOPRESSOR) 25 MG tablet TAKE 1/2 TABLET(12.5 MG) BY MOUTH TWICE DAILY  . nitroGLYCERIN (NITROSTAT) 0.4 MG SL tablet Dissolve one tablet under tongue as needed for chest pain.  Marland Kitchen REPATHA 140 MG/ML SOSY Inject 140 mg into the skin every 14 (fourteen) days.  Marland Kitchen senna (SENOKOT) 8.6 MG TABS tablet Take 1 tablet (8.6 mg total) by mouth daily.  . Tdap (BOOSTRIX) 5-2.5-18.5 LF-MCG/0.5 injection Inject 0.5 mLs into the muscle once.   No facility-administered encounter medications on file as of 06/12/2020.    Review of Systems  Constitutional: Negative for appetite change, chills, fatigue and fever.  Respiratory: Negative for cough, chest tightness, shortness of breath and wheezing.   Cardiovascular: Negative for chest pain, palpitations and leg swelling.  Gastrointestinal: Negative for abdominal distention, abdominal pain, constipation, diarrhea, nausea and vomiting.  Musculoskeletal: Positive for arthralgias, back pain and gait problem. Negative for myalgias, neck pain and neck stiffness.  Skin: Negative for color change, pallor and rash.  Neurological: Negative for dizziness, speech difficulty, weakness, light-headedness, numbness and headaches.  Hematological: Does not bruise/bleed easily.    Immunization History  Administered Date(s) Administered  . PFIZER SARS-COV-2 Vaccination 08/13/2019, 09/06/2019  . Pneumococcal Conjugate-13 01/14/2020   Pertinent  Health Maintenance Due  Topic Date Due  . FOOT EXAM  Never done  . OPHTHALMOLOGY EXAM  Never done  . INFLUENZA VACCINE  Never done  . HEMOGLOBIN A1C  11/06/2020  . PNA vac Low Risk Adult (2 of 2 - PPSV23) 01/13/2021  . COLONOSCOPY (Pts 45-51yr Insurance  coverage will need to be confirmed)  01/03/2028   Fall Risk  06/12/2020 05/11/2020 04/07/2020 03/07/2020 01/14/2020  Falls in the past year? 0 0 0 0 0  Number falls in past yr: 0 0 0 0 0  Injury with Fall? 0 0 0 0 0   Functional Status Survey:    Vitals:   06/12/20 1337  BP: 120/80  Pulse: 84  Resp: 18  Temp: (!) 97.1 F (36.2 C)  SpO2: 94%  Weight: 259 lb 3.2 oz (117.6 kg)  Height: 5' 6" (1.676 m)   Body mass index is 41.84 kg/m. Physical Exam Constitutional:      General: He is not in acute distress.    Appearance: He is obese. He is not ill-appearing.  Eyes:     General: No scleral icterus.       Right eye: No discharge.        Left eye: No discharge.     Extraocular Movements: Extraocular movements intact.     Conjunctiva/sclera: Conjunctivae normal.     Pupils: Pupils are equal, round, and reactive to light.  Cardiovascular:     Rate and Rhythm: Normal rate and regular rhythm.     Pulses: Normal pulses.  Heart sounds: Normal heart sounds. No murmur heard. No friction rub. No gallop.   Pulmonary:     Effort: Pulmonary effort is normal. No respiratory distress.     Breath sounds: Normal breath sounds. No wheezing, rhonchi or rales.  Chest:     Chest wall: No tenderness.  Abdominal:     General: Bowel sounds are normal. There is no distension.     Palpations: Abdomen is soft. There is no mass.     Tenderness: There is no abdominal tenderness. There is no right CVA tenderness, left CVA tenderness, guarding or rebound.  Musculoskeletal:        General: No swelling or tenderness.     Right lower leg: No edema.     Left lower leg: No edema.  Skin:    General: Skin is warm and dry.     Coloration: Skin is not pale.     Findings: No bruising, erythema or rash.  Neurological:     Mental Status: He is alert. Mental status is at baseline.     Cranial Nerves: No cranial nerve deficit.     Sensory: No sensory deficit.     Motor: No weakness.     Coordination:  Coordination normal.     Gait: Gait normal.  Psychiatric:        Mood and Affect: Mood normal.        Behavior: Behavior normal.        Thought Content: Thought content normal.        Judgment: Judgment normal.     Labs reviewed: Recent Labs    03/02/20 0914 05/08/20 0828 06/01/20 1424  NA 139 142 137  K 4.6 4.7 4.4  CL 108 111* 106  CO2 _0 GLUCOSE 103* 64* 103*  BUN 30* 29* 25  CREATININE 1.43* 1.59* 1.44*  CALCIUM 9.2 9.2 9.4   Recent Labs    09/16/19 0835 01/10/20 0846 03/02/20 0914 05/08/20 0828 06/01/20 1424  AST 17   < > 42* 78*  78* 57*  ALT 22   < > 59* 75*  75* 107*  ALKPHOS 87  --   --   --   --   BILITOT 1.0   < > 0.4 0.5  0.5 0.5  PROT 7.6   < > 7.0 7.1  7.1 6.7  ALBUMIN 3.8  --   --   --   --    < > = values in this interval not displayed.   Recent Labs    03/02/20 0914 04/07/20 1558 05/08/20 0828  WBC 8.8 7.7 8.9  NEUTROABS 4,778 4,666 5,037  HGB 15.2 13.9 14.9  HCT 45.8 41.5 44.5  MCV 89.6 90.8 90.8  PLT 163 149 156   Lab Results  Component Value Date   TSH 3.02 05/08/2020   Lab Results  Component Value Date   HGBA1C 6.6 (H) 05/08/2020   Lab Results  Component Value Date   CHOL 91 05/08/2020   HDL 60 05/08/2020   LDLCALC 16 05/08/2020   TRIG 70 05/08/2020   CHOLHDL 1.5 05/08/2020    Significant Diagnostic Results in last 30 days:  No results found.  Assessment/Plan  1. Chronic pain syndrome Reports lower back and right knee arthritic pain.Request Norco to be increased to four times daily.Negative exam finding.discussed option for referral to pain management or Orthopedic since he is already on high dose of Norco with elevated liver enzymes.He declined pain management clinic states not covered by insurance.Also  declined Orthopedic referral.will remain on current pain regimen for now. Topical patch also recommended.Overall drug risk overdose reviewed and discussed with him verbalized understanding. Narcotic use  contract renewed today.   - Urine Drug Screen w/Alc, no confirm([facility])  2. Primary osteoarthritis involving multiple joints Continue on current regimen. - Urine Drug Screen w/Alc, no confirm([facility])  3. Elevated liver enzymes Latest liver enzymes AST 57,ALT 107  Reports has cut down on drinking to one beer per week.will recheck lab work.Has been taking Norco 4 times a day instead of 3 times which could be also contributing to elevated enzymes.requested Norco to be increased but ive discussed with him that should be weaning off Norco due to his high liver enzymes and drinking.want to remain on Norco three times daily PRN  - BMP with eGFR([facility]) - Hepatic function panel    Family/ staff Communication: Reviewed plan of care with patient  Labs/tests ordered: None   Next Appointment:   Sandrea Hughs, NP

## 2020-06-12 NOTE — Patient Instructions (Signed)
-   continue on current pain medication.Notify provider if referral to pain management or Orthopedic specialist desired.  - continue to cut down on beer  - continue with walking exercises daily

## 2020-06-16 LAB — DRUG MONITOR, PANEL 1, W/CONF, URINE
Amphetamines: NEGATIVE ng/mL (ref <500)
Barbiturates: NEGATIVE ng/mL (ref <300)
Benzodiazepines: NEGATIVE ng/mL (ref <100)
Cocaine Metabolite: NEGATIVE ng/mL (ref <150)
Codeine: NEGATIVE ng/mL (ref <50)
Creatinine: 312.7 mg/dL
Hydrocodone: 4991 ng/mL — ABNORMAL HIGH (ref <50)
Hydromorphone: 1550 ng/mL — ABNORMAL HIGH (ref <50)
Marijuana Metabolite: NEGATIVE ng/mL (ref <20)
Methadone Metabolite: NEGATIVE ng/mL (ref <100)
Morphine: NEGATIVE ng/mL (ref <50)
Norhydrocodone: 3758 ng/mL — ABNORMAL HIGH (ref <50)
Opiates: POSITIVE ng/mL — AB (ref <100)
Oxidant: NEGATIVE ug/mL
Oxycodone: NEGATIVE ng/mL (ref <100)
Phencyclidine: NEGATIVE ng/mL (ref <25)
pH: 6 (ref 4.5–9.0)

## 2020-06-16 LAB — HEPATIC FUNCTION PANEL
AG Ratio: 1.1 (calc) (ref 1.0–2.5)
ALT: 42 U/L (ref 9–46)
AST: 42 U/L — ABNORMAL HIGH (ref 10–35)
Albumin: 3.8 g/dL (ref 3.6–5.1)
Alkaline phosphatase (APISO): 75 U/L (ref 35–144)
Bilirubin, Direct: 0.1 mg/dL (ref 0.0–0.2)
Globulin: 3.4 g/dL (calc) (ref 1.9–3.7)
Indirect Bilirubin: 0.3 mg/dL (calc) (ref 0.2–1.2)
Total Bilirubin: 0.4 mg/dL (ref 0.2–1.2)
Total Protein: 7.2 g/dL (ref 6.1–8.1)

## 2020-06-16 LAB — BASIC METABOLIC PANEL WITH GFR
BUN/Creatinine Ratio: 17 (calc) (ref 6–22)
BUN: 33 mg/dL — ABNORMAL HIGH (ref 7–25)
CO2: 23 mmol/L (ref 20–32)
Calcium: 8.9 mg/dL (ref 8.6–10.3)
Chloride: 106 mmol/L (ref 98–110)
Creat: 1.93 mg/dL — ABNORMAL HIGH (ref 0.70–1.18)
GFR, Est African American: 39 mL/min/{1.73_m2} — ABNORMAL LOW (ref >=60)
GFR, Est Non African American: 34 mL/min/{1.73_m2} — ABNORMAL LOW (ref >=60)
Glucose, Bld: 147 mg/dL — ABNORMAL HIGH (ref 65–139)
Potassium: 4.6 mmol/L (ref 3.5–5.3)
Sodium: 138 mmol/L (ref 135–146)

## 2020-06-16 LAB — DM TEMPLATE

## 2020-06-26 ENCOUNTER — Ambulatory Visit (INDEPENDENT_AMBULATORY_CARE_PROVIDER_SITE_OTHER): Payer: Medicare Other | Admitting: Family

## 2020-06-26 ENCOUNTER — Encounter: Payer: Self-pay | Admitting: Family

## 2020-06-26 ENCOUNTER — Other Ambulatory Visit: Payer: Self-pay

## 2020-06-26 ENCOUNTER — Other Ambulatory Visit: Payer: Self-pay | Admitting: *Deleted

## 2020-06-26 VITALS — BP 110/88 | HR 93 | Temp 97.3°F | Resp 16 | Ht 66.0 in

## 2020-06-26 DIAGNOSIS — R52 Pain, unspecified: Secondary | ICD-10-CM

## 2020-06-26 DIAGNOSIS — R531 Weakness: Secondary | ICD-10-CM

## 2020-06-26 DIAGNOSIS — R0989 Other specified symptoms and signs involving the circulatory and respiratory systems: Secondary | ICD-10-CM

## 2020-06-26 DIAGNOSIS — E1142 Type 2 diabetes mellitus with diabetic polyneuropathy: Secondary | ICD-10-CM | POA: Diagnosis not present

## 2020-06-26 LAB — POCT INFLUENZA A/B
Influenza A, POC: NEGATIVE
Influenza B, POC: NEGATIVE

## 2020-06-26 LAB — GLUCOSE, POCT (MANUAL RESULT ENTRY): POC Glucose: 268 mg/dl — AB (ref 70–99)

## 2020-06-26 MED ORDER — NOVOLOG FLEXPEN 100 UNIT/ML ~~LOC~~ SOPN: PEN_INJECTOR | SUBCUTANEOUS | 1 refills | Status: DC

## 2020-06-26 MED ORDER — INSULIN DETEMIR 100 UNIT/ML FLEXPEN: 20.0000 [IU] | PEN_INJECTOR | Freq: Every day | SUBCUTANEOUS | 1 refills | Status: DC

## 2020-06-26 NOTE — Telephone Encounter (Signed)
Daughter requested refills to be sent to pharmacy.  Phoned to pharmacy.

## 2020-06-26 NOTE — Progress Notes (Signed)
Provider: Marlowe Sax FNP-C  Torri Michalski, [name], NP  Patient Care Team: Frank Novelo, [name], NP as PCP - General (Family Medicine)  Extended Emergency Contact Information Primary Emergency Contact: Jamiere, Gulas Mobile Phone: [phone] Relation: Daughter  Code Status:  Full Code  Goals of care: Advanced Directive information Advanced Directives 06/26/2020  Does Patient Have a Medical Advance Directive? Yes  Type of Advance Directive Out of facility DNR (pink MOST or yellow form)  Does patient want to make changes to medical advance directive? No - Patient declined  Copy of [place] in Chart? -  Would patient like information on creating a medical advance directive? -     Chief Complaint  Patient presents with  . Acute Visit    Complains of Diarrhea, Fatigue, Fever, Chills,Body aches, and Shortness of Breath x 1 week.    HPI:  Pt is a 75 y.o. male seen today for an acute visit for evaluation of weakness,fever,chills and shortness of breath since 06/19/2020.No shortness of breath noted.Has been around some people but does not know whether they were sick or had their COVID-19 vaccine.  Had diarrhea couple of days ago but has resolved.No abdominal pain.  Also states run out of his Insulin but called in today to be refilled.states CBG was in the 180's today.daughter request CBG checked here today. Daughter states did not know patient had been sick until she went to visit and scheduled an appointment today.she found patient had emptied all his medication on a cup instead of regular pill box she has been filling for two weeks.Thinks since he was not feeling well to get up he empty it on a cup.unclear if he has been taking his medication.     Past Medical History:  Diagnosis Date  . Bell palsy   . CKD (chronic kidney disease), stage III ([place]) 12/16/2018  . Coronary artery disease involving native coronary artery of native heart with unstable angina pectoris  ([place]) 12/16/2018  . GERD (gastroesophageal reflux disease)   . Hypertension   . Hypertension with heart disease 12/16/2018  . Myocardial infarction ([place])   . Pure hypercholesterolemia 12/16/2018  . Stroke ([place])   . Type 2 diabetes, controlled, with peripheral neuropathy ([place]) 12/16/2018   Past Surgical History:  Procedure Laterality Date  . CARDIAC SURGERY    . COLONOSCOPY WITH PROPOFOL N/A 01/02/2018   Procedure: COLONOSCOPY WITH PROPOFOL;  Surgeon: Carol Ada, MD;  [facility];  Service: Endoscopy;  Laterality: N/A;  . CORONARY STENT INTERVENTION N/A 01/26/2019   Procedure: CORONARY STENT INTERVENTION;  Surgeon: Adrian Prows, MD;  Location: [facility];  Service: Cardiovascular;  Laterality: N/A;  . LEFT HEART CATH AND CORS/GRAFTS ANGIOGRAPHY N/A 01/05/2019   Procedure: LEFT HEART CATH AND CORS/GRAFTS ANGIOGRAPHY;  Surgeon: Adrian Prows, MD;  Location: [facility];  Service: Cardiovascular;  Laterality: N/A;  . POLYPECTOMY  01/02/2018   Procedure: POLYPECTOMY;  Surgeon: Carol Ada, MD;  [facility];  Service: Endoscopy;;  . stents      No Known Allergies  Outpatient Encounter Medications as of 06/26/2020  Medication Sig  . amLODipine (NORVASC) 5 MG tablet Take 1 tablet (5 mg total) by mouth daily.  . bisacodyl (DULCOLAX) 10 MG suppository Place 10 mg rectally daily as needed for moderate constipation.  . calamine lotion Apply 1 application topically as needed for itching.  . clopidogrel (PLAVIX) 75 MG tablet Take 1 tablet (75 mg total) by mouth daily.  . Dulaglutide (TRULICITY) 1.21  MG/0.5ML SOPN Administer 0.63m under the skin One time a Week.  . ezetimibe (ZETIA) 10 MG tablet TAKE 1 TABLET(10 MG) BY MOUTH DAILY  . glucose blood (ACCU-CHEK AVIVA PLUS) test strip Use to test blood sugar 3 times daily. Dx: E11.42  . HYDROcodone-acetaminophen (NORCO) 10-325 MG tablet Take 1 tablet by mouth 3 (three) times daily as needed for up to 30 doses for moderate  pain.  .Marland Kitcheninsulin aspart (NOVOLOG FLEXPEN) 100 UNIT/ML FlexPen INJECT 8-10 UNITS INTO THE SKIN PER SLIDING SCALE THREE TIMES DAILY  . insulin detemir (LEVEMIR) 100 UNIT/ML FlexPen Inject 20 Units into the skin at bedtime.  . Insulin Pen Needle 32G X 4 MM MISC 1 pen by Does not apply route 3 (three) times daily.  . isosorbide mononitrate (IMDUR) 60 MG 24 hr tablet Take 1 tablet (60 mg total) by mouth daily.  .Marland Kitchenlosartan (COZAAR) 25 MG tablet TAKE 1/2 TABLET(12.5 MG) BY MOUTH DAILY  . metoprolol tartrate (LOPRESSOR) 25 MG tablet TAKE 1/2 TABLET(12.5 MG) BY MOUTH TWICE DAILY  . nitroGLYCERIN (NITROSTAT) 0.4 MG SL tablet Dissolve one tablet under tongue as needed for chest pain.  .Marland KitchenREPATHA 140 MG/ML SOSY Inject 140 mg into the skin every 14 (fourteen) days.  .Marland Kitchensenna (SENOKOT) 8.6 MG TABS tablet Take 1 tablet (8.6 mg total) by mouth daily.  . [DISCONTINUED] Tdap (BOOSTRIX) 5-2.5-18.5 LF-MCG/0.5 injection Inject 0.5 mLs into the muscle once.   No facility-administered encounter medications on file as of 06/26/2020.    Review of Systems  Constitutional: Positive for chills, fatigue and fever. Negative for appetite change.  HENT: Negative for congestion, sinus pressure, sinus pain, sneezing and sore throat.   Eyes: Negative for pain, discharge, redness and itching.  Respiratory: Positive for cough. Negative for chest tightness and wheezing.        Reports shortness of breath though none today  Cardiovascular: Negative for chest pain and leg swelling.  Gastrointestinal: Negative for abdominal distention, abdominal pain, constipation, diarrhea, nausea and vomiting.       Diarrhea for a couple of days mostly watery but has resolved   Endocrine: Negative for cold intolerance, heat intolerance, polydipsia, polyphagia and polyuria.  Genitourinary: Negative for difficulty urinating, flank pain and urgency.  Musculoskeletal: Positive for arthralgias and back pain. Negative for gait problem, joint swelling and  myalgias.  Skin: Negative for color change, pallor and rash.  Neurological: Negative for dizziness, speech difficulty, light-headedness and headaches.  Hematological: Does not bruise/bleed easily.  Psychiatric/Behavioral: Negative for agitation, behavioral problems and sleep disturbance. The patient is not nervous/anxious.     Immunization History  Administered Date(s) Administered  . PFIZER(Purple Top)SARS-COV-2 Vaccination 08/13/2019, 09/06/2019  . Pneumococcal Conjugate-13 01/14/2020   Pertinent  Health Maintenance Due  Topic Date Due  . FOOT EXAM  Never done  . OPHTHALMOLOGY EXAM  Never done  . INFLUENZA VACCINE  Never done  . HEMOGLOBIN A1C  11/06/2020  . PNA vac Low Risk Adult (2 of 2 - PPSV23) 01/13/2021  . COLONOSCOPY (Pts 45-429yrInsurance coverage will need to be confirmed)  01/03/2028   Fall Risk  06/26/2020 06/12/2020 05/11/2020 04/07/2020 03/07/2020  Falls in the past year? 0 0 0 0 0  Number falls in past yr: 0 0 0 0 0  Injury with Fall? 0 0 0 0 0   Functional Status Survey:    Vitals:   06/26/20 1555  BP: 110/88  Pulse: 93  Resp: 16  Temp: (!) 97.3 F (36.3 C)  SpO2: 97%  Height: 5' 6"  (1.676 m)   Body mass index is 41.84 kg/m. Physical Exam Vitals reviewed.  Constitutional:      General: He is not in acute distress.    Appearance: He is obese. He is not ill-appearing.  HENT:     Right Ear: Tympanic membrane, ear canal and external ear normal. There is no impacted cerumen.     Left Ear: Tympanic membrane, ear canal and external ear normal. There is no impacted cerumen.     Nose: Nose normal. No congestion or rhinorrhea.     Mouth/Throat:     Mouth: Mucous membranes are moist.     Pharynx: Oropharynx is clear. No oropharyngeal exudate or posterior oropharyngeal erythema.  Eyes:     General: No scleral icterus.       Right eye: No discharge.        Left eye: No discharge.     Extraocular Movements: Extraocular movements intact.     Conjunctiva/sclera:  Conjunctivae normal.     Pupils: Pupils are equal, round, and reactive to light.  Neck:     Vascular: No carotid bruit.  Cardiovascular:     Rate and Rhythm: Normal rate and regular rhythm.     Pulses: Normal pulses.     Heart sounds: Normal heart sounds. No murmur heard. No friction rub. No gallop.   Pulmonary:     Effort: Pulmonary effort is normal. No respiratory distress.     Breath sounds: Normal breath sounds. No wheezing, rhonchi or rales.  Chest:     Chest wall: No tenderness.  Abdominal:     General: Bowel sounds are normal. There is no distension.     Palpations: Abdomen is soft. There is no mass.     Tenderness: There is no abdominal tenderness. There is no right CVA tenderness, left CVA tenderness, guarding or rebound.  Musculoskeletal:        General: No swelling or tenderness. Normal range of motion.     Cervical back: Normal range of motion. No rigidity or tenderness.     Right lower leg: No edema.     Left lower leg: No edema.  Lymphadenopathy:     Cervical: No cervical adenopathy.  Skin:    General: Skin is warm.     Coloration: Skin is not pale.     Findings: No bruising, erythema or rash.  Neurological:     Mental Status: He is alert and oriented to person, place, and time.     Cranial Nerves: No cranial nerve deficit.     Sensory: No sensory deficit.     Motor: No weakness.     Coordination: Coordination normal.     Gait: Gait normal.  Psychiatric:        Mood and Affect: Mood normal.        Behavior: Behavior normal.        Thought Content: Thought content normal.        Judgment: Judgment normal.    Labs reviewed: Recent Labs    05/08/20 0828 06/01/20 1424 06/12/20 1429  NA 142 137 138  K 4.7 4.4 4.6  CL 111* 106 106  CO2 22 24 23   GLUCOSE 64* 103* 147*  BUN 29* 25 33*  CREATININE 1.59* 1.44* 1.93*  CALCIUM 9.2 9.4 8.9   Recent Labs    09/16/19 0835 01/10/20 0846 05/08/20 0828 06/01/20 1424 06/12/20 1429  AST 17   < > 78*  78*  57* 42*  ALT 22   < >  75*  75* 107* 42  ALKPHOS 87  --   --   --   --   BILITOT 1.0   < > 0.5  0.5 0.5 0.4  PROT 7.6   < > 7.1  7.1 6.7 7.2  ALBUMIN 3.8  --   --   --   --    < > = values in this interval not displayed.   Recent Labs    03/02/20 0914 04/07/20 1558 05/08/20 0828  WBC 8.8 7.7 8.9  NEUTROABS 4,778 4,666 5,037  HGB 15.2 13.9 14.9  HCT 45.8 41.5 44.5  MCV 89.6 90.8 90.8  PLT 163 149 156   Lab Results  Component Value Date   TSH 3.02 05/08/2020   Lab Results  Component Value Date   HGBA1C 6.6 (H) 05/08/2020   Lab Results  Component Value Date   CHOL 91 05/08/2020   HDL 60 05/08/2020   LDLCALC 16 05/08/2020   TRIG 70 05/08/2020   CHOLHDL 1.5 05/08/2020    Significant Diagnostic Results in last 30 days:  No results found.  Assessment/Plan  1. Symptoms of upper respiratory infection (URI) Afebrile.reports fever,chills,generalized body aches,weakness and cough. Bilateral lung breath sounds clear without any shortness of breath noted.Oral pharynx clear.  - will rule out influenza and COVID-19.made aware will send COVID-19 test to lab will call in 3 days. - SARS-COV-2 RNA,(COVID-19) QUAL NAAT - POC Influenza A/B results negative.   2. Type 2 diabetes, controlled, with peripheral neuropathy ([place]) Has been out of his Insulin but did not call pharmacy or provider's office. - POC Glucose (CBG) 268  - Advised to resume his insulin as directed.continue on Trulicity,Levermir and novolog.  - continue on ARB  3. Generalized weakness Unclear etiology but suspect possible URI will rule out SARS and Influenza and other infectious and metabolic etiologies.   - CBC with Differential/Platelet - CMP with eGFR([facility])  4. Generalized body aches Afebrile. - continue on Tylenol as needed for aches.  - CBC with Differential/Platelet - CMP with eGFR([facility])  Family/ staff Communication: Reviewed plan of care with patient and daughter   Labs/tests ordered:  - CBC  with Differential/Platelet - CMP with eGFR([facility]) - SARS-COV-2 RNA,(COVID-19) QUAL NAAT - POC Influenza A/B   Next Appointment: As needed if symptoms worsen or fail to improve.  Sandrea Hughs, NP

## 2020-06-26 NOTE — Patient Instructions (Signed)
-   Increase fluid intake or soup  - COVID-19 test done today will call you in 3 days for results  - continue on Tylenol as needed for Generalized body aches,fever or chills. - Notify provider if symptoms worsen or fail to improve

## 2020-06-27 ENCOUNTER — Other Ambulatory Visit: Payer: Self-pay | Admitting: *Deleted

## 2020-06-27 DIAGNOSIS — E1142 Type 2 diabetes mellitus with diabetic polyneuropathy: Secondary | ICD-10-CM

## 2020-06-27 LAB — CBC WITH DIFFERENTIAL/PLATELET
Absolute Monocytes: 851 cells/uL (ref 200–950)
Basophils Absolute: 41 cells/uL (ref 0–200)
Basophils Relative: 0.5 %
Eosinophils Absolute: 89 cells/uL (ref 15–500)
Eosinophils Relative: 1.1 %
HCT: 42.1 % (ref 38.5–50.0)
Hemoglobin: 14.4 g/dL (ref 13.2–17.1)
Lymphs Abs: 1466 cells/uL (ref 850–3900)
MCH: 30.5 pg (ref 27.0–33.0)
MCHC: 34.2 g/dL (ref 32.0–36.0)
MCV: 89.2 fL (ref 80.0–100.0)
MPV: 11.3 fL (ref 7.5–12.5)
Monocytes Relative: 10.5 %
Neutro Abs: 5654 cells/uL (ref 1500–7800)
Neutrophils Relative %: 69.8 %
Platelets: 275 10*3/uL (ref 140–400)
RBC: 4.72 10*6/uL (ref 4.20–5.80)
RDW: 12.8 % (ref 11.0–15.0)
Total Lymphocyte: 18.1 %
WBC: 8.1 10*3/uL (ref 3.8–10.8)

## 2020-06-27 LAB — COMPLETE METABOLIC PANEL WITH GFR
AG Ratio: 1 (calc) (ref 1.0–2.5)
ALT: 20 U/L (ref 9–46)
AST: 14 U/L (ref 10–35)
Albumin: 3.5 g/dL — ABNORMAL LOW (ref 3.6–5.1)
Alkaline phosphatase (APISO): 75 U/L (ref 35–144)
BUN/Creatinine Ratio: 16 (calc) (ref 6–22)
BUN: 32 mg/dL — ABNORMAL HIGH (ref 7–25)
CO2: 19 mmol/L — ABNORMAL LOW (ref 20–32)
Calcium: 9.4 mg/dL (ref 8.6–10.3)
Chloride: 105 mmol/L (ref 98–110)
Creat: 1.94 mg/dL — ABNORMAL HIGH (ref 0.70–1.18)
GFR, Est African American: 39 mL/min/{1.73_m2} — ABNORMAL LOW (ref >=60)
GFR, Est Non African American: 33 mL/min/{1.73_m2} — ABNORMAL LOW (ref >=60)
Globulin: 3.6 g/dL (calc) (ref 1.9–3.7)
Glucose, Bld: 246 mg/dL — ABNORMAL HIGH (ref 65–139)
Potassium: 4.4 mmol/L (ref 3.5–5.3)
Sodium: 137 mmol/L (ref 135–146)
Total Bilirubin: 0.6 mg/dL (ref 0.2–1.2)
Total Protein: 7.1 g/dL (ref 6.1–8.1)

## 2020-06-27 MED ORDER — INSULIN PEN NEEDLE 32G X 4 MM MISC: 3 refills | Status: DC

## 2020-06-27 NOTE — Telephone Encounter (Signed)
[facility] requested refill.  ? ?

## 2020-06-27 NOTE — Telephone Encounter (Signed)
[facility] 

## 2020-06-28 LAB — SARS-COV-2 RNA,(COVID-19) QUALITATIVE NAAT: SARS CoV2 RNA: DETECTED — CR

## 2020-06-29 ENCOUNTER — Other Ambulatory Visit: Payer: Self-pay | Admitting: Cardiology

## 2020-07-03 ENCOUNTER — Other Ambulatory Visit: Payer: Self-pay | Admitting: Family

## 2020-07-03 DIAGNOSIS — E1142 Type 2 diabetes mellitus with diabetic polyneuropathy: Secondary | ICD-10-CM

## 2020-07-07 ENCOUNTER — Other Ambulatory Visit: Payer: Self-pay | Admitting: *Deleted

## 2020-07-07 DIAGNOSIS — M7022 Olecranon bursitis, left elbow: Secondary | ICD-10-CM

## 2020-07-07 DIAGNOSIS — M159 Polyosteoarthritis, unspecified: Secondary | ICD-10-CM

## 2020-07-07 DIAGNOSIS — M150 Primary generalized (osteo)arthritis: Secondary | ICD-10-CM

## 2020-07-07 DIAGNOSIS — G894 Chronic pain syndrome: Secondary | ICD-10-CM

## 2020-07-07 DIAGNOSIS — M8949 Other hypertrophic osteoarthropathy, multiple sites: Secondary | ICD-10-CM

## 2020-07-07 NOTE — Telephone Encounter (Signed)
Patient requested medication. Left message on Clinical intake voicemail.  Epic LR: 06/05/2020 Contract on [facility] and sent to [employer] for approval.

## 2020-07-07 NOTE — Telephone Encounter (Signed)
Unable to e-scribe Norco

## 2020-07-09 ENCOUNTER — Other Ambulatory Visit: Payer: Self-pay | Admitting: Family

## 2020-07-09 DIAGNOSIS — M159 Polyosteoarthritis, unspecified: Secondary | ICD-10-CM

## 2020-07-09 DIAGNOSIS — M8949 Other hypertrophic osteoarthropathy, multiple sites: Secondary | ICD-10-CM

## 2020-07-09 DIAGNOSIS — M7022 Olecranon bursitis, left elbow: Secondary | ICD-10-CM

## 2020-07-09 DIAGNOSIS — G894 Chronic pain syndrome: Secondary | ICD-10-CM

## 2020-07-09 MED ORDER — HYDROCODONE-ACETAMINOPHEN 10-325 MG PO TABS: 1.0000 | ORAL_TABLET | Freq: Three times a day (TID) | ORAL | 0 refills | Status: DC | PRN

## 2020-07-09 NOTE — Progress Notes (Signed)
Hydrocodone Acetaminophen script send to pharmacy.

## 2020-07-10 NOTE — Telephone Encounter (Signed)
Called pharmacy and they have confirmed they received Rx. And it is ready for pick up.   [name], caregiver notified and agreed.

## 2020-08-03 ENCOUNTER — Other Ambulatory Visit: Payer: Self-pay | Admitting: *Deleted

## 2020-08-03 DIAGNOSIS — M7022 Olecranon bursitis, left elbow: Secondary | ICD-10-CM

## 2020-08-03 DIAGNOSIS — M8949 Other hypertrophic osteoarthropathy, multiple sites: Secondary | ICD-10-CM

## 2020-08-03 DIAGNOSIS — G894 Chronic pain syndrome: Secondary | ICD-10-CM

## 2020-08-03 DIAGNOSIS — M150 Primary generalized (osteo)arthritis: Secondary | ICD-10-CM

## 2020-08-03 DIAGNOSIS — M159 Polyosteoarthritis, unspecified: Secondary | ICD-10-CM

## 2020-08-03 IMAGING — MR MR MRA HEAD W/O CM
1 series · 20 of 48 positions shown · non-contrast
Comparison: Brain MRI same day

CLINICAL DATA: Ataxia

EXAM:
MRA HEAD WITHOUT CONTRAST
TECHNIQUE: Angiographic images of the Circle of Willis were obtained using MRA
technique without intravenous contrast.

[Series 2: ax (id) · axial · 1.0mm · 0.43mm/px · z∈[-87,+0]mm · 20 of 184 slices shown]
[im 1/184]
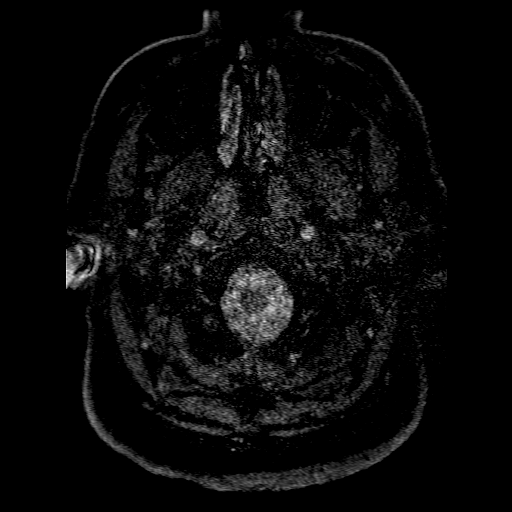
[im 4/184]
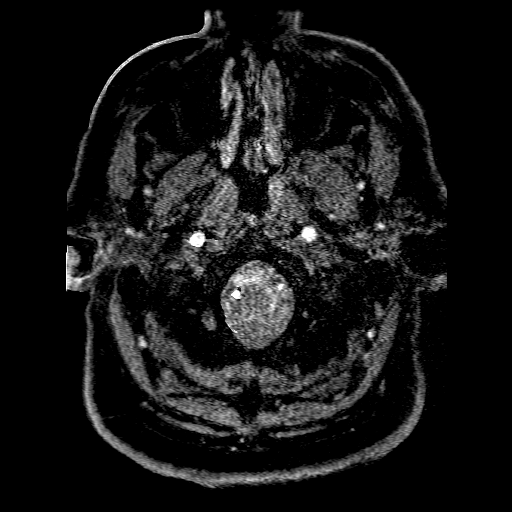
[im 8/184]
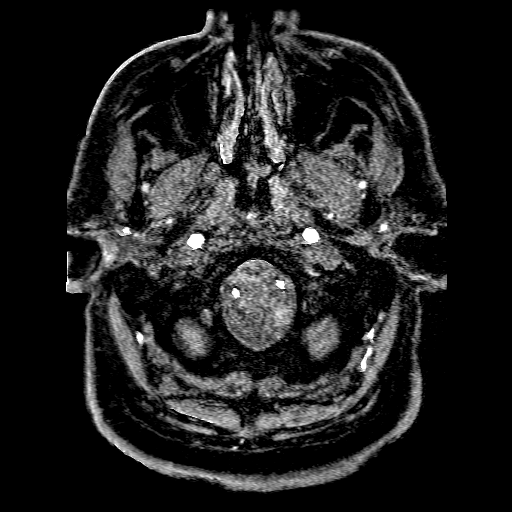
[im 12/184]
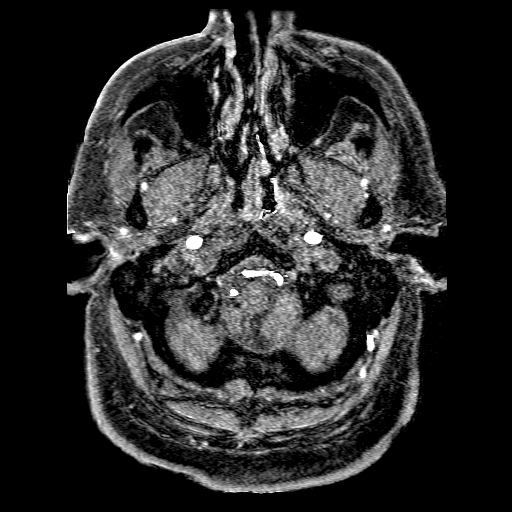
[im 16/184]
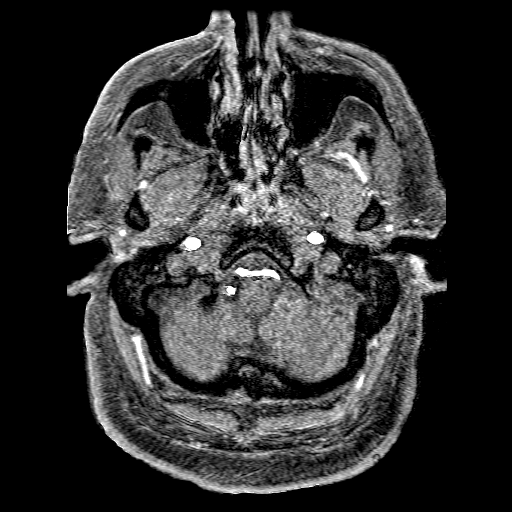
[im 20/184]
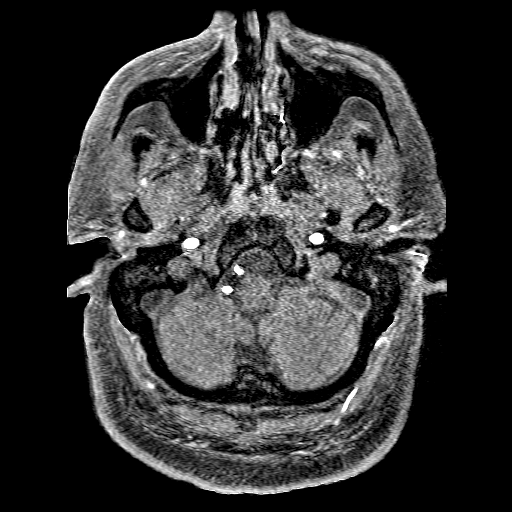
[im 24/184]
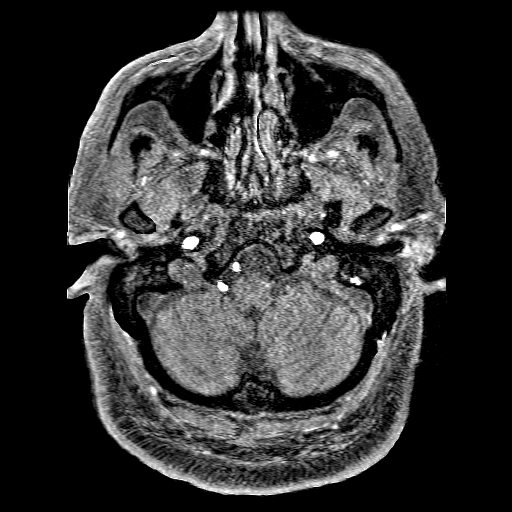
[im 28/184]
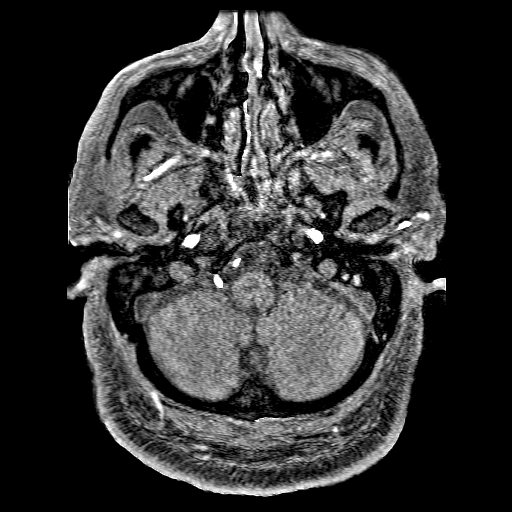
[im 32/184]
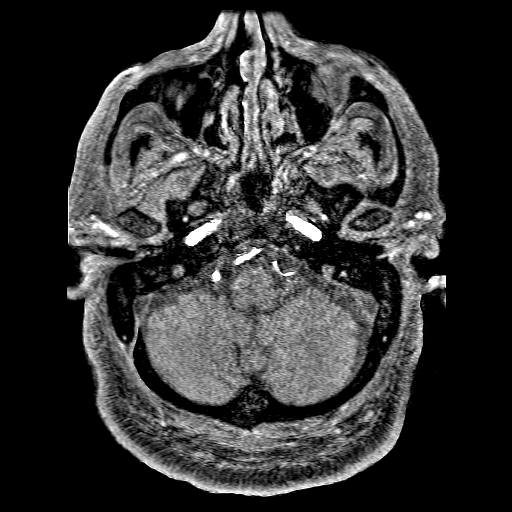
[im 36/184]
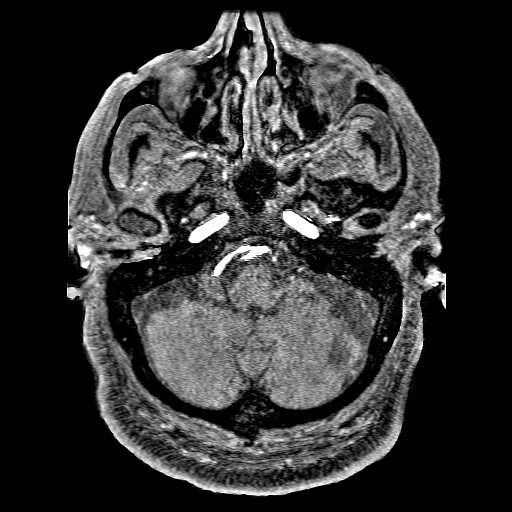
[im 39/184]
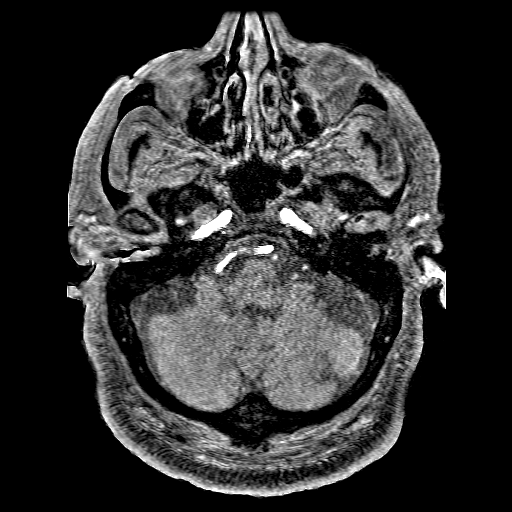
[im 43/184]
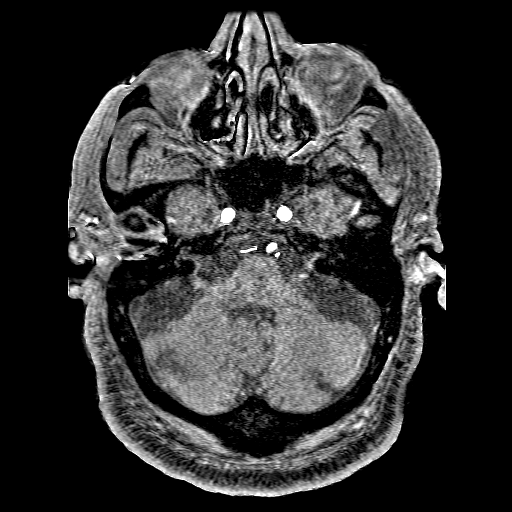
[im 59/184]
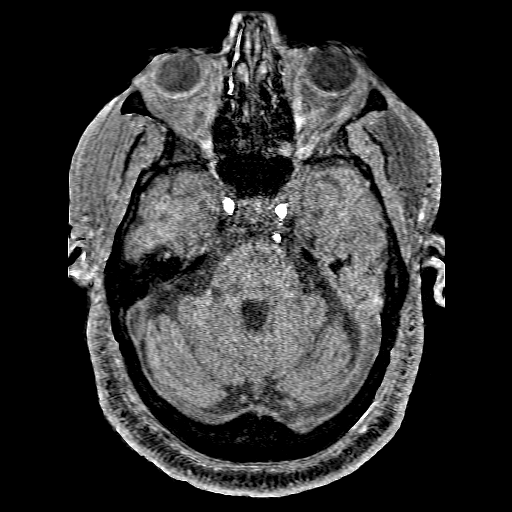
[im 82/184]
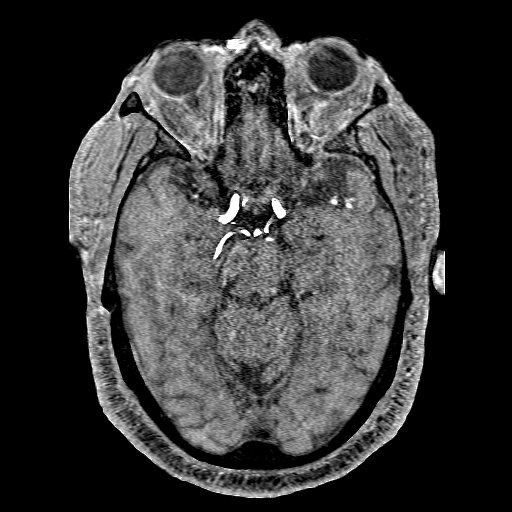
[im 94/184]
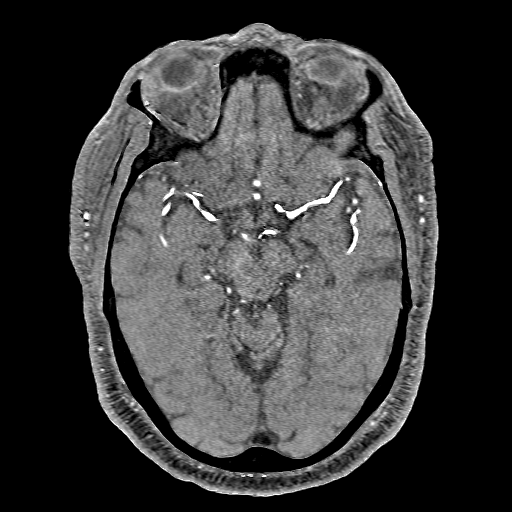
[im 106/184]
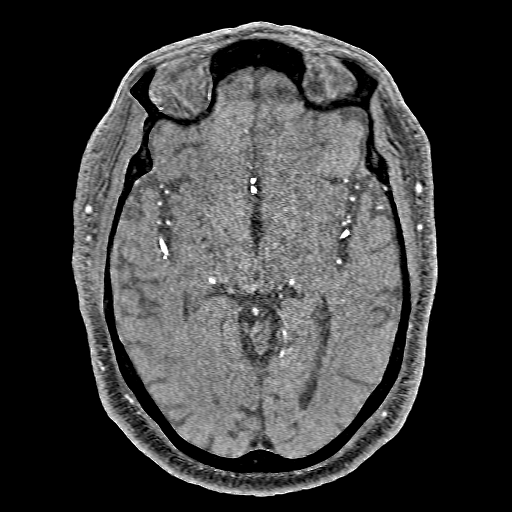
[im 129/184]
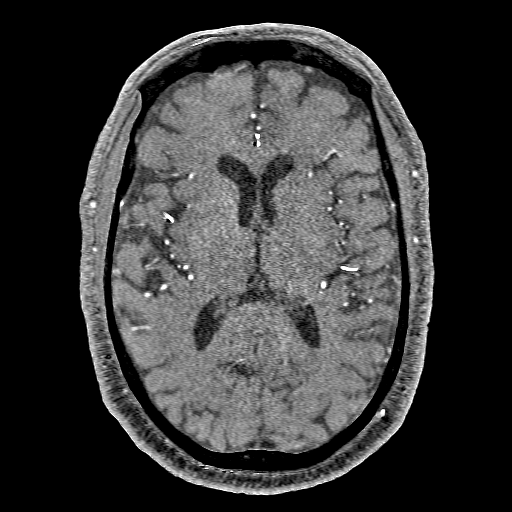
[im 152/184]
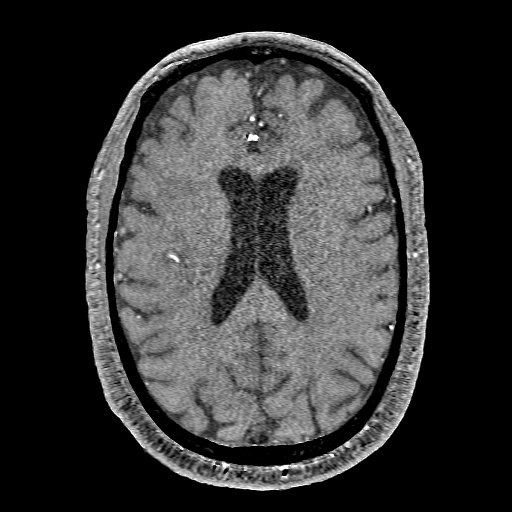
[im 156/184]
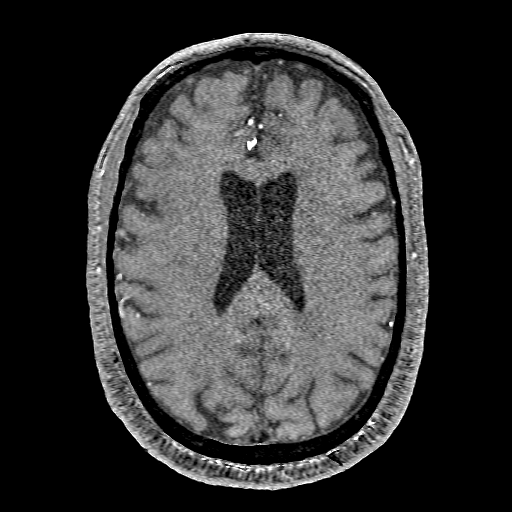
[im 176/184]
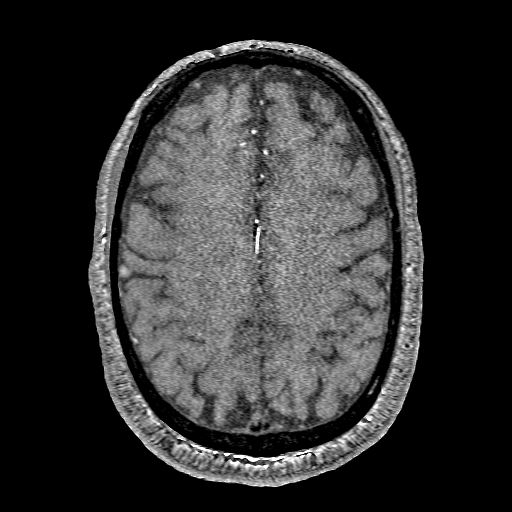

[20 of 48 positions shown; findings below may reference images not displayed]

FINDINGS: POSTERIOR CIRCULATION:

--Vertebral arteries: Normal V4 segments.

--Posterior inferior cerebellar arteries (PICA): Patent origins from
the vertebral arteries.

--Anterior inferior cerebellar arteries (AICA): Patent origins from
the basilar artery.

--Basilar artery: Normal.

--Superior cerebellar arteries: Normal.

--Posterior cerebral arteries: Normal. The left PCA is predominantly
supplied by the posterior communicating artery.

ANTERIOR CIRCULATION:

--Intracranial internal carotid arteries: Normal.

--Anterior cerebral arteries (ACA): Normal. Both A1 segments are
present. Patent anterior communicating artery (a-comm).

--Middle cerebral arteries (MCA): Normal.
IMPRESSION: Normal brain MRA.

## 2020-08-03 NOTE — Telephone Encounter (Signed)
Received fax from [facility] requesting Prior Authorization for Novolog.  Initiated through [employer] Key: [place] into Determination.

## 2020-08-04 MED ORDER — HYDROCODONE-ACETAMINOPHEN 10-325 MG PO TABS: 1.0000 | ORAL_TABLET | Freq: Three times a day (TID) | ORAL | 0 refills | Status: AC | PRN

## 2020-08-04 MED ORDER — INSULIN LISPRO (1 UNIT DIAL) 100 UNIT/ML (KWIKPEN): PEN_INJECTOR | SUBCUTANEOUS | 3 refills | Status: DC

## 2020-08-04 NOTE — Telephone Encounter (Signed)
Will need a prior Authorization.Novolog Flexpen medically necessary for patient due to inability to draw insulin

## 2020-08-04 NOTE — Telephone Encounter (Signed)
Patient notified and agreed about the insulin  Patient also requested refill on his pain medication.  Contract on File Epic LR: 07/09/2020 Pended Rx's and sent to [facility] for approval.

## 2020-08-04 NOTE — Telephone Encounter (Signed)
Okay,please switch to Humalog KwiKpen 8 - 10 units SQ per Sliding scale three times daily with meals.

## 2020-08-04 NOTE — Addendum Note (Signed)
Addended by: Rafael Bihari A on: 08/04/2020 02:58 PM   Modules accepted: Orders

## 2020-08-04 NOTE — Telephone Encounter (Signed)
Prior Authorization was Done and was DENIED.  They state that patient has to try the Humalog Claiborne Rigg before they will approve the [employer].  Please Advise.

## 2020-08-04 NOTE — Telephone Encounter (Addendum)
Received fax from [place] is DENIED.  Patient has to try and fail Humalog cartridge, Humalog Junior KwikPen, Humalog KwikPen, Insulin Lispro KwikPen or Lyumjev Kwikpen.  Placed fax for Denial in Dinah's folder to review.

## 2020-08-09 ENCOUNTER — Telehealth: Payer: Self-pay | Admitting: Family

## 2020-08-09 ENCOUNTER — Telehealth: Payer: Self-pay

## 2020-08-09 DIAGNOSIS — E1142 Type 2 diabetes mellitus with diabetic polyneuropathy: Secondary | ICD-10-CM

## 2020-08-09 MED ORDER — INSULIN ASPART 100 UNIT/ML ~~LOC~~ SOLN: 8.0000 [IU] | Freq: Three times a day (TID) | SUBCUTANEOUS | 99 refills | Status: DC

## 2020-08-09 NOTE — Telephone Encounter (Signed)
Please obtain prior Authorization for Novolog 100 units / ml inject 8-10 units SQ three times with meals per sliding scale.

## 2020-08-09 NOTE — Telephone Encounter (Signed)
PA form completed manually to the best of my knowledge, attached last OV note and labs.  Form placed in David Burton's review and sign folder to complete last section as I do not know how to appropriately answer those questions. Once complete forms to be placed in outgoing fax for administrative staff to further process, then to scanning

## 2020-08-09 NOTE — Telephone Encounter (Signed)
Incoming fax received from [employer] to initiate PA for David Burton is not on patient current medication list. I will send to [name], Lerna, NP to review and advise.  If I need to proceed with prior authorization for Novolog the provider will need to add it to the medication list with all pertinent details and diagnosis

## 2020-08-09 NOTE — Telephone Encounter (Signed)
Discontinue Humalog 100 units/ml inject 8-10 units SQ three times per sliding scale orders once prior Authorization is obtained for Novolog.

## 2020-08-10 NOTE — Telephone Encounter (Signed)
Prior Authorization Faxed to [facility]. Fax: [phone]

## 2020-08-14 NOTE — Telephone Encounter (Signed)
Novolog is not covered by patient's insurance. Patient was switched to Humalog on 08/04/2020.   Medication list updated and Dinah agreed.      Note Refill dated 08/03/2020:  Note Patient notified and agreed about the insulin  Patient also requested refill on his pain medication.  Contract on [employer] LR: 07/09/2020 Pended Rx's and sent to [place] for approval.         2:39 PM [name], [name], NP routed this conversation to Me    [name], [name], NP    2:39 PM Note Okay,please switch to Humalog KwiKpen 8 - 10 units SQ per Sliding scale three times daily with meals.         2:35 PM You routed this conversation to [name], [name], NP    Me    2:35 PM Note Prior Authorization was Done and was DENIED.  They state that patient has to try the Humalog Claiborne Rigg before they will approve the [employer].  Please Advise.

## 2020-08-14 NOTE — Telephone Encounter (Signed)
Received a standard appeal from [facility] for patient's Novolog which requires a non formulary exception.   Authorization placed in Dinah's folder to review, fill out and sign.  Member ID: [id number] Case: [id number]

## 2020-08-24 ENCOUNTER — Telehealth: Payer: Self-pay

## 2020-08-24 NOTE — Telephone Encounter (Signed)
Patients daughter called stating patient has a dental procedure pending for tomorrow at 11 am and she needs David Burton to complete the form that was sent over on Tuesday ASAP to avoid delay in care.  I placed Terri on a brief hold to confirm form was received. Form was on David Burton's desk for completion and I had a verbal conversation with Webb Silversmith that form needed to be addressed as soon as possible.  Patients daughter would like a return call once form is completed and faxed back

## 2020-08-24 NOTE — Telephone Encounter (Signed)
David Burton brought form to my desk and stated that she filled out her portion but the Cardiologist Would have to clear the Plavix 75mg . (Note on form stating this)  Faxed form back to Fax: [phone]  Daughter notified.    Form sent for scanning.

## 2020-08-30 ENCOUNTER — Ambulatory Visit (INDEPENDENT_AMBULATORY_CARE_PROVIDER_SITE_OTHER): Payer: Medicare Other | Admitting: Family

## 2020-08-30 ENCOUNTER — Other Ambulatory Visit: Payer: Self-pay

## 2020-08-30 ENCOUNTER — Encounter: Payer: Self-pay | Admitting: Family

## 2020-08-30 VITALS — BP 138/88 | HR 73 | Temp 97.3°F | Resp 18 | Ht 66.0 in

## 2020-08-30 DIAGNOSIS — J029 Acute pharyngitis, unspecified: Secondary | ICD-10-CM

## 2020-08-30 LAB — POCT RAPID STREP A (OFFICE): Rapid Strep A Screen: NEGATIVE

## 2020-08-30 MED ORDER — SACCHAROMYCES BOULARDII 250 MG PO CAPS: 250.0000 mg | ORAL_CAPSULE | Freq: Two times a day (BID) | ORAL | 0 refills | Status: DC

## 2020-08-30 MED ORDER — AMOXICILLIN-POT CLAVULANATE 875-125 MG PO TABS: 1.0000 | ORAL_TABLET | Freq: Two times a day (BID) | ORAL | 0 refills | Status: DC

## 2020-08-30 NOTE — Patient Instructions (Addendum)
-   please increase your water intake or soup   - Gurgle throat with warm water and salt twice daily and as needed for 3 days.  - Notify provider if running any fever,chills or symptoms worsen or fail to improve

## 2020-08-30 NOTE — Progress Notes (Signed)
Provider: Marlowe Sax FNP-C  Sokhna Christoph, [name], NP  Patient Care Team: Cherye Gaertner, [name], NP as PCP - General (Family Medicine)  Extended Emergency Contact Information Primary Emergency Contact: Oseias, Horsey Mobile Phone: [phone] Relation: Daughter  Code Status:  DNR Goals of care: Advanced Directive information Advanced Directives 08/30/2020  Does Patient Have a Medical Advance Directive? No  Type of Advance Directive -  Does patient want to make changes to medical advance directive? -  Copy of [place] in Chart? -  Would patient like information on creating a medical advance directive? No - Patient declined     Chief Complaint  Patient presents with  . Acute Visit    Sore throat for 1 week.    HPI:  Pt is a 75 y.o. Burton seen today for an acute visit for evaluation of sore throat x 1 week. No drainage.Has had very little cough.unable to eat due to pain in the throat.No fever, chills,loss of sense of taste or smell.Has had no contact with sick person with COVID-19.   Past Medical History:  Diagnosis Date  . Bell palsy   . CKD (chronic kidney disease), stage III ([place]) 12/16/2018  . Coronary artery disease involving native coronary artery of native heart with unstable angina pectoris ([place]) 12/16/2018  . GERD (gastroesophageal reflux disease)   . Hypertension   . Hypertension with heart disease 12/16/2018  . Myocardial infarction ([place])   . Pure hypercholesterolemia 12/16/2018  . Stroke ([place])   . Type 2 diabetes, controlled, with peripheral neuropathy ([place]) 12/16/2018   Past Surgical History:  Procedure Laterality Date  . CARDIAC SURGERY    . COLONOSCOPY WITH PROPOFOL N/A 01/02/2018   Procedure: COLONOSCOPY WITH PROPOFOL;  Surgeon: Carol Ada, MD;  Location: [facility];  Service: Endoscopy;  Laterality: N/A;  . CORONARY STENT INTERVENTION N/A 01/26/2019   Procedure: CORONARY STENT INTERVENTION;  Surgeon: Adrian Prows, MD;  Location: [facility];  Service: Cardiovascular;  Laterality: N/A;  . LEFT HEART CATH AND CORS/GRAFTS ANGIOGRAPHY N/A 01/05/2019   Procedure: LEFT HEART CATH AND CORS/GRAFTS ANGIOGRAPHY;  Surgeon: Adrian Prows, MD;  Location: [facility];  Service: Cardiovascular;  Laterality: N/A;  . POLYPECTOMY  01/02/2018   Procedure: POLYPECTOMY;  Surgeon: Carol Ada, MD;  Location: [facility];  Service: Endoscopy;;  . stents      No Known Allergies  Outpatient Encounter Medications as of 08/30/2020  Medication Sig  . amLODipine (NORVASC) 5 MG tablet TAKE 1 TABLET(5 MG) BY MOUTH DAILY  . BD PEN NEEDLE NANO 2ND GEN 32G X 4 MM MISC USE 1 PEN THREE TIMES DAILY AS DIRECTED  . bisacodyl (DULCOLAX) 10 MG suppository Place 10 mg rectally daily as needed for moderate constipation.  . calamine lotion Apply 1 application topically as needed for itching.  . clopidogrel (PLAVIX) 75 MG tablet Take 1 tablet (75 mg total) by mouth daily.  . Dulaglutide (TRULICITY) 7.56 EP/3.2RJ SOPN Administer 0.75mg  under the skin One time a Week.  . ezetimibe (ZETIA) 10 MG tablet TAKE 1 TABLET(10 MG) BY MOUTH DAILY  . glucose blood (ACCU-CHEK AVIVA PLUS) test strip Use to test blood sugar 3 times daily. Dx: E11.42  . HYDROcodone-acetaminophen (NORCO) 10-325 MG tablet Take 1 tablet by mouth 3 (three) times daily as needed for moderate pain.  Marland Kitchen insulin detemir (LEVEMIR) 100 UNIT/ML FlexPen Inject 20 Units into the skin at bedtime.  . insulin lispro (HUMALOG KWIKPEN) 100 UNIT/ML KwikPen Inject 8-10 units into the skin  per sliding scale three times daily with meals.  . isosorbide mononitrate (IMDUR) 60 MG 24 hr tablet Take 1 tablet (60 mg total) by mouth daily.  Marland Kitchen losartan (COZAAR) 25 MG tablet TAKE 1/2 TABLET(12.5 MG) BY MOUTH DAILY  . metoprolol tartrate (LOPRESSOR) 25 MG tablet TAKE 1/2 TABLET(12.5 MG) BY MOUTH TWICE DAILY  . nitroGLYCERIN (NITROSTAT) 0.4 MG SL tablet Dissolve one tablet under tongue as needed for chest pain.  Marland Kitchen  REPATHA 140 MG/ML SOSY Inject 140 mg into the skin every 14 (fourteen) days.  Marland Kitchen senna (SENOKOT) 8.6 MG TABS tablet Take 1 tablet (8.6 mg total) by mouth daily.   No facility-administered encounter medications on file as of 08/30/2020.    Review of Systems  Constitutional: Negative for appetite change, chills, fatigue and fever.  HENT: Positive for sore throat. Negative for congestion, rhinorrhea, sinus pressure, sinus pain and sneezing.   Eyes: Negative for pain, discharge, redness and itching.  Respiratory: Negative for cough, chest tightness, shortness of breath and wheezing.   Cardiovascular: Negative for chest pain, palpitations and leg swelling.  Gastrointestinal: Negative for abdominal distention, abdominal pain, constipation, diarrhea, nausea and vomiting.  Skin: Negative for color change, pallor and rash.  Neurological: Negative for dizziness, speech difficulty, weakness, light-headedness and headaches.    Immunization History  Administered Date(s) Administered  . PFIZER(Purple Top)SARS-COV-2 Vaccination 08/13/2019, 09/06/2019  . Pneumococcal Conjugate-13 01/14/2020   Pertinent  Health Maintenance Due  Topic Date Due  . FOOT EXAM  Never done  . OPHTHALMOLOGY EXAM  Never done  . INFLUENZA VACCINE  Never done  . HEMOGLOBIN A1C  11/06/2020  . PNA vac Low Risk Adult (2 of 2 - PPSV23) 01/13/2021  . COLONOSCOPY (Pts 45-42yrs Insurance coverage will need to be confirmed)  01/03/2028   Fall Risk  08/30/2020 06/26/2020 06/12/2020 05/11/2020 04/07/2020  Falls in the past year? 0 0 0 0 0  Number falls in past yr: 0 0 0 0 0  Injury with Fall? 0 0 0 0 0   Functional Status Survey:    Vitals:   08/30/20 1303  Height: 5\' 6"  (1.676 m)   Body mass index is 41.84 kg/m. Physical Exam Vitals reviewed.  Constitutional:      General: He is not in acute distress.    Appearance: He is obese. He is not ill-appearing.  HENT:     Head: Normocephalic.     Right Ear: Tympanic membrane, ear  canal and external ear normal. There is no impacted cerumen.     Left Ear: Tympanic membrane, ear canal and external ear normal. There is no impacted cerumen.     Nose: Nose normal. No congestion or rhinorrhea.     Mouth/Throat:     Mouth: Mucous membranes are moist.     Pharynx: Posterior oropharyngeal erythema present. No pharyngeal swelling, oropharyngeal exudate or uvula swelling.  Eyes:     General: No scleral icterus.       Right eye: No discharge.        Left eye: No discharge.     Conjunctiva/sclera: Conjunctivae normal.     Pupils: Pupils are equal, round, and reactive to light.  Neck:     Vascular: No carotid bruit.     Comments: Tonsillar tender to palpate   Cardiovascular:     Rate and Rhythm: Normal rate and regular rhythm.     Pulses: Normal pulses.     Heart sounds: Normal heart sounds. No murmur heard. No friction rub. No gallop.  Pulmonary:     Effort: Pulmonary effort is normal. No respiratory distress.     Breath sounds: Normal breath sounds. No wheezing, rhonchi or rales.  Chest:     Chest wall: No tenderness.  Abdominal:     General: Bowel sounds are normal. There is no distension.     Palpations: Abdomen is soft. There is no mass.     Tenderness: There is no abdominal tenderness. There is no right CVA tenderness, left CVA tenderness, guarding or rebound.  Musculoskeletal:        General: No swelling or tenderness. Normal range of motion.     Cervical back: Normal range of motion. No rigidity or tenderness.     Right lower leg: No edema.     Left lower leg: No edema.  Skin:    General: Skin is warm and dry.     Coloration: Skin is not pale.     Findings: No bruising, erythema or rash.  Neurological:     Mental Status: He is alert and oriented to person, place, and time.     Cranial Nerves: No cranial nerve deficit.     Motor: No weakness.     Gait: Gait normal.     Labs reviewed: Recent Labs    06/01/20 1424 06/12/20 1429 06/26/20 1627  NA 137  138 137  K 4.4 4.6 4.4  CL 106 106 105  CO2 24 23 19*  GLUCOSE 103* 147* 246*  BUN 25 33* 32*  CREATININE 1.44* 1.93* 1.94*  CALCIUM 9.4 8.9 9.4   Recent Labs    09/16/19 0835 01/10/20 0846 06/01/20 1424 06/12/20 1429 06/26/20 1627  AST 17   < > 57* 42* 14  ALT 22   < > 107* 42 20  ALKPHOS 87  --   --   --   --   BILITOT 1.0   < > 0.5 0.4 0.6  PROT 7.6   < > 6.7 7.2 7.1  ALBUMIN 3.8  --   --   --   --    < > = values in this interval not displayed.   Recent Labs    04/07/20 1558 05/08/20 0828 06/26/20 1627  WBC 7.7 8.9 8.1  NEUTROABS 4,666 5,037 5,654  HGB 13.9 14.9 14.4  HCT 41.5 44.5 42.1  MCV 90.8 90.8 89.2  PLT 149 156 275   Lab Results  Component Value Date   TSH 3.02 05/08/2020   Lab Results  Component Value Date   HGBA1C 6.6 (H) 05/08/2020   Lab Results  Component Value Date   CHOL David 05/08/2020   HDL 60 05/08/2020   LDLCALC 16 05/08/2020   TRIG 70 05/08/2020   CHOLHDL 1.5 05/08/2020    Significant Diagnostic Results in last 30 days:  No results found.  Assessment/Plan  Sore throat Afebrile. Posterior oropharynx erythema with tonsillar gland tenderness to palpation.  - Advised to Gurgle throat with warm water and salt twice daily and as needed for 3 days. - please increase your water intake or soup  - POC Rapid Strep A results negative. - SARS-COV-2 RNA,(COVID-19) QUAL NAAT - amoxicillin-clavulanate (AUGMENTIN) 875-125 MG tablet; Take 1 tablet by mouth 2 (two) times daily.  Dispense: 20 tablet; Refill: 0 - saccharomyces boulardii (FLORASTOR) 250 MG capsule; Take 1 capsule (250 mg total) by mouth 2 (two) times daily.  Dispense: 20 capsule; Refill: 0 - Notify provider if running any fever,chills or symptoms worsen or fail to improve  Family/ staff Communication:  Reviewed plan of care with patient verbalized understanding   Labs/tests ordered:  - POC Rapid Strep A results negative. - SARS-COV-2 RNA,(COVID-19) QUAL NAAT  Next Appointment:  As needed if symptoms worsen or fail to improve   Sandrea Hughs, NP

## 2020-08-31 ENCOUNTER — Other Ambulatory Visit: Payer: Self-pay | Admitting: Family

## 2020-08-31 DIAGNOSIS — N183 Chronic kidney disease, stage 3 unspecified: Secondary | ICD-10-CM

## 2020-08-31 DIAGNOSIS — E1142 Type 2 diabetes mellitus with diabetic polyneuropathy: Secondary | ICD-10-CM

## 2020-08-31 LAB — SARS-COV-2 RNA,(COVID-19) QUALITATIVE NAAT: SARS CoV2 RNA: NOT DETECTED

## 2020-09-01 ENCOUNTER — Other Ambulatory Visit: Payer: Medicare Other

## 2020-09-04 ENCOUNTER — Other Ambulatory Visit: Payer: Medicare HMO

## 2020-09-04 ENCOUNTER — Other Ambulatory Visit: Payer: Medicare Other

## 2020-09-06 ENCOUNTER — Other Ambulatory Visit: Payer: Self-pay | Admitting: Family

## 2020-09-06 ENCOUNTER — Other Ambulatory Visit: Payer: Self-pay

## 2020-09-06 ENCOUNTER — Other Ambulatory Visit: Payer: Medicare Other

## 2020-09-06 DIAGNOSIS — K219 Gastro-esophageal reflux disease without esophagitis: Secondary | ICD-10-CM

## 2020-09-06 DIAGNOSIS — I129 Hypertensive chronic kidney disease with stage 1 through stage 4 chronic kidney disease, or unspecified chronic kidney disease: Secondary | ICD-10-CM | POA: Diagnosis not present

## 2020-09-06 DIAGNOSIS — E1142 Type 2 diabetes mellitus with diabetic polyneuropathy: Secondary | ICD-10-CM

## 2020-09-06 DIAGNOSIS — E7800 Pure hypercholesterolemia, unspecified: Secondary | ICD-10-CM | POA: Diagnosis not present

## 2020-09-06 DIAGNOSIS — N1832 Chronic kidney disease, stage 3b: Secondary | ICD-10-CM

## 2020-09-06 DIAGNOSIS — N183 Chronic kidney disease, stage 3 unspecified: Secondary | ICD-10-CM

## 2020-09-07 ENCOUNTER — Telehealth: Payer: Self-pay | Admitting: *Deleted

## 2020-09-07 LAB — COMPLETE METABOLIC PANEL WITH GFR
AG Ratio: 1.3 (calc) (ref 1.0–2.5)
ALT: 16 U/L (ref 9–46)
AST: 18 U/L (ref 10–35)
Albumin: 4.5 g/dL (ref 3.6–5.1)
Alkaline phosphatase (APISO): 61 U/L (ref 35–144)
BUN/Creatinine Ratio: 16 (calc) (ref 6–22)
BUN: 38 mg/dL — ABNORMAL HIGH (ref 7–25)
CO2: 12 mmol/L — ABNORMAL LOW (ref 20–32)
Calcium: 9.7 mg/dL (ref 8.6–10.3)
Chloride: 109 mmol/L (ref 98–110)
Creat: 2.38 mg/dL — ABNORMAL HIGH (ref 0.70–1.18)
GFR, Est African American: 30 mL/min/{1.73_m2} — ABNORMAL LOW (ref >=60)
GFR, Est Non African American: 26 mL/min/{1.73_m2} — ABNORMAL LOW (ref >=60)
Globulin: 3.5 g/dL (calc) (ref 1.9–3.7)
Glucose, Bld: 142 mg/dL — ABNORMAL HIGH (ref 65–99)
Potassium: 4.2 mmol/L (ref 3.5–5.3)
Sodium: 136 mmol/L (ref 135–146)
Total Bilirubin: 0.3 mg/dL (ref 0.2–1.2)
Total Protein: 8 g/dL (ref 6.1–8.1)

## 2020-09-07 LAB — CBC WITH DIFFERENTIAL/PLATELET
Absolute Monocytes: 660 cells/uL (ref 200–950)
Basophils Absolute: 50 cells/uL (ref 0–200)
Basophils Relative: 0.5 %
Eosinophils Absolute: 160 cells/uL (ref 15–500)
Eosinophils Relative: 1.6 %
HCT: 47.1 % (ref 38.5–50.0)
Hemoglobin: 15.9 g/dL (ref 13.2–17.1)
Lymphs Abs: 2660 cells/uL (ref 850–3900)
MCH: 31 pg (ref 27.0–33.0)
MCHC: 33.8 g/dL (ref 32.0–36.0)
MCV: 91.8 fL (ref 80.0–100.0)
MPV: 10.4 fL (ref 7.5–12.5)
Monocytes Relative: 6.6 %
Neutro Abs: 6470 cells/uL (ref 1500–7800)
Neutrophils Relative %: 64.7 %
Platelets: 214 10*3/uL (ref 140–400)
RBC: 5.13 10*6/uL (ref 4.20–5.80)
RDW: 14.7 % (ref 11.0–15.0)
Total Lymphocyte: 26.6 %
WBC: 10 10*3/uL (ref 3.8–10.8)

## 2020-09-07 LAB — LIPID PANEL
Cholesterol: 166 mg/dL (ref <200)
HDL: 33 mg/dL — ABNORMAL LOW (ref >=40)
LDL Cholesterol (Calc): 107 mg/dL (calc) — ABNORMAL HIGH
Non-HDL Cholesterol (Calc): 133 mg/dL (calc) — ABNORMAL HIGH (ref <130)
Total CHOL/HDL Ratio: 5 (calc) — ABNORMAL HIGH (ref <5.0)
Triglycerides: 145 mg/dL (ref <150)

## 2020-09-07 LAB — HEMOGLOBIN A1C
Hgb A1c MFr Bld: 8.1 % of total Hgb — ABNORMAL HIGH (ref <5.7)
Mean Plasma Glucose: 186 mg/dL
eAG (mmol/L): 10.3 mmol/L

## 2020-09-07 LAB — TSH: TSH: 1.63 mIU/L (ref 0.40–4.50)

## 2020-09-07 MED ORDER — HYDROCODONE-ACETAMINOPHEN 10-325 MG PO TABS: 1.0000 | ORAL_TABLET | Freq: Three times a day (TID) | ORAL | 0 refills | Status: DC | PRN

## 2020-09-07 NOTE — Telephone Encounter (Signed)
Daughter notified and agreed.  

## 2020-09-07 NOTE — Telephone Encounter (Signed)
Okay to refill Hydrocodone.

## 2020-09-07 NOTE — Telephone Encounter (Signed)
Con [place], daughter called requesting refill on patient's pain medication Hydrocodone.   Medication is not in patient's current medication list and states it Expired on 09/03/2020. Patient gets it refilled each month per daughter.   Hydrocodone 10/325mg  One three times daily as needed.   Epic last Refill was 08/04/2020 Contract on File.   Is this ok to add back to medication list and refill. Pended Rx for approval.  Please Advise.

## 2020-09-08 ENCOUNTER — Ambulatory Visit (INDEPENDENT_AMBULATORY_CARE_PROVIDER_SITE_OTHER): Payer: Medicare Other | Admitting: Family

## 2020-09-08 ENCOUNTER — Encounter: Payer: Self-pay | Admitting: Family

## 2020-09-08 ENCOUNTER — Other Ambulatory Visit: Payer: Self-pay

## 2020-09-08 VITALS — BP 130/80 | HR 100 | Temp 96.6°F | Resp 18 | Ht 66.0 in | Wt 244.6 lb

## 2020-09-08 DIAGNOSIS — I25110 Atherosclerotic heart disease of native coronary artery with unstable angina pectoris: Secondary | ICD-10-CM

## 2020-09-08 DIAGNOSIS — E7800 Pure hypercholesterolemia, unspecified: Secondary | ICD-10-CM

## 2020-09-08 DIAGNOSIS — N1832 Chronic kidney disease, stage 3b: Secondary | ICD-10-CM | POA: Diagnosis not present

## 2020-09-08 DIAGNOSIS — I129 Hypertensive chronic kidney disease with stage 1 through stage 4 chronic kidney disease, or unspecified chronic kidney disease: Secondary | ICD-10-CM | POA: Diagnosis not present

## 2020-09-08 DIAGNOSIS — Z23 Encounter for immunization: Secondary | ICD-10-CM | POA: Diagnosis not present

## 2020-09-08 DIAGNOSIS — H6123 Impacted cerumen, bilateral: Secondary | ICD-10-CM | POA: Diagnosis not present

## 2020-09-08 DIAGNOSIS — E782 Mixed hyperlipidemia: Secondary | ICD-10-CM

## 2020-09-08 DIAGNOSIS — E1142 Type 2 diabetes mellitus with diabetic polyneuropathy: Secondary | ICD-10-CM | POA: Diagnosis not present

## 2020-09-08 DIAGNOSIS — N183 Chronic kidney disease, stage 3 unspecified: Secondary | ICD-10-CM | POA: Diagnosis not present

## 2020-09-08 DIAGNOSIS — M8949 Other hypertrophic osteoarthropathy, multiple sites: Secondary | ICD-10-CM | POA: Diagnosis not present

## 2020-09-08 DIAGNOSIS — M159 Polyosteoarthritis, unspecified: Secondary | ICD-10-CM

## 2020-09-08 MED ORDER — DEBROX 6.5 % OT SOLN
5.0000 [drp] | Freq: Two times a day (BID) | OTIC | 0 refills | Status: DC
Start: 2020-09-08 — End: 2020-09-15

## 2020-09-08 MED ORDER — TETANUS-DIPHTH-ACELL PERTUSSIS 5-2.5-18.5 LF-MCG/0.5 IM SUSP: 0.5000 mL | Freq: Once | INTRAMUSCULAR | 0 refills | Status: AC

## 2020-09-08 NOTE — Progress Notes (Deleted)
David Burton Date of Birth: [birth year] MRN: [id number] Primary Care Provider:[name], [name], NP Former Cardiology Providers: Dr. Adrian Prows, Jeri Lager, APRN, FNP-C Primary Cardiologist:Sunit Terri Skains, DO, [facility] (established care 01/11/2020)  ***  No chief complaint on file.   HPI  David Burton is a 75 y.o.  male who presents to the office with a chief complaint of " ***." Patient's past medical history and cardiovascular risk factors include: NSTEMI in Dec 2018, s/p CABG x 4 on 05/08/2017 (LIMA to LAD, SVG to PDA, SVG to Diagonal, SVG to Ramus. Surgery done at [facility]), diabetes mellitus type 2 with peripheral neuropathy, hypertension, hypercholesterolemia, CVA (09/2019), advanced age.  Previously under the care of Miquel Dunn and is being followed in the clinic for his underlying CAD status post surgical revascularization and lipid management.  Since last office visit patient is doing well from a cardiovascular standpoint.  At last office visit I encouraged him to improve his modifiable cardiovascular risk factors that he had uncontrolled diabetes mellitus with hemoglobin A1c of 12.9.  His lipid profile was not optimized as his fasting LDL 124 mg/dL and triglycerides 274.  Since last visit patient has been more cognizant of his glycemic control and his most recent hemoglobin A1c has come down to 8.8.  He was also started on Repatha through our office and his total cholesterol has improved to 128, LDL is now 79, and triglycerides 84.  Given the reasons for review supposed to have been mobile cardiac ambulatory telemetry which he forgot to complete.  No records available for [place] to review at today's encounter.  Since last visit no use of sublingual nitroglycerin tablets.  Patient's blood pressures are not well controlled.  Patient states that he does not have a means to check his blood pressures at home.  Recommended enrolling into remote patient monitoring for ambulatory blood  pressure.  Patient is agreeable.  FUNCTIONAL STATUS: He walks atleast 0.5 miles per day.    ALLERGIES: No Known Allergies  MEDICATION LIST PRIOR TO VISIT: Current Outpatient Medications on File Prior to Visit  Medication Sig Dispense Refill  . amLODipine (NORVASC) 5 MG tablet TAKE 1 TABLET(5 MG) BY MOUTH DAILY 90 tablet 1  . amoxicillin-clavulanate (AUGMENTIN) 875-125 MG tablet Take 1 tablet by mouth 2 (two) times daily. 20 tablet 0  . BD PEN NEEDLE NANO 2ND GEN 32G X 4 MM MISC USE 1 PEN THREE TIMES DAILY AS DIRECTED 100 each 3  . bisacodyl (DULCOLAX) 10 MG suppository Place 10 mg rectally daily as needed for moderate constipation.    . calamine lotion Apply 1 application topically as needed for itching. 120 mL 0  . carbamide peroxide (DEBROX) 6.5 % OTIC solution Place 5 drops into both ears 2 (two) times daily. 15 mL 0  . clopidogrel (PLAVIX) 75 MG tablet Take 1 tablet (75 mg total) by mouth daily. 90 tablet 1  . Dulaglutide (TRULICITY) 9.62 IW/9.7LG SOPN Administer 0.75mg  under the skin One time a Week. 9 mL 1  . ezetimibe (ZETIA) 10 MG tablet TAKE 1 TABLET(10 MG) BY MOUTH DAILY 90 tablet 1  . glucose blood (ACCU-CHEK AVIVA PLUS) test strip Use to test blood sugar 3 times daily. Dx: E11.42 300 each 1  . HYDROcodone-acetaminophen (NORCO) 10-325 MG tablet Take 1 tablet by mouth 3 (three) times daily as needed. 90 tablet 0  . insulin detemir (LEVEMIR) 100 UNIT/ML FlexPen Inject 20 Units into the skin at bedtime. 30 mL 1  . insulin lispro (HUMALOG  KWIKPEN) 100 UNIT/ML KwikPen Inject 8-10 units into the skin per sliding scale three times daily with meals. 45 mL 3  . isosorbide mononitrate (IMDUR) 60 MG 24 hr tablet Take 1 tablet (60 mg total) by mouth daily. 90 tablet 1  . losartan (COZAAR) 25 MG tablet TAKE 1/2 TABLET(12.5 MG) BY MOUTH DAILY 45 tablet 2  . metoprolol tartrate (LOPRESSOR) 25 MG tablet TAKE 1/2 TABLET(12.5 MG) BY MOUTH TWICE DAILY 90 tablet 1  . nitroGLYCERIN (NITROSTAT) 0.4 MG  SL tablet Dissolve one tablet under tongue as needed for chest pain. 30 tablet 3  . REPATHA 140 MG/ML SOSY Inject 140 mg into the skin every 14 (fourteen) days. 2.1 mL 3  . saccharomyces boulardii (FLORASTOR) 250 MG capsule Take 1 capsule (250 mg total) by mouth 2 (two) times daily. 20 capsule 0  . senna (SENOKOT) 8.6 MG TABS tablet Take 1 tablet (8.6 mg total) by mouth daily. 120 tablet 5  . Tdap (BOOSTRIX) 5-2.5-18.5 LF-MCG/0.5 injection Inject 0.5 mLs into the muscle once for 1 dose. 0.5 mL 0   No current facility-administered medications on file prior to visit.    PAST MEDICAL HISTORY: Past Medical History:  Diagnosis Date  . Bell palsy   . CKD (chronic kidney disease), stage III ([place]) 12/16/2018  . Coronary artery disease involving native coronary artery of native heart with unstable angina pectoris ([place]) 12/16/2018  . GERD (gastroesophageal reflux disease)   . Hypertension   . Hypertension with heart disease 12/16/2018  . Myocardial infarction ([place])   . Pure hypercholesterolemia 12/16/2018  . Stroke ([place])   . Type 2 diabetes, controlled, with peripheral neuropathy ([place]) 12/16/2018    PAST SURGICAL HISTORY: Past Surgical History:  Procedure Laterality Date  . CARDIAC SURGERY    . COLONOSCOPY WITH PROPOFOL N/A 01/02/2018   Procedure: COLONOSCOPY WITH PROPOFOL;  Surgeon: Carol Ada, MD;  Location: [facility];  Service: Endoscopy;  Laterality: N/A;  . CORONARY STENT INTERVENTION N/A 01/26/2019   Procedure: CORONARY STENT INTERVENTION;  Surgeon: Adrian Prows, MD;  Location: [place] [facility];  Service: Cardiovascular;  Laterality: N/A;  . LEFT HEART CATH AND CORS/GRAFTS ANGIOGRAPHY N/A 01/05/2019   Procedure: LEFT HEART CATH AND CORS/GRAFTS ANGIOGRAPHY;  Surgeon: Adrian Prows, MD;  Location: [place] [facility];  Service: Cardiovascular;  Laterality: N/A;  . POLYPECTOMY  01/02/2018   Procedure: POLYPECTOMY;  Surgeon: Carol Ada, MD;  Location: [facility];  Service: Endoscopy;;  .  stents      FAMILY HISTORY: The patient's family history includes Cancer in his mother; Heart attack in his father.   SOCIAL HISTORY:  The patient  reports that he quit smoking about 17 years ago. His smoking use included cigarettes. He has a 5.00 pack-year smoking history. He has never used smokeless tobacco. He reports previous alcohol use. He reports that he does not use drugs.  Review of Systems  Constitutional: Negative for chills and fever.  HENT: Negative for hoarse voice and nosebleeds.   Eyes: Negative for discharge, double vision and pain.  Cardiovascular: Positive for dyspnea on exertion (stable). Negative for chest pain, claudication, leg swelling, near-syncope, orthopnea, palpitations, paroxysmal nocturnal dyspnea and syncope.  Respiratory: Negative for hemoptysis and shortness of breath.   Musculoskeletal: Negative for muscle cramps and myalgias.  Gastrointestinal: Negative for abdominal pain, constipation, diarrhea, hematemesis, hematochezia, melena, nausea and vomiting.  Neurological: Positive for focal weakness (left leg weakness (residual from recent CVA) improving. ). Negative for dizziness and light-headedness.   PHYSICAL EXAM: Vitals  with BMI 09/08/2020 08/30/2020 06/26/2020  Height 5\' 6"  5\' 6"  5\' 6"   Weight 244 lbs 10 oz - -  BMI 52.8 - -  Systolic 413 244 010  Diastolic 80 88 88  Pulse 272 73 93   CONSTITUTIONAL: Well-developed and well-nourished. No acute distress.  SKIN: Skin is warm and dry. No rash noted. No cyanosis. No pallor. No jaundice HEAD: Normocephalic and atraumatic.  EYES: No scleral icterus MOUTH/THROAT: Moist oral membranes.  NECK: No JVD present. No thyromegaly noted. No carotid bruits  LYMPHATIC: No visible cervical adenopathy.  CHEST Normal respiratory effort. No intercostal retractions  LUNGS: Clear to auscultation bilaterally.  No stridor. No wheezes. No rales.  CARDIOVASCULAR: Regular rate and rhythm, positive S1-S2, no murmurs rubs or  gallops appreciated. ABDOMINAL: No apparent ascites.  EXTREMITIES: No peripheral edema  HEMATOLOGIC: No significant bruising NEUROLOGIC: Oriented to person, place, and time. Nonfocal. Normal muscle tone.  PSYCHIATRIC: Normal mood and affect. Normal behavior. Cooperative  RADIOLOGY: MR Brain w/o contrast:  IMPRESSION: 1. Mildly motion degraded exam. 2. Confirmed 17 mm acute/early subacute infarct within the right pons. 3. Background mild chronic small vessel ischemic disease within the cerebral white matter and pons. Tiny chronic lacunar infarct within the left cerebellum. 4. Mild generalized parenchymal atrophy. 5. Mild paranasal sinus mucosal thickening. Trace left mastoid effusion.  CARDIAC DATABASE: Coronary artery bypass grafting: s/p CABG x 4 on 05/08/2017 (LIMA to LAD, SVG to PDA, SVG to Diagonal, SVG to Ramus.  EKG: 01/11/2020: Normal sinus rhythm, 82 bpm, left axis deviation, left anterior fascicular block, incomplete right bundle branch block, old inferior infarct, without underlying ischemia or injury pattern.    Echocardiogram: 09/17/2019: LVEF 55 to 60%, no regional wall motion arise, grade 2 diastolic impairment, elevated LVEDP, right ventricular size mildly dilated, biatrial dilatation, trivial MR, aortic valve sclerosis without stenosis.  Stress Testing:  Lexiscan myoview stress test 10/10/2017: 1. Lexiscan stress test was performed. Exercise capacity was not assessed. Stress symptoms included dizziness. Normal blood pressure. The resting electrocardiogram demonstrated normal sinus rhythm, normal resting conduction, no resting arrhythmias and normal rest repolarization. Stress EKG is non diagnostic for ischemia as it is a pharmacologic stress. 2. The overall quality of the study is good. Left ventricular cavity is noted to be normal on the rest and stress studies. Gated SPECT images reveal normal myocardial thickening and wall motion, except mild inferior hypokinesis. The  left ventricular ejection fraction was calculated or visually estimated to be 53%. SPECT stress images demonstrate medium size, moderate intensity perfusion defect in basal to apical inferior, inferolateral myocardium. SPECT rest images demonstrate medium size, mild intensity perfusion defect in basal to apical inferior myocardium. This suggests inferior infarct with periinfarct mild ischemia, as well as inferolateral ischemia. (LCx/PDA territory). 3. Intermediate risk study.  Heart Catheterization: Coronary angiogram 01/05/2019: Normal LVEDP, severe native vessel disease.  High-grade proximal RCA 90 to 95% stenosis followed by 70 to 80% stenosis in the in-stent restenotic proximal segment.  Large RCA.  SVG to RCA could not be visualized in spite of multiple catheter attempts, and presumed occluded. Left main is large and mildly diseased but patent.  LAD is occluded in the midsegment after the origin of D1, which has ostial 90% stenosis.  SVG to D1 is patent.  LIMA to LAD is patent. Ramus intermediate: Large vessel, mild to moderate calcific 80 to 85% stenosis in the proximal segment.  SVG to ramus intermediate is occluded. Circumflex: High-grade 99%/subtotal occlusion in the ostium.  Moderate to large  sized vessel.  Small marginals.  14 - day Mobile Cardiac Ambulatory Telemetry Dominant rhythm normal sinus.  Heart rate 59-156 bpm.  Avg HR 85 bpm. No atrial fibrillation, ventricular tachycardia, high grade AV block, pauses (3 seconds or longer). Total ventricular and supraventricular ectopic burden <1%. Two episode of supraventricular tachycardia. Longest and fastest episode was 17 beats at maximum HR of 156 bpm.  Patient triggered events: 0.  Coronary intervention 01/26/2019: Successful PTCA and stenting of the proximal mid and mid to distal RCA with implantation of 2 overlapping 3.0 x 38 mm Promus Synergy DES, stenosis reduced from 90% to 0% with TIMI-3 to TIMI-3 flow.    Recommendation: With  medical high-grade ramus intermediate and circumflex stenosis, he has excellent brisk flow there and ischemia was in the inferior wall, however if he still persist to have recurrence of angina pectoris we will schedule him for complex intervention to the left coronary system.  In view of his underlying comorbidity, would recommend medical therapy  Carotid duplex: 09/17/2019: Right Carotid: Velocities in the right ICA are consistent with a 1-39% stenosis.  Left Carotid: Velocities in the left ICA are consistent with a 1-39% stenosis.  Vertebrals: Left vertebral artery demonstrates antegrade flow. Right vertebral artery was not visualized  LABORATORY DATA: CBC Latest Ref Rng & Units 09/06/2020 06/26/2020 05/08/2020  WBC 3.8 - 10.8 Thousand/uL 10.0 8.1 8.9  Hemoglobin 13.2 - 17.1 g/dL 15.9 14.4 14.9  Hematocrit 38.5 - 50.0 % 47.1 42.1 44.5  Platelets 140 - 400 Thousand/uL 214 275 156    CMP Latest Ref Rng & Units 09/06/2020 06/26/2020 06/12/2020  Glucose 65 - 99 mg/dL 142(H) 246(H) 147(H)  BUN 7 - 25 mg/dL 38(H) 32(H) 33(H)  Creatinine 0.70 - 1.18 mg/dL 2.38(H) 1.94(H) 1.93(H)  Sodium 135 - 146 mmol/L 136 137 138  Potassium 3.5 - 5.3 mmol/L 4.2 4.4 4.6  Chloride 98 - 110 mmol/L 109 105 106  CO2 20 - 32 mmol/L 12(L) 19(L) 23  Calcium 8.6 - 10.3 mg/dL 9.7 9.4 8.9  Total Protein 6.1 - 8.1 g/dL 8.0 7.1 7.2  Total Bilirubin 0.2 - 1.2 mg/dL 0.3 0.6 0.4  Alkaline Phos 38 - 126 U/L - - -  AST 10 - 35 U/L 18 14 42(H)  ALT 9 - 46 U/L 16 20 42   Last lipids Lab Results  Component Value Date   CHOL 166 09/06/2020   HDL 33 (L) 09/06/2020   LDLCALC 107 (H) 09/06/2020   TRIG 145 09/06/2020   CHOLHDL 5.0 (H) 09/06/2020    Lab Results  Component Value Date   HGBA1C 8.1 (H) 09/06/2020   HGBA1C 6.6 (H) 05/08/2020   HGBA1C 8.8 (H) 03/02/2020   No components found for: NTPROBNP Lab Results  Component Value Date   TSH 1.63 09/06/2020   TSH 3.02 05/08/2020   TSH 4.56 (H) 03/02/2020    Cardiac  Panel (last 3 results) No results for input(s): CKTOTAL, CKMB, TROPONINIHS, RELINDX in the last 72 hours.  IMPRESSION:  No diagnosis found.   RECOMMENDATIONS: Aydeen Blume is a 75 y.o. male whose past medical history and cardiovascular risk factors include: NSTEMI in Dec 2018, s/p CABG x 4 on 05/08/2017 (LIMA to LAD, SVG to PDA, SVG to Diagonal, SVG to Ramus. Surgery done at [facility]), diabetes mellitus type 2 with peripheral neuropathy, hypertension, hypercholesterolemia, CVA (09/2019), advanced age.  Established coronary artery disease with prior history of four-vessel CABG without angina pectoris: Stable  No use of sublingual nitroglycerin  tablets since last visit.  Encouraged increasing physical activity as tolerated with a goal of 30 minutes a day 5 days a week   Patient is congratulated on the efforts that he is made with improvement in his A1c from 12.9-8.8, LDL was 124 and now 63, triglycerides were 274 and now 84.    Medications reconciled.  Since last visit he has been initiated on Repatha which she is tolerating well without any side effects or intolerances.    Educated on the importance of secondary prevention.    History of CABG: See above  Recent stroke with residual deficits:  Patient is encouraged to follow-up with neurology as recommended.  Educated on the importance of secondary prevention by improving his modifiable cardiovascular risk factors as discussed above.  Patient noted to have atrial dilatation on most recent echocardiogram and with a history of recent stroke would recommend 14-day MCT to evaluate for underlying atrial fibrillation.  Pure hypercholesterolemia:  Continue Crestor and Zetia.  Continue Repatha  Most recent lipid profile from March 02, 2020 independently reviewed with the patient at today's visit  Mild increase in AST and ALT, patient is asked to follow-up with PCP.  Benign essential hypertension with chronic  kidney disease stage 3 . Office blood pressure is not at goal.  . Medication reconciled.  . Patient will be enrolled into remote patient monitoring for ambulatory blood pressure management. . We will follow up with medication titration as clinically warranted. . Low salt diet recommended. A diet that is rich in fruits, vegetables, legumes, and low-fat dairy products and low in snacks, sweets, and meats (such as the Dietary Approaches to Stop Hypertension [DASH] diet).   Former smoker: Educated on the importance of continued smoking cessation.  Obesity, due to excess calories: There is no height or weight on file to calculate BMI. . I reviewed with the patient the importance of diet, regular physical activity/exercise, weight loss.   . Patient is educated on increasing physical activity gradually as tolerated.  With the goal of moderate intensity exercise for 30 minutes a day 5 days a week.  FINAL MEDICATION LIST END OF ENCOUNTER: No orders of the defined types were placed in this encounter.    Current Outpatient Medications:  .  amLODipine (NORVASC) 5 MG tablet, TAKE 1 TABLET(5 MG) BY MOUTH DAILY, Disp: 90 tablet, Rfl: 1 .  amoxicillin-clavulanate (AUGMENTIN) 875-125 MG tablet, Take 1 tablet by mouth 2 (two) times daily., Disp: 20 tablet, Rfl: 0 .  BD PEN NEEDLE NANO 2ND GEN 32G X 4 MM MISC, USE 1 PEN THREE TIMES DAILY AS DIRECTED, Disp: 100 each, Rfl: 3 .  bisacodyl (DULCOLAX) 10 MG suppository, Place 10 mg rectally daily as needed for moderate constipation., Disp: , Rfl:  .  calamine lotion, Apply 1 application topically as needed for itching., Disp: 120 mL, Rfl: 0 .  carbamide peroxide (DEBROX) 6.5 % OTIC solution, Place 5 drops into both ears 2 (two) times daily., Disp: 15 mL, Rfl: 0 .  clopidogrel (PLAVIX) 75 MG tablet, Take 1 tablet (75 mg total) by mouth daily., Disp: 90 tablet, Rfl: 1 .  Dulaglutide (TRULICITY) 8.67 EH/2.0NO SOPN, Administer 0.75mg  under the skin One time a Week.,  Disp: 9 mL, Rfl: 1 .  ezetimibe (ZETIA) 10 MG tablet, TAKE 1 TABLET(10 MG) BY MOUTH DAILY, Disp: 90 tablet, Rfl: 1 .  glucose blood (ACCU-CHEK AVIVA PLUS) test strip, Use to test blood sugar 3 times daily. Dx: E11.42, Disp: 300 each, Rfl: 1 .  HYDROcodone-acetaminophen (NORCO) 10-325 MG tablet, Take 1 tablet by mouth 3 (three) times daily as needed., Disp: 90 tablet, Rfl: 0 .  insulin detemir (LEVEMIR) 100 UNIT/ML FlexPen, Inject 20 Units into the skin at bedtime., Disp: 30 mL, Rfl: 1 .  insulin lispro (HUMALOG KWIKPEN) 100 UNIT/ML KwikPen, Inject 8-10 units into the skin per sliding scale three times daily with meals., Disp: 45 mL, Rfl: 3 .  isosorbide mononitrate (IMDUR) 60 MG 24 hr tablet, Take 1 tablet (60 mg total) by mouth daily., Disp: 90 tablet, Rfl: 1 .  losartan (COZAAR) 25 MG tablet, TAKE 1/2 TABLET(12.5 MG) BY MOUTH DAILY, Disp: 45 tablet, Rfl: 2 .  metoprolol tartrate (LOPRESSOR) 25 MG tablet, TAKE 1/2 TABLET(12.5 MG) BY MOUTH TWICE DAILY, Disp: 90 tablet, Rfl: 1 .  nitroGLYCERIN (NITROSTAT) 0.4 MG SL tablet, Dissolve one tablet under tongue as needed for chest pain., Disp: 30 tablet, Rfl: 3 .  REPATHA 140 MG/ML SOSY, Inject 140 mg into the skin every 14 (fourteen) days., Disp: 2.1 mL, Rfl: 3 .  saccharomyces boulardii (FLORASTOR) 250 MG capsule, Take 1 capsule (250 mg total) by mouth 2 (two) times daily., Disp: 20 capsule, Rfl: 0 .  senna (SENOKOT) 8.6 MG TABS tablet, Take 1 tablet (8.6 mg total) by mouth daily., Disp: 120 tablet, Rfl: 5 .  Tdap (BOOSTRIX) 5-2.5-18.5 LF-MCG/0.5 injection, Inject 0.5 mLs into the muscle once for 1 dose., Disp: 0.5 mL, Rfl: 0  No orders of the defined types were placed in this encounter.  --Continue cardiac medications as reconciled in final medication list. --No follow-ups on file. Or sooner if needed. --Continue follow-up with your primary care physician regarding the management of your other chronic comorbid conditions.  Patient's questions and  concerns were addressed to his satisfaction. He voices understanding of the instructions provided during this encounter.   This note was created using a voice recognition software as a result there may be grammatical errors inadvertently enclosed that do not reflect the nature of this encounter. Every attempt is made to correct such errors.  Total time spent: 30 minutes.  [name], [place], [facility]  Pager: [phone] Office: [phone]

## 2020-09-08 NOTE — Patient Instructions (Signed)
-   Instil 5 drops debrox 6.5 5 otic solution twice daily for 4 days then follow up in one week for ear lavaged ( to flush your ears ).

## 2020-09-08 NOTE — Progress Notes (Signed)
Provider: Marlowe Sax FNP-C   Jesstin Studstill, [name], NP  Patient Care Team: Kazi Reppond, [name], NP as PCP - General (Family Medicine)  Extended Emergency Contact Information Primary Emergency Contact: Vladimir, [place] Mobile Phone: [phone] Relation: Daughter  Code Status:  Goals of care: Advanced Directive information Advanced Directives 09/08/2020  Does Patient Have a Medical Advance Directive? No  Type of Advance Directive -  Does patient want to make changes to medical advance directive? -  Copy of [place] in Chart? -  Would patient like information on creating a medical advance directive? No - Patient declined     Chief Complaint  Patient presents with  . Medical Management of Chronic Issues    6 month follow up.  Marland Kitchen Health Maintenance    Discuss the need for Foot exam, and Eye exam  . Immunizations    Discuss the need for Tetanus Vaccine, and Covid Booster.    HPI:  Pt is a 75 y.o. male seen today 6 months follow up for medical management of chronic diseases.He has a medical history Hypertension,type 2 DM,CAD,Hx of MI,GERD,Hyperlipidemia,CKD stage 3,hx of stroke among other conditions.He is here with daughter today.Daughter states for the past couple of days he has not been getting out of bed and not taking his medication as directed.Has upcoming procedure to extract teeth was advised to hold Plavix but sure whether he has been taking Plavix or not. Daughter will verify then notify Orthodontists.    He states blood sugars running in the upper 70's -200's .   Not drinking beer as he used to. Her reports no signs of acute respiratory infection or urinary tract infection. Also denies any depression.  Lab worker done 2 days ago indicated normal CBC and TSH level.Glucose 142,CR 2.38 ( Previous 1.94) ,BUN 38,Hgb A1C 8.1 ( previous 6.6), chol 166,TRG 145,LDL 107 Daughter states patient run out of his Norco so he has been taking ibuprofen more often.He did  not call pharmacy or provider for Norco refills until yesterday.  He declines to move in with daughter where he can be assisted with his medication.     Past Medical History:  Diagnosis Date  . Bell palsy   . CKD (chronic kidney disease), stage III ([place]) 12/16/2018  . Coronary artery disease involving native coronary artery of native heart with unstable angina pectoris ([place]) 12/16/2018  . GERD (gastroesophageal reflux disease)   . Hypertension   . Hypertension with heart disease 12/16/2018  . Myocardial infarction ([place])   . Pure hypercholesterolemia 12/16/2018  . Stroke ([place])   . Type 2 diabetes, controlled, with peripheral neuropathy ([place]) 12/16/2018   Past Surgical History:  Procedure Laterality Date  . CARDIAC SURGERY    . COLONOSCOPY WITH PROPOFOL N/A 01/02/2018   Procedure: COLONOSCOPY WITH PROPOFOL;  Surgeon: Carol Ada, MD;  Location: [facility];  Service: Endoscopy;  Laterality: N/A;  . CORONARY STENT INTERVENTION N/A 01/26/2019   Procedure: CORONARY STENT INTERVENTION;  Surgeon: Adrian Prows, MD;  Location: [facility];  Service: Cardiovascular;  Laterality: N/A;  . LEFT HEART CATH AND CORS/GRAFTS ANGIOGRAPHY N/A 01/05/2019   Procedure: LEFT HEART CATH AND CORS/GRAFTS ANGIOGRAPHY;  Surgeon: Adrian Prows, MD;  Location: [facility];  Service: Cardiovascular;  Laterality: N/A;  . POLYPECTOMY  01/02/2018   Procedure: POLYPECTOMY;  Surgeon: Carol Ada, MD;  Location: [facility];  Service: Endoscopy;;  . stents      No Known Allergies  Allergies as of 09/08/2020   No  Known Allergies     Medication List       Accurate as of September 08, 2020  9:24 AM. If you have any questions, ask your nurse or doctor.        Accu-Chek Aviva Plus test strip Generic drug: glucose blood Use to test blood sugar 3 times daily. Dx: E11.42   amLODipine 5 MG tablet Commonly known as: NORVASC TAKE 1 TABLET(5 MG) BY MOUTH DAILY   amoxicillin-clavulanate 875-125 MG tablet Commonly  known as: AUGMENTIN Take 1 tablet by mouth 2 (two) times daily.   BD Pen Needle Nano 2nd Gen 32G X 4 MM Misc Generic drug: Insulin Pen Needle USE 1 PEN THREE TIMES DAILY AS DIRECTED   bisacodyl 10 MG suppository Commonly known as: DULCOLAX Place 10 mg rectally daily as needed for moderate constipation.   calamine lotion Apply 1 application topically as needed for itching.   clopidogrel 75 MG tablet Commonly known as: Plavix Take 1 tablet (75 mg total) by mouth daily.   ezetimibe 10 MG tablet Commonly known as: ZETIA TAKE 1 TABLET(10 MG) BY MOUTH DAILY   HYDROcodone-acetaminophen 10-325 MG tablet Commonly known as: NORCO Take 1 tablet by mouth 3 (three) times daily as needed.   insulin detemir 100 UNIT/ML FlexPen Commonly known as: LEVEMIR Inject 20 Units into the skin at bedtime.   insulin lispro 100 UNIT/ML KwikPen Commonly known as: HumaLOG KwikPen Inject 8-10 units into the skin per sliding scale three times daily with meals.   isosorbide mononitrate 60 MG 24 hr tablet Commonly known as: IMDUR Take 1 tablet (60 mg total) by mouth daily.   losartan 25 MG tablet Commonly known as: COZAAR TAKE 1/2 TABLET(12.5 MG) BY MOUTH DAILY   metoprolol tartrate 25 MG tablet Commonly known as: LOPRESSOR TAKE 1/2 TABLET(12.5 MG) BY MOUTH TWICE DAILY   nitroGLYCERIN 0.4 MG SL tablet Commonly known as: NITROSTAT Dissolve one tablet under tongue as needed for chest pain.   Repatha 140 MG/ML Sosy Generic drug: Evolocumab Inject 140 mg into the skin every 14 (fourteen) days.   saccharomyces boulardii 250 MG capsule Commonly known as: Florastor Take 1 capsule (250 mg total) by mouth 2 (two) times daily.   senna 8.6 MG Tabs tablet Commonly known as: SENOKOT Take 1 tablet (8.6 mg total) by mouth daily.   Trulicity 4.43 XV/4.0GQ Sopn Generic drug: Dulaglutide Administer 0.60m under the skin One time a Week.       Review of Systems  Constitutional: Negative for appetite  change, chills, fatigue and fever.  HENT: Negative for congestion, rhinorrhea, sinus pressure, sinus pain, sneezing, sore throat and trouble swallowing.   Eyes: Negative for discharge, redness and itching.  Respiratory: Negative for cough, chest tightness, shortness of breath and wheezing.   Cardiovascular: Negative for chest pain, palpitations and leg swelling.  Gastrointestinal: Negative for abdominal distention, abdominal pain, constipation, diarrhea, nausea and vomiting.  Endocrine: Negative for cold intolerance, heat intolerance, polydipsia, polyphagia and polyuria.  Genitourinary: Negative for difficulty urinating, dysuria, flank pain, frequency, hematuria and urgency.  Musculoskeletal: Positive for arthralgias, back pain and gait problem. Negative for joint swelling, myalgias, neck pain and neck stiffness.  Skin: Negative for color change, pallor, rash and wound.  Neurological: Negative for dizziness, syncope, facial asymmetry, speech difficulty, weakness, light-headedness and headaches.  Hematological: Does not bruise/bleed easily.  Psychiatric/Behavioral: Negative for agitation, behavioral problems, confusion and sleep disturbance. The patient is not nervous/anxious.     Immunization History  Administered Date(s) Administered  . PFIZER(Purple  Top)SARS-COV-2 Vaccination 08/13/2019, 09/06/2019  . Pneumococcal Conjugate-13 01/14/2020   Pertinent  Health Maintenance Due  Topic Date Due  . FOOT EXAM  Never done  . OPHTHALMOLOGY EXAM  Never done  . INFLUENZA VACCINE  01/01/2021  . PNA vac Low Risk Adult (2 of 2 - PPSV23) 01/13/2021  . HEMOGLOBIN A1C  03/08/2021  . COLONOSCOPY (Pts 45-75yr Insurance coverage will need to be confirmed)  01/03/2028   Fall Risk  09/08/2020 08/30/2020 06/26/2020 06/12/2020 05/11/2020  Falls in the past year? 0 0 0 0 0  Number falls in past yr: 0 0 0 0 0  Injury with Fall? 0 0 0 0 0   Functional Status Survey:    Vitals:   09/08/20 0906  BP: 130/80   Pulse: 100  Resp: 18  Temp: (!) 96.6 F (35.9 C)  SpO2: 93%  Weight: 244 lb 9.6 oz (110.9 kg)  Height: _0  (1.676 m)   Body mass index is 39.48 kg/m. Physical Exam Vitals reviewed.  Constitutional:      General: He is not in acute distress.    Appearance: He is obese. He is not ill-appearing.  HENT:     Head: Normocephalic.     Right Ear: There is impacted cerumen.     Left Ear: There is impacted cerumen.     Ears:     Comments: Declined bilateral ear lavage this visit states next time.     Nose: Nose normal. No congestion or rhinorrhea.     Mouth/Throat:     Mouth: Mucous membranes are moist.     Pharynx: Oropharynx is clear. No oropharyngeal exudate or posterior oropharyngeal erythema.  Eyes:     General: No scleral icterus.       Right eye: No discharge.        Left eye: No discharge.     Extraocular Movements: Extraocular movements intact.     Conjunctiva/sclera: Conjunctivae normal.     Pupils: Pupils are equal, round, and reactive to light.  Neck:     Vascular: No carotid bruit.  Cardiovascular:     Rate and Rhythm: Normal rate and regular rhythm.     Pulses: Normal pulses.     Heart sounds: Normal heart sounds. No murmur heard. No friction rub. No gallop.   Pulmonary:     Effort: Pulmonary effort is normal. No respiratory distress.     Breath sounds: Normal breath sounds. No wheezing, rhonchi or rales.  Chest:     Chest wall: No tenderness.  Abdominal:     General: Bowel sounds are normal. There is no distension.     Palpations: Abdomen is soft. There is no mass.     Tenderness: There is no abdominal tenderness. There is no right CVA tenderness, left CVA tenderness, guarding or rebound.  Musculoskeletal:        General: No swelling or tenderness. Normal range of motion.     Cervical back: Normal range of motion. No rigidity or tenderness.     Right lower leg: No edema.     Left lower leg: No edema.  Lymphadenopathy:     Cervical: No cervical  adenopathy.  Skin:    General: Skin is warm and dry.     Coloration: Skin is not pale.     Findings: No bruising, erythema or rash.  Neurological:     Mental Status: He is alert and oriented to person, place, and time.     Cranial Nerves: No cranial nerve deficit.  Sensory: No sensory deficit.     Motor: No weakness.     Coordination: Coordination normal.     Gait: Gait normal.  Psychiatric:        Mood and Affect: Mood normal.        Speech: Speech normal.        Behavior: Behavior normal.        Thought Content: Thought content normal.        Judgment: Judgment normal.     Labs reviewed: Recent Labs    06/12/20 1429 06/26/20 1627 09/06/20 0900  NA 138 137 136  K 4.6 4.4 4.2  CL 106 105 109  CO2 23 19* 12*  GLUCOSE 147* 246* 142*  BUN 33* 32* 38*  CREATININE 1.93* 1.94* 2.38*  CALCIUM 8.9 9.4 9.7   Recent Labs    09/16/19 0835 01/10/20 0846 06/12/20 1429 06/26/20 1627 09/06/20 0900  AST 17   < > 42* 14 18  ALT 22   < > 42 20 16  ALKPHOS 87  --   --   --   --   BILITOT 1.0   < > 0.4 0.6 0.3  PROT 7.6   < > 7.2 7.1 8.0  ALBUMIN 3.8  --   --   --   --    < > = values in this interval not displayed.   Recent Labs    05/08/20 0828 06/26/20 1627 09/06/20 0900  WBC 8.9 8.1 10.0  NEUTROABS 5,037 5,654 6,470  HGB 14.9 14.4 15.9  HCT 44.5 42.1 47.1  MCV 90.8 89.2 91.8  PLT 156 275 214   Lab Results  Component Value Date   TSH 1.63 09/06/2020   Lab Results  Component Value Date   HGBA1C 8.1 (H) 09/06/2020   Lab Results  Component Value Date   CHOL 166 09/06/2020   HDL 33 (L) 09/06/2020   LDLCALC 107 (H) 09/06/2020   TRIG 145 09/06/2020   CHOLHDL 5.0 (H) 09/06/2020    Significant Diagnostic Results in last 30 days:  No results found.  Assessment/Plan  1. Benign hypertension with chronic kidney disease, stage III (HCC) B/p well controlled  - AMB Referral to [facility] requires medication assistance not taking medication as  directed.  - continue on Am;lodipine,imdur,losartan and metoprolol  - CBC with Differential/Platelet; Future - CMP with eGFR([facility]); Future - TSH; Future - Lipid panel; Future  2. Type 2 diabetes, controlled, with peripheral neuropathy (HCC) Lab Results  Component Value Date   HGBA1C 8.1 (H) 09/06/2020   A1C worsen compared to previous.possible not taking medication. - continue on levemir 20 units,Humalog 8-10 units per SSI and Trulicity  - Ambulatory referral to Podiatry - AMB Referral to [facility] for medication management. - continue to follow up with Ophthalmologist and Podiatrist  - TSH; Future - Lipid panel; Future - Hemoglobin A1c; Future  3. Stage 3b chronic kidney disease ([place]) CR level has worsen compared to previous.Has been taking ibuprofen more often. - AMB Referral to [facility]  4. hyperlipidemia  LDL not at goal. - continue on Zetia  - dietary modification and exercise advised.  - AMB Referral to [facility] for medication management  - Lipid panel; Future  5. Primary osteoarthritis involving multiple joints Advised to avoid ibuprofen  - continue on Norco  -PDMP reviewed.Narcotic contract in place.  - AMB Referral to [facility] for assistance with personal care assistant to assist with ADL's.   6.  Coronary artery disease involving native coronary artery of native heart with unstable angina pectoris ([place]) No chest pain reported. - continue on current medication.Plavix on hold for up coming teeth extraction but unclear whether he has been taking Plavix or not daughter will notify orthodontists.  B/p well controlled. - AMB Referral to [facility] for medication management    7. Need for Tdap vaccination Advised to get Tdap vaccine at his pharmacy script previous send to pharmacy but did not go to get vaccine.discussed with daughter to take him to pharmacy.  - Tdap (BOOSTRIX) 5-2.5-18.5 LF-MCG/0.5 injection; Inject  0.5 mLs into the muscle once for 1 dose.  Dispense: 0.5 mL; Refill: 0  8. Bilateral impacted cerumen Decline bilateral cerumen lavage today states next time.Advised to apply debrox as below then follow up for bilateral ear lavage in 1 week   - carbamide peroxide (DEBROX) 6.5 % OTIC solution; Place 5 drops into both ears 2 (two) times daily.  Dispense: 15 mL; Refill: 0  Family/ staff Communication: Reviewed plan of care with patient  Labs/tests ordered: - CBC with Differential/Platelet - CMP with eGFR([facility]) - TSH - Hgb A1C - Lipid panel  Next Appointment : 6 months for medical management of chronic issues.Fasting Labs prior to visit.one week for bilateral ear lavage.    Sandrea Hughs, NP

## 2020-09-11 ENCOUNTER — Other Ambulatory Visit: Payer: Self-pay

## 2020-09-11 ENCOUNTER — Inpatient Hospital Stay (HOSPITAL_COMMUNITY)
Admission: EM | Admit: 2020-09-11 | Discharge: 2020-09-15 | DRG: 683 | Disposition: A | Discharge status: Home Health | Payer: Medicare Other | Attending: Internal Medicine | Admitting: Internal Medicine

## 2020-09-11 ENCOUNTER — Other Ambulatory Visit: Payer: Self-pay | Admitting: *Deleted

## 2020-09-11 ENCOUNTER — Telehealth: Payer: Self-pay | Admitting: *Deleted

## 2020-09-11 ENCOUNTER — Ambulatory Visit: Payer: Medicare HMO | Admitting: Cardiology

## 2020-09-11 DIAGNOSIS — Z955 Presence of coronary angioplasty implant and graft: Secondary | ICD-10-CM

## 2020-09-11 DIAGNOSIS — K1379 Other lesions of oral mucosa: Secondary | ICD-10-CM | POA: Diagnosis present

## 2020-09-11 DIAGNOSIS — Z20822 Contact with and (suspected) exposure to covid-19: Secondary | ICD-10-CM | POA: Diagnosis present

## 2020-09-11 DIAGNOSIS — E7800 Pure hypercholesterolemia, unspecified: Secondary | ICD-10-CM

## 2020-09-11 DIAGNOSIS — I129 Hypertensive chronic kidney disease with stage 1 through stage 4 chronic kidney disease, or unspecified chronic kidney disease: Secondary | ICD-10-CM | POA: Diagnosis not present

## 2020-09-11 DIAGNOSIS — R531 Weakness: Secondary | ICD-10-CM

## 2020-09-11 DIAGNOSIS — Z66 Do not resuscitate: Secondary | ICD-10-CM | POA: Diagnosis present

## 2020-09-11 DIAGNOSIS — E860 Dehydration: Secondary | ICD-10-CM | POA: Diagnosis not present

## 2020-09-11 DIAGNOSIS — Z87891 Personal history of nicotine dependence: Secondary | ICD-10-CM | POA: Diagnosis not present

## 2020-09-11 DIAGNOSIS — E872 Acidosis, unspecified: Secondary | ICD-10-CM | POA: Diagnosis present

## 2020-09-11 DIAGNOSIS — E669 Obesity, unspecified: Secondary | ICD-10-CM | POA: Diagnosis present

## 2020-09-11 DIAGNOSIS — I252 Old myocardial infarction: Secondary | ICD-10-CM | POA: Diagnosis not present

## 2020-09-11 DIAGNOSIS — Z8249 Family history of ischemic heart disease and other diseases of the circulatory system: Secondary | ICD-10-CM

## 2020-09-11 DIAGNOSIS — K219 Gastro-esophageal reflux disease without esophagitis: Secondary | ICD-10-CM | POA: Diagnosis present

## 2020-09-11 DIAGNOSIS — K0889 Other specified disorders of teeth and supporting structures: Secondary | ICD-10-CM | POA: Diagnosis present

## 2020-09-11 DIAGNOSIS — I119 Hypertensive heart disease without heart failure: Secondary | ICD-10-CM

## 2020-09-11 DIAGNOSIS — N19 Unspecified kidney failure: Secondary | ICD-10-CM | POA: Diagnosis not present

## 2020-09-11 DIAGNOSIS — Z79899 Other long term (current) drug therapy: Secondary | ICD-10-CM

## 2020-09-11 DIAGNOSIS — Z794 Long term (current) use of insulin: Secondary | ICD-10-CM

## 2020-09-11 DIAGNOSIS — Z951 Presence of aortocoronary bypass graft: Secondary | ICD-10-CM

## 2020-09-11 DIAGNOSIS — I10 Essential (primary) hypertension: Secondary | ICD-10-CM | POA: Diagnosis not present

## 2020-09-11 DIAGNOSIS — Z6837 Body mass index (BMI) 37.0-37.9, adult: Secondary | ICD-10-CM | POA: Diagnosis not present

## 2020-09-11 DIAGNOSIS — E876 Hypokalemia: Secondary | ICD-10-CM | POA: Diagnosis present

## 2020-09-11 DIAGNOSIS — E1142 Type 2 diabetes mellitus with diabetic polyneuropathy: Secondary | ICD-10-CM | POA: Diagnosis not present

## 2020-09-11 DIAGNOSIS — N179 Acute kidney failure, unspecified: Secondary | ICD-10-CM | POA: Diagnosis not present

## 2020-09-11 DIAGNOSIS — N1832 Chronic kidney disease, stage 3b: Secondary | ICD-10-CM | POA: Diagnosis not present

## 2020-09-11 DIAGNOSIS — I2510 Atherosclerotic heart disease of native coronary artery without angina pectoris: Secondary | ICD-10-CM | POA: Diagnosis present

## 2020-09-11 DIAGNOSIS — E785 Hyperlipidemia, unspecified: Secondary | ICD-10-CM | POA: Diagnosis present

## 2020-09-11 DIAGNOSIS — E1165 Type 2 diabetes mellitus with hyperglycemia: Secondary | ICD-10-CM | POA: Diagnosis not present

## 2020-09-11 DIAGNOSIS — N1831 Chronic kidney disease, stage 3a: Secondary | ICD-10-CM

## 2020-09-11 DIAGNOSIS — E1159 Type 2 diabetes mellitus with other circulatory complications: Secondary | ICD-10-CM

## 2020-09-11 DIAGNOSIS — Z7902 Long term (current) use of antithrombotics/antiplatelets: Secondary | ICD-10-CM

## 2020-09-11 DIAGNOSIS — Z8673 Personal history of transient ischemic attack (TIA), and cerebral infarction without residual deficits: Secondary | ICD-10-CM

## 2020-09-11 DIAGNOSIS — E1122 Type 2 diabetes mellitus with diabetic chronic kidney disease: Secondary | ICD-10-CM | POA: Diagnosis present

## 2020-09-11 DIAGNOSIS — I25110 Atherosclerotic heart disease of native coronary artery with unstable angina pectoris: Secondary | ICD-10-CM | POA: Diagnosis not present

## 2020-09-11 LAB — I-STAT CHEM 8, ED
BUN: 112 mg/dL — ABNORMAL HIGH (ref 8–23)
Calcium, Ion: 1.07 mmol/L — ABNORMAL LOW (ref 1.15–1.40)
Chloride: 114 mmol/L — ABNORMAL HIGH (ref 98–111)
Creatinine, Ser: 3.5 mg/dL — ABNORMAL HIGH (ref 0.61–1.24)
Glucose, Bld: 157 mg/dL — ABNORMAL HIGH (ref 70–99)
HCT: 41 % (ref 39.0–52.0)
Hemoglobin: 13.9 g/dL (ref 13.0–17.0)
Potassium: 3.9 mmol/L (ref 3.5–5.1)
Sodium: 137 mmol/L (ref 135–145)
TCO2: 13 mmol/L — ABNORMAL LOW (ref 22–32)

## 2020-09-11 LAB — CBC WITH DIFFERENTIAL/PLATELET
Abs Immature Granulocytes: 0.08 10*3/uL — ABNORMAL HIGH (ref 0.00–0.07)
Basophils Absolute: 0.1 10*3/uL (ref 0.0–0.1)
Basophils Relative: 1 %
Eosinophils Absolute: 0.2 10*3/uL (ref 0.0–0.5)
Eosinophils Relative: 2 %
HCT: 46.9 % (ref 39.0–52.0)
Hemoglobin: 16.5 g/dL (ref 13.0–17.0)
Immature Granulocytes: 1 %
Lymphocytes Relative: 24 %
Lymphs Abs: 2.5 10*3/uL (ref 0.7–4.0)
MCH: 30.9 pg (ref 26.0–34.0)
MCHC: 35.2 g/dL (ref 30.0–36.0)
MCV: 87.8 fL (ref 80.0–100.0)
Monocytes Absolute: 1 10*3/uL (ref 0.1–1.0)
Monocytes Relative: 10 %
Neutro Abs: 6.6 10*3/uL (ref 1.7–7.7)
Neutrophils Relative %: 62 %
Platelets: 178 10*3/uL (ref 150–400)
RBC: 5.34 MIL/uL (ref 4.22–5.81)
RDW: 14.9 % (ref 11.5–15.5)
WBC: 10.5 10*3/uL (ref 4.0–10.5)
nRBC: 0 % (ref 0.0–0.2)

## 2020-09-11 LAB — SARS CORONAVIRUS 2 (TAT 6-24 HRS): SARS Coronavirus 2: NEGATIVE

## 2020-09-11 LAB — COMPREHENSIVE METABOLIC PANEL
ALT: 16 U/L (ref 0–44)
AST: 17 U/L (ref 15–41)
Albumin: 3.8 g/dL (ref 3.5–5.0)
Alkaline Phosphatase: 75 U/L (ref 38–126)
Anion gap: 16 — ABNORMAL HIGH (ref 5–15)
BUN: 111 mg/dL — ABNORMAL HIGH (ref 8–23)
CO2: 8 mmol/L — ABNORMAL LOW (ref 22–32)
Calcium: 8.5 mg/dL — ABNORMAL LOW (ref 8.9–10.3)
Chloride: 110 mmol/L (ref 98–111)
Creatinine, Ser: 4.05 mg/dL — ABNORMAL HIGH (ref 0.61–1.24)
GFR, Estimated: 15 mL/min — ABNORMAL LOW (ref >60)
Glucose, Bld: 167 mg/dL — ABNORMAL HIGH (ref 70–99)
Potassium: 3.2 mmol/L — ABNORMAL LOW (ref 3.5–5.1)
Sodium: 134 mmol/L — ABNORMAL LOW (ref 135–145)
Total Bilirubin: 0.9 mg/dL (ref 0.3–1.2)
Total Protein: 8 g/dL (ref 6.5–8.1)

## 2020-09-11 LAB — GLUCOSE, CAPILLARY: Glucose-Capillary: 180 mg/dL — ABNORMAL HIGH (ref 70–99)

## 2020-09-11 LAB — CBG MONITORING, ED: Glucose-Capillary: 160 mg/dL — ABNORMAL HIGH (ref 70–99)

## 2020-09-11 MED ORDER — SACCHAROMYCES BOULARDII 250 MG PO CAPS
250.0000 mg | ORAL_CAPSULE | Freq: Two times a day (BID) | ORAL | Status: DC
  Administered 2020-09-11 – 2020-09-15 (×8): 250 mg via ORAL
  Filled 2020-09-11 (×8): qty 1

## 2020-09-11 MED ORDER — AMOXICILLIN-POT CLAVULANATE 875-125 MG PO TABS: 1.0000 | ORAL_TABLET | Freq: Two times a day (BID) | ORAL | Status: DC

## 2020-09-11 MED ORDER — ALBUTEROL SULFATE (2.5 MG/3ML) 0.083% IN NEBU: 2.5000 mg | INHALATION_SOLUTION | Freq: Four times a day (QID) | RESPIRATORY_TRACT | Status: DC | PRN

## 2020-09-11 MED ORDER — ISOSORBIDE MONONITRATE ER 60 MG PO TB24
60.0000 mg | ORAL_TABLET | Freq: Every day | ORAL | Status: DC
  Administered 2020-09-11 – 2020-09-15 (×5): 60 mg via ORAL
  Filled 2020-09-11 (×5): qty 1

## 2020-09-11 MED ORDER — SODIUM CHLORIDE 0.9 % IV BOLUS
1000.0000 mL | Freq: Once | INTRAVENOUS | Status: AC
  Administered 2020-09-11: 1000 mL via INTRAVENOUS

## 2020-09-11 MED ORDER — INSULIN ASPART 100 UNIT/ML ~~LOC~~ SOLN
0.0000 [IU] | Freq: Three times a day (TID) | SUBCUTANEOUS | Status: DC
  Administered 2020-09-12: 2 [IU] via SUBCUTANEOUS
  Administered 2020-09-12: 3 [IU] via SUBCUTANEOUS
  Administered 2020-09-12: 2 [IU] via SUBCUTANEOUS
  Administered 2020-09-13: 5 [IU] via SUBCUTANEOUS
  Administered 2020-09-13: 8 [IU] via SUBCUTANEOUS
  Administered 2020-09-13 – 2020-09-14 (×2): 3 [IU] via SUBCUTANEOUS
  Administered 2020-09-14: 5 [IU] via SUBCUTANEOUS
  Administered 2020-09-14: 8 [IU] via SUBCUTANEOUS
  Administered 2020-09-15: 5 [IU] via SUBCUTANEOUS
  Administered 2020-09-15 (×2): 3 [IU] via SUBCUTANEOUS

## 2020-09-11 MED ORDER — STERILE WATER FOR INJECTION IV SOLN
INTRAVENOUS | Status: DC
  Filled 2020-09-11 (×2): qty 1000

## 2020-09-11 MED ORDER — SODIUM CHLORIDE 0.9% FLUSH
3.0000 mL | Freq: Two times a day (BID) | INTRAVENOUS | Status: DC
  Administered 2020-09-11 – 2020-09-15 (×6): 3 mL via INTRAVENOUS

## 2020-09-11 MED ORDER — SODIUM CHLORIDE 0.9 % IV SOLN: INTRAVENOUS | Status: DC

## 2020-09-11 MED ORDER — SODIUM BICARBONATE 8.4 % IV SOLN: INTRAVENOUS | Status: DC

## 2020-09-11 MED ORDER — ONDANSETRON HCL 4 MG PO TABS: 4.0000 mg | ORAL_TABLET | Freq: Four times a day (QID) | ORAL | Status: DC | PRN

## 2020-09-11 MED ORDER — AMLODIPINE BESYLATE 5 MG PO TABS
5.0000 mg | ORAL_TABLET | Freq: Every day | ORAL | Status: DC
  Administered 2020-09-11 – 2020-09-14 (×4): 5 mg via ORAL
  Filled 2020-09-11 (×4): qty 1

## 2020-09-11 MED ORDER — HEPARIN SODIUM (PORCINE) 5000 UNIT/ML IJ SOLN
5000.0000 [IU] | Freq: Three times a day (TID) | INTRAMUSCULAR | Status: DC
  Administered 2020-09-11 – 2020-09-15 (×12): 5000 [IU] via SUBCUTANEOUS
  Filled 2020-09-11 (×11): qty 1

## 2020-09-11 MED ORDER — METOPROLOL TARTRATE 12.5 MG HALF TABLET
12.5000 mg | ORAL_TABLET | Freq: Two times a day (BID) | ORAL | Status: DC
  Administered 2020-09-11 – 2020-09-15 (×8): 12.5 mg via ORAL
  Filled 2020-09-11 (×9): qty 1

## 2020-09-11 MED ORDER — AMOXICILLIN-POT CLAVULANATE 500-125 MG PO TABS
1.0000 | ORAL_TABLET | Freq: Two times a day (BID) | ORAL | Status: DC
  Administered 2020-09-11 – 2020-09-15 (×8): 500 mg via ORAL
  Filled 2020-09-11 (×8): qty 1

## 2020-09-11 MED ORDER — MAGIC MOUTHWASH W/LIDOCAINE
2.0000 mL | Freq: Three times a day (TID) | ORAL | Status: DC | PRN
  Filled 2020-09-11: qty 5

## 2020-09-11 MED ORDER — EZETIMIBE 10 MG PO TABS
10.0000 mg | ORAL_TABLET | Freq: Every day | ORAL | Status: DC
  Administered 2020-09-11 – 2020-09-15 (×5): 10 mg via ORAL
  Filled 2020-09-11 (×5): qty 1

## 2020-09-11 MED ORDER — HYDROCODONE-ACETAMINOPHEN 10-325 MG PO TABS: 1.0000 | ORAL_TABLET | Freq: Three times a day (TID) | ORAL | Status: DC | PRN

## 2020-09-11 MED ORDER — ONDANSETRON HCL 4 MG/2ML IJ SOLN: 4.0000 mg | Freq: Four times a day (QID) | INTRAMUSCULAR | Status: DC | PRN

## 2020-09-11 MED ORDER — INSULIN ASPART 100 UNIT/ML ~~LOC~~ SOLN
0.0000 [IU] | Freq: Every day | SUBCUTANEOUS | Status: DC
  Administered 2020-09-12: 2 [IU] via SUBCUTANEOUS
  Administered 2020-09-13: 3 [IU] via SUBCUTANEOUS
  Administered 2020-09-14: 2 [IU] via SUBCUTANEOUS

## 2020-09-11 MED ORDER — INSULIN ASPART 100 UNIT/ML ~~LOC~~ SOLN: 0.0000 [IU] | Freq: Three times a day (TID) | SUBCUTANEOUS | Status: DC

## 2020-09-11 NOTE — ED Notes (Signed)
Pt requested water to drink

## 2020-09-11 NOTE — Telephone Encounter (Signed)
Daughter called back and wanted to check on referral status. Transferred to referral.

## 2020-09-11 NOTE — Discharge Instructions (Signed)
Though it is difficult, please try to stay well-hydrated.  Monitor your condition carefully and do not hesitate to return here for concerning changes in your condition.

## 2020-09-11 NOTE — Patient Outreach (Signed)
[facility]) Care Management  09/11/2020  [name] December 14, 1945 [id number]   Referral Date: 4/8 Referral Source: MD office Referral Reason: medication management, need for ADL assistance  Insurance: [facility]   Noted member currently in the ED, will not attempt outreach at this time.    Plan: RN CM will make initial outreach attempt within the next 2 days pending disposition.  [name], [place], MSN [place] [phone]

## 2020-09-11 NOTE — Telephone Encounter (Signed)
Patient daughter, David Burton, called and left message on voicemail stating that she was taking patient to the hospital. Wanted to check the update of the [facility] referral.   Tried calling daughter back and Mail Box is full and cannot leave Voicemail.

## 2020-09-11 NOTE — ED Triage Notes (Signed)
Arrived POV; reported recent oral surgery and injured tongue. Concern for decreased oral intake x 4 days, hx of DM.

## 2020-09-11 NOTE — ED Provider Notes (Signed)
[facility] EMERGENCY DEPARTMENT Provider Note   CSN: [id number] Arrival date & time: 09/11/20  1010     History Chief Complaint  Patient presents with  . Mouth Injury  . Decreased Oral Intake    Gurney Balthazor is a 75 y.o. male.  HPI Patient with a history of diabetes presents 3 days after oral surgery, now with concern for decreased oral intake, weakness.  Patient notes that due to a tongue injury he sustained during the procedure he has had minimal willingness to eat or drink since that time.  No focal weakness, no syncope, no reported fever, no vomiting, no diarrhea. Beyond the patient's history of diabetes he has CKD, hypertension.  Is unclear if he has been taking his medication as directed. No new fever.    Past Medical History:  Diagnosis Date  . Bell palsy   . CKD (chronic kidney disease), stage III ([place]) 12/16/2018  . Coronary artery disease involving native coronary artery of native heart with unstable angina pectoris ([place]) 12/16/2018  . GERD (gastroesophageal reflux disease)   . Hypertension   . Hypertension with heart disease 12/16/2018  . Myocardial infarction ([place])   . Pure hypercholesterolemia 12/16/2018  . Stroke ([place])   . Type 2 diabetes, controlled, with peripheral neuropathy ([place]) 12/16/2018    Patient Active Problem List   Diagnosis Date Noted  . Alcohol use 01/30/2020  . History of stroke 01/30/2020  . Hyponatremia 01/30/2020  . GERD (gastroesophageal reflux disease)   . Diastolic dysfunction 98/92/1194  . Neurocognitive deficits 09/23/2019  . Elbow swelling, left   . Generalized weakness   . Right pontine stroke ([place]) 09/16/2019  . Hypertension with heart disease 12/16/2018  . CAD (coronary artery disease), native coronary artery 12/16/2018  . CKD (chronic kidney disease), stage III ([place]) 12/16/2018  . Pure hypercholesterolemia 12/16/2018  . Type 2 diabetes, controlled, with peripheral neuropathy ([place]) 12/16/2018  . Lower abdominal  pain 05/24/2015    Past Surgical History:  Procedure Laterality Date  . CARDIAC SURGERY    . COLONOSCOPY WITH PROPOFOL N/A 01/02/2018   Procedure: COLONOSCOPY WITH PROPOFOL;  Surgeon: Carol Ada, MD;  Location: [facility];  Service: Endoscopy;  Laterality: N/A;  . CORONARY STENT INTERVENTION N/A 01/26/2019   Procedure: CORONARY STENT INTERVENTION;  Surgeon: Adrian Prows, MD;  Location: [facility];  Service: Cardiovascular;  Laterality: N/A;  . LEFT HEART CATH AND CORS/GRAFTS ANGIOGRAPHY N/A 01/05/2019   Procedure: LEFT HEART CATH AND CORS/GRAFTS ANGIOGRAPHY;  Surgeon: Adrian Prows, MD;  Location: [facility];  Service: Cardiovascular;  Laterality: N/A;  . POLYPECTOMY  01/02/2018   Procedure: POLYPECTOMY;  Surgeon: Carol Ada, MD;  [facility];  Service: Endoscopy;;  . stents         Family History  Problem Relation Age of Onset  . Cancer Mother   . Heart attack Father     Social History   Tobacco Use  . Smoking status: Former Smoker    Packs/day: 1.00    Years: 5.00    Pack years: 5.00    Types: Cigarettes    Quit date: 2005    Years since quitting: 17.2  . Smokeless tobacco: Never Used  Vaping Use  . Vaping Use: Never used  Substance Use Topics  . Alcohol use: Not Currently  . Drug use: No    Home Medications Prior to Admission medications   Medication Sig Start Date End Date Taking? Authorizing Provider  amLODipine (NORVASC) 5 MG tablet  TAKE 1 TABLET(5 MG) BY MOUTH DAILY 06/30/20   Tolia, Sunit, DO  amoxicillin-clavulanate (AUGMENTIN) 875-125 MG tablet Take 1 tablet by mouth 2 (two) times daily. 08/30/20   [name], [name], NP  BD PEN NEEDLE NANO 2ND GEN 32G X 4 MM MISC USE 1 PEN THREE TIMES DAILY AS DIRECTED 07/03/20   [name], [name], NP  bisacodyl (DULCOLAX) 10 MG suppository Place 10 mg rectally daily as needed for moderate constipation.    [provider]  calamine lotion Apply 1 application topically as needed for itching.  04/07/20   [name], [name], NP  carbamide peroxide (DEBROX) 6.5 % OTIC solution Place 5 drops into both ears 2 (two) times daily. 09/08/20   [name], [name], NP  clopidogrel (PLAVIX) 75 MG tablet Take 1 tablet (75 mg total) by mouth daily. 03/23/20   Tolia, Sunit, DO  Dulaglutide (TRULICITY) 8.09 XI/3.3AS SOPN Administer 0.75mg  under the skin One time a Week. 03/14/20   [name], [name], NP  ezetimibe (ZETIA) 10 MG tablet TAKE 1 TABLET(10 MG) BY MOUTH DAILY 04/04/20   Tolia, Sunit, DO  glucose blood (ACCU-CHEK AVIVA PLUS) test strip Use to test blood sugar 3 times daily. Dx: E11.42 01/25/20   [name], [name], NP  HYDROcodone-acetaminophen (NORCO) 10-325 MG tablet Take 1 tablet by mouth 3 (three) times daily as needed. 09/07/20   [name], [name], NP  insulin detemir (LEVEMIR) 100 UNIT/ML FlexPen Inject 20 Units into the skin at bedtime. 06/26/20   [name], [name], NP  insulin lispro (HUMALOG KWIKPEN) 100 UNIT/ML KwikPen Inject 8-10 units into the skin per sliding scale three times daily with meals. 08/04/20   [name], [name], NP  isosorbide mononitrate (IMDUR) 60 MG 24 hr tablet Take 1 tablet (60 mg total) by mouth daily. 03/14/20   [name], [name], NP  losartan (COZAAR) 25 MG tablet TAKE 1/2 TABLET(12.5 MG) BY MOUTH DAILY 05/02/20   [name], [name], NP  metoprolol tartrate (LOPRESSOR) 25 MG tablet TAKE 1/2 TABLET(12.5 MG) BY MOUTH TWICE DAILY 03/14/20   [name], [name], NP  nitroGLYCERIN (NITROSTAT) 0.4 MG SL tablet Dissolve one tablet under tongue as needed for chest pain. 03/14/20   [name], [name], NP  REPATHA 140 MG/ML SOSY Inject 140 mg into the skin every 14 (fourteen) days. 03/23/20   Tolia, Sunit, DO  saccharomyces boulardii (FLORASTOR) 250 MG capsule Take 1 capsule (250 mg total) by mouth 2 (two) times daily. 08/30/20   [name], [name], NP  senna (SENOKOT) 8.6 MG TABS tablet Take 1 tablet (8.6 mg total) by mouth daily. 10/08/19   [name], [name], NP    Allergies    Patient has no known  allergies.  Review of Systems   Review of Systems  Constitutional:       Per HPI, otherwise negative  HENT:       Per HPI, otherwise negative  Respiratory:       Per HPI, otherwise negative  Cardiovascular:       Per HPI, otherwise negative  Gastrointestinal: Negative for vomiting.  Endocrine:       Negative aside from HPI  Genitourinary:       Neg aside from HPI   Musculoskeletal:       Per HPI, otherwise negative  Skin: Negative.   Neurological: Negative for syncope.    Physical Exam Updated Vital Signs BP 113/69 (BP Location: Right Arm)   Pulse 94   Resp 16   SpO2 100%  Physical Exam Vitals and nursing note reviewed.  Constitutional:      General: He is not in acute distress.    Appearance: He is well-developed.  HENT:     Head: Normocephalic and atraumatic.     Comments: Tongue edematous, no obvious lesions Eyes:     Conjunctiva/sclera: Conjunctivae normal.  Cardiovascular:     Rate and Rhythm: Normal rate and regular rhythm.  Pulmonary:     Effort: Pulmonary effort is normal. No respiratory distress.     Breath sounds: No stridor.  Abdominal:     General: There is no distension.  Skin:    General: Skin is warm and dry.  Neurological:     Mental Status: He is alert and oriented to person, place, and time.      ED Results / Procedures / Treatments   Labs (all labs ordered are listed, but only abnormal results are displayed) Labs Reviewed  CBC WITH DIFFERENTIAL/PLATELET - Abnormal; Notable for the following components:      Result Value   Abs Immature Granulocytes 0.08 (*)    All other components within normal limits  COMPREHENSIVE METABOLIC PANEL - Abnormal; Notable for the following components:   Sodium 134 (*)    Potassium 3.2 (*)    CO2 8 (*)    Glucose, Bld 167 (*)    BUN 111 (*)    Creatinine, Ser 4.05 (*)    Calcium 8.5 (*)    GFR, Estimated 15 (*)    Anion gap 16 (*)    All other components within normal limits  CBG MONITORING, ED -  Abnormal; Notable for the following components:   Glucose-Capillary 160 (*)    All other components within normal limits    EKG None  Radiology No results found.  Procedures Procedures   Medications Ordered in ED Medications  sodium chloride 0.9 % bolus 1,000 mL (1,000 mLs Intravenous New Bag/Given 09/11/20 1118)    ED Course  I have reviewed the triage vital signs and the nursing notes.  Pertinent labs & imaging results that were available during my care of the patient were reviewed by me and considered in my medical decision making (see chart for details).   Male with CKD, diabetes presents with dehydration.  Patient is awake and alert, has no focal complaints, has no substantial hemodynamic abnormalities.  He is minimally tachycardic on arrival.  However, the patient's initial labs notable for creatinine greater than 4, BUN 111, substantially up from baseline.  Potassium unremarkable.  Patient received fluid resuscitation on arrival, will require repeat evaluation.  3:35 PM After 2 L fluid resuscitation patient's creatinine is improved marginally, BUN remains similar.  He does clinically feel somewhat better.  However, given concern for acute kidney injury he will require admission for ongoing fluid resuscitation, monitoring, management. Other findings generally reassuring.  MDM Rules/Calculators/A&P MDM Number of Diagnoses or Management Options AKI (acute kidney injury) ([place]): new, needed workup Dehydration: new, needed workup   Amount and/or Complexity of Data Reviewed Clinical lab tests: reviewed and ordered Tests in the radiology section of CPT: reviewed and ordered Tests in the medicine section of CPT: ordered and reviewed Decide to obtain previous medical records or to obtain history from someone other than the patient: yes Discuss the patient with other providers: yes  Risk of Complications, Morbidity, and/or Mortality Presenting problems: high Diagnostic  procedures: high Management options: high  Critical Care Total time providing critical care: < 30 minutes  Patient Progress Patient progress: stable  Final Clinical Impression(s) / ED Diagnoses Final diagnoses:  Dehydration  AKI (acute kidney injury) ([place])     Carmin Muskrat, MD 09/11/20 1536

## 2020-09-11 NOTE — H&P (Signed)
History and Physical    David Burton YKD:[id number] DOB: Apr 13, 1946 DOA: 09/11/2020  Referring MD/NP/PA: Carmin Muskrat, MD PCP: Sandrea Hughs, NP  Patient coming from: home  Chief Complaint: Decreased oral intake  I have personally briefly reviewed patient's old medical records in [place]   HPI: David Burton is a 75 y.o. male with medical history significant of hypertension, hyperlipidemia, CVA, CAD, diabetes mellitus type 2, and CKD stage III presents with complaints of decreased oral intake over the last 4 to 5 days after recently having oral procedure.  Patient is in preparation for having all of his teeth removed scheduled for 4/13.  He had sustained significant cut to his tongue and reported significant mouth soreness for which he had not been eating and drinking.  Denies any fever, falls, chest pain, nausea, vomiting, abdominal pain, dysuria, or diarrhea.  He admits to taking a bottle of Advil 200 mg tablets over the last 2 weeks due to his symptoms although he admits that he was previously told not to take this medicine.  Patient reports last taking the medication 4 days ago after his daughter filled his prescription for oxycodone.  He complains of generalized weakness.  Patient notes that he does not have a nephrologist with whom he follows.  He was also given some kind of mouthwash, but reported that it burned his tongue.  Patient has been off of aspirin and Plavix for approximately 2 weeks now.  ED Course: Upon admission into the emergency department patient was noted to have fairly stable vital signs.  Labs significant for room 134, potassium 3.2, chloride 10, CO2 8, BUN 110, creatinine 4.05(creatinine previously 2.38 on 4/6).  Patient had been given 2 L of normal saline IV fluids.  TRH called to admit.  Review of Systems  Constitutional: Positive for malaise/fatigue. Negative for chills and fever.  HENT: Negative for ear pain and nosebleeds.        Positive for mouth pain   Eyes: Negative for pain and discharge.  Respiratory: Negative for cough and shortness of breath.   Cardiovascular: Negative for chest pain and leg swelling.  Gastrointestinal: Negative for abdominal pain, nausea and vomiting.  Genitourinary: Negative for dysuria and hematuria.  Musculoskeletal: Negative for falls and myalgias.  Neurological: Positive for weakness. Negative for focal weakness.  Psychiatric/Behavioral: The patient does not have insomnia.     Past Medical History:  Diagnosis Date  . Bell palsy   . CKD (chronic kidney disease), stage III ([place]) 12/16/2018  . Coronary artery disease involving native coronary artery of native heart with unstable angina pectoris ([place]) 12/16/2018  . GERD (gastroesophageal reflux disease)   . Hypertension   . Hypertension with heart disease 12/16/2018  . Myocardial infarction ([place])   . Pure hypercholesterolemia 12/16/2018  . Stroke ([place])   . Type 2 diabetes, controlled, with peripheral neuropathy ([place]) 12/16/2018    Past Surgical History:  Procedure Laterality Date  . CARDIAC SURGERY    . COLONOSCOPY WITH PROPOFOL N/A 01/02/2018   Procedure: COLONOSCOPY WITH PROPOFOL;  Surgeon: Carol Ada, MD;  [facility];  Service: Endoscopy;  Laterality: N/A;  . CORONARY STENT INTERVENTION N/A 01/26/2019   Procedure: CORONARY STENT INTERVENTION;  Surgeon: Adrian Prows, MD;  Location: [facility];  Service: Cardiovascular;  Laterality: N/A;  . LEFT HEART CATH AND CORS/GRAFTS ANGIOGRAPHY N/A 01/05/2019   Procedure: LEFT HEART CATH AND CORS/GRAFTS ANGIOGRAPHY;  Surgeon: Adrian Prows, MD;  Location: [facility];  Service: Cardiovascular;  Laterality: N/A;  . POLYPECTOMY  01/02/2018   Procedure: POLYPECTOMY;  Surgeon: Carol Ada, MD;  [facility];  Service: Endoscopy;;  . stents       reports that he quit smoking about 17 years ago. His smoking use included cigarettes. He has a 5.00 pack-year smoking history. He has never used  smokeless tobacco. He reports previous alcohol use. He reports that he does not use drugs.  No Known Allergies  Family History  Problem Relation Age of Onset  . Cancer Mother   . Heart attack Father     Prior to Admission medications   Medication Sig Start Date End Date Taking? Authorizing Provider  amLODipine (NORVASC) 5 MG tablet TAKE 1 TABLET(5 MG) BY MOUTH DAILY 06/30/20   Tolia, Sunit, DO  amoxicillin-clavulanate (AUGMENTIN) 875-125 MG tablet Take 1 tablet by mouth 2 (two) times daily. 08/30/20   [name], [name], NP  BD PEN NEEDLE NANO 2ND GEN 32G X 4 MM MISC USE 1 PEN THREE TIMES DAILY AS DIRECTED 07/03/20   [name], [name], NP  bisacodyl (DULCOLAX) 10 MG suppository Place 10 mg rectally daily as needed for moderate constipation.    [provider]  calamine lotion Apply 1 application topically as needed for itching. 04/07/20   [name], [name], NP  carbamide peroxide (DEBROX) 6.5 % OTIC solution Place 5 drops into both ears 2 (two) times daily. 09/08/20   [name], [name], NP  clopidogrel (PLAVIX) 75 MG tablet Take 1 tablet (75 mg total) by mouth daily. 03/23/20   Tolia, Sunit, DO  Dulaglutide (TRULICITY) 5.80 DX/8.3JA SOPN Administer 0.75mg  under the skin One time a Week. 03/14/20   [name], [name], NP  ezetimibe (ZETIA) 10 MG tablet TAKE 1 TABLET(10 MG) BY MOUTH DAILY 04/04/20   Tolia, Sunit, DO  glucose blood (ACCU-CHEK AVIVA PLUS) test strip Use to test blood sugar 3 times daily. Dx: E11.42 01/25/20   [name], [name], NP  HYDROcodone-acetaminophen (NORCO) 10-325 MG tablet Take 1 tablet by mouth 3 (three) times daily as needed. 09/07/20   [name], [name], NP  insulin detemir (LEVEMIR) 100 UNIT/ML FlexPen Inject 20 Units into the skin at bedtime. 06/26/20   [name], [name], NP  insulin lispro (HUMALOG KWIKPEN) 100 UNIT/ML KwikPen Inject 8-10 units into the skin per sliding scale three times daily with meals. 08/04/20   [name], [name], NP  isosorbide mononitrate (IMDUR) 60 MG 24  hr tablet Take 1 tablet (60 mg total) by mouth daily. 03/14/20   [name], [name], NP  losartan (COZAAR) 25 MG tablet TAKE 1/2 TABLET(12.5 MG) BY MOUTH DAILY 05/02/20   [name], [name], NP  metoprolol tartrate (LOPRESSOR) 25 MG tablet TAKE 1/2 TABLET(12.5 MG) BY MOUTH TWICE DAILY 03/14/20   [name], [name], NP  nitroGLYCERIN (NITROSTAT) 0.4 MG SL tablet Dissolve one tablet under tongue as needed for chest pain. 03/14/20   [name], [name], NP  REPATHA 140 MG/ML SOSY Inject 140 mg into the skin every 14 (fourteen) days. 03/23/20   Tolia, Sunit, DO  saccharomyces boulardii (FLORASTOR) 250 MG capsule Take 1 capsule (250 mg total) by mouth 2 (two) times daily. 08/30/20   [name], [name], NP  senna (SENOKOT) 8.6 MG TABS tablet Take 1 tablet (8.6 mg total) by mouth daily. 10/08/19   [name], [name], NP    Physical Exam:  Constitutional: Elderly male currently in no acute distress Vitals:   09/11/20 1016 09/11/20 1331  BP: 113/69 (!) 148/99  Pulse:  94 92  Resp: 16 16  SpO2: 100% 100%   Eyes: PERRL, lids and conjunctivae normal ENMT: Mucous membranes are dry. Posterior pharynx clear of any exudate or lesions.poor dentition with multiple missing teeth Neck: normal, supple, no masses, no thyromegaly Respiratory: clear to auscultation bilaterally, no wheezing, no crackles. Normal respiratory effort. No accessory muscle use.  Cardiovascular: Regular rate and rhythm, no murmurs / rubs / gallops. No extremity edema. 2+ pedal pulses. No carotid bruits.  Abdomen: no tenderness, no masses palpated. No hepatosplenomegaly. Bowel sounds positive.  Musculoskeletal: no clubbing / cyanosis. No joint deformity upper and lower extremities. Good ROM, no contractures. Normal muscle tone.  Skin: no rashes, lesions, ulcers. No induration Neurologic: CN 2-12 grossly intact. Sensation intact, DTR normal. Strength 5/5 in all 4.  Psychiatric: Normal judgment and insight. Alert and oriented x 3. Normal mood.     Labs  on Admission: I have personally reviewed following labs and imaging studies  CBC: Recent Labs  Lab 09/06/20 0900 09/11/20 1019 09/11/20 1355  WBC 10.0 10.5  --   NEUTROABS 6,470 6.6  --   HGB 15.9 16.5 13.9  HCT 47.1 46.9 41.0  MCV 91.8 87.8  --   PLT 214 178  --    Basic Metabolic Panel: Recent Labs  Lab 09/06/20 0900 09/11/20 1019 09/11/20 1355  NA 136 134* 137  K 4.2 3.2* 3.9  CL 109 110 114*  CO2 12* 8*  --   GLUCOSE 142* 167* 157*  BUN 38* 111* 112*  CREATININE 2.38* 4.05* 3.50*  CALCIUM 9.7 8.5*  --    GFR: Estimated Creatinine Clearance: 22 mL/min (A) (by C-G formula based on SCr of 3.5 mg/dL (H)). Liver Function Tests: Recent Labs  Lab 09/06/20 0900 09/11/20 1019  AST 18 17  ALT 16 16  ALKPHOS  --  75  BILITOT 0.3 0.9  PROT 8.0 8.0  ALBUMIN  --  3.8   No results for input(s): LIPASE, AMYLASE in the last 168 hours. No results for input(s): AMMONIA in the last 168 hours. Coagulation Profile: No results for input(s): INR, PROTIME in the last 168 hours. Cardiac Enzymes: No results for input(s): CKTOTAL, CKMB, CKMBINDEX, TROPONINI in the last 168 hours. BNP (last 3 results) No results for input(s): PROBNP in the last 8760 hours. HbA1C: No results for input(s): HGBA1C in the last 72 hours. CBG: Recent Labs  Lab 09/11/20 1017  GLUCAP 160*   Lipid Profile: No results for input(s): CHOL, HDL, LDLCALC, TRIG, CHOLHDL, LDLDIRECT in the last 72 hours. Thyroid Function Tests: No results for input(s): TSH, T4TOTAL, FREET4, T3FREE, THYROIDAB in the last 72 hours. Anemia Panel: No results for input(s): VITAMINB12, FOLATE, FERRITIN, TIBC, IRON, RETICCTPCT in the last 72 hours. Urine analysis:    Component Value Date/Time   COLORURINE YELLOW 09/17/2019 0204   APPEARANCEUR HAZY (A) 09/17/2019 0204   LABSPEC 1.017 09/17/2019 0204   PHURINE 5.0 09/17/2019 0204   GLUCOSEU 150 (A) 09/17/2019 0204   HGBUR NEGATIVE 09/17/2019 0204   BILIRUBINUR NEGATIVE  09/17/2019 0204   KETONESUR NEGATIVE 09/17/2019 0204   PROTEINUR NEGATIVE 09/17/2019 0204   UROBILINOGEN 1.0 02/18/2015 1702   NITRITE NEGATIVE 09/17/2019 0204   LEUKOCYTESUR MODERATE (A) 09/17/2019 0204   Sepsis Labs: No results found for this or any previous visit (from the past 240 hour(s)).   Radiological Exams on Admission: No results found.   Assessment/Plan Acute renal failure superimposed on chronic kidney disease stage IIIb: Patient presents with reports of decreased  p.o. intake.  His creatinine previously have been around 2, but presents with creatinine 4.05 with BUN 110.  The elevated BUN to creatinine ratio suggests prerenal cause of symptoms.  Patient has been given 2 L normal saline IV fluids. -Admit to medical telemetry bed -Check urinalysis -Hold nephrotoxic agents -Sodium bicarb 150 mEq in sterile water and 100 mL/h -Continue to monitor kidney function daily -Consider formally consulted nephrology if kidney function does not appear to improve with therapies as seen  Metabolic acidosis with elevated anion gap: Acute.  On admission CO2 8 anion gap 16.  Suspect secondary to renal failure with elevated BUN.  However, patient also reported use of Advil. -Check salicylate level  -Continue to monitor  Weakness: Patient lives alone at baseline.  Denies any reports of any recent falls. -PT/OT to eval and treat in a.m.  Mouth pain: Acute.  Patient reports continued mouth soreness.  He is scheduled to have extraction of the remaining teeth on 4/13.  He was placed on Augmentin prophylaxis prior to surgery -Magic mouthwash with lidocaine -Continue hydrocodone as needed for pain and Augmentin  Essential hypertension: On admission blood pressures 113/69-148/99.  Blood pressure medications include Norvasc 5 mg daily, losartan 12.5 mg daily, and isosorbide mononitrate 60 mg daily. -Hold losartan -Continue metoprolol and Norvasc  Coronary artery disease -Holding aspirin and  Plavix  Diabetes mellitus type 2, uncontrolled: Last hemoglobin A1c 8.1 on 09/06/2020.  Home regimen includes Levemir 20 units nightly, Trulicity 0.5 mg subcu Monday, and Humalog 8 -10 units per sliding scale with meals. -Hypoglycemic protocol -Held home insulin regimen -CBGs before every meal and nightly with moderate SSI -Adjust insulin regimen as needed once patient able to tolerate eating.  DNR: Present on admission  DVT prophylaxis: Heparin Code Status: DNR Family Communication: Daughter updated at bedside Disposition Plan: Hopefully discharge home once medically Consults called: Discussed case with Dr. Augustin Coupe over the phone, but nephrology was not formally consulted Admission status: Inpatient require more than 2 midnight stay  Norval Morton MD [facility]   If 7PM-7AM, please contact night-coverage   09/11/2020, 2:19 PM

## 2020-09-12 ENCOUNTER — Inpatient Hospital Stay (HOSPITAL_COMMUNITY): Payer: Medicare Other

## 2020-09-12 LAB — BASIC METABOLIC PANEL
Anion gap: 10 (ref 5–15)
BUN: 93 mg/dL — ABNORMAL HIGH (ref 8–23)
CO2: 13 mmol/L — ABNORMAL LOW (ref 22–32)
Calcium: 7.9 mg/dL — ABNORMAL LOW (ref 8.9–10.3)
Chloride: 110 mmol/L (ref 98–111)
Creatinine, Ser: 2.7 mg/dL — ABNORMAL HIGH (ref 0.61–1.24)
GFR, Estimated: 24 mL/min — ABNORMAL LOW (ref >60)
Glucose, Bld: 130 mg/dL — ABNORMAL HIGH (ref 70–99)
Potassium: 2.5 mmol/L — CL (ref 3.5–5.1)
Sodium: 133 mmol/L — ABNORMAL LOW (ref 135–145)

## 2020-09-12 LAB — GLUCOSE, CAPILLARY
Glucose-Capillary: 132 mg/dL — ABNORMAL HIGH (ref 70–99)
Glucose-Capillary: 150 mg/dL — ABNORMAL HIGH (ref 70–99)
Glucose-Capillary: 176 mg/dL — ABNORMAL HIGH (ref 70–99)
Glucose-Capillary: 236 mg/dL — ABNORMAL HIGH (ref 70–99)

## 2020-09-12 LAB — CBC
HCT: 35.3 % — ABNORMAL LOW (ref 39.0–52.0)
Hemoglobin: 13 g/dL (ref 13.0–17.0)
MCH: 31.4 pg (ref 26.0–34.0)
MCHC: 36.8 g/dL — ABNORMAL HIGH (ref 30.0–36.0)
MCV: 85.3 fL (ref 80.0–100.0)
Platelets: 155 10*3/uL (ref 150–400)
RBC: 4.14 MIL/uL — ABNORMAL LOW (ref 4.22–5.81)
RDW: 14.4 % (ref 11.5–15.5)
WBC: 8.3 10*3/uL (ref 4.0–10.5)
nRBC: 0 % (ref 0.0–0.2)

## 2020-09-12 LAB — MAGNESIUM: Magnesium: 2.3 mg/dL (ref 1.7–2.4)

## 2020-09-12 LAB — SALICYLATE LEVEL: Salicylate Lvl: <7 mg/dL — ABNORMAL LOW (ref 7.0–30.0)

## 2020-09-12 MED ORDER — SODIUM CHLORIDE 0.9 % IV SOLN: INTRAVENOUS | Status: DC

## 2020-09-12 MED ORDER — POTASSIUM CHLORIDE 10 MEQ/100ML IV SOLN
10.0000 meq | INTRAVENOUS | Status: AC
  Administered 2020-09-12 (×6): 10 meq via INTRAVENOUS
  Filled 2020-09-12 (×6): qty 100

## 2020-09-12 MED ORDER — PHENOL 1.4 % MT LIQD
1.0000 | OROMUCOSAL | Status: DC | PRN
  Administered 2020-09-12: 1 via OROMUCOSAL
  Filled 2020-09-12: qty 177

## 2020-09-12 MED ORDER — POTASSIUM CHLORIDE CRYS ER 20 MEQ PO TBCR
40.0000 meq | EXTENDED_RELEASE_TABLET | Freq: Once | ORAL | Status: AC
  Administered 2020-09-12: 40 meq via ORAL
  Filled 2020-09-12: qty 2

## 2020-09-12 NOTE — TOC Initial Note (Signed)
Transition of Care [facility]) - Initial/Assessment Note    Patient Details  Name: David Burton MRN: [id number] Date of Birth: 02/26/1946  [facility]) CM/SW Contact:    Marilu Favre, RN Phone Number: 09/12/2020, 2:20 PM  Clinical Narrative:                  Spoke to patient at bedside and aughter Terri via phone . Discussed PT recommendation for HHPT and supervision. Explained to Mina aide would not be able to sit with patient when family not available. Daughter does not feel he needs supervision and would like to talk directly to PT. NCM secure chatted PT  Expected Discharge Plan: [place] Barriers to Discharge: Continued Medical Work up   Patient Goals and CMS Choice Patient states their goals for this hospitalization and ongoing recovery are:: to return to home CMS Medicare.gov Compare Post Acute Care list provided to:: Patient Choice offered to / list presented to : [place]  Expected Discharge Plan and Services Expected Discharge Plan: [place] Acute Care Choice: [place] arrangements for the past 2 months: Single Family Home                 DME Arranged: N/A DME Agency: NA       HH Arranged: RN,PT,Nurse's Aide HH Agency: [place] Date [facility] Agency Contacted: 09/12/20 Time HH Agency Contacted: 28 Representative spoke with at [place]: Tommi Rumps  Prior Living Arrangements/Services Living arrangements for the past 2 months: [place] with:: Self Patient language and need for interpreter reviewed:: Yes Do you feel safe going back to the place where you live?: Yes      Need for Family Participation in Patient Care: Yes (Comment) Care giver support system in place?: Yes (comment) Current home services: DME Criminal Activity/Legal Involvement Pertinent to Current Situation/Hospitalization: No - Comment as needed  Activities of Daily Living   ADL  Screening (condition at time of admission) Patient's cognitive ability adequate to safely complete daily activities?: Yes Is the patient deaf or have difficulty hearing?: Yes Does the patient have difficulty seeing, even when wearing glasses/contacts?: No Does the patient have difficulty concentrating, remembering, or making decisions?: No Patient able to express need for assistance with ADLs?: Yes Does the patient have difficulty dressing or bathing?: No Independently performs ADLs?: Yes (appropriate for developmental age) Does the patient have difficulty walking or climbing stairs?: No Weakness of Legs: None Weakness of Arms/Hands: None  Permission Sought/Granted   Permission granted to share information with : Yes, Verbal Permission Granted  Share Information with NAME: [name] daughter           Emotional Assessment Appearance:: Appears stated age Attitude/Demeanor/Rapport: Engaged Affect (typically observed): Accepting Orientation: : Oriented to Self,Oriented to Place,Oriented to  Time,Oriented to Situation Alcohol / Substance Use: Not Applicable Psych Involvement: No (comment)  Admission diagnosis:  Dehydration [E86.0] AKI (acute kidney injury) ([place]) [N17.9] Acute renal failure superimposed on chronic kidney disease ([place]) [N17.9, N18.9] Patient Active Problem List   Diagnosis Date Noted  . Acute renal failure superimposed on chronic kidney disease ([place]) 09/11/2020  . Metabolic acidosis 26/71/2458  . Mouth pain 09/11/2020  . DNR (do not resuscitate) 09/11/2020  . Alcohol use 01/30/2020  . History of stroke 01/30/2020  . Hyponatremia 01/30/2020  . GERD (gastroesophageal reflux disease)   . Diastolic dysfunction 09/98/3382  . Neurocognitive deficits 09/23/2019  .  Elbow swelling, left   . Generalized weakness   . Right pontine stroke ([place]) 09/16/2019  . Essential hypertension 12/16/2018  . CAD (coronary artery disease), native coronary artery 12/16/2018  . CKD (chronic  kidney disease), stage III ([place]) 12/16/2018  . Pure hypercholesterolemia 12/16/2018  . Type 2 diabetes, controlled, with peripheral neuropathy ([place]) 12/16/2018  . Lower abdominal pain 05/24/2015   PCP:  Sandrea Hughs, NP Pharmacy:   [facility] [place], [address] [place] [address] Phone: [phone] Fax: [phone]  [place], [place] [address] [place] [address] Phone: [phone] Fax: [phone]  [place], [place] [address] [place] [address] Phone: [phone] Fax: [facility] [place], [place] [address] Phone: [phone] Fax: [phone]     Social Determinants of Health (SDOH) Interventions    Readmission Risk Interventions No flowsheet data found.

## 2020-09-12 NOTE — Evaluation (Signed)
Physical Therapy Evaluation Patient Details Name: David Burton MRN: [id number] DOB: 09-May-1946 Today's Date: 09/12/2020   History of Present Illness  David Burton is a 75 y/o male who reported to ED due to a tongue injury following oral surgery. Pt admitted on 09/11/20 with acute renal failure. PMH includes DM, prior CVA (July 2020), HTN, CKD, MI, Bell's Palsy, and CAD    Clinical Impression  Pt received in bed with above diagnosis. Generally min A - min guard for mobility. Pt wanted to try RW due to feeling weak but had difficulty sequencing with RW especially in small areas. Pt able to follow commands consistently with increased time but unaware of surroundings and somewhat disoriented. Pt would benefit from PT to improve balance, decrease risk for falls, and increase independency. Left in chair with all needs met, call bell within reach, chair alarm active and RN aware of status. Will continue to follow acutely. Plan to trial no AD. Family in process of acquiring home health aid to help provide assistance.   Follow Up Recommendations Home health PT;Supervision for mobility/OOB;Other (comment) (Recommend home health aide for when family unavailable to assist)    Equipment Recommendations  None recommended by PT    Recommendations for Other Services       Precautions / Restrictions Precautions Precautions: Fall Restrictions Weight Bearing Restrictions: No      Mobility  Bed Mobility Overal bed mobility: Needs Assistance Bed Mobility: Supine to Sit     Supine to sit: HOB elevated;Min assist     General bed mobility comments: Use of bedrail, difficulty initiating task, min A to initiate LE movement but afterwards pt able to complete supine to sit at min guard level    Transfers Overall transfer level: Needs assistance Equipment used: Rolling walker (2 wheeled) Transfers: Sit to/from Stand Sit to Stand: Min guard         General transfer comment: min guard for safety, pt reaching  for RW upon standing stating that he felt weak even though pt states not using RW at baseline  Ambulation/Gait Ambulation/Gait assistance: Min guard Gait Distance (Feet): 90 Feet Assistive device: Rolling walker (2 wheeled) Gait Pattern/deviations: Step-through pattern;Narrow base of support;Decreased stride length;Drifts right/left Gait velocity: decreased   General Gait Details: Cueing for safe navigation with RW, no overt LOB  Stairs            Wheelchair Mobility    Modified Rankin (Stroke Patients Only)       Balance Overall balance assessment: Needs assistance   Sitting balance-Leahy Scale: Fair       Standing balance-Leahy Scale: Fair                               Pertinent Vitals/Pain Pain Assessment: Faces Faces Pain Scale: Hurts a little bit Pain Location: mouth Pain Descriptors / Indicators: Aching Pain Intervention(s): Monitored during session;Repositioned    Home Living Family/patient expects to be discharged to:: Private residence Living Arrangements: Alone Available Help at Discharge: Family;Available PRN/intermittently Type of Home: Apartment Home Access: Elevator     Home Layout: One level Home Equipment: Walker - 2 wheels;Cane - single point;Shower seat      Prior Function Level of Independence: Independent         Comments: Reports completing ADLs independently. Retired Administrator. Does not ambulate with AD. Denies any falls within last 6 months. Children available to assist PRN.     Hand Dominance  Dominant Hand: Right    Extremity/Trunk Assessment   Upper Extremity Assessment Upper Extremity Assessment: Defer to OT evaluation    Lower Extremity Assessment Lower Extremity Assessment: Overall WFL for tasks assessed       Communication   Communication: No difficulties  Cognition Arousal/Alertness: Awake/alert Behavior During Therapy: WFL for tasks assessed/performed Overall Cognitive Status:  Impaired/Different from baseline Area of Impairment: Orientation;Problem solving                 Orientation Level: Disoriented to;Time           Problem Solving: Slow processing;Decreased initiation;Requires tactile cues;Difficulty sequencing General Comments: Stated month was either March or May. When asked which month follows March, pt stated "i don't know, may?" Stated year was 60. Pt unaware he was lying in soaked linens and his shirt weas completely wet      General Comments      Exercises     Assessment/Plan    PT Assessment Patient needs continued PT services  PT Problem List Decreased strength;Decreased mobility;Decreased safety awareness;Decreased activity tolerance;Decreased balance;Decreased knowledge of use of DME;Decreased cognition       PT Treatment Interventions DME instruction;Therapeutic exercise;Gait training;Balance training;Functional mobility training;Therapeutic activities;Neuromuscular re-education;Patient/family education    PT Goals (Current goals can be found in the Care Plan section)  Acute Rehab PT Goals Patient Stated Goal: to get my mouth better PT Goal Formulation: With patient Time For Goal Achievement: 09/26/20 Potential to Achieve Goals: Good    Frequency Min 3X/week   Barriers to discharge        Co-evaluation               AM-PAC PT "6 Clicks" Mobility  Outcome Measure Help needed turning from your back to your side while in a flat bed without using bedrails?: A Little Help needed moving from lying on your back to sitting on the side of a flat bed without using bedrails?: A Little Help needed moving to and from a bed to a chair (including a wheelchair)?: A Little Help needed standing up from a chair using your arms (e.g., wheelchair or bedside chair)?: A Little Help needed to walk in hospital room?: A Little Help needed climbing 3-5 steps with a railing? : A Little 6 Click Score: 18    End of Session Equipment  Utilized During Treatment: Gait belt Activity Tolerance: Patient tolerated treatment well Patient left: in chair;with call bell/phone within reach;with chair alarm set Nurse Communication: Mobility status PT Visit Diagnosis: Unsteadiness on feet (R26.81);Other abnormalities of gait and mobility (R26.89)    Time:  -      Charges:         Rosita Kea, SPT

## 2020-09-12 NOTE — Progress Notes (Signed)
Pt returned from ultrasound

## 2020-09-12 NOTE — Evaluation (Signed)
Occupational Therapy Evaluation Patient Details Name: David Burton MRN: [id number] DOB: [birth year] Today's Date: 09/12/2020    History of Present Illness David Burton is a 75 y/o male who reported to ED due to a tongue injury following oral surgery. Pt admitted on 09/11/20 with acute renal failure. PMH includes DM, prior CVA (July 2020), HTN, CKD, MI, Bell's Palsy, and CAD   Clinical Impression   Pt PTA: pt living alone, supportive family that assists on the weekends. Pt currently, pt near functional ADL baseline. Pt currently sitting for OOB ADL tasks at sink (at baseline too). Pt ambulatory in room with no AD. Pt performing toilet hygiene prior to OTR arrival- missing urinal and cues to clean up area. Pt does not require continued OT skilled services. OT appears at functional baseline for ADL and ADL functional mobility. Pt to have home health aid to assist with bath/dress. OT signing off.      Follow Up Recommendations  No OT follow up;Supervision - Intermittent    Equipment Recommendations  3 in 1 bedside commode    Recommendations for Other Services       Precautions / Restrictions Precautions Precautions: Fall Restrictions Weight Bearing Restrictions: No      Mobility Bed Mobility Overal bed mobility: Needs Assistance Bed Mobility: Supine to Sit;Sit to Supine     Supine to sit: Supervision Sit to supine: Supervision   General bed mobility comments: +bedrail use    Transfers Overall transfer level: Needs assistance Equipment used: None Transfers: Sit to/from Stand Sit to Stand: Supervision         General transfer comment: SupervisionA for mobility in room with no AD    Balance Overall balance assessment: Needs assistance   Sitting balance-Leahy Scale: Fair     Standing balance support: Single extremity supported Standing balance-Leahy Scale: Fair Standing balance comment: no physical assist required                           ADL either performed  or assessed with clinical judgement   ADL Overall ADL's : At baseline Eating/Feeding: Modified independent;Sitting   Grooming: Modified independent;Sitting;Wash/dry hands;Wash/dry face Grooming Details (indicate cue type and reason): pt reports to sit for grooming Upper Body Bathing: Modified independent;Sitting   Lower Body Bathing: Supervison/ safety;Cueing for safety;Sitting/lateral leans;Sit to/from stand   Upper Body Dressing : Modified independent;Sitting;Cueing for safety   Lower Body Dressing: Supervision/safety;Cueing for safety;Sitting/lateral leans;Sit to/from stand   Toilet Transfer: Modified Independent;Ambulation;Regular Toilet   Toileting- Clothing Manipulation and Hygiene: Supervision/safety;Sitting/lateral lean;Sit to/from stand       Functional mobility during ADLs: Supervision/safety;Cueing for safety General ADL Comments: Pt near functional ADL baseline. Pt currently sitting for OOB ADL tasks at sink (at baseline too). Pt ambulatory in room with no AD. Pt performing toilet hygiene prior to OTR arrival- missing urinal and cues to clean up area. Pt performing mobility with no physical assist.     Vision Baseline Vision/History: No visual deficits Patient Visual Report: No change from baseline Vision Assessment?: No apparent visual deficits     Perception     Praxis      Pertinent Vitals/Pain Pain Assessment: Faces Faces Pain Scale: Hurts a little bit Pain Location: mouth Pain Descriptors / Indicators: Aching Pain Intervention(s): Monitored during session     Hand Dominance Right   Extremity/Trunk Assessment Upper Extremity Assessment Upper Extremity Assessment: Overall WFL for tasks assessed   Lower Extremity Assessment Lower Extremity Assessment: Overall WFL for  tasks assessed   Cervical / Trunk Assessment Cervical / Trunk Assessment: Normal   Communication Communication Communication: No difficulties;Other (comment) (Bell's palsy at  baseline)   Cognition Arousal/Alertness: Awake/alert Behavior During Therapy: WFL for tasks assessed/performed Overall Cognitive Status: Within Functional Limits for tasks assessed                                 General Comments: Pt A/O x4; following all commands and sequencing through grooming at sink   General Comments  VSS on RA.    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 Denies any falls within last 6 months. Children available to assist PRN.        OT Problem List: Decreased strength      OT Treatment/Interventions:      OT Goals(Current goals can be found in the care plan section) Acute Rehab OT Goals Patient Stated Goal: to get my mouth better OT Goal Formulation: All assessment and education complete, DC therapy Potential to Achieve Goals: Good  OT Frequency:     Barriers to D/C:            Co-evaluation              AM-PAC OT "6 Clicks" Daily Activity     Outcome Measure Help from another person eating meals?: None Help from another person taking care of personal grooming?: None Help from another person toileting, which includes using toliet, bedpan, or urinal?: A Little Help from another person bathing (including washing, rinsing, drying)?: A Little Help from another person to put on and taking off regular upper body clothing?: None Help from another person to put on  and taking off regular lower body clothing?: None 6 Click Score: 22   End of Session Nurse Communication: Mobility status  Activity Tolerance: Patient tolerated treatment well Patient left: in bed;with call bell/phone within reach;with bed alarm set  OT Visit Diagnosis: Unsteadiness on feet (R26.81);Muscle weakness (generalized) (M62.81)                Time: 9774-1423 OT Time Calculation (min): 18 min Charges:  OT General Charges $OT Visit: 1 Visit OT Evaluation $OT Eval Moderate Complexity: 1 Mod  Jefferey Pica, OTR/L Acute Rehabilitation Services Pager: [phone] Office: [phone]   Kentarius Partington C 09/12/2020, 3:32 PM

## 2020-09-12 NOTE — Progress Notes (Signed)
Pt transported to ultrasound dept

## 2020-09-12 NOTE — Progress Notes (Signed)
PROGRESS NOTE    David Burton  KZL:[id number] DOB: [birth year] DOA: 09/11/2020 PCP: Sandrea Hughs, NP    Brief Narrative:  David Burton was admitted to the hospital with a working diagnosis of acute kidney injury on chronic kidney disease stage IIIb.  75 year old male past medical history for hypertension, dyslipidemia, coronary artery disease, CVA, type 2 diabetes mellitus and chronic kidney disease stage IIIb who presented with decreased p.o. intake.  Patient sustained a tongue injury after a dental procedure last week, with significant decrease in his p.o. intake for the last 4 days. He has been taking ibuprofen 200 mg tablets over the last 3 weeks for tooth ache, plan for tooth extraction this week. His symptoms have been associated with generalized weakness but no fever or chills.  On his initial physical examination blood pressure 113/69, heart rate 94, respiratory rate 16, oxygen saturation 100%, his lungs are clear to auscultation bilaterally, heart S1-S2, present rhythm, soft abdomen, no lower extremity edema.  Sodium 134, potassium 3.2, chloride 110, bicarb 8, glucose 167, BUN 111, creatinine 4.0, anion gap 16.  White count 10.5, hemoglobin 16.5, hematocrit 46.9, platelets 178.   SARS COVID-19 negative.  Patient was placed on IV bicarbonate for hydration with improvement in renal function.   Assessment & Plan:   Principal Problem:   Acute renal failure superimposed on chronic kidney disease (HCC) Active Problems:   Essential hypertension   CAD (coronary artery disease), native coronary artery   Type 2 diabetes, controlled, with peripheral neuropathy (HCC)   Generalized weakness   Metabolic acidosis   Mouth pain   DNR (do not resuscitate)   1. AKI on CKD stage 3b/ hypokalemia/ anion gap metabolic acidosis.  Renal function with serum cr down to 2,70, K is 2,5 and serum bicarbonate 13, Na 133 and Cl 110. Anion gap today is 10.  His po intake has improved, continue hydration  with isotonic saline at 50 ml per hr and follow up on renal function in am. Avoid hypotension and nephrotoxic medications.   Continue K correction with KCl. Had 60 meq this am, will add 40 meq this pm. Mg is 2,3.   Further work up with renal US.   2. HTN Continue blood pressure monitoring, continue metoprolol and amlodipine, hold on losartan for now, until more stable renal function.   3. T2DM uncontrolled with hyperglycemia. HbgA1c 8.1 dyslipidemia fasting glucose this am is 130, po intake now improving  Continue glucose cover and monitoring with insulin sliding scale.   On ezetimibe.   4. CAD Patient chest pain free. Continue with dual antiplatelet therapy with aspirin and clopidogrel. Continue with isosorbide.   5. Dental/ toungue injury. Po intake has improved, continue pain control with oxycodone. Follow with dental surgery as outpatient.  Continue with Augmentin for now now.   Patient continue to be at high risk for worsening renal function   Status is: Inpatient  Remains inpatient appropriate because:IV treatments appropriate due to intensity of illness or inability to take PO   Dispo: The patient is from: Home              Anticipated d/c is to: Home              Patient currently is not medically stable to d/c.   Difficult to place patient No   DVT prophylaxis: Heparin    Code Status:   DNR   Family Communication:  No family at the bedside      Subjective: Patient is  feeling better, po intake has improved, but not yet back to baseline, no nausea or vomiting.   Objective: Vitals:   09/11/20 2330 09/12/20 0400 09/12/20 0805 09/12/20 1140  BP: 100/61 130/81 139/81 106/65  Pulse: 83 81 84 75  Resp: 18 18  18   Temp: 98.5 F (36.9 C) 98.3 F (36.8 C)  97.6 F (36.4 C)  TempSrc: Oral Oral  Oral  SpO2: 98% 100%  100%  Weight:      Height:        Intake/Output Summary (Last 24 hours) at 09/12/2020 1248 Last data filed at 09/12/2020 1202 Gross per 24 hour   Intake 82.06 ml  Output 550 ml  Net -467.94 ml   Filed Weights   09/11/20 1756  Weight: 108.5 kg    Examination:   General: Not in pain or dyspnea, deconditioned  Neurology: Awake and alert, non focal  E ENT: mild pallor, no icterus, oral mucosa moist, poor dentition.  Cardiovascular: No JVD. S1-S2 present, rhythmic, no gallops, rubs, or murmurs. No lower extremity edema. Pulmonary: positive breath sounds bilaterally, with no wheezing, rhonchi or rales. Gastrointestinal. Abdomen soft and non tender Skin. No rashes Musculoskeletal: no joint deformities     Data Reviewed: I have personally reviewed following labs and imaging studies  CBC: Recent Labs  Lab 09/06/20 0900 09/11/20 1019 09/11/20 1355 09/12/20 0203  WBC 10.0 10.5  --  8.3  NEUTROABS 6,470 6.6  --   --   HGB 15.9 16.5 13.9 13.0  HCT 47.1 46.9 41.0 35.3*  MCV 91.8 87.8  --  85.3  PLT 214 178  --  284   Basic Metabolic Panel: Recent Labs  Lab 09/06/20 0900 09/11/20 1019 09/11/20 1355 09/12/20 0203  NA 136 134* 137 133*  K 4.2 3.2* 3.9 2.5*  CL 109 110 114* 110  CO2 12* 8*  --  13*  GLUCOSE 142* 167* 157* 130*  BUN 38* 111* 112* 93*  CREATININE 2.38* 4.05* 3.50* 2.70*  CALCIUM 9.7 8.5*  --  7.9*  MG  --   --   --  2.3   GFR: Estimated Creatinine Clearance: 28.6 mL/min (A) (by C-G formula based on SCr of 2.7 mg/dL (H)). Liver Function Tests: Recent Labs  Lab 09/06/20 0900 09/11/20 1019  AST 18 17  ALT 16 16  ALKPHOS  --  75  BILITOT 0.3 0.9  PROT 8.0 8.0  ALBUMIN  --  3.8   No results for input(s): LIPASE, AMYLASE in the last 168 hours. No results for input(s): AMMONIA in the last 168 hours. Coagulation Profile: No results for input(s): INR, PROTIME in the last 168 hours. Cardiac Enzymes: No results for input(s): CKTOTAL, CKMB, CKMBINDEX, TROPONINI in the last 168 hours. BNP (last 3 results) No results for input(s): PROBNP in the last 8760 hours. HbA1C: No results for input(s):  HGBA1C in the last 72 hours. CBG: Recent Labs  Lab 09/11/20 1017 09/11/20 2107 09/12/20 0604 09/12/20 1055  GLUCAP 160* 180* 132* 150*   Lipid Profile: No results for input(s): CHOL, HDL, LDLCALC, TRIG, CHOLHDL, LDLDIRECT in the last 72 hours. Thyroid Function Tests: No results for input(s): TSH, T4TOTAL, FREET4, T3FREE, THYROIDAB in the last 72 hours. Anemia Panel: No results for input(s): VITAMINB12, FOLATE, FERRITIN, TIBC, IRON, RETICCTPCT in the last 72 hours.    Radiology Studies: I have reviewed all of the imaging during this hospital visit personally     Scheduled Meds: . amLODipine  5 mg Oral Daily  .  amoxicillin-clavulanate  1 tablet Oral BID  . ezetimibe  10 mg Oral Daily  . heparin  5,000 Units Subcutaneous Q8H  . insulin aspart  0-15 Units Subcutaneous TID WC  . insulin aspart  0-5 Units Subcutaneous QHS  . isosorbide mononitrate  60 mg Oral Daily  . metoprolol tartrate  12.5 mg Oral BID  . saccharomyces boulardii  250 mg Oral BID  . sodium chloride flush  3 mL Intravenous Q12H   Continuous Infusions: .  sodium bicarbonate (isotonic) infusion in sterile water 100 mL/hr at 09/12/20 0411     LOS: 1 day        David Rosemond Gerome Apley, MD

## 2020-09-12 NOTE — Consult Note (Signed)
   [facility] CM Inpatient Consult   09/12/2020  David Burton 12-03-1945 [id number]  Patient is currently pending with [facility] CM) for chronic disease management services. Referral had been received by ambulatory [facility] CM RN case manager from MD office for assistance with medication management and possible assistance with activities of daily living.   Plan: Hospital liaison will continue to follow for progression and disposition.   Of note, [facility] Care Management services does not replace or interfere with any services that are arranged by inpatient case management or social work.  Netta Cedars, MSN, [facility] Liaison Nurse Mobile Phone [phone]  Toll free office [phone]

## 2020-09-13 ENCOUNTER — Ambulatory Visit: Payer: Self-pay | Admitting: *Deleted

## 2020-09-13 DIAGNOSIS — E1165 Type 2 diabetes mellitus with hyperglycemia: Secondary | ICD-10-CM

## 2020-09-13 DIAGNOSIS — E669 Obesity, unspecified: Secondary | ICD-10-CM | POA: Diagnosis present

## 2020-09-13 LAB — BASIC METABOLIC PANEL
Anion gap: 9 (ref 5–15)
BUN: 56 mg/dL — ABNORMAL HIGH (ref 8–23)
CO2: 18 mmol/L — ABNORMAL LOW (ref 22–32)
Calcium: 8.8 mg/dL — ABNORMAL LOW (ref 8.9–10.3)
Chloride: 111 mmol/L (ref 98–111)
Creatinine, Ser: 1.81 mg/dL — ABNORMAL HIGH (ref 0.61–1.24)
GFR, Estimated: 39 mL/min — ABNORMAL LOW (ref >60)
Glucose, Bld: 169 mg/dL — ABNORMAL HIGH (ref 70–99)
Potassium: 2.8 mmol/L — ABNORMAL LOW (ref 3.5–5.1)
Sodium: 138 mmol/L (ref 135–145)

## 2020-09-13 LAB — GLUCOSE, CAPILLARY
Glucose-Capillary: 168 mg/dL — ABNORMAL HIGH (ref 70–99)
Glucose-Capillary: 202 mg/dL — ABNORMAL HIGH (ref 70–99)
Glucose-Capillary: 257 mg/dL — ABNORMAL HIGH (ref 70–99)
Glucose-Capillary: 255 mg/dL — ABNORMAL HIGH (ref 70–99)

## 2020-09-13 LAB — MAGNESIUM
Magnesium: 2.1 mg/dL (ref 1.7–2.4)
Magnesium: 2.2 mg/dL (ref 1.7–2.4)

## 2020-09-13 LAB — POTASSIUM: Potassium: 3.7 mmol/L (ref 3.5–5.1)

## 2020-09-13 MED ORDER — POTASSIUM CHLORIDE CRYS ER 20 MEQ PO TBCR
40.0000 meq | EXTENDED_RELEASE_TABLET | Freq: Once | ORAL | Status: AC
  Administered 2020-09-13: 40 meq via ORAL
  Filled 2020-09-13: qty 2

## 2020-09-13 MED ORDER — INSULIN GLARGINE 100 UNIT/ML ~~LOC~~ SOLN
8.0000 [IU] | Freq: Every day | SUBCUTANEOUS | Status: DC
  Administered 2020-09-14 – 2020-09-15 (×3): 8 [IU] via SUBCUTANEOUS
  Filled 2020-09-13 (×4): qty 0.08

## 2020-09-13 MED ORDER — POTASSIUM CHLORIDE 10 MEQ/100ML IV SOLN
10.0000 meq | INTRAVENOUS | Status: AC
  Administered 2020-09-13 (×3): 10 meq via INTRAVENOUS
  Filled 2020-09-13 (×3): qty 100

## 2020-09-13 NOTE — Progress Notes (Signed)
PROGRESS NOTE    David Burton  UKG:[id number] DOB: 04-17-1946 DOA: 09/11/2020 PCP: Sandrea Hughs, NP     Brief Narrative:  Mr. Maull was admitted to the hospital with a working diagnosis of acute kidney injury on chronic kidney disease stage IIIb.  75 year old BM PMHx hypertension, dyslipidemia, coronary artery disease, CVA, type 2 diabetes mellitus and chronic kidney disease stage IIIb   Presented with decreased p.o. intake.  Patient sustained a tongue injury after a dental procedure last week, with significant decrease in his p.o. intake for the last 4 days. He has been taking ibuprofen 200 mg tablets over the last 3 weeks for tooth ache, plan for tooth extraction this week. His symptoms have been associated with generalized weakness but no fever or chills.  On his initial physical examination blood pressure 113/69, heart rate 94, respiratory rate 16, oxygen saturation 100%, his lungs are clear to auscultation bilaterally, heart S1-S2, present rhythm, soft abdomen, no lower extremity edema.  Sodium 134, potassium 3.2, chloride 110, bicarb 8, glucose 167, BUN 111, creatinine 4.0, anion gap 16.  White count 10.5, hemoglobin 16.5, hematocrit 46.9, platelets 178.   SARS COVID-19 negative.  Patient was placed on IV bicarbonate for hydration with improvement in renal function.    Subjective: Afebrile overnight, A/O x4.  States the laceration on his tongue has healed enough that he is able to eat and drink now.   Assessment & Plan: Covid vaccination; vaccinated 2/3   Principal Problem:   Acute renal failure superimposed on chronic kidney disease ([place]) Active Problems:   Essential hypertension   CAD (coronary artery disease), native coronary artery   Type 2 diabetes, controlled, with peripheral neuropathy (HCC)   Generalized weakness   Metabolic acidosis   Mouth pain   DNR (do not resuscitate)   Obese   Morbidly obese ([place])  Acute on CKD stage IIIb (baselineCr 1.9---  2.3) Lab Results  Component Value Date   CREATININE 1.81 (H) 09/13/2020   CREATININE 2.70 (H) 09/12/2020   CREATININE 3.50 (H) 09/11/2020   CREATININE 4.05 (H) 09/11/2020   CREATININE 2.38 (H) 09/06/2020  -At baseline -Normal saline 55ml/hr  Anion gap metabolic acidosis -Resolved  Essential HTN -Amlodipine 5 mg daily -Imdur 60 mg daily -Metoprolol 12.5 mg BID  CAD -Asymptomatic  DM type II uncontrolled with hyperglycemia -4/6 hemoglobin A1c= 8.1 -4/13 Lantus 8 units daily -Moderate SSI  HLD -Lipid panel pending  Hypokalemia -Potassium goal> 4 -K-Dur 40 mEq+ potassium IV 30 mEq -Recheck K/mg@1600    Dental/ toungue injury.  -Resolved -Po intake has improved, continue pain control with oxycodone. -Follow with dental surgery as outpatient.  -Continue with Augmentin for now .   Morbidly obese (BMI 37.46 kg/m)           DVT prophylaxis: Subcu heparin Code Status: DNR Family Communication:  Status is: Inpatient    Dispo: The patient is from: Home              Anticipated d/c is to: SNF?              Anticipated d/c date is: 4/15              Patient currently unstable      Consultants:    Procedures/Significant Events:    I have personally reviewed and interpreted all radiology studies and my findings are as above.  VENTILATOR SETTINGS: Room air 4/13 SPO2 97%   Cultures   Antimicrobials:    Devices    LINES /  TUBES:      Continuous Infusions: . sodium chloride 50 mL/hr at 09/12/20 1432     Objective: Vitals:   09/12/20 1654 09/13/20 0151 09/13/20 1200 09/13/20 1826  BP: 118/74 135/70 127/62 135/77  Pulse: 84 81  88  Resp: 18 18 18 19   Temp: 98.2 F (36.8 C) 98.3 F (36.8 C) 98.4 F (36.9 C) 98.7 F (37.1 C)  TempSrc: Oral Oral Oral Oral  SpO2: 100% 97% 100% 100%  Weight:      Height:        Intake/Output Summary (Last 24 hours) at 09/13/2020 1953 Last data filed at 09/13/2020 1612 Gross per 24 hour  Intake  --  Output 1200 ml  Net -1200 ml   Filed Weights   09/11/20 1756  Weight: 108.5 kg    Examination:  General: A/O x4, No acute respiratory distress Eyes: negative scleral hemorrhage, negative anisocoria, negative icterus ENT: Negative Runny nose, negative gingival bleeding, Neck:  Negative scars, masses, torticollis, lymphadenopathy, JVD Lungs: Clear to auscultation bilaterally without wheezes or crackles Cardiovascular: Regular rate and rhythm without murmur gallop or rub normal S1 and S2 Abdomen: MORBIDLY OBESE, negative abdominal pain, nondistended, positive soft, bowel sounds, no rebound, no ascites, no appreciable mass Extremities: No significant cyanosis, clubbing, or edema bilateral lower extremities Skin: Negative rashes, lesions, ulcers Psychiatric:  Negative depression, negative anxiety, negative fatigue, negative mania  Central nervous system:  Cranial nerves II through XII intact, tongue/uvula midline, all extremities muscle strength 5/5, sensation intact throughout, negative dysarthria, negative expressive aphasia, negative receptive aphasia.  .     Data Reviewed: Care during the described time interval was provided by me .  I have reviewed this patient's available data, including medical history, events of note, physical examination, and all test results as part of my evaluation.  CBC: Recent Labs  Lab 09/11/20 1019 09/11/20 1355 09/12/20 0203  WBC 10.5  --  8.3  NEUTROABS 6.6  --   --   HGB 16.5 13.9 13.0  HCT 46.9 41.0 35.3*  MCV 87.8  --  85.3  PLT 178  --  619   Basic Metabolic Panel: Recent Labs  Lab 09/11/20 1019 09/11/20 1355 09/12/20 0203 09/13/20 0717 09/13/20 1630  NA 134* 137 133* 138  --   K 3.2* 3.9 2.5* 2.8* 3.7  CL 110 114* 110 111  --   CO2 8*  --  13* 18*  --   GLUCOSE 167* 157* 130* 169*  --   BUN 111* 112* 93* 56*  --   CREATININE 4.05* 3.50* 2.70* 1.81*  --   CALCIUM 8.5*  --  7.9* 8.8*  --   MG  --   --  2.3 2.1 2.2    GFR: Estimated Creatinine Clearance: 42.7 mL/min (A) (by C-G formula based on SCr of 1.81 mg/dL (H)). Liver Function Tests: Recent Labs  Lab 09/11/20 1019  AST 17  ALT 16  ALKPHOS 75  BILITOT 0.9  PROT 8.0  ALBUMIN 3.8   No results for input(s): LIPASE, AMYLASE in the last 168 hours. No results for input(s): AMMONIA in the last 168 hours. Coagulation Profile: No results for input(s): INR, PROTIME in the last 168 hours. Cardiac Enzymes: No results for input(s): CKTOTAL, CKMB, CKMBINDEX, TROPONINI in the last 168 hours. BNP (last 3 results) No results for input(s): PROBNP in the last 8760 hours. HbA1C: No results for input(s): HGBA1C in the last 72 hours. CBG: Recent Labs  Lab 09/12/20 1611 09/12/20 2150 09/13/20  8101 09/13/20 1158 09/13/20 1627  GLUCAP 176* 236* 168* 202* 257*   Lipid Profile: No results for input(s): CHOL, HDL, LDLCALC, TRIG, CHOLHDL, LDLDIRECT in the last 72 hours. Thyroid Function Tests: No results for input(s): TSH, T4TOTAL, FREET4, T3FREE, THYROIDAB in the last 72 hours. Anemia Panel: No results for input(s): VITAMINB12, FOLATE, FERRITIN, TIBC, IRON, RETICCTPCT in the last 72 hours. Sepsis Labs: No results for input(s): PROCALCITON, LATICACIDVEN in the last 168 hours.  Recent Results (from the past 240 hour(s))  SARS CORONAVIRUS 2 (TAT 6-24 HRS) Nasopharyngeal Nasopharyngeal Swab     Status: None   Collection Time: 09/11/20  2:12 PM   Specimen: Nasopharyngeal Swab  Result Value Ref Range Status   SARS Coronavirus 2 NEGATIVE NEGATIVE Final    Comment: (NOTE) SARS-CoV-2 target nucleic acids are NOT DETECTED.  The SARS-CoV-2 RNA is generally detectable in upper and lower respiratory specimens during the acute phase of infection. Negative results do not preclude SARS-CoV-2 infection, do not rule out co-infections with other pathogens, and should not be used as the sole basis for treatment or other patient management decisions. Negative  results must be combined with clinical observations, patient history, and epidemiological information. The expected result is Negative.  Fact Sheet for Patients: SugarRoll.be  Fact Sheet for Healthcare Providers: https://www.Junius Faucett-mathews.com/  This test is not yet approved or cleared by the [place] FDA and  has been authorized for detection and/or diagnosis of SARS-CoV-2 by FDA under an Emergency Use Authorization (EUA). This EUA will remain  in effect (meaning this test can be used) for the duration of the COVID-19 declaration under Se ction 564(b)(1) of the Act, 21 U.S.C. section 360bbb-3(b)(1), unless the authorization is terminated or revoked sooner.  Performed at [facility], [place] [address], [place], [place] [address]          Radiology Studies: US RENAL  Result Date: 09/12/2020 CLINICAL DATA:  Renal failure EXAM: RENAL / URINARY TRACT ULTRASOUND COMPLETE COMPARISON:  06/16/2018 FINDINGS: Right Kidney: Renal measurements: 11.1 x 5.2 x 4.9 cm = volume: 148.6 mL. Echogenicity within normal limits. No mass or hydronephrosis visualized. Left Kidney: Renal measurements: 9.6 x 6.0 x 4.8 cm = volume: 143.7 mL. Echogenicity within normal limits. No mass or hydronephrosis visualized. Bladder: Appears normal for degree of bladder distention. Other: Enlarged median lobe of the prostate is identified, unchanged since prior CT. IMPRESSION: 1. Grossly unremarkable renal ultrasound. Electronically Signed   By: Randa Ngo M.D.   On: 09/12/2020 21:01        Scheduled Meds: . amLODipine  5 mg Oral Daily  . amoxicillin-clavulanate  1 tablet Oral BID  . ezetimibe  10 mg Oral Daily  . heparin  5,000 Units Subcutaneous Q8H  . insulin aspart  0-15 Units Subcutaneous TID WC  . insulin aspart  0-5 Units Subcutaneous QHS  . insulin glargine  8 Units Subcutaneous Daily  . isosorbide mononitrate  60 mg Oral Daily  . metoprolol tartrate   12.5 mg Oral BID  . saccharomyces boulardii  250 mg Oral BID  . sodium chloride flush  3 mL Intravenous Q12H   Continuous Infusions: . sodium chloride 50 mL/hr at 09/12/20 1432     LOS: 2 days    Time spent:40 min    Etta Gassett, Geraldo Docker, MD [facility]   If 7PM-7AM, please contact night-coverage 09/13/2020, 7:53 PM

## 2020-09-13 NOTE — Progress Notes (Signed)
Physical Therapy Treatment Patient Details Name: David Burton MRN: [id number] DOB: 09-Dec-1945 Today's Date: 09/13/2020    History of Present Illness David Burton is a 75 y/o male who reported to ED due to a tongue injury following oral surgery. Pt admitted on 09/11/20 with acute renal failure. PMH includes DM, prior CVA (July 2020), HTN, CKD, MI, Bell's Palsy, and CAD    PT Comments    Pt progressing with mobility. Tried ambulation without assistive device in the hallway. Pt unsteady but no overt loss of balance. Recommended to pt that he use his walker initially when he returns home.    Follow Up Recommendations  Home health PT;Supervision - Intermittent     Equipment Recommendations  None recommended by PT    Recommendations for Other Services       Precautions / Restrictions Precautions Precautions: Fall    Mobility  Bed Mobility Overal bed mobility: Modified Independent Bed Mobility: Supine to Sit;Sit to Supine     Supine to sit: Modified independent (Device/Increase time);HOB elevated Sit to supine: Modified independent (Device/Increase time)   General bed mobility comments: Incr time and effort and use of bed rail    Transfers Overall transfer level: Modified independent Equipment used: None Transfers: Sit to/from Stand Sit to Stand: Modified independent (Device/Increase time)            Ambulation/Gait Ambulation/Gait assistance: Min guard Gait Distance (Feet): 125 Feet Assistive device: None (hand rail) Gait Pattern/deviations: Step-through pattern;Decreased stride length;Drifts right/left Gait velocity: decr Gait velocity interpretation: <1.8 ft/sec, indicate of risk for recurrent falls General Gait Details: Pt with hesitant gait in hallway without assistive device. Used hand rail intermittently   Marine scientist [place] (Stroke Patients Only)       Balance Overall balance assessment: Needs  assistance Sitting-balance support: No upper extremity supported;Feet supported Sitting balance-Leahy Scale: Good     Standing balance support: No upper extremity supported Standing balance-Leahy Scale: Fair                              Cognition Arousal/Alertness: Awake/alert Behavior During Therapy: WFL for tasks assessed/performed Overall Cognitive Status: Within Functional Limits for tasks assessed                                        Exercises      General Comments        Pertinent Vitals/Pain Pain Assessment: Faces Faces Pain Scale: No hurt    Home Living                      Prior Function            PT Goals (current goals can now be found in the care plan section) Acute Rehab PT Goals Patient Stated Goal: to get my mouth better Progress towards PT goals: Progressing toward goals    Frequency    Min 3X/week      PT Plan Current plan remains appropriate    Co-evaluation              AM-PAC PT "6 Clicks" Mobility   Outcome Measure  Help needed turning from your back to your side while in a flat bed without using bedrails?: None Help needed moving from  lying on your back to sitting on the side of a flat bed without using bedrails?: A Little Help needed moving to and from a bed to a chair (including a wheelchair)?: None Help needed standing up from a chair using your arms (e.g., wheelchair or bedside chair)?: None Help needed to walk in hospital room?: A Little Help needed climbing 3-5 steps with a railing? : A Little 6 Click Score: 21    End of Session Equipment Utilized During Treatment: Gait belt Activity Tolerance: Patient tolerated treatment well Patient left: in bed;with call bell/phone within reach;with bed alarm set Nurse Communication: Mobility status PT Visit Diagnosis: Unsteadiness on feet (R26.81);Other abnormalities of gait and mobility (R26.89)     Time: 5271-2929 PT Time Calculation  (min) (ACUTE ONLY): 21 min  Charges:  $Gait Training: 8-22 mins                     [place] Pager [phone] Office [place] 09/13/2020, 4:49 PM

## 2020-09-14 ENCOUNTER — Ambulatory Visit: Payer: Medicare Other | Admitting: Family

## 2020-09-14 ENCOUNTER — Encounter (HOSPITAL_COMMUNITY): Payer: Self-pay | Admitting: Internal Medicine

## 2020-09-14 DIAGNOSIS — E876 Hypokalemia: Secondary | ICD-10-CM

## 2020-09-14 LAB — CBC WITH DIFFERENTIAL/PLATELET
Abs Immature Granulocytes: 0.12 10*3/uL — ABNORMAL HIGH (ref 0.00–0.07)
Basophils Absolute: 0.1 10*3/uL (ref 0.0–0.1)
Basophils Relative: 1 %
Eosinophils Absolute: 0.3 10*3/uL (ref 0.0–0.5)
Eosinophils Relative: 3 %
HCT: 33.5 % — ABNORMAL LOW (ref 39.0–52.0)
Hemoglobin: 12 g/dL — ABNORMAL LOW (ref 13.0–17.0)
Immature Granulocytes: 1 %
Lymphocytes Relative: 22 %
Lymphs Abs: 2 10*3/uL (ref 0.7–4.0)
MCH: 31.7 pg (ref 26.0–34.0)
MCHC: 35.8 g/dL (ref 30.0–36.0)
MCV: 88.4 fL (ref 80.0–100.0)
Monocytes Absolute: 1.1 10*3/uL — ABNORMAL HIGH (ref 0.1–1.0)
Monocytes Relative: 13 %
Neutro Abs: 5.3 10*3/uL (ref 1.7–7.7)
Neutrophils Relative %: 60 %
Platelets: 156 10*3/uL (ref 150–400)
RBC: 3.79 MIL/uL — ABNORMAL LOW (ref 4.22–5.81)
RDW: 14.9 % (ref 11.5–15.5)
WBC: 8.9 10*3/uL (ref 4.0–10.5)
nRBC: 0 % (ref 0.0–0.2)

## 2020-09-14 LAB — COMPREHENSIVE METABOLIC PANEL
ALT: 21 U/L (ref 0–44)
AST: 19 U/L (ref 15–41)
Albumin: 2.9 g/dL — ABNORMAL LOW (ref 3.5–5.0)
Alkaline Phosphatase: 53 U/L (ref 38–126)
Anion gap: 8 (ref 5–15)
BUN: 38 mg/dL — ABNORMAL HIGH (ref 8–23)
CO2: 18 mmol/L — ABNORMAL LOW (ref 22–32)
Calcium: 8.9 mg/dL (ref 8.9–10.3)
Chloride: 114 mmol/L — ABNORMAL HIGH (ref 98–111)
Creatinine, Ser: 1.62 mg/dL — ABNORMAL HIGH (ref 0.61–1.24)
GFR, Estimated: 45 mL/min — ABNORMAL LOW (ref >60)
Glucose, Bld: 176 mg/dL — ABNORMAL HIGH (ref 70–99)
Potassium: 3.4 mmol/L — ABNORMAL LOW (ref 3.5–5.1)
Sodium: 140 mmol/L (ref 135–145)
Total Bilirubin: 0.6 mg/dL (ref 0.3–1.2)
Total Protein: 6.4 g/dL — ABNORMAL LOW (ref 6.5–8.1)

## 2020-09-14 LAB — MAGNESIUM: Magnesium: 1.9 mg/dL (ref 1.7–2.4)

## 2020-09-14 LAB — LIPID PANEL
Cholesterol: 143 mg/dL (ref 0–200)
HDL: 25 mg/dL — ABNORMAL LOW (ref >40)
LDL Cholesterol: 91 mg/dL (ref 0–99)
Total CHOL/HDL Ratio: 5.7 RATIO
Triglycerides: 134 mg/dL (ref <150)
VLDL: 27 mg/dL (ref 0–40)

## 2020-09-14 LAB — GLUCOSE, CAPILLARY
Glucose-Capillary: 179 mg/dL — ABNORMAL HIGH (ref 70–99)
Glucose-Capillary: 209 mg/dL — ABNORMAL HIGH (ref 70–99)
Glucose-Capillary: 255 mg/dL — ABNORMAL HIGH (ref 70–99)
Glucose-Capillary: 233 mg/dL — ABNORMAL HIGH (ref 70–99)

## 2020-09-14 LAB — PHOSPHORUS: Phosphorus: 1.6 mg/dL — ABNORMAL LOW (ref 2.5–4.6)

## 2020-09-14 MED ORDER — AMLODIPINE BESYLATE 10 MG PO TABS
10.0000 mg | ORAL_TABLET | Freq: Every day | ORAL | Status: DC
  Administered 2020-09-15: 10 mg via ORAL
  Filled 2020-09-14: qty 1

## 2020-09-14 MED ORDER — MAGNESIUM SULFATE 2 GM/50ML IV SOLN
2.0000 g | Freq: Once | INTRAVENOUS | Status: AC
  Administered 2020-09-14: 2 g via INTRAVENOUS
  Filled 2020-09-14: qty 50

## 2020-09-14 MED ORDER — INSULIN ASPART 100 UNIT/ML ~~LOC~~ SOLN
5.0000 [IU] | Freq: Three times a day (TID) | SUBCUTANEOUS | Status: DC
  Administered 2020-09-14 – 2020-09-15 (×2): 5 [IU] via SUBCUTANEOUS

## 2020-09-14 MED ORDER — POTASSIUM PHOSPHATES 15 MMOLE/5ML IV SOLN
30.0000 mmol | Freq: Once | INTRAVENOUS | Status: AC
  Administered 2020-09-14: 30 mmol via INTRAVENOUS
  Filled 2020-09-14: qty 10

## 2020-09-14 MED ORDER — AMLODIPINE BESYLATE 5 MG PO TABS
5.0000 mg | ORAL_TABLET | Freq: Once | ORAL | Status: AC
  Administered 2020-09-14: 5 mg via ORAL
  Filled 2020-09-14: qty 1

## 2020-09-14 MED ORDER — POTASSIUM PHOSPHATES 15 MMOLE/5ML IV SOLN: 30.0000 mmol | Freq: Once | INTRAVENOUS | Status: DC

## 2020-09-14 MED ORDER — ATORVASTATIN CALCIUM 10 MG PO TABS
20.0000 mg | ORAL_TABLET | Freq: Every day | ORAL | Status: DC
  Administered 2020-09-14 – 2020-09-15 (×2): 20 mg via ORAL
  Filled 2020-09-14 (×2): qty 2

## 2020-09-14 NOTE — Progress Notes (Addendum)
PROGRESS NOTE    David Burton  ULA:[id number] DOB: Oct 16, 1945 DOA: 09/11/2020 PCP: Sandrea Hughs, NP     Brief Narrative:  David Burton was admitted to the hospital with a working diagnosis of acute kidney injury on chronic kidney disease stage IIIb.  75 year old BM PMHx hypertension, dyslipidemia, coronary artery disease, CVA, type 2 diabetes mellitus and chronic kidney disease stage IIIb   Presented with decreased p.o. intake.  Patient sustained a tongue injury after a dental procedure last week, with significant decrease in his p.o. intake for the last 4 days. He has been taking ibuprofen 200 mg tablets over the last 3 weeks for tooth ache, plan for tooth extraction this week. His symptoms have been associated with generalized weakness but no fever or chills.  On his initial physical examination blood pressure 113/69, heart rate 94, respiratory rate 16, oxygen saturation 100%, his lungs are clear to auscultation bilaterally, heart S1-S2, present rhythm, soft abdomen, no lower extremity edema.  Sodium 134, potassium 3.2, chloride 110, bicarb 8, glucose 167, BUN 111, creatinine 4.0, anion gap 16.  White count 10.5, hemoglobin 16.5, hematocrit 46.9, platelets 178.   SARS COVID-19 negative.  Patient was placed on IV bicarbonate for hydration with improvement in renal function.    Subjective: 4/14 afebrile overnight, A/O x4 laying in bed comfortably.  States ambulated in hallway with nurse today.   Assessment & Plan: Covid vaccination; vaccinated 2/3   Principal Problem:   Acute renal failure superimposed on chronic kidney disease ([place]) Active Problems:   Essential hypertension   CAD (coronary artery disease), native coronary artery   Type 2 diabetes, controlled, with peripheral neuropathy (HCC)   Generalized weakness   Metabolic acidosis   Mouth pain   DNR (do not resuscitate)   Obese   Morbidly obese ([place])  Acute on CKD stage IIIb (baselineCr 1.9--- 2.3) Lab Results   Component Value Date   CREATININE 1.62 (H) 09/14/2020   CREATININE 1.81 (H) 09/13/2020   CREATININE 2.70 (H) 09/12/2020   CREATININE 3.50 (H) 09/11/2020   CREATININE 4.05 (H) 09/11/2020  -At baseline -4/14 KVO normal saline 49ml/hr  Anion gap metabolic acidosis -Resolved  Essential HTN -4/14 increase Amlodipine 10 mg daily: Amlodipine 5 mg x 1 dose -Imdur 60 mg daily -Metoprolol 12.5 mg BID  CAD -Asymptomatic  DM type II uncontrolled with hyperglycemia -4/6 hemoglobin A1c= 8.1 -4/13 Lantus 8 units daily -4/14 NovoLog 5 units qac -Moderate SSI  HLD -4/14 LDL= 91: Goal<70 -4/14 Lipitor 20 mg daily  Hypokalemia - Potassium goal> 4 - K-Phos 30 mmol  Hypophosphatemia - Phosphorus goal> 2.5 - See hypokalemia  Hypomagnesmia - Magnesium goal> 2   Dental/ toungue injury.  -Resolved -Po intake has improved, continue pain control with oxycodone. -Follow with dental surgery as outpatient.  -Continue with Augmentin for now .   Morbidly obese (BMI 37.46 kg/m)             DVT prophylaxis: Subcu heparin Code Status: DNR Family Communication: 4/14 spoke with Karna Christmas (daughter) explained plan of care answered all questions Status is: Inpatient    Dispo: The patient is from: Home              Anticipated d/c is to: SNF?              Anticipated d/c date is: 4/15              Patient currently unstable      Consultants:    Procedures/Significant Events:  I have personally reviewed and interpreted all radiology studies and my findings are as above.  VENTILATOR SETTINGS: Room air 4/14 SPO2 97%   Cultures   Antimicrobials:    Devices    LINES / TUBES:      Continuous Infusions: . sodium chloride 50 mL/hr at 09/14/20 0807     Objective: Vitals:   09/13/20 1200 09/13/20 1826 09/14/20 0044 09/14/20 0533  BP: 127/62 135/77 140/75 (!) 143/72  Pulse:  88 77 84  Resp: 18 19 18 18   Temp: 98.4 F (36.9 C) 98.7 F (37.1 C) 99.1 F  (37.3 C) 98.9 F (37.2 C)  TempSrc: Oral Oral Oral Oral  SpO2: 100% 100% 97% 98%  Weight:      Height:        Intake/Output Summary (Last 24 hours) at 09/14/2020 7494 Last data filed at 09/14/2020 0630 Gross per 24 hour  Intake --  Output 1200 ml  Net -1200 ml   Filed Weights   09/11/20 1756  Weight: 108.5 kg    Examination:  General: A/O x4, No acute respiratory distress Eyes: negative scleral hemorrhage, negative anisocoria, negative icterus ENT: Negative Runny nose, negative gingival bleeding, Neck:  Negative scars, masses, torticollis, lymphadenopathy, JVD Lungs: Clear to auscultation bilaterally without wheezes or crackles Cardiovascular: Regular rate and rhythm without murmur gallop or rub normal S1 and S2 Abdomen: MORBIDLY OBESE, negative abdominal pain, nondistended, positive soft, bowel sounds, no rebound, no ascites, no appreciable mass Extremities: No significant cyanosis, clubbing, or edema bilateral lower extremities Skin: Negative rashes, lesions, ulcers Psychiatric:  Negative depression, negative anxiety, negative fatigue, negative mania  Central nervous system:  Cranial nerves II through XII intact, tongue/uvula midline, all extremities muscle strength 5/5, sensation intact throughout, negative dysarthria, negative expressive aphasia, negative receptive aphasia.  .     Data Reviewed: Care during the described time interval was provided by me .  I have reviewed this patient's available data, including medical history, events of note, physical examination, and all test results as part of my evaluation.  CBC: Recent Labs  Lab 09/11/20 1019 09/11/20 1355 09/12/20 0203 09/14/20 0414  WBC 10.5  --  8.3 8.9  NEUTROABS 6.6  --   --  5.3  HGB 16.5 13.9 13.0 12.0*  HCT 46.9 41.0 35.3* 33.5*  MCV 87.8  --  85.3 88.4  PLT 178  --  155 496   Basic Metabolic Panel: Recent Labs  Lab 09/11/20 1019 09/11/20 1355 09/12/20 0203 09/13/20 0717 09/13/20 1630  09/14/20 0414  NA 134* 137 133* 138  --  140  K 3.2* 3.9 2.5* 2.8* 3.7 3.4*  CL 110 114* 110 111  --  114*  CO2 8*  --  13* 18*  --  18*  GLUCOSE 167* 157* 130* 169*  --  176*  BUN 111* 112* 93* 56*  --  38*  CREATININE 4.05* 3.50* 2.70* 1.81*  --  1.62*  CALCIUM 8.5*  --  7.9* 8.8*  --  8.9  MG  --   --  2.3 2.1 2.2 1.9  PHOS  --   --   --   --   --  1.6*   GFR: Estimated Creatinine Clearance: 47.7 mL/min (A) (by C-G formula based on SCr of 1.62 mg/dL (H)). Liver Function Tests: Recent Labs  Lab 09/11/20 1019 09/14/20 0414  AST 17 19  ALT 16 21  ALKPHOS 75 53  BILITOT 0.9 0.6  PROT 8.0 6.4*  ALBUMIN 3.8 2.9*  No results for input(s): LIPASE, AMYLASE in the last 168 hours. No results for input(s): AMMONIA in the last 168 hours. Coagulation Profile: No results for input(s): INR, PROTIME in the last 168 hours. Cardiac Enzymes: No results for input(s): CKTOTAL, CKMB, CKMBINDEX, TROPONINI in the last 168 hours. BNP (last 3 results) No results for input(s): PROBNP in the last 8760 hours. HbA1C: No results for input(s): HGBA1C in the last 72 hours. CBG: Recent Labs  Lab 09/13/20 0609 09/13/20 1158 09/13/20 1627 09/13/20 2211 09/14/20 0620  GLUCAP 168* 202* 257* 255* 179*   Lipid Profile: Recent Labs    09/14/20 0414  CHOL 143  HDL 25*  LDLCALC 91  TRIG 134  CHOLHDL 5.7   Thyroid Function Tests: No results for input(s): TSH, T4TOTAL, FREET4, T3FREE, THYROIDAB in the last 72 hours. Anemia Panel: No results for input(s): VITAMINB12, FOLATE, FERRITIN, TIBC, IRON, RETICCTPCT in the last 72 hours. Sepsis Labs: No results for input(s): PROCALCITON, LATICACIDVEN in the last 168 hours.  Recent Results (from the past 240 hour(s))  SARS CORONAVIRUS 2 (TAT 6-24 HRS) Nasopharyngeal Nasopharyngeal Swab     Status: None   Collection Time: 09/11/20  2:12 PM   Specimen: Nasopharyngeal Swab  Result Value Ref Range Status   SARS Coronavirus 2 NEGATIVE NEGATIVE Final     Comment: (NOTE) SARS-CoV-2 target nucleic acids are NOT DETECTED.  The SARS-CoV-2 RNA is generally detectable in upper and lower respiratory specimens during the acute phase of infection. Negative results do not preclude SARS-CoV-2 infection, do not rule out co-infections with other pathogens, and should not be used as the sole basis for treatment or other patient management decisions. Negative results must be combined with clinical observations, patient history, and epidemiological information. The expected result is Negative.  Fact Sheet for Patients: SugarRoll.be  Fact Sheet for Healthcare Providers: https://www.-mathews.com/  This test is not yet approved or cleared by the [place] FDA and  has been authorized for detection and/or diagnosis of SARS-CoV-2 by FDA under an Emergency Use Authorization (EUA). This EUA will remain  in effect (meaning this test can be used) for the duration of the COVID-19 declaration under Se ction 564(b)(1) of the Act, 21 U.S.C. section 360bbb-3(b)(1), unless the authorization is terminated or revoked sooner.  Performed at [facility], [place] [address], [place], [place] [address]          Radiology Studies: US RENAL  Result Date: 09/12/2020 CLINICAL DATA:  Renal failure EXAM: RENAL / URINARY TRACT ULTRASOUND COMPLETE COMPARISON:  06/16/2018 FINDINGS: Right Kidney: Renal measurements: 11.1 x 5.2 x 4.9 cm = volume: 148.6 mL. Echogenicity within normal limits. No mass or hydronephrosis visualized. Left Kidney: Renal measurements: 9.6 x 6.0 x 4.8 cm = volume: 143.7 mL. Echogenicity within normal limits. No mass or hydronephrosis visualized. Bladder: Appears normal for degree of bladder distention. Other: Enlarged median lobe of the prostate is identified, unchanged since prior CT. IMPRESSION: 1. Grossly unremarkable renal ultrasound. Electronically Signed   By: Randa Ngo M.D.   On:  09/12/2020 21:01        Scheduled Meds: . amLODipine  5 mg Oral Daily  . amoxicillin-clavulanate  1 tablet Oral BID  . ezetimibe  10 mg Oral Daily  . heparin  5,000 Units Subcutaneous Q8H  . insulin aspart  0-15 Units Subcutaneous TID WC  . insulin aspart  0-5 Units Subcutaneous QHS  . insulin glargine  8 Units Subcutaneous Daily  . isosorbide mononitrate  60 mg Oral Daily  .  metoprolol tartrate  12.5 mg Oral BID  . saccharomyces boulardii  250 mg Oral BID  . sodium chloride flush  3 mL Intravenous Q12H   Continuous Infusions: . sodium chloride 50 mL/hr at 09/14/20 0807     LOS: 3 days    Time spent:40 min    Laster Appling, Geraldo Docker, MD [facility]   If 7PM-7AM, please contact night-coverage 09/14/2020, 9:23 AM

## 2020-09-14 NOTE — Progress Notes (Signed)
Physical Therapy Treatment Patient Details Name: David Burton MRN: [id number] DOB: 05-28-1946 Today's Date: 09/14/2020    History of Present Illness David Burton is a 76 y/o male who reported to ED due to a tongue injury following oral surgery. Pt admitted on 09/11/20 with acute renal failure. PMH includes DM, prior CVA (July 2020), HTN, CKD, MI, Bell's Palsy, and CAD    PT Comments    Pt received in supine, bed alarming and incontinent of urine, RN not available to assist him at this time and pt agreeable to additional therapy session for BLE endurance/strength building. Pt needing increased time/cues for safety and transfer training this afternoon due to fatigue, needing Supervision to stand from chair/bed heights. Pt dependent for posterior peri-care in stance and needing min guard for dynamic standing tasks without RW. Pt continues to benefit from PT services to progress toward functional mobility goals. Continue to recommend HHPT and increased supervision/assist at home.   Follow Up Recommendations  Home health PT;Supervision - Intermittent     Equipment Recommendations  None recommended by PT    Recommendations for Other Services       Precautions / Restrictions Precautions Precautions: Fall Restrictions Weight Bearing Restrictions: No    Mobility  Bed Mobility Overal bed mobility: Modified Independent Bed Mobility: Supine to Sit;Sit to Supine     Supine to sit: Modified independent (Device/Increase time);HOB elevated Sit to supine: Modified independent (Device/Increase time)   General bed mobility comments: Incr time and effort, needs bed elevated and rail in PM session    Transfers Overall transfer level: Needs assistance Equipment used: None Transfers: Sit to/from Stand Sit to Stand: Supervision         General transfer comment: from EOB and chair with supervision/cues for hand placement, using chair frame for posterior leg stability when rising so needed assist to  stabilize recliner frame for safety; x7 total reps (1 from EOB 6 from chair)  Ambulation/Gait Ambulation/Gait assistance: Min guard Gait Distance (Feet): 10 Feet Assistive device: None Gait Pattern/deviations: Step-through pattern;Decreased stride length;Drifts right/left Gait velocity: decr   General Gait Details: from bed>chair, cues for line awareness and safety       Wheelchair Mobility    Modified Rankin (Stroke Patients Only)       Balance Overall balance assessment: Needs assistance Sitting-balance support: No upper extremity supported;Feet supported Sitting balance-Leahy Scale: Good     Standing balance support: No upper extremity supported Standing balance-Leahy Scale: Fair Standing balance comment: able to stand at bedside unsupported but posterior lean initially upon standing (used to using bed frame for standing balance support when rising) needing min guard or RW support for stability          Cognition Arousal/Alertness: Awake/alert Behavior During Therapy: WFL for tasks assessed/performed Overall Cognitive Status: No family/caregiver present to determine baseline cognitive functioning Area of Impairment: Problem solving                 Orientation Level: Disoriented to;Time           Problem Solving: Slow processing;Requires verbal cues General Comments: pt received in supine with bed alarming and noted to be incontinent of urine, needs cues for sequencing/safety throughout with limited carryover of safety cues for hand placement with transfers. pleasant, cooperative,      Exercises Other Exercises Other Exercises: STS x 5 reps x1 sets Other Exercises: standing heel raises x10 bilaterally Other Exercises: seated BLE AROM: ankle pumps, LAQ, hip flexion 1x10 reps    General Comments  General comments (skin integrity, edema, etc.): VSS but urinary incontinence noted, pt unable to assist with posterior peri-care      Pertinent Vitals/Pain  Pain Assessment: No/denies pain Faces Pain Scale: Hurts little more Pain Location: R lateral elbow discomfort, tailbone "sore" Pain Descriptors / Indicators: Discomfort;Sore Pain Intervention(s): Monitored during session;Repositioned    Home Living   Living Arrangements: Alone                      PT Goals (current goals can now be found in the care plan section) Acute Rehab PT Goals Patient Stated Goal: to get back to taking care of myself PT Goal Formulation: With patient Time For Goal Achievement: 09/26/20 Potential to Achieve Goals: Good Progress towards PT goals: Progressing toward goals    Frequency    Min 3X/week      PT Plan Current plan remains appropriate       AM-PAC PT "6 Clicks" Mobility   Outcome Measure  Help needed turning from your back to your side while in a flat bed without using bedrails?: None Help needed moving from lying on your back to sitting on the side of a flat bed without using bedrails?: None Help needed moving to and from a bed to a chair (including a wheelchair)?: A Little Help needed standing up from a chair using your arms (e.g., wheelchair or bedside chair)?: A Little Help needed to walk in hospital room?: A Little Help needed climbing 3-5 steps with a railing? : A Little 6 Click Score: 20    End of Session Equipment Utilized During Treatment: Gait belt Activity Tolerance: Patient tolerated treatment well Patient left: with call bell/phone within reach;in chair;with chair alarm set Nurse Communication: Mobility status PT Visit Diagnosis: Unsteadiness on feet (R26.81);Other abnormalities of gait and mobility (R26.89)     Time: 6712-4580 PT Time Calculation (min) (ACUTE ONLY): 19 min  Charges:  $Therapeutic Exercise: 8-22 mins $Therapeutic Activity: 8-22 mins                     Mirka Barbone P., PTA Acute Rehabilitation Services Pager: [phone] Office: [place] 09/14/2020, 5:08 PM

## 2020-09-14 NOTE — Progress Notes (Signed)
Physical Therapy Treatment Patient Details Name: David Burton MRN: [id number] DOB: 08/20/1945 Today's Date: 09/14/2020    History of Present Illness David Burton is a 75 y/o male who reported to ED due to a tongue injury following oral surgery. Pt admitted on 09/11/20 with acute renal failure. PMH includes DM, prior CVA (July 2020), HTN, CKD, MI, Bell's Palsy, and CAD    PT Comments    Pt received in supine, agreeable to therapy session and with good participation and tolerance for gait and transfer training and seated/standing BLE exercises for strengthening. Focus on progressing safety with RW use and progressing standing/gait tolerance for BLE strengthening along with serial transfer training for strengthening, pt reports 5-6/10 modified RPE (fatigue) at end of session. Pt up to Supervision for safety with transfers and gait with assistive device. Pt continues to benefit from PT services to progress toward functional mobility goals. Continue to recommend HHPT.   Follow Up Recommendations  Home health PT;Supervision - Intermittent     Equipment Recommendations  None recommended by PT    Recommendations for Other Services       Precautions / Restrictions Precautions Precautions: Fall Restrictions Weight Bearing Restrictions: No    Mobility  Bed Mobility Overal bed mobility: Modified Independent Bed Mobility: Supine to Sit;Sit to Supine     Supine to sit: Modified independent (Device/Increase time);HOB elevated Sit to supine: Modified independent (Device/Increase time)   General bed mobility comments: Incr time and effort from flat HOB no rail    Transfers Overall transfer level: Needs assistance Equipment used: None Transfers: Sit to/from Stand Sit to Stand: Modified independent (Device/Increase time);Supervision         General transfer comment: from EOB with supervision/cues for hand placement, then modI x10 reps with fair carryover of initial  instruction  Ambulation/Gait Ambulation/Gait assistance: Supervision Gait Distance (Feet): 200 Feet Assistive device: Rolling walker (2 wheeled) Gait Pattern/deviations: Step-through pattern;Decreased stride length;Drifts right/left Gait velocity: decr   General Gait Details: cues for posture intermittently but fair use of assistive device; c/o R lateral elbow discomfort, RN notified   Stairs             Wheelchair Mobility    Modified Rankin (Stroke Patients Only)       Balance Overall balance assessment: Needs assistance Sitting-balance support: No upper extremity supported;Feet supported Sitting balance-Leahy Scale: Good     Standing balance support: No upper extremity supported Standing balance-Leahy Scale: Fair Standing balance comment: able to stand at bedside unsupported but used RW for longer distances for improved stability/energy conservation                            Cognition Arousal/Alertness: Awake/alert Behavior During Therapy: WFL for tasks assessed/performed Overall Cognitive Status: Within Functional Limits for tasks assessed Area of Impairment: Problem solving                             Problem Solving: Slow processing;Requires verbal cues General Comments: Pt A/O x4; following all commands but initially increased time to process some 1-step commands      Exercises Other Exercises Other Exercises: STS x 5 reps x2 sets Other Exercises: standing heel raises x10 bilaterally Other Exercises: seated BLE AROM: ankle pumps, LAQ, hip flexion 1x10 reps    General Comments General comments (skin integrity, edema, etc.): Elevated HR to 115 bpm with exertion otherwise VSS on RA  Pertinent Vitals/Pain Pain Assessment: Faces Faces Pain Scale: Hurts little more Pain Location: R lateral elbow discomfort, tailbone "sore" Pain Descriptors / Indicators: Discomfort;Sore Pain Intervention(s): Monitored during  session;Repositioned (encouraged pt to remain up in chair at end but he refused, placed bed partially into chair position)    Home Living                      Prior Function            PT Goals (current goals can now be found in the care plan section) Acute Rehab PT Goals Patient Stated Goal: to get back to taking care of myself PT Goal Formulation: With patient Time For Goal Achievement: 09/26/20 Potential to Achieve Goals: Good Progress towards PT goals: Progressing toward goals    Frequency    Min 3X/week      PT Plan Current plan remains appropriate    Co-evaluation              AM-PAC PT "6 Clicks" Mobility   Outcome Measure  Help needed turning from your back to your side while in a flat bed without using bedrails?: None Help needed moving from lying on your back to sitting on the side of a flat bed without using bedrails?: None Help needed moving to and from a bed to a chair (including a wheelchair)?: A Little Help needed standing up from a chair using your arms (e.g., wheelchair or bedside chair)?: None Help needed to walk in hospital room?: A Little Help needed climbing 3-5 steps with a railing? : A Little 6 Click Score: 21    End of Session Equipment Utilized During Treatment: Gait belt Activity Tolerance: Patient tolerated treatment well Patient left: in bed;with call bell/phone within reach;with bed alarm set (refusing to sit in chair) Nurse Communication: Mobility status PT Visit Diagnosis: Unsteadiness on feet (R26.81);Other abnormalities of gait and mobility (R26.89)     Time: 6314-9702 PT Time Calculation (min) (ACUTE ONLY): 21 min  Charges:  $Therapeutic Exercise: 8-22 mins                     Gatsby Chismar P., PTA Acute Rehabilitation Services Pager: [phone] Office: [place] 09/14/2020, 2:18 PM

## 2020-09-15 ENCOUNTER — Other Ambulatory Visit (HOSPITAL_COMMUNITY): Payer: Self-pay

## 2020-09-15 LAB — COMPREHENSIVE METABOLIC PANEL
ALT: 30 U/L (ref 0–44)
AST: 25 U/L (ref 15–41)
Albumin: 3.1 g/dL — ABNORMAL LOW (ref 3.5–5.0)
Alkaline Phosphatase: 58 U/L (ref 38–126)
Anion gap: 8 (ref 5–15)
BUN: 24 mg/dL — ABNORMAL HIGH (ref 8–23)
CO2: 17 mmol/L — ABNORMAL LOW (ref 22–32)
Calcium: 9.1 mg/dL (ref 8.9–10.3)
Chloride: 113 mmol/L — ABNORMAL HIGH (ref 98–111)
Creatinine, Ser: 1.43 mg/dL — ABNORMAL HIGH (ref 0.61–1.24)
GFR, Estimated: 52 mL/min — ABNORMAL LOW (ref >60)
Glucose, Bld: 175 mg/dL — ABNORMAL HIGH (ref 70–99)
Potassium: 3.7 mmol/L (ref 3.5–5.1)
Sodium: 138 mmol/L (ref 135–145)
Total Bilirubin: 0.7 mg/dL (ref 0.3–1.2)
Total Protein: 7 g/dL (ref 6.5–8.1)

## 2020-09-15 LAB — GLUCOSE, CAPILLARY
Glucose-Capillary: 199 mg/dL — ABNORMAL HIGH (ref 70–99)
Glucose-Capillary: 152 mg/dL — ABNORMAL HIGH (ref 70–99)
Glucose-Capillary: 236 mg/dL — ABNORMAL HIGH (ref 70–99)

## 2020-09-15 LAB — PHOSPHORUS: Phosphorus: 2.4 mg/dL — ABNORMAL LOW (ref 2.5–4.6)

## 2020-09-15 LAB — CBC WITH DIFFERENTIAL/PLATELET
Abs Immature Granulocytes: 0.24 10*3/uL — ABNORMAL HIGH (ref 0.00–0.07)
Basophils Absolute: 0.1 10*3/uL (ref 0.0–0.1)
Basophils Relative: 1 %
Eosinophils Absolute: 0.3 10*3/uL (ref 0.0–0.5)
Eosinophils Relative: 3 %
HCT: 35 % — ABNORMAL LOW (ref 39.0–52.0)
Hemoglobin: 12.4 g/dL — ABNORMAL LOW (ref 13.0–17.0)
Immature Granulocytes: 2 %
Lymphocytes Relative: 24 %
Lymphs Abs: 2.6 10*3/uL (ref 0.7–4.0)
MCH: 31.2 pg (ref 26.0–34.0)
MCHC: 35.4 g/dL (ref 30.0–36.0)
MCV: 88.2 fL (ref 80.0–100.0)
Monocytes Absolute: 1.3 10*3/uL — ABNORMAL HIGH (ref 0.1–1.0)
Monocytes Relative: 12 %
Neutro Abs: 6 10*3/uL (ref 1.7–7.7)
Neutrophils Relative %: 58 %
Platelets: 178 10*3/uL (ref 150–400)
RBC: 3.97 MIL/uL — ABNORMAL LOW (ref 4.22–5.81)
RDW: 15 % (ref 11.5–15.5)
WBC: 10.5 10*3/uL (ref 4.0–10.5)
nRBC: 0.3 % — ABNORMAL HIGH (ref 0.0–0.2)

## 2020-09-15 LAB — MAGNESIUM: Magnesium: 1.9 mg/dL (ref 1.7–2.4)

## 2020-09-15 MED ORDER — INSULIN ASPART 100 UNIT/ML ~~LOC~~ SOLN
8.0000 [IU] | Freq: Three times a day (TID) | SUBCUTANEOUS | Status: DC
  Administered 2020-09-15: 8 [IU] via SUBCUTANEOUS

## 2020-09-15 MED ORDER — INSULIN GLARGINE 100 UNIT/ML ~~LOC~~ SOLN: 10.0000 [IU] | Freq: Every day | SUBCUTANEOUS | Status: DC

## 2020-09-15 MED ORDER — METOPROLOL TARTRATE 25 MG PO TABS
12.5000 mg | ORAL_TABLET | Freq: Two times a day (BID) | ORAL | 0 refills | Status: DC
  Filled 2020-09-15: qty 30, 30d supply, fill #0

## 2020-09-15 MED ORDER — PHENOL 1.4 % MT LIQD
1.0000 | OROMUCOSAL | 0 refills | Status: DC | PRN
  Filled 2020-09-15: qty 20, fill #0

## 2020-09-15 MED ORDER — MAGIC MOUTHWASH W/LIDOCAINE: 2.0000 mL | Freq: Three times a day (TID) | ORAL | 0 refills | Status: DC | PRN

## 2020-09-15 MED ORDER — AMLODIPINE BESYLATE 10 MG PO TABS
10.0000 mg | ORAL_TABLET | Freq: Every day | ORAL | 0 refills | Status: DC
  Filled 2020-09-15: qty 30, 30d supply, fill #0

## 2020-09-15 MED ORDER — POTASSIUM PHOSPHATES 15 MMOLE/5ML IV SOLN
20.0000 mmol | Freq: Once | INTRAVENOUS | Status: AC
  Administered 2020-09-15: 20 mmol via INTRAVENOUS
  Filled 2020-09-15: qty 6.67

## 2020-09-15 MED ORDER — ATORVASTATIN CALCIUM 20 MG PO TABS
20.0000 mg | ORAL_TABLET | Freq: Every day | ORAL | 0 refills | Status: DC
  Filled 2020-09-15: qty 30, 30d supply, fill #0

## 2020-09-15 MED ORDER — EZETIMIBE 10 MG PO TABS
10.0000 mg | ORAL_TABLET | Freq: Every day | ORAL | 0 refills | Status: DC
  Filled 2020-09-15: qty 30, 30d supply, fill #0

## 2020-09-15 NOTE — Plan of Care (Signed)
  Problem: Education: Goal: Knowledge of disease and its progression will improve 09/15/2020 1720 by Emmaline Life, RN Outcome: Adequate for Discharge 09/15/2020 1102 by Emmaline Life, RN Outcome: Progressing   Problem: Health Behavior/Discharge Planning: Goal: Ability to manage health-related needs will improve 09/15/2020 1720 by Emmaline Life, RN Outcome: Adequate for Discharge 09/15/2020 1102 by Emmaline Life, RN Outcome: Progressing   Problem: Clinical Measurements: Goal: Complications related to the disease process or treatment will be avoided or minimized 09/15/2020 1720 by Emmaline Life, RN Outcome: Adequate for Discharge 09/15/2020 1102 by Emmaline Life, RN Outcome: Progressing Goal: Dialysis access will remain free of complications Outcome: Adequate for Discharge   Problem: Activity: Goal: Activity intolerance will improve Outcome: Adequate for Discharge   Problem: Fluid Volume: Goal: Fluid volume balance will be maintained or improved Outcome: Adequate for Discharge   Problem: Nutritional: Goal: Ability to make appropriate dietary choices will improve Outcome: Adequate for Discharge   Problem: Respiratory: Goal: Respiratory symptoms related to disease process will be avoided Outcome: Adequate for Discharge   Problem: Self-Concept: Goal: Body image disturbance will be avoided or minimized Outcome: Adequate for Discharge   Problem: Urinary Elimination: Goal: Progression of disease will be identified and treated Outcome: Adequate for Discharge   Problem: Education: Goal: Knowledge of General Education information will improve Description: Including pain rating scale, medication(s)/side effects and non-pharmacologic comfort measures Outcome: Adequate for Discharge   Problem: Health Behavior/Discharge Planning: Goal: Ability to manage health-related needs will improve Outcome: Adequate for Discharge   Problem: Clinical  Measurements: Goal: Ability to maintain clinical measurements within normal limits will improve Outcome: Adequate for Discharge Goal: Will remain free from infection Outcome: Adequate for Discharge Goal: Diagnostic test results will improve Outcome: Adequate for Discharge Goal: Respiratory complications will improve Outcome: Adequate for Discharge Goal: Cardiovascular complication will be avoided Outcome: Adequate for Discharge   Problem: Activity: Goal: Risk for activity intolerance will decrease Outcome: Adequate for Discharge   Problem: Nutrition: Goal: Adequate nutrition will be maintained Outcome: Adequate for Discharge   Problem: Coping: Goal: Level of anxiety will decrease Outcome: Adequate for Discharge   Problem: Elimination: Goal: Will not experience complications related to bowel motility Outcome: Adequate for Discharge Goal: Will not experience complications related to urinary retention Outcome: Adequate for Discharge   Problem: Pain Managment: Goal: General experience of comfort will improve Outcome: Adequate for Discharge   Problem: Safety: Goal: Ability to remain free from injury will improve Outcome: Adequate for Discharge   Problem: Skin Integrity: Goal: Risk for impaired skin integrity will decrease Outcome: Adequate for Discharge

## 2020-09-15 NOTE — Discharge Summary (Signed)
Physician Discharge Summary  David Burton TIR:[id number] DOB: 05/10/1946 DOA: 09/11/2020  PCP: David Hughs, NP  Admit date: 09/11/2020 Discharge date: 09/15/2020  Time spent: 35 minutes  Recommendations for Outpatient Follow-up:   Covid vaccination; vaccinated 2/3   Acute on CKD stage IIIb (baselineCr 1.9--- 2.3)  Lab Results  Component Value Date   CREATININE 1.43 (H) 09/15/2020   CREATININE 1.62 (H) 09/14/2020   CREATININE 1.81 (H) 09/13/2020   CREATININE 2.70 (H) 09/12/2020   CREATININE 3.50 (H) 09/11/2020  -Better than baseline  Anion gap metabolic acidosis -Resolved  Essential HTN -Amlodipine 10 mg daily -Imdur 60 mg daily -Metoprolol 12.5 mg BID  CAD -Asymptomatic  DM type II uncontrolled with hyperglycemia -4/6 hemoglobin A1c= 8.1 -Restart patient's home Levemir 20 units qhs -Restart Humalog KwikPen qac  HLD -4/14 LDL= 91: Goal<70 -4/14 Lipitor 20 mg daily  Hypokalemia - Potassium goal> 4 - K-Phos 20 mmol prior to discharge  Hypophosphatemia - Phosphorus goal> 2.5 - See hypokalemia  Hypomagnesmia - Magnesium goal> 2   Dental/ toungue injury.  -Resolved -Po intake has improved, continue pain control with oxycodone. -Follow with dental surgery as outpatient.  Per daughter David Burton patient has appointment next week   Morbidly obese (BMI 37.46 kg/m)   Discharge Diagnoses:  Principal Problem:   Acute renal failure superimposed on chronic kidney disease ([place]) Active Problems:   Essential hypertension   CAD (coronary artery disease), native coronary artery   Type 2 diabetes, controlled, with peripheral neuropathy (HCC)   Generalized weakness   Metabolic acidosis   Mouth pain   DNR (do not resuscitate)   Obese   Morbidly obese ([place])   Discharge Condition: Stable  Diet recommendation: Carb modified  Filed Weights   09/11/20 1756  Weight: 108.5 kg    History of present illness:  [name] was admitted to the hospital with  a working diagnosis of acute kidney injury on chronic kidney disease stage IIIb.  75 year old BM PMHx hypertension, dyslipidemia, coronary artery disease, CVA, type 2 diabetes mellitus and chronic kidney disease stage IIIb   Presented with decreased p.o. intake. Patient sustained a tongue injury after a dental procedure last week, with significant decrease in his p.o. intakefor the last 4 days. He has been taking ibuprofen 200 mg tablets over the last 3 weeksfor tooth ache, plan for tooth extraction this week. Hissymptoms have beenassociated with generalized weakness but no fever or chills. On his initial physical examination blood pressure 113/69, heart rate 94, respiratory rate 16, oxygen saturation 100%, his lungs are clear to auscultation bilaterally, heart S1-S2, present rhythm, soft abdomen, no lower extremity edema.  Sodium 134, potassium 3.2, chloride 110, bicarb 8, glucose 167, BUN 111, creatinine 4.0, anion gap 16. White count 10.5, hemoglobin 16.5, hematocrit 46.9, platelets 178.  SARS COVID-19 negative.  Patient was placed on IV bicarbonate for hydrationwith improvement in renal function.    Hospital Course:  See above   Antimicrobials:    Discharge Exam: Vitals:   09/14/20 1244 09/14/20 1603 09/14/20 2309 09/15/20 0456  BP: (!) 141/69 (!) 149/76 (!) 145/73 136/76  Pulse: 81 87 89 85  Resp: 19 17 19 18   Temp: 99.4 F (37.4 C) 98.2 F (36.8 C) 99.9 F (37.7 C) 99.5 F (37.5 C)  TempSrc: Oral Oral Oral Oral  SpO2: 100% 100% 99% 99%  Weight:      Height:        General: A/O x4, No acute respiratory distress Eyes: negative scleral hemorrhage, negative anisocoria, negative  icterus ENT: Negative Runny nose, negative gingival bleeding, Neck:  Negative scars, masses, torticollis, lymphadenopathy, JVD Lungs: Clear to auscultation bilaterally without wheezes or crackles Cardiovascular: Regular rate and rhythm without murmur gallop or rub normal S1 and  S2 Abdomen: MORBIDLY OBESE, negative abdominal pain, nondistended, positive soft, bowel sounds, no rebound, no ascites, no appreciable mass   Discharge Instructions   Allergies as of 09/15/2020   No Known Allergies     Medication List    STOP taking these medications   amoxicillin-clavulanate 875-125 MG tablet Commonly known as: AUGMENTIN   calamine lotion   Debrox 6.5 % OTIC solution Generic drug: carbamide peroxide   losartan 25 MG tablet Commonly known as: COZAAR   Trulicity 9.62 XB/2.8UX Sopn Generic drug: Dulaglutide     TAKE these medications   Accu-Chek Aviva Plus test strip Generic drug: glucose blood Use to test blood sugar 3 times daily. Dx: E11.42   amLODipine 10 MG tablet Commonly known as: NORVASC Take 1 tablet (10 mg total) by mouth daily. Start taking on: September 16, 2020 What changed:   medication strength  See the new instructions.   atorvastatin 10 MG tablet Commonly known as: LIPITOR Take 2 tablets (20 mg total) by mouth daily. Start taking on: September 16, 2020   BD Pen Needle Nano 2nd Gen 32G X 4 MM Misc Generic drug: Insulin Pen Needle USE 1 PEN THREE TIMES DAILY AS DIRECTED   bisacodyl 10 MG suppository Commonly known as: DULCOLAX Place 10 mg rectally daily as needed for moderate constipation.   clopidogrel 75 MG tablet Commonly known as: Plavix Take 1 tablet (75 mg total) by mouth daily.   ezetimibe 10 MG tablet Commonly known as: ZETIA Take 1 tablet (10 mg total) by mouth daily. Start taking on: September 16, 2020 What changed: See the new instructions.   HYDROcodone-acetaminophen 10-325 MG tablet Commonly known as: NORCO Take 1 tablet by mouth 3 (three) times daily as needed.   insulin detemir 100 UNIT/ML FlexPen Commonly known as: LEVEMIR Inject 20 Units into the skin at bedtime.   insulin lispro 100 UNIT/ML KwikPen Commonly known as: HumaLOG KwikPen Inject 8-10 units into the skin per sliding scale three times daily with  meals.   isosorbide mononitrate 60 MG 24 hr tablet Commonly known as: IMDUR Take 1 tablet (60 mg total) by mouth daily.   magic mouthwash w/lidocaine Soln Take 2 mLs by mouth 3 (three) times daily as needed for mouth pain.   metoprolol tartrate 25 MG tablet Commonly known as: LOPRESSOR Take 0.5 tablets (12.5 mg total) by mouth 2 (two) times daily.   nitroGLYCERIN 0.4 MG SL tablet Commonly known as: NITROSTAT Dissolve one tablet under tongue as needed for chest pain. What changed:   how much to take  how to take this  when to take this  reasons to take this  additional instructions   phenol 1.4 % Liqd Commonly known as: CHLORASEPTIC Use as directed 1 spray in the mouth or throat as needed for throat irritation / pain.   Repatha 140 MG/ML Sosy Generic drug: Evolocumab Inject 140 mg into the skin every 14 (fourteen) days.   saccharomyces boulardii 250 MG capsule Commonly known as: Florastor Take 1 capsule (250 mg total) by mouth 2 (two) times daily.   senna 8.6 MG Tabs tablet Commonly known as: SENOKOT Take 1 tablet (8.6 mg total) by mouth daily.      No Known Allergies  Follow-up Information    Your physician In  2 days.        Care, [facility] Follow up.   Specialty: Home Health Services Contact information: [address] [place] [address] [phone]                The results of significant diagnostics from this hospitalization (including imaging, microbiology, ancillary and laboratory) are listed below for reference.    Significant Diagnostic Studies: US RENAL  Result Date: 09/12/2020 CLINICAL DATA:  Renal failure EXAM: RENAL / URINARY TRACT ULTRASOUND COMPLETE COMPARISON:  06/16/2018 FINDINGS: Right Kidney: Renal measurements: 11.1 x 5.2 x 4.9 cm = volume: 148.6 mL. Echogenicity within normal limits. No mass or hydronephrosis visualized. Left Kidney: Renal measurements: 9.6 x 6.0 x 4.8 cm = volume: 143.7 mL.  Echogenicity within normal limits. No mass or hydronephrosis visualized. Bladder: Appears normal for degree of bladder distention. Other: Enlarged median lobe of the prostate is identified, unchanged since prior CT. IMPRESSION: 1. Grossly unremarkable renal ultrasound. Electronically Signed   By: Randa Ngo M.D.   On: 09/12/2020 21:01    Microbiology: Recent Results (from the past 240 hour(s))  SARS CORONAVIRUS 2 (TAT 6-24 HRS) Nasopharyngeal Nasopharyngeal Swab     Status: None   Collection Time: 09/11/20  2:12 PM   Specimen: Nasopharyngeal Swab  Result Value Ref Range Status   SARS Coronavirus 2 NEGATIVE NEGATIVE Final    Comment: (NOTE) SARS-CoV-2 target nucleic acids are NOT DETECTED.  The SARS-CoV-2 RNA is generally detectable in upper and lower respiratory specimens during the acute phase of infection. Negative results do not preclude SARS-CoV-2 infection, do not rule out co-infections with other pathogens, and should not be used as the sole basis for treatment or other patient management decisions. Negative results must be combined with clinical observations, patient history, and epidemiological information. The expected result is Negative.  Fact Sheet for Patients: SugarRoll.be  Fact Sheet for Healthcare Providers: https://www.-mathews.com/  This test is not yet approved or cleared by the [place] FDA and  has been authorized for detection and/or diagnosis of SARS-CoV-2 by FDA under an Emergency Use Authorization (EUA). This EUA will remain  in effect (meaning this test can be used) for the duration of the COVID-19 declaration under Se ction 564(b)(1) of the Act, 21 U.S.C. section 360bbb-3(b)(1), unless the authorization is terminated or revoked sooner.  Performed at [facility], [place] [address], [place], [place] [address]      Labs: Basic Metabolic Panel: Recent Labs  Lab 09/11/20 1019 09/11/20 1355  09/12/20 0203 09/13/20 0717 09/13/20 1630 09/14/20 0414 09/15/20 0147  NA 134* 137 133* 138  --  140 138  K 3.2* 3.9 2.5* 2.8* 3.7 3.4* 3.7  CL 110 114* 110 111  --  114* 113*  CO2 8*  --  13* 18*  --  18* 17*  GLUCOSE 167* 157* 130* 169*  --  176* 175*  BUN 111* 112* 93* 56*  --  38* 24*  CREATININE 4.05* 3.50* 2.70* 1.81*  --  1.62* 1.43*  CALCIUM 8.5*  --  7.9* 8.8*  --  8.9 9.1  MG  --   --  2.3 2.1 2.2 1.9 1.9  PHOS  --   --   --   --   --  1.6* 2.4*   Liver Function Tests: Recent Labs  Lab 09/11/20 1019 09/14/20 0414 09/15/20 0147  AST 17 19 25   ALT 16 21 30   ALKPHOS 75 53 58  BILITOT 0.9 0.6 0.7  PROT 8.0 6.4* 7.0  ALBUMIN 3.8 2.9* 3.1*   No results for input(s): LIPASE, AMYLASE in the last 168 hours. No results for input(s): AMMONIA in the last 168 hours. CBC: Recent Labs  Lab 09/11/20 1019 09/11/20 1355 09/12/20 0203 09/14/20 0414 09/15/20 0147  WBC 10.5  --  8.3 8.9 10.5  NEUTROABS 6.6  --   --  5.3 6.0  HGB 16.5 13.9 13.0 12.0* 12.4*  HCT 46.9 41.0 35.3* 33.5* 35.0*  MCV 87.8  --  85.3 88.4 88.2  PLT 178  --  155 156 178   Cardiac Enzymes: No results for input(s): CKTOTAL, CKMB, CKMBINDEX, TROPONINI in the last 168 hours. BNP: BNP (last 3 results) No results for input(s): BNP in the last 8760 hours.  ProBNP (last 3 results) No results for input(s): PROBNP in the last 8760 hours.  CBG: Recent Labs  Lab 09/14/20 0620 09/14/20 1044 09/14/20 1605 09/14/20 2059 09/15/20 0609  GLUCAP 179* 209* 255* 233* 199*       Signed:  Dia Crawford, MD [facility]

## 2020-09-15 NOTE — Care Management Important Message (Signed)
Important Message  Patient Details  Name: David Burton MRN: [id number] Date of Birth: [birth year]   Medicare Important Message Given:  Yes - Important Message mailed due to current National Emergency  Verbal consent obtained due to current National Emergency  Relationship to patient: Self Contact Name: Lazar Call Date: 09/15/20  Time: 1217 Phone: [phone] Outcome: No Answer/Busy Important Message mailed to: Patient address on file    Delorse Lek 09/15/2020, 12:17 PM

## 2020-09-15 NOTE — Plan of Care (Signed)
  Problem: Education: Goal: Knowledge of disease and its progression will improve Outcome: Progressing   Problem: Health Behavior/Discharge Planning: Goal: Ability to manage health-related needs will improve Outcome: Progressing   Problem: Clinical Measurements: Goal: Complications related to the disease process or treatment will be avoided or minimized Outcome: Progressing

## 2020-09-15 NOTE — Progress Notes (Signed)
Discharge instruction, RXs and follow up appts explained and provided to patient  And daughter verbalized understanding. Patient left floor via wheelchair accompanied by staff no c/o pain at d/c.   Ajai Terhaar, Tivis Ringer, RN

## 2020-09-18 ENCOUNTER — Telehealth: Payer: Self-pay | Admitting: Pharmacist

## 2020-09-18 ENCOUNTER — Other Ambulatory Visit: Payer: Self-pay | Admitting: *Deleted

## 2020-09-18 DIAGNOSIS — Z79899 Other long term (current) drug therapy: Secondary | ICD-10-CM

## 2020-09-18 DIAGNOSIS — E860 Dehydration: Secondary | ICD-10-CM

## 2020-09-18 NOTE — Patient Outreach (Signed)
[name]) Care Management  [place]  09/18/2020   [name] 03/28/1946 [id number]    Referral Date: 4/8 Referral Source: MD office Referral Reason: medication management, need for ADL assistance  Insurance: [facility]   Outreach attempt #1, successful.  Identity verified.  This care manager introduced self and stated purpose of call.  [facility] care management services explained.    Social: Member report he lives alone, has 2 daughters that help when needed.  State he is in need of assistance with bathing, dressing, cooking, and cleaning.  He was recently admitted to hospital for dehydration, when discharged had home health ordered for PT and RN.  State they have not contacted him yet for evaluation, using walker for mobility until he regains his strength.  Call was placed to [facility], state they reached out to him with no answer, will call again today.    Request this care manager call daughter [name], state she is the one that usually manages health conditions.  She report there is concern with member being in the home alone, but also state he was doing well until he started having issues with his teeth and unable to eat.  He will have teeth removed on Wednesday, feel once this is done he will be able to regain his strength and ability to continue caring for himself.  In the meantime, she does agree that he is in need of in home assistance and meal support.  State he does have Medicaid, unsure if he has full benefits.  Conditions: Per chart, has history of HTN, CAD, GERD, DM, CKD, HLD, and stroke.  Both member and daughter report member is to have someone come in to perform eye exam in the home, daughter state he is active with [facility].  Medications: Daughter report he is taking all medications as instructed as she and her sister are managing his meds however unable to confirm list as she is not in the home with him currently.  Appointments: She report member was seen by  his PCP 3 days prior to hospitalization.  No follow up appointment scheduled yet, state she does not feel he need one yet.  Report if he does not get better after having oral surgery she will schedule one.     Encounter Medications:  Outpatient Encounter Medications as of 09/18/2020  Medication Sig Note  . amLODipine (NORVASC) 10 MG tablet Take 1 tablet (10 mg total) by mouth daily.   Marland Kitchen atorvastatin (LIPITOR) 20 MG tablet Take 1 tablet (20 mg total) by mouth daily.   . BD PEN NEEDLE NANO 2ND GEN 32G X 4 MM MISC USE 1 PEN THREE TIMES DAILY AS DIRECTED   . bisacodyl (DULCOLAX) 10 MG suppository Place 10 mg rectally daily as needed for moderate constipation.   . clopidogrel (PLAVIX) 75 MG tablet Take 1 tablet (75 mg total) by mouth daily. 09/11/2020: Currently on hold  . ezetimibe (ZETIA) 10 MG tablet Take 1 tablet (10 mg total) by mouth daily.   Marland Kitchen glucose blood (ACCU-CHEK AVIVA PLUS) test strip Use to test blood sugar 3 times daily. Dx: E11.42   . HYDROcodone-acetaminophen (NORCO) 10-325 MG tablet Take 1 tablet by mouth 3 (three) times daily as needed.   . insulin detemir (LEVEMIR) 100 UNIT/ML FlexPen Inject 20 Units into the skin at bedtime.   . insulin lispro (HUMALOG KWIKPEN) 100 UNIT/ML KwikPen Inject 8-10 units into the skin per sliding scale three times daily with meals.   . isosorbide mononitrate (  IMDUR) 60 MG 24 hr tablet Take 1 tablet (60 mg total) by mouth daily.   . magic mouthwash w/lidocaine SOLN Take 2 mLs by mouth 3 (three) times daily as needed for mouth pain.   . metoprolol tartrate (LOPRESSOR) 25 MG tablet Take 0.5 tablets (12.5 mg total) by mouth 2 (two) times daily.   . nitroGLYCERIN (NITROSTAT) 0.4 MG SL tablet Dissolve one tablet under tongue as needed for chest pain. (Patient taking differently: Place 0.4 mg under the tongue every 5 (five) minutes as needed for chest pain.)   . phenol (CHLORASEPTIC) 1.4 % LIQD Use as directed 1 spray in the mouth or throat as needed for throat  irritation / pain.   Marland Kitchen REPATHA 140 MG/ML SOSY Inject 140 mg into the skin every 14 (fourteen) days.   Marland Kitchen saccharomyces boulardii (FLORASTOR) 250 MG capsule Take 1 capsule (250 mg total) by mouth 2 (two) times daily.   Marland Kitchen senna (SENOKOT) 8.6 MG TABS tablet Take 1 tablet (8.6 mg total) by mouth daily. (Patient not taking: Reported on 09/11/2020)    No facility-administered encounter medications on file as of 09/18/2020.    Functional Status:  In your present state of health, do you have any difficulty performing the following activities: 09/14/2020 09/11/2020  Hearing? - Y  Vision? - N  Difficulty concentrating or making decisions? - N  Walking or climbing stairs? - N  Dressing or bathing? - N  Doing errands, shopping? N -  Some recent data might be hidden    Fall/Depression Screening: Fall Risk  09/08/2020 08/30/2020 06/26/2020  Falls in the past year? 0 0 0  Number falls in past yr: 0 0 0  Injury with Fall? 0 0 0   No flowsheet data found.   Plan:  Follow-up:  Will check eligibility as member is reportedly active with [facility].  If member is eligible, will complete individualized plan of care.  Will also place referrals to CSW and Care Guide and follow up within the next 2 weeks.  [name], [place], MSN [place] [phone]

## 2020-09-18 NOTE — Progress Notes (Signed)
[facility])                                            [place] Team                                        Statin Quality Measure Assessment    09/18/2020  [name] [birth year] [id number]  Patient was called because he was on the [facility] list as having a diagnosis of diabetes but is not currently filling a statin prescription. (He was previously on Rosuvastatin but it had not been filled since 04/2020)  This placed patient into the SUPD (Statin Use In Patients with Diabetes) measure for CMS (prior to the most recent discharge).  Per chart review, Atorvastatin 20 mg was started at the most recent discharge.  To close the SUPD measure, the patient will need to get a 90 day supply of a statin filled.  Spoke with Patient and his Daughter, Karna Christmas. Terri confirmed the patient is taking Atorvastatin and that she filled his pill box for the next two weeks.  To close the SUPD measure, Patient will need to have a 90 day fill of a statin.  Will follow up to be sure future refills are picked up.  Elayne Guerin, PharmD, [place] Clinical Pharmacist [phone]

## 2020-09-19 ENCOUNTER — Other Ambulatory Visit: Payer: Self-pay | Admitting: Family

## 2020-09-19 DIAGNOSIS — E876 Hypokalemia: Secondary | ICD-10-CM | POA: Diagnosis not present

## 2020-09-19 DIAGNOSIS — Z7902 Long term (current) use of antithrombotics/antiplatelets: Secondary | ICD-10-CM | POA: Diagnosis not present

## 2020-09-19 DIAGNOSIS — N179 Acute kidney failure, unspecified: Secondary | ICD-10-CM | POA: Diagnosis not present

## 2020-09-19 DIAGNOSIS — Z87891 Personal history of nicotine dependence: Secondary | ICD-10-CM | POA: Diagnosis not present

## 2020-09-19 DIAGNOSIS — N1832 Chronic kidney disease, stage 3b: Secondary | ICD-10-CM | POA: Diagnosis not present

## 2020-09-19 DIAGNOSIS — I25110 Atherosclerotic heart disease of native coronary artery with unstable angina pectoris: Secondary | ICD-10-CM

## 2020-09-19 DIAGNOSIS — K219 Gastro-esophageal reflux disease without esophagitis: Secondary | ICD-10-CM | POA: Diagnosis not present

## 2020-09-19 DIAGNOSIS — G510 Bell's palsy: Secondary | ICD-10-CM | POA: Diagnosis not present

## 2020-09-19 DIAGNOSIS — I1310 Hypertensive heart and chronic kidney disease without heart failure, with stage 1 through stage 4 chronic kidney disease, or unspecified chronic kidney disease: Secondary | ICD-10-CM | POA: Diagnosis not present

## 2020-09-19 DIAGNOSIS — Z794 Long term (current) use of insulin: Secondary | ICD-10-CM | POA: Diagnosis not present

## 2020-09-19 DIAGNOSIS — Z9181 History of falling: Secondary | ICD-10-CM | POA: Diagnosis not present

## 2020-09-19 DIAGNOSIS — E1142 Type 2 diabetes mellitus with diabetic polyneuropathy: Secondary | ICD-10-CM | POA: Diagnosis not present

## 2020-09-19 DIAGNOSIS — I252 Old myocardial infarction: Secondary | ICD-10-CM | POA: Diagnosis not present

## 2020-09-19 DIAGNOSIS — E1122 Type 2 diabetes mellitus with diabetic chronic kidney disease: Secondary | ICD-10-CM | POA: Diagnosis not present

## 2020-09-19 DIAGNOSIS — E7800 Pure hypercholesterolemia, unspecified: Secondary | ICD-10-CM | POA: Diagnosis not present

## 2020-09-19 DIAGNOSIS — E785 Hyperlipidemia, unspecified: Secondary | ICD-10-CM | POA: Diagnosis not present

## 2020-09-19 DIAGNOSIS — Z955 Presence of coronary angioplasty implant and graft: Secondary | ICD-10-CM | POA: Diagnosis not present

## 2020-09-19 DIAGNOSIS — Z8673 Personal history of transient ischemic attack (TIA), and cerebral infarction without residual deficits: Secondary | ICD-10-CM

## 2020-09-19 DIAGNOSIS — I69398 Other sequelae of cerebral infarction: Secondary | ICD-10-CM | POA: Diagnosis not present

## 2020-09-20 NOTE — Patient Instructions (Signed)
Understanding Your Risk for Falls Each year, millions of people have serious injuries from falls. It is important to understand your risk for falling. Talk with your health care provider about your risk and what you can do to lower it. There are actions you can take at home to lower your risk and prevent falls. If you do have a serious fall, make sure to tell your health care provider. Falling once raises your risk of falling again. How can falls affect me? Serious injuries from falls are common. These include:  Broken bones, such as hip fractures.  Head injuries, such as traumatic brain injuries (TBI) or concussion. A fear of falling can cause you to avoid activities and stay at home. This can make your muscles weaker and actually raise your risk for a fall. What can increase my risk? There are a number of risk factors that increase your risk for falling. The more risk factors you have, the higher your risk of falling. Serious injuries from a fall happen most often to people older than age 51. Children and young adults ages 39-29 are also at higher risk. Common risk factors include:  Weakness in the lower body.  Lack (deficiency) of vitamin D.  Being generally weak or confused due to long-term (chronic) illness.  Dizziness or balance problems.  Poor vision.  Medicines that cause dizziness or drowsiness. These can include medicines for your blood pressure, heart, anxiety, insomnia, or edema, as well as pain medicines and muscle relaxants. Other risk factors include:  Drinking alcohol.  Having had a fall in the past.  Having depression.  Having foot pain or wearing improper footwear.  Working at a dangerous job.  Having any of the following in your home: ? Tripping hazards, such as floor clutter or loose rugs. ? Poor lighting. ? Pets.  Having dementia or memory loss. What actions can I take to lower my risk of falling? Physical activity Maintain physical fitness. Do  strength and balance exercises. Consider taking a regular class to build strength and balance. Yoga and tai chi are good options. Vision Have your eyes checked every year and your vision prescription updated as needed. Walking aids and footwear  Wear nonskid shoes. Do not wear high heels.  Do not walk around the house in socks or slippers.  Use a cane or walker as told by your health care provider. Home safety  Attach secure railings on both sides of your stairs.  Install grab bars for your tub, shower, and toilet. Use a bath mat in your tub or shower.  Use good lighting in all rooms. Keep a flashlight near your bed.  Make sure there is a clear path from your bed to the bathroom. Use night-lights.  Do not use throw rugs. Make sure all carpeting is taped or tacked down securely.  Remove all clutter from walkways and stairways, including extension cords.  Repair uneven or broken steps.  Avoid walking on icy or slippery surfaces. Walk on the grass instead of on icy or slick sidewalks. Use ice melt to get rid of ice on walkways.  Use a cordless phone.      Questions to ask your health care provider  Can you help me check my risk for a fall?  Do any of my medicines make me more likely to fall?  Should I take a vitamin D supplement?  What exercises can I do to improve my strength and balance?  Should I make an appointment to have my vision  checked?  Do I need a bone density test to check for weak bones or osteoporosis?  Would it help to use a cane or a walker? Where to find more information  Centers for Disease Control and Prevention, STEADI: www.cdc.gov  Community-Based Fall Prevention Programs: www.cdc.gov  National Institute on Aging: www.nia.nih.gov Contact a health care provider if:  You fall at home.  You are afraid of falling at home.  You feel weak, drowsy, or dizzy. Summary  Serious injuries from a fall happen most often to people older than age 65.  Children and young adults ages 15-29 are also at higher risk.  Talk with your health care provider about your risks for falling and how to lower those risks.  Taking certain precautions at home can lower your risk for falling.  If you fall, always tell your health care provider. This information is not intended to replace advice given to you by your health care provider. Make sure you discuss any questions you have with your health care provider. Document Revised: 12/22/2019 Document Reviewed: 12/22/2019 Elsevier Patient Education  2021 Elsevier Inc. Dehydration, Adult Dehydration is condition in which there is not enough water or other fluids in the body. This happens when a person loses more fluids than he or she takes in. Important body parts cannot work right without the right amount of fluids. Any loss of fluids from the body can cause dehydration. Dehydration can be mild, worse, or very bad. It should be treated right away to keep it from getting very bad. What are the causes? This condition may be caused by:  Conditions that cause loss of water or other fluids, such as: ? Watery poop (diarrhea). ? Vomiting. ? Sweating a lot. ? Peeing (urinating) a lot.  Not drinking enough fluids, especially when you: ? Are ill. ? Are doing things that take a lot of energy to do.  Other illnesses and conditions, such as fever or infection.  Certain medicines, such as medicines that take extra fluid out of the body (diuretics).  Lack of safe drinking water.  Not being able to get enough water and food. What increases the risk? The following factors may make you more likely to develop this condition:  Having a long-term (chronic) illness that has not been treated the right way, such as: ? Diabetes. ? Heart disease. ? Kidney disease.  Being 65 years of age or older.  Having a disability.  Living in a place that is high above the ground or sea (high in altitude). The thinner, dried  air causes more fluid loss.  Doing exercises that put stress on your body for a long time. What are the signs or symptoms? Symptoms of dehydration depend on how bad it is. Mild or worse dehydration  Thirst.  Dry lips or dry mouth.  Feeling dizzy or light-headed, especially when you stand up from sitting.  Muscle cramps.  Your body making: ? Dark pee (urine). Pee may be the color of tea. ? Less pee than normal. ? Less tears than normal.  Headache. Very bad dehydration  Changes in skin. Skin may: ? Be cold to the touch (clammy). ? Be blotchy or pale. ? Not go back to normal right after you lightly pinch it and let it go.  Little or no tears, pee, or sweat.  Changes in vital signs, such as: ? Fast breathing. ? Low blood pressure. ? Weak pulse. ? Pulse that is more than 100 beats a minute when you are sitting   still.  Other changes, such as: ? Feeling very thirsty. ? Eyes that look hollow (sunken). ? Cold hands and feet. ? Being mixed up (confused). ? Being very tired (lethargic) or having trouble waking from sleep. ? Short-term weight loss. ? Loss of consciousness. How is this treated? Treatment for this condition depends on how bad it is. Treatment should start right away. Do not wait until your condition gets very bad. Very bad dehydration is an emergency. You will need to go to a hospital.  Mild or worse dehydration can be treated at home. You may be asked to: ? Drink more fluids. ? Drink an oral rehydration solution (ORS). This drink helps get the right amounts of fluids and salts and minerals in the blood (electrolytes).  Very bad dehydration can be treated: ? With fluids through an IV tube. ? By getting normal levels of salts and minerals in your blood. This is often done by giving salts and minerals through a tube. The tube is passed through your nose and into your stomach. ? By treating the root cause. Follow these instructions at home: Oral rehydration  solution If told by your doctor, drink an ORS:  Make an ORS. Use instructions on the package.  Start by drinking small amounts, about  cup (120 mL) every 5-10 minutes.  Slowly drink more until you have had the amount that your doctor said to have. Eating and drinking  Drink enough clear fluid to keep your pee pale yellow. If you were told to drink an ORS, finish the ORS first. Then, start slowly drinking other clear fluids. Drink fluids such as: ? Water. Do not drink only water. Doing that can make the salt (sodium) level in your body get too low. ? Water from ice chips you suck on. ? Fruit juice that you have added water to (diluted). ? Low-calorie sports drinks.  Eat foods that have the right amounts of salts and minerals, such as: ? Bananas. ? Oranges. ? Potatoes. ? Tomatoes. ? Spinach.  Do not drink alcohol.  Avoid: ? Drinks that have a lot of sugar. These include:  High-calorie sports drinks.  Fruit juice that you did not add water to.  Soda.  Caffeine. ? Foods that are greasy or have a lot of fat or sugar.         General instructions  Take over-the-counter and prescription medicines only as told by your doctor.  Do not take salt tablets. Doing that can make the salt level in your body get too high.  Return to your normal activities as told by your doctor. Ask your doctor what activities are safe for you.  Keep all follow-up visits as told by your doctor. This is important. Contact a doctor if:  You have pain in your belly (abdomen) and the pain: ? Gets worse. ? Stays in one place.  You have a rash.  You have a stiff neck.  You get angry or annoyed (irritable) more easily than normal.  You are more tired or have a harder time waking than normal.  You feel: ? Weak or dizzy. ? Very thirsty. Get help right away if you have:  Any symptoms of very bad dehydration.  Symptoms of vomiting, such as: ? You cannot eat or drink without  vomiting. ? Your vomiting gets worse or does not go away. ? Your vomit has blood or green stuff in it.  Symptoms that get worse with treatment.  A fever.  A very bad headache.  Problems with peeing or pooping (having a bowel movement), such as: ? Watery poop that gets worse or does not go away. ? Blood in your poop (stool). This may cause poop to look black and tarry. ? Not peeing in 6-8 hours. ? Peeing only a small amount of very dark pee in 6-8 hours.  Trouble breathing. These symptoms may be an emergency. Do not wait to see if the symptoms will go away. Get medical help right away. Call your local emergency services (911 in the [place] Do not drive yourself to the hospital. Summary  Dehydration is a condition in which there is not enough water or other fluids in the body. This happens when a person loses more fluids than he or she takes in.  Treatment for this condition depends on how bad it is. Treatment should be started right away. Do not wait until your condition gets very bad.  Drink enough clear fluid to keep your pee pale yellow. If you were told to drink an oral rehydration solution (ORS), finish the ORS first. Then, start slowly drinking other clear fluids.  Take over-the-counter and prescription medicines only as told by your doctor.  Get help right away if you have any symptoms of very bad dehydration. This information is not intended to replace advice given to you by your health care provider. Make sure you discuss any questions you have with your health care provider. Document Revised: 12/31/2018 Document Reviewed: 12/31/2018 Elsevier Patient Education  2021 Elsevier Inc.  

## 2020-09-20 NOTE — Addendum Note (Signed)
Addended byValente David on: 09/20/2020 02:54 PM   Modules accepted: Orders

## 2020-09-21 ENCOUNTER — Encounter: Payer: Self-pay | Admitting: *Deleted

## 2020-09-21 ENCOUNTER — Telehealth: Payer: Self-pay

## 2020-09-21 ENCOUNTER — Other Ambulatory Visit: Payer: Self-pay | Admitting: *Deleted

## 2020-09-21 DIAGNOSIS — Z7902 Long term (current) use of antithrombotics/antiplatelets: Secondary | ICD-10-CM | POA: Diagnosis not present

## 2020-09-21 DIAGNOSIS — E7800 Pure hypercholesterolemia, unspecified: Secondary | ICD-10-CM | POA: Diagnosis not present

## 2020-09-21 DIAGNOSIS — I252 Old myocardial infarction: Secondary | ICD-10-CM | POA: Diagnosis not present

## 2020-09-21 DIAGNOSIS — I69398 Other sequelae of cerebral infarction: Secondary | ICD-10-CM | POA: Diagnosis not present

## 2020-09-21 DIAGNOSIS — N179 Acute kidney failure, unspecified: Secondary | ICD-10-CM | POA: Diagnosis not present

## 2020-09-21 DIAGNOSIS — E1142 Type 2 diabetes mellitus with diabetic polyneuropathy: Secondary | ICD-10-CM | POA: Diagnosis not present

## 2020-09-21 DIAGNOSIS — N1832 Chronic kidney disease, stage 3b: Secondary | ICD-10-CM | POA: Diagnosis not present

## 2020-09-21 DIAGNOSIS — I25110 Atherosclerotic heart disease of native coronary artery with unstable angina pectoris: Secondary | ICD-10-CM | POA: Diagnosis not present

## 2020-09-21 DIAGNOSIS — I1310 Hypertensive heart and chronic kidney disease without heart failure, with stage 1 through stage 4 chronic kidney disease, or unspecified chronic kidney disease: Secondary | ICD-10-CM | POA: Diagnosis not present

## 2020-09-21 DIAGNOSIS — E785 Hyperlipidemia, unspecified: Secondary | ICD-10-CM | POA: Diagnosis not present

## 2020-09-21 DIAGNOSIS — G510 Bell's palsy: Secondary | ICD-10-CM | POA: Diagnosis not present

## 2020-09-21 DIAGNOSIS — Z955 Presence of coronary angioplasty implant and graft: Secondary | ICD-10-CM | POA: Diagnosis not present

## 2020-09-21 DIAGNOSIS — Z794 Long term (current) use of insulin: Secondary | ICD-10-CM | POA: Diagnosis not present

## 2020-09-21 DIAGNOSIS — K219 Gastro-esophageal reflux disease without esophagitis: Secondary | ICD-10-CM | POA: Diagnosis not present

## 2020-09-21 DIAGNOSIS — Z9181 History of falling: Secondary | ICD-10-CM | POA: Diagnosis not present

## 2020-09-21 DIAGNOSIS — Z87891 Personal history of nicotine dependence: Secondary | ICD-10-CM | POA: Diagnosis not present

## 2020-09-21 DIAGNOSIS — E1122 Type 2 diabetes mellitus with diabetic chronic kidney disease: Secondary | ICD-10-CM | POA: Diagnosis not present

## 2020-09-21 DIAGNOSIS — E876 Hypokalemia: Secondary | ICD-10-CM | POA: Diagnosis not present

## 2020-09-21 NOTE — Telephone Encounter (Signed)
Entered in error Millie Knox Holdman 

## 2020-09-21 NOTE — Telephone Encounter (Signed)
   Telephone encounter was:  Successful.  09/21/2020 Name: [name] MRN: [id number] DOB: 1945-08-09  [name] is a 75 y.o. year old male who is a primary care patient of [name], [place] C, NP . The community resource team was consulted for assistance with [facility] guide performed the following interventions: Patient provided with information about care guide support team and interviewed to confirm resource needs Spoke with patient to let him know Meals on Wheels delivery will start Friday, April 22. Unable to leave message for his daughter [name] Spoke with Burton David, RN she confirmed the patient's address [address] [place], [place], [place]. Confirmed address with [name] and daughter's name and number..  Follow Up Plan:  Care guide will follow up with patient by phone over the next 7 days  [name], AAS Paralegal, [place] . Embedded Care Coordination [facility]  Care Management  [address], [place] [address] ??[name]  ?? [phone]   [email or web address]

## 2020-09-21 NOTE — Patient Outreach (Signed)
[facility]) Care Management  [facility] Social Work  09/21/2020  Shaydon Lease Jun 20, 1945 [id number]  LCSW will assist patient and daughter with obtaining Medicaid, through the [place], [employer], through [employer] of [place], and [employer], through [employer].  Encounter Medications:  Outpatient Encounter Medications as of 09/21/2020  Medication Sig Note  . isosorbide mononitrate (IMDUR) 60 MG 24 hr tablet TAKE 1 TABLET(60 MG) BY MOUTH DAILY   . amLODipine (NORVASC) 10 MG tablet Take 1 tablet (10 mg total) by mouth daily.   Marland Kitchen atorvastatin (LIPITOR) 20 MG tablet Take 1 tablet (20 mg total) by mouth daily.   . BD PEN NEEDLE NANO 2ND GEN 32G X 4 MM MISC USE 1 PEN THREE TIMES DAILY AS DIRECTED   . bisacodyl (DULCOLAX) 10 MG suppository Place 10 mg rectally daily as needed for moderate constipation.   . clopidogrel (PLAVIX) 75 MG tablet Take 1 tablet (75 mg total) by mouth daily. 09/11/2020: Currently on hold  . ezetimibe (ZETIA) 10 MG tablet Take 1 tablet (10 mg total) by mouth daily.   Marland Kitchen glucose blood (ACCU-CHEK AVIVA PLUS) test strip Use to test blood sugar 3 times daily. Dx: E11.42   . HYDROcodone-acetaminophen (NORCO) 10-325 MG tablet Take 1 tablet by mouth 3 (three) times daily as needed.   . insulin detemir (LEVEMIR) 100 UNIT/ML FlexPen Inject 20 Units into the skin at bedtime.   . insulin lispro (HUMALOG KWIKPEN) 100 UNIT/ML KwikPen Inject 8-10 units into the skin per sliding scale three times daily with meals.   . magic mouthwash w/lidocaine SOLN Take 2 mLs by mouth 3 (three) times daily as needed for mouth pain.   . metoprolol tartrate (LOPRESSOR) 25 MG tablet Take 0.5 tablets (12.5 mg total) by mouth 2 (two) times daily.   . nitroGLYCERIN (NITROSTAT) 0.4 MG SL tablet Dissolve one tablet under tongue as needed for chest pain. (Patient taking differently: Place 0.4 mg under the tongue  every 5 (five) minutes as needed for chest pain.)   . phenol (CHLORASEPTIC) 1.4 % LIQD Use as directed 1 spray in the mouth or throat as needed for throat irritation / pain.   Marland Kitchen REPATHA 140 MG/ML SOSY Inject 140 mg into the skin every 14 (fourteen) days.   Marland Kitchen saccharomyces boulardii (FLORASTOR) 250 MG capsule Take 1 capsule (250 mg total) by mouth 2 (two) times daily.   Marland Kitchen senna (SENOKOT) 8.6 MG TABS tablet Take 1 tablet (8.6 mg total) by mouth daily. (Patient not taking: Reported on 09/11/2020)    No facility-administered encounter medications on file as of 09/21/2020.    Functional Status:  In your present state of health, do you have any difficulty performing the following activities: 09/21/2020 09/14/2020  Hearing? - -  Vision? - -  Difficulty concentrating or making decisions? - -  Walking or climbing stairs? - -  Dressing or bathing? - -  Doing errands, shopping? - N  Conservation officer, nature and eating ? N -  Using the Toilet? N -  In the past six months, have you accidently leaked urine? N -  Do you have problems with loss of bowel control? N -  Managing your Medications? N -  Managing your Finances? N -  Housekeeping or managing your Housekeeping? Y -  Comment Applying for PCS. -  Some recent data might be hidden    Fall/Depression Screening:  PHQ 2/9 Scores 09/21/2020  PHQ - 2 Score 0  Assessment:  Goals Addressed              This Visit's Progress   .  Maintain My Quality of Life By Receiving [place] in the Home. (pt-stated)   On track     Timeframe:  Short-Term Goal Priority:  High Start Date:    09/21/2020                      Expected End Date:   11/01/2020   Follow Up Date:  09/28/2020 at 1:30pm.   Patient Goals/Self-Care Activities:   Review PCS (Personal Care Services) Information, Instructions, Application and PCS Agency Provider List and notify LCSW if you need assistance with application completion and/or submission.  Review 2021 Medicaid Tips  and Medicaid Application and notify LCSW if you need assistance with application completion and/or submission.  Select 2 to 3 agencies you would like to use, from the [facility] Provider List, and be prepared to share those agencies of interest with LCSW during the next scheduled telephone outreach call.    Keep the [facility] Provider List until your assessment is completed by [employer], and confirmation is received that you have been approved for services.  Return calls from [employer], or call them directly, if you have questions # [phone] or # [phone], or if you wish to check the status of your application.  Accept weekly telephone outreach calls from LCSW, in an attempt to receive assistance with obtaining Medicaid and PCS in the home.      Follow-up:  09/28/2020 at 1:30pm

## 2020-09-22 ENCOUNTER — Telehealth: Payer: Self-pay

## 2020-09-22 NOTE — Telephone Encounter (Signed)
May give verbal orders for PT

## 2020-09-22 NOTE — Telephone Encounter (Signed)
   Telephone encounter was:  Unsuccessful.  09/22/2020 Name: David Burton MRN: [id number] DOB: 1945/12/25  Unsuccessful outbound call made today to assist with:  Food Insecurity and Received email from [employer] at [employer] she spoke with both the patient and his daughter and they have decided to cancel meal delivery. Unable to leave message daughters' voicemail full.      Outreach Attempt:  3rd Attempt.  Referral closed unable to contact patient.  Received email from [name] at [employer] on Wheels she spoke with both the patient and his daughter and they have decided to cancel meal delivery. Unable to leave message daughters' voicemail full.      [name], AAS Paralegal, [place] . Embedded Care Coordination [facility] Health  Care Management  [address], [place] [address] [name]   [phone]   [email or web address]

## 2020-09-22 NOTE — Telephone Encounter (Signed)
Evaluation done. Requesting extention frequency of two times a week for four weeks to work on balance and strength, pain and fall prevention   To [employer]

## 2020-09-22 NOTE — Telephone Encounter (Signed)
Discussed with Shanon Brow.

## 2020-09-27 ENCOUNTER — Other Ambulatory Visit: Payer: Self-pay | Admitting: *Deleted

## 2020-09-27 DIAGNOSIS — I69398 Other sequelae of cerebral infarction: Secondary | ICD-10-CM | POA: Diagnosis not present

## 2020-09-27 DIAGNOSIS — G510 Bell's palsy: Secondary | ICD-10-CM | POA: Diagnosis not present

## 2020-09-27 DIAGNOSIS — I1310 Hypertensive heart and chronic kidney disease without heart failure, with stage 1 through stage 4 chronic kidney disease, or unspecified chronic kidney disease: Secondary | ICD-10-CM | POA: Diagnosis not present

## 2020-09-27 DIAGNOSIS — Z7902 Long term (current) use of antithrombotics/antiplatelets: Secondary | ICD-10-CM | POA: Diagnosis not present

## 2020-09-27 DIAGNOSIS — N179 Acute kidney failure, unspecified: Secondary | ICD-10-CM | POA: Diagnosis not present

## 2020-09-27 DIAGNOSIS — N1832 Chronic kidney disease, stage 3b: Secondary | ICD-10-CM | POA: Diagnosis not present

## 2020-09-27 DIAGNOSIS — Z87891 Personal history of nicotine dependence: Secondary | ICD-10-CM | POA: Diagnosis not present

## 2020-09-27 DIAGNOSIS — E1122 Type 2 diabetes mellitus with diabetic chronic kidney disease: Secondary | ICD-10-CM | POA: Diagnosis not present

## 2020-09-27 DIAGNOSIS — Z794 Long term (current) use of insulin: Secondary | ICD-10-CM | POA: Diagnosis not present

## 2020-09-27 DIAGNOSIS — E785 Hyperlipidemia, unspecified: Secondary | ICD-10-CM | POA: Diagnosis not present

## 2020-09-27 DIAGNOSIS — K219 Gastro-esophageal reflux disease without esophagitis: Secondary | ICD-10-CM | POA: Diagnosis not present

## 2020-09-27 DIAGNOSIS — E1142 Type 2 diabetes mellitus with diabetic polyneuropathy: Secondary | ICD-10-CM | POA: Diagnosis not present

## 2020-09-27 DIAGNOSIS — I252 Old myocardial infarction: Secondary | ICD-10-CM | POA: Diagnosis not present

## 2020-09-27 DIAGNOSIS — Z955 Presence of coronary angioplasty implant and graft: Secondary | ICD-10-CM | POA: Diagnosis not present

## 2020-09-27 DIAGNOSIS — E7800 Pure hypercholesterolemia, unspecified: Secondary | ICD-10-CM | POA: Diagnosis not present

## 2020-09-27 DIAGNOSIS — I25110 Atherosclerotic heart disease of native coronary artery with unstable angina pectoris: Secondary | ICD-10-CM | POA: Diagnosis not present

## 2020-09-27 DIAGNOSIS — Z9181 History of falling: Secondary | ICD-10-CM | POA: Diagnosis not present

## 2020-09-27 DIAGNOSIS — E876 Hypokalemia: Secondary | ICD-10-CM | POA: Diagnosis not present

## 2020-09-27 NOTE — Patient Outreach (Signed)
[facility]) Care Management  09/27/2020  David Burton 1946/03/23 [id number]   Outgoing call to daughter to complete initial assessment, no answer, HIPAA compliant voice message left.  Will follow up within the next 3-4 business days.  [name], [place], MSN [place] [phone]

## 2020-09-28 ENCOUNTER — Other Ambulatory Visit: Payer: Self-pay | Admitting: *Deleted

## 2020-09-28 NOTE — Patient Outreach (Signed)
[facility]) Care Management  [facility] Social Work  09/28/2020  David Burton 12-Jul-1945 [id number]  LCSW will assist patient and daughter with obtaining Medicaid, through the [place], [employer], through [employer] [place], and [employer], through [employer].   Encounter Medications:  Outpatient Encounter Medications as of 09/28/2020  Medication Sig Note  . isosorbide mononitrate (IMDUR) 60 MG 24 hr tablet TAKE 1 TABLET(60 MG) BY MOUTH DAILY   . amLODipine (NORVASC) 10 MG tablet Take 1 tablet (10 mg total) by mouth daily.   Marland Kitchen atorvastatin (LIPITOR) 20 MG tablet Take 1 tablet (20 mg total) by mouth daily.   . BD PEN NEEDLE NANO 2ND GEN 32G X 4 MM MISC USE 1 PEN THREE TIMES DAILY AS DIRECTED   . bisacodyl (DULCOLAX) 10 MG suppository Place 10 mg rectally daily as needed for moderate constipation.   . clopidogrel (PLAVIX) 75 MG tablet Take 1 tablet (75 mg total) by mouth daily. 09/11/2020: Currently on hold  . ezetimibe (ZETIA) 10 MG tablet Take 1 tablet (10 mg total) by mouth daily.   Marland Kitchen glucose blood (ACCU-CHEK AVIVA PLUS) test strip Use to test blood sugar 3 times daily. Dx: E11.42   . HYDROcodone-acetaminophen (NORCO) 10-325 MG tablet Take 1 tablet by mouth 3 (three) times daily as needed.   . insulin detemir (LEVEMIR) 100 UNIT/ML FlexPen Inject 20 Units into the skin at bedtime.   . insulin lispro (HUMALOG KWIKPEN) 100 UNIT/ML KwikPen Inject 8-10 units into the skin per sliding scale three times daily with meals.   . magic mouthwash w/lidocaine SOLN Take 2 mLs by mouth 3 (three) times daily as needed for mouth pain.   . metoprolol tartrate (LOPRESSOR) 25 MG tablet Take 0.5 tablets (12.5 mg total) by mouth 2 (two) times daily.   . nitroGLYCERIN (NITROSTAT) 0.4 MG SL tablet Dissolve one tablet under tongue as needed for chest pain. (Patient taking differently: Place 0.4 mg under the tongue  every 5 (five) minutes as needed for chest pain.)   . phenol (CHLORASEPTIC) 1.4 % LIQD Use as directed 1 spray in the mouth or throat as needed for throat irritation / pain.   Marland Kitchen REPATHA 140 MG/ML SOSY Inject 140 mg into the skin every 14 (fourteen) days.   Marland Kitchen saccharomyces boulardii (FLORASTOR) 250 MG capsule Take 1 capsule (250 mg total) by mouth 2 (two) times daily.   Marland Kitchen senna (SENOKOT) 8.6 MG TABS tablet Take 1 tablet (8.6 mg total) by mouth daily. (Patient not taking: Reported on 09/11/2020)    No facility-administered encounter medications on file as of 09/28/2020.    Functional Status:  In your present state of health, do you have any difficulty performing the following activities: 09/21/2020 09/14/2020  Hearing? - -  Vision? - -  Difficulty concentrating or making decisions? - -  Walking or climbing stairs? - -  Dressing or bathing? - -  Doing errands, shopping? - N  Conservation officer, nature and eating ? N -  Using the Toilet? N -  In the past six months, have you accidently leaked urine? N -  Do you have problems with loss of bowel control? N -  Managing your Medications? N -  Managing your Finances? N -  Housekeeping or managing your Housekeeping? Y -  Comment Applying for PCS. -  Some recent data might be hidden    Fall/Depression Screening:  PHQ 2/9 Scores 09/21/2020  PHQ - 2 Score 0  Assessment:  Goals Addressed              This Visit's Progress   .  Maintain My Quality of Life By Receiving [place] in the Home. (pt-stated)   On track     Timeframe:  Short-Term Goal Priority:  High Start Date:    09/21/2020                      Expected End Date:   11/01/2020   Follow Up Date:  10/10/2020 at 9:00am.   Patient Goals/Self-Care Activities:   Review PCS (Personal Care Services) Instructions, Application and Agency Provider List with daughter, and notify LCSW if you need assistance with application completion and/or submission.   Review 2021 Medicaid Tips  and Medicaid Application and notify LCSW if you need assistance with application completion and/or submission.  Patient and daughter both received the following resource information and applications in the mail:  2021 Medicaid Tips, Medicaid Application, Personal Care Services Instructions, Application and Agency Provider List.  Select 2 to 3 agencies you would like to use, from the Agency Provider List, and be prepared to share those agencies of interest with LCSW during the next scheduled telephone outreach call.    Keep the Agency Provider List until your assessment is completed by [employer], and confirmation is received that you have been approved for services.  Return calls from [employer], or call them directly, if you have questions # [phone] or # [phone], or if you wish to check the status of your application.  Accept weekly telephone outreach calls from LCSW, in an attempt to receive assistance with obtaining Medicaid and PCS in the home.     Follow-Up Date:  10/10/2020 at [place], BSW, MSW, [place]  Licensed Clinical Social Worker  [place]  [employer]. [address] [place], [place] [address] Physical Address-[address], [place], [place] [address] Toll Free Main # [phone] Fax # [phone] Cell # [phone]  [email or web address]

## 2020-09-29 DIAGNOSIS — N1832 Chronic kidney disease, stage 3b: Secondary | ICD-10-CM | POA: Diagnosis not present

## 2020-09-29 DIAGNOSIS — Z955 Presence of coronary angioplasty implant and graft: Secondary | ICD-10-CM | POA: Diagnosis not present

## 2020-09-29 DIAGNOSIS — Z7902 Long term (current) use of antithrombotics/antiplatelets: Secondary | ICD-10-CM | POA: Diagnosis not present

## 2020-09-29 DIAGNOSIS — E876 Hypokalemia: Secondary | ICD-10-CM | POA: Diagnosis not present

## 2020-09-29 DIAGNOSIS — E7800 Pure hypercholesterolemia, unspecified: Secondary | ICD-10-CM | POA: Diagnosis not present

## 2020-09-29 DIAGNOSIS — K219 Gastro-esophageal reflux disease without esophagitis: Secondary | ICD-10-CM | POA: Diagnosis not present

## 2020-09-29 DIAGNOSIS — I25110 Atherosclerotic heart disease of native coronary artery with unstable angina pectoris: Secondary | ICD-10-CM | POA: Diagnosis not present

## 2020-09-29 DIAGNOSIS — E1142 Type 2 diabetes mellitus with diabetic polyneuropathy: Secondary | ICD-10-CM | POA: Diagnosis not present

## 2020-09-29 DIAGNOSIS — G510 Bell's palsy: Secondary | ICD-10-CM | POA: Diagnosis not present

## 2020-09-29 DIAGNOSIS — I252 Old myocardial infarction: Secondary | ICD-10-CM | POA: Diagnosis not present

## 2020-09-29 DIAGNOSIS — I1310 Hypertensive heart and chronic kidney disease without heart failure, with stage 1 through stage 4 chronic kidney disease, or unspecified chronic kidney disease: Secondary | ICD-10-CM | POA: Diagnosis not present

## 2020-09-29 DIAGNOSIS — E785 Hyperlipidemia, unspecified: Secondary | ICD-10-CM | POA: Diagnosis not present

## 2020-09-29 DIAGNOSIS — Z87891 Personal history of nicotine dependence: Secondary | ICD-10-CM | POA: Diagnosis not present

## 2020-09-29 DIAGNOSIS — Z794 Long term (current) use of insulin: Secondary | ICD-10-CM | POA: Diagnosis not present

## 2020-09-29 DIAGNOSIS — Z9181 History of falling: Secondary | ICD-10-CM | POA: Diagnosis not present

## 2020-09-29 DIAGNOSIS — N179 Acute kidney failure, unspecified: Secondary | ICD-10-CM | POA: Diagnosis not present

## 2020-09-29 DIAGNOSIS — E1122 Type 2 diabetes mellitus with diabetic chronic kidney disease: Secondary | ICD-10-CM | POA: Diagnosis not present

## 2020-09-29 DIAGNOSIS — I69398 Other sequelae of cerebral infarction: Secondary | ICD-10-CM | POA: Diagnosis not present

## 2020-10-03 ENCOUNTER — Other Ambulatory Visit: Payer: Self-pay | Admitting: *Deleted

## 2020-10-03 NOTE — Patient Outreach (Signed)
[facility]) Care Management  10/03/2020  David Burton 02-10-1946 [id number]   Outgoing call placed to member, no answer, HIPAA compliant voice message left.  Call placed to daughter, successful but state not a good time to talk as she is at work.  Confirms she received paperwork from [place], will be able to discuss during next outreach.  Request call back Thursday after 3pm.  [name], RN, MSN [place] [phone]

## 2020-10-04 DIAGNOSIS — E876 Hypokalemia: Secondary | ICD-10-CM | POA: Diagnosis not present

## 2020-10-04 DIAGNOSIS — G510 Bell's palsy: Secondary | ICD-10-CM | POA: Diagnosis not present

## 2020-10-04 DIAGNOSIS — N1832 Chronic kidney disease, stage 3b: Secondary | ICD-10-CM | POA: Diagnosis not present

## 2020-10-04 DIAGNOSIS — Z87891 Personal history of nicotine dependence: Secondary | ICD-10-CM | POA: Diagnosis not present

## 2020-10-04 DIAGNOSIS — I25110 Atherosclerotic heart disease of native coronary artery with unstable angina pectoris: Secondary | ICD-10-CM | POA: Diagnosis not present

## 2020-10-04 DIAGNOSIS — I252 Old myocardial infarction: Secondary | ICD-10-CM | POA: Diagnosis not present

## 2020-10-04 DIAGNOSIS — I69398 Other sequelae of cerebral infarction: Secondary | ICD-10-CM | POA: Diagnosis not present

## 2020-10-04 DIAGNOSIS — Z9181 History of falling: Secondary | ICD-10-CM | POA: Diagnosis not present

## 2020-10-04 DIAGNOSIS — Z794 Long term (current) use of insulin: Secondary | ICD-10-CM | POA: Diagnosis not present

## 2020-10-04 DIAGNOSIS — I1310 Hypertensive heart and chronic kidney disease without heart failure, with stage 1 through stage 4 chronic kidney disease, or unspecified chronic kidney disease: Secondary | ICD-10-CM | POA: Diagnosis not present

## 2020-10-04 DIAGNOSIS — E7800 Pure hypercholesterolemia, unspecified: Secondary | ICD-10-CM | POA: Diagnosis not present

## 2020-10-04 DIAGNOSIS — Z955 Presence of coronary angioplasty implant and graft: Secondary | ICD-10-CM | POA: Diagnosis not present

## 2020-10-04 DIAGNOSIS — E1142 Type 2 diabetes mellitus with diabetic polyneuropathy: Secondary | ICD-10-CM | POA: Diagnosis not present

## 2020-10-04 DIAGNOSIS — Z7902 Long term (current) use of antithrombotics/antiplatelets: Secondary | ICD-10-CM | POA: Diagnosis not present

## 2020-10-04 DIAGNOSIS — N179 Acute kidney failure, unspecified: Secondary | ICD-10-CM | POA: Diagnosis not present

## 2020-10-04 DIAGNOSIS — E785 Hyperlipidemia, unspecified: Secondary | ICD-10-CM | POA: Diagnosis not present

## 2020-10-04 DIAGNOSIS — E1122 Type 2 diabetes mellitus with diabetic chronic kidney disease: Secondary | ICD-10-CM | POA: Diagnosis not present

## 2020-10-04 DIAGNOSIS — K219 Gastro-esophageal reflux disease without esophagitis: Secondary | ICD-10-CM | POA: Diagnosis not present

## 2020-10-05 ENCOUNTER — Encounter: Payer: Self-pay | Admitting: *Deleted

## 2020-10-05 ENCOUNTER — Other Ambulatory Visit: Payer: Self-pay | Admitting: *Deleted

## 2020-10-05 NOTE — Patient Outreach (Signed)
[facility]) Care Management  10/05/2020  David Burton 16-Nov-1945 [id number]   Outreach #2 to daughter, successful.  State member has been doing much better since he was able to have his oral surgery.  He is eating and drinking more, no signs of dehydration.  Continues to have visits from PT to build strength.  She is concerned about his living arrangements, would like to see if she can get him to a different senior apartment complex.  Advised that [facility] CSW is scheduled to contact her next week, encouraged to voice her concerns.  Denies any urgent concerns, encouraged to contact this care manager with questions.  Agrees to follow up within the next month.  Goals Addressed            This Visit's Progress   . [facility] - Make and Keep All Appointments       Timeframe:  Short-Term Goal Priority:  Medium Start Date:     5/5                      Expected End Date:  6/5                     Barriers: Knowledge  Follow Up Date 6/2    - call to cancel if needed - keep a calendar with appointment dates    Why is this important?    Part of staying healthy is seeing the doctor for follow-up care.   If you forget your appointments, there are some things you can do to stay on track.    Notes:   5/5 - Verified with daughter that she would be keeping up with all member's appointments and providing transportation.  Appointment for diabetic shoes on 5/12    . [facility] - Prevent Falls and Injury       Follow Up Date 6/2  Timeframe:  Long-Range Goal Priority:  High Start Date:           5/5                  Expected End Date:    7/5  Barriers: Health Behaviors Knowledge                          - always use handrails on the stairs - keep my cell phone with me always - learn how to get back up if I fall - use a nonslip pad with throw rugs, or remove them completely - use a nightlight in the bathroom - wear my glasses and/or hearing aid    Why is this important?    Most  falls happen when it is hard for you to walk safely. Your balance may be off because of an illness. You may have pain in your knees, hip or other joints.   You may be overly tired or taking medicines that make you sleepy. You may not be able to see or hear clearly.   Falls can lead to broken bones, bruises or other injuries.   There are things you can do to help prevent falling.     Notes:   5/5 - Education to prevent falls sent to member/daughter      [name], RN, MSN [place] [phone]

## 2020-10-06 ENCOUNTER — Encounter: Payer: Self-pay | Admitting: Podiatry

## 2020-10-06 ENCOUNTER — Other Ambulatory Visit: Payer: Self-pay

## 2020-10-06 ENCOUNTER — Ambulatory Visit (INDEPENDENT_AMBULATORY_CARE_PROVIDER_SITE_OTHER): Payer: Medicare Other | Admitting: Podiatry

## 2020-10-06 DIAGNOSIS — E1142 Type 2 diabetes mellitus with diabetic polyneuropathy: Secondary | ICD-10-CM

## 2020-10-06 DIAGNOSIS — B351 Tinea unguium: Secondary | ICD-10-CM

## 2020-10-06 DIAGNOSIS — M79674 Pain in right toe(s): Secondary | ICD-10-CM | POA: Diagnosis not present

## 2020-10-06 DIAGNOSIS — E119 Type 2 diabetes mellitus without complications: Secondary | ICD-10-CM

## 2020-10-06 DIAGNOSIS — M79675 Pain in left toe(s): Secondary | ICD-10-CM | POA: Diagnosis not present

## 2020-10-06 DIAGNOSIS — M2012 Hallux valgus (acquired), left foot: Secondary | ICD-10-CM

## 2020-10-06 DIAGNOSIS — M2141 Flat foot [pes planus] (acquired), right foot: Secondary | ICD-10-CM

## 2020-10-06 DIAGNOSIS — M2011 Hallux valgus (acquired), right foot: Secondary | ICD-10-CM | POA: Diagnosis not present

## 2020-10-06 DIAGNOSIS — M2142 Flat foot [pes planus] (acquired), left foot: Secondary | ICD-10-CM | POA: Diagnosis not present

## 2020-10-06 MED ORDER — HYDROCODONE-ACETAMINOPHEN 10-325 MG PO TABS: 1.0000 | ORAL_TABLET | Freq: Three times a day (TID) | ORAL | 0 refills | Status: DC | PRN

## 2020-10-06 NOTE — Patient Instructions (Signed)
David Burton,  Please schedule an appointment for diabetic shoe measurements and shoe selection with our office at your convenience.   Diabetes Mellitus and Foot Care Foot care is an important part of your health, especially when you have diabetes. Diabetes may cause you to have problems because of poor blood flow (circulation) to your feet and legs, which can cause your skin to:  Become thinner and drier.  Break more easily.  Heal more slowly.  Peel and crack. You may also have nerve damage (neuropathy) in your legs and feet, causing decreased feeling in them. This means that you may not notice minor injuries to your feet that could lead to more serious problems. Noticing and addressing any potential problems early is the best way to prevent future foot problems. How to care for your feet Foot hygiene  Wash your feet daily with warm water and mild soap. Do not use hot water. Then, pat your feet and the areas between your toes until they are completely dry. Do not soak your feet as this can dry your skin.  Trim your toenails straight across. Do not dig under them or around the cuticle. File the edges of your nails with an emery board or nail file.  Apply a moisturizing lotion or petroleum jelly to the skin on your feet and to dry, brittle toenails. Use lotion that does not contain alcohol and is unscented. Do not apply lotion between your toes.   Shoes and socks  Wear clean socks or stockings every day. Make sure they are not too tight. Do not wear knee-high stockings since they may decrease blood flow to your legs.  Wear shoes that fit properly and have enough cushioning. Always look in your shoes before you put them on to be sure there are no objects inside.  To break in new shoes, wear them for just a few hours a day. This prevents injuries on your feet. Wounds, scrapes, corns, and calluses  Check your feet daily for blisters, cuts, bruises, sores, and redness. If you cannot see the  bottom of your feet, use a mirror or ask someone for help.  Do not cut corns or calluses or try to remove them with medicine.  If you find a minor scrape, cut, or break in the skin on your feet, keep it and the skin around it clean and dry. You may clean these areas with mild soap and water. Do not clean the area with peroxide, alcohol, or iodine.  If you have a wound, scrape, corn, or callus on your foot, look at it several times a day to make sure it is healing and not infected. Check for: ? Redness, swelling, or pain. ? Fluid or blood. ? Warmth. ? Pus or a bad smell.   General tips  Do not cross your legs. This may decrease blood flow to your feet.  Do not use heating pads or hot water bottles on your feet. They may burn your skin. If you have lost feeling in your feet or legs, you may not know this is happening until it is too late.  Protect your feet from hot and cold by wearing shoes, such as at the beach or on hot pavement.  Schedule a complete foot exam at least once a year (annually) or more often if you have foot problems. Report any cuts, sores, or bruises to your health care provider immediately. Where to find more information  American Diabetes Association: www.diabetes.org  Association of Diabetes Care &  Education Specialists: www.diabeteseducator.org Contact a health care provider if:  You have a medical condition that increases your risk of infection and you have any cuts, sores, or bruises on your feet.  You have an injury that is not healing.  You have redness on your legs or feet.  You feel burning or tingling in your legs or feet.  You have pain or cramps in your legs and feet.  Your legs or feet are numb.  Your feet always feel cold.  You have pain around any toenails. Get help right away if:  You have a wound, scrape, corn, or callus on your foot and: ? You have pain, swelling, or redness that gets worse. ? You have fluid or blood coming from the  wound, scrape, corn, or callus. ? Your wound, scrape, corn, or callus feels warm to the touch. ? You have pus or a bad smell coming from the wound, scrape, corn, or callus. ? You have a fever. ? You have a red line going up your leg. Summary  Check your feet every day for blisters, cuts, bruises, sores, and redness.  Apply a moisturizing lotion or petroleum jelly to the skin on your feet and to dry, brittle toenails.  Wear shoes that fit properly and have enough cushioning.  If you have foot problems, report any cuts, sores, or bruises to your health care provider immediately.  Schedule a complete foot exam at least once a year (annually) or more often if you have foot problems. This information is not intended to replace advice given to you by your health care provider. Make sure you discuss any questions you have with your health care provider. Document Revised: 12/09/2019 Document Reviewed: 12/09/2019 Elsevier Patient Education  [place].

## 2020-10-06 NOTE — Telephone Encounter (Signed)
Incoming call received again from patient stating he does not understand why rx has not been sent to the pharmacy. Patient states he is tired of going through this delay every month with [place]. Patient states he is having back pain and this medication should have been sent to the pharmacy by now.  I apologized for the delay, informed patient that we have a 24 hour turn around time with refills, and since his request was made on a Friday we will be sure to reply before the end of the day. I also informed patient that Webb Silversmith is seeing in office patients and will address this request, when time allowed. I asked patient if he would be interested in a referral to a pain management provider to take over his rx for hydrocodone.   Patient replied by saying he is not going to a pain management doctor and at this point it doesn't matter if Dinah fills rx or not. Mr.Kerlin ended the call abruptly by hanging up in my face.

## 2020-10-06 NOTE — Progress Notes (Signed)
Subjective: David Burton presents today referred by [name], [name], NP for diabetic foot evaluation.  Patient relates 5 year history of diabetes.  Patient denies history of foot wounds.  Patient denies symptoms of numbness in feet.  Patient denies symptoms of tingling in feet.  Patient relates occasional symptoms of burning in feet.  Patient denies symptoms of pins/needles sensations in feet.  Patient relates blood glucose was 200 mg/dl this morning.   PCP is [name], [name], NP , and last visit was 09/08/2020.  Today, patient c/o of painful, discolored, thick toenails x one month which interfere with daily activities.  Pain is aggravated when wearing enclosed shoe gear. He has not attempted treatment. He states when he was with [facility], they provided diabetic shoes and he is also interested in obtaining diabetic shoes on today's visit.  Past Medical History:  Diagnosis Date  . Bell palsy   . CKD (chronic kidney disease), stage III ([place]) 12/16/2018  . Coronary artery disease involving native coronary artery of native heart with unstable angina pectoris ([place]) 12/16/2018  . GERD (gastroesophageal reflux disease)   . Hypertension   . Hypertension with heart disease 12/16/2018  . Myocardial infarction ([place])   . Pure hypercholesterolemia 12/16/2018  . Stroke ([place])   . Type 2 diabetes, controlled, with peripheral neuropathy ([place]) 12/16/2018    Patient Active Problem List   Diagnosis Date Noted  . Obese 09/13/2020  . Morbidly obese ([place]) 09/13/2020  . Acute renal failure superimposed on chronic kidney disease ([place]) 09/11/2020  . Metabolic acidosis 53/97/6734  . Mouth pain 09/11/2020  . DNR (do not resuscitate) 09/11/2020  . Alcohol use 01/30/2020  . History of stroke 01/30/2020  . Hyponatremia 01/30/2020  . GERD (gastroesophageal reflux disease)   . Diastolic dysfunction 19/37/9024  . Neurocognitive deficits 09/23/2019  . Elbow swelling, left   . Generalized weakness   . Right  pontine stroke ([place]) 09/16/2019  . Essential hypertension 12/16/2018  . CAD (coronary artery disease), native coronary artery 12/16/2018  . CKD (chronic kidney disease), stage III ([place]) 12/16/2018  . Pure hypercholesterolemia 12/16/2018  . Type 2 diabetes, controlled, with peripheral neuropathy ([place]) 12/16/2018  . S/P CABG x 4 05/11/2017  . Lower abdominal pain 05/24/2015    Past Surgical History:  Procedure Laterality Date  . CARDIAC SURGERY    . COLONOSCOPY WITH PROPOFOL N/A 01/02/2018   Procedure: COLONOSCOPY WITH PROPOFOL;  Surgeon: Carol Ada, MD;  Location: [facility] ENDOSCOPY;  Service: Endoscopy;  Laterality: N/A;  . CORONARY STENT INTERVENTION N/A 01/26/2019   Procedure: CORONARY STENT INTERVENTION;  Surgeon: Adrian Prows, MD;  Location: [facility] CV LAB;  Service: Cardiovascular;  Laterality: N/A;  . LEFT HEART CATH AND CORS/GRAFTS ANGIOGRAPHY N/A 01/05/2019   Procedure: LEFT HEART CATH AND CORS/GRAFTS ANGIOGRAPHY;  Surgeon: Adrian Prows, MD;  Location: [facility];  Service: Cardiovascular;  Laterality: N/A;  . POLYPECTOMY  01/02/2018   Procedure: POLYPECTOMY;  Surgeon: Carol Ada, MD;  Location: [facility] ENDOSCOPY;  Service: Endoscopy;;  . stents      Current Outpatient Medications on File Prior to Visit  Medication Sig Dispense Refill  . isosorbide mononitrate (IMDUR) 60 MG 24 hr tablet TAKE 1 TABLET(60 MG) BY MOUTH DAILY 90 tablet 1  . amLODipine (NORVASC) 10 MG tablet Take 1 tablet (10 mg total) by mouth daily. 30 tablet 0  . amLODipine (NORVASC) 5 MG tablet Take 1 tablet by mouth daily.    Marland Kitchen amoxicillin (AMOXIL) 500 MG tablet Take 500 mg by  mouth 3 (three) times daily.    Marland Kitchen atorvastatin (LIPITOR) 20 MG tablet Take 1 tablet (20 mg total) by mouth daily. 30 tablet 0  . BD PEN NEEDLE NANO 2ND GEN 32G X 4 MM MISC USE 1 PEN THREE TIMES DAILY AS DIRECTED 100 each 3  . bisacodyl (DULCOLAX) 10 MG suppository Place 10 mg rectally daily as needed for moderate constipation.    .  chlorhexidine (PERIDEX) 0.12 % solution SMARTSIG:By Mouth    . clopidogrel (PLAVIX) 75 MG tablet Take 1 tablet (75 mg total) by mouth daily. 90 tablet 1  . ezetimibe (ZETIA) 10 MG tablet Take 1 tablet (10 mg total) by mouth daily. 30 tablet 0  . glucose blood (ACCU-CHEK AVIVA PLUS) test strip Use to test blood sugar 3 times daily. Dx: E11.42 300 each 1  . ibuprofen (ADVIL) 600 MG tablet Take 600 mg by mouth every 6 (six) hours as needed.    . insulin detemir (LEVEMIR) 100 UNIT/ML FlexPen Inject 20 Units into the skin at bedtime. 30 mL 1  . insulin lispro (HUMALOG KWIKPEN) 100 UNIT/ML KwikPen Inject 8-10 units into the skin per sliding scale three times daily with meals. 45 mL 3  . magic mouthwash w/lidocaine SOLN Take 2 mLs by mouth 3 (three) times daily as needed for mouth pain. 15 mL 0  . metoprolol tartrate (LOPRESSOR) 25 MG tablet Take 0.5 tablets (12.5 mg total) by mouth 2 (two) times daily. 30 tablet 0  . nitroGLYCERIN (NITROSTAT) 0.4 MG SL tablet Dissolve one tablet under tongue as needed for chest pain. (Patient taking differently: Place 0.4 mg under the tongue every 5 (five) minutes as needed for chest pain.) 30 tablet 3  . NOVOLOG FLEXPEN 100 UNIT/ML FlexPen SMARTSIG:8-10 Unit(s) SUB-Q 3 Times Daily    . phenol (CHLORASEPTIC) 1.4 % LIQD Use as directed 1 spray in the mouth or throat as needed for throat irritation / pain. 20 mL 0  . REPATHA 140 MG/ML SOSY Inject 140 mg into the skin every 14 (fourteen) days. 2.1 mL 3  . saccharomyces boulardii (FLORASTOR) 250 MG capsule Take 1 capsule (250 mg total) by mouth 2 (two) times daily. 20 capsule 0  . senna (SENOKOT) 8.6 MG TABS tablet Take 1 tablet (8.6 mg total) by mouth daily. (Patient not taking: Reported on 09/11/2020) 120 tablet 5   No current facility-administered medications on file prior to visit.     No Known Allergies  Social History   Occupational History  . Occupation: Retired  Tobacco Use  . Smoking status: Former Smoker     Packs/day: 1.00    Years: 5.00    Pack years: 5.00    Types: Cigarettes    Quit date: 2005    Years since quitting: 17.3  . Smokeless tobacco: Never Used  Vaping Use  . Vaping Use: Never used  Substance and Sexual Activity  . Alcohol use: Not Currently  . Drug use: No  . Sexual activity: Never    Family History  Problem Relation Age of Onset  . Cancer Mother   . Heart attack Father     Immunization History  Administered Date(s) Administered  . PFIZER(Purple Top)SARS-COV-2 Vaccination 08/13/2019, 09/06/2019  . Pneumococcal Conjugate-13 01/14/2020    Objective: There were no vitals filed for this visit.  David Burton is a pleasant 75 y.o. male obese in NAD. AAO X 3.  Vascular Examination: Capillary fill time to digits <3 seconds b/l lower extremities. Palpable pedal pulses b/l LE. Pedal hair  absent. Lower extremity skin temperature gradient within normal limits. No pain with calf compression b/l. Trace edema noted b/l lower extremities.  Dermatological Examination: Pedal skin with normal turgor, texture and tone bilaterally. No open wounds bilaterally. No interdigital macerations bilaterally. Toenails 1-5 b/l elongated, discolored, dystrophic, thickened, crumbly with subungual debris and tenderness to dorsal palpation.  Musculoskeletal Examination: Normal muscle strength 5/5 to all lower extremity muscle groups bilaterally. No pain crepitus or joint limitation noted with ROM b/l. Hallux valgus with bunion deformity noted b/l lower extremities. Pes planus deformity noted b/l.  Patient ambulates independent of any assistive aids.  Footwear Assessment: Does the patient wear appropriate shoes? No. Does the patient need inserts/orthotics? Yes.  Neurological Examination: Protective sensation intact 5/5 intact bilaterally with 10g monofilament b/l. Vibratory sensation intact b/l. Clonus negative b/l.  Lab: Hemoglobin A1C Latest Ref Rng & Units 09/06/2020 05/08/2020 03/02/2020  01/10/2020  HGBA1C <5.7 % of total Hgb 8.1(H) 6.6(H) 8.8(H) 12.9(H)  Some recent data might be hidden   Assessment: No diagnosis found.   ADA Risk Categorization:  Low Risk:  Patient has all of the following: Intact protective sensation No prior foot ulcer  No severe deformity Pedal pulses present  Plan: -Examined patient. -Diabetic foot examination performed on today's visit. -Patient to continue soft, supportive shoe gear daily. Start procedure for diabetic shoes. Patient qualifies based on diagnoses. -Patient to schedule appointment with Pedorthist for diabetic shoe measurements. -Toenails 1-5 b/l were debrided in length and girth with sterile nail nippers and dremel without iatrogenic bleeding.  -Patient to report any pedal injuries to medical professional immediately. -Patient/POA to call should there be question/concern in the interim.  Return in about 3 months (around 01/06/2021).  Marzetta Board, DPM

## 2020-10-06 NOTE — Telephone Encounter (Signed)
RX last filled on 09/07/2020 and treatment agreement on file from 06/12/2020

## 2020-10-09 MED ORDER — ATORVASTATIN CALCIUM 20 MG PO TABS: 20.0000 mg | ORAL_TABLET | Freq: Every day | ORAL | 0 refills | Status: DC

## 2020-10-10 ENCOUNTER — Other Ambulatory Visit: Payer: Self-pay | Admitting: *Deleted

## 2020-10-10 ENCOUNTER — Encounter: Payer: Self-pay | Admitting: *Deleted

## 2020-10-10 DIAGNOSIS — N1832 Chronic kidney disease, stage 3b: Secondary | ICD-10-CM | POA: Diagnosis not present

## 2020-10-10 DIAGNOSIS — I1310 Hypertensive heart and chronic kidney disease without heart failure, with stage 1 through stage 4 chronic kidney disease, or unspecified chronic kidney disease: Secondary | ICD-10-CM | POA: Diagnosis not present

## 2020-10-10 DIAGNOSIS — Z87891 Personal history of nicotine dependence: Secondary | ICD-10-CM | POA: Diagnosis not present

## 2020-10-10 DIAGNOSIS — E1142 Type 2 diabetes mellitus with diabetic polyneuropathy: Secondary | ICD-10-CM | POA: Diagnosis not present

## 2020-10-10 DIAGNOSIS — I25110 Atherosclerotic heart disease of native coronary artery with unstable angina pectoris: Secondary | ICD-10-CM | POA: Diagnosis not present

## 2020-10-10 DIAGNOSIS — N179 Acute kidney failure, unspecified: Secondary | ICD-10-CM | POA: Diagnosis not present

## 2020-10-10 DIAGNOSIS — E785 Hyperlipidemia, unspecified: Secondary | ICD-10-CM | POA: Diagnosis not present

## 2020-10-10 DIAGNOSIS — I252 Old myocardial infarction: Secondary | ICD-10-CM | POA: Diagnosis not present

## 2020-10-10 DIAGNOSIS — Z9181 History of falling: Secondary | ICD-10-CM | POA: Diagnosis not present

## 2020-10-10 DIAGNOSIS — I69398 Other sequelae of cerebral infarction: Secondary | ICD-10-CM | POA: Diagnosis not present

## 2020-10-10 DIAGNOSIS — E876 Hypokalemia: Secondary | ICD-10-CM | POA: Diagnosis not present

## 2020-10-10 DIAGNOSIS — E7800 Pure hypercholesterolemia, unspecified: Secondary | ICD-10-CM | POA: Diagnosis not present

## 2020-10-10 DIAGNOSIS — G510 Bell's palsy: Secondary | ICD-10-CM | POA: Diagnosis not present

## 2020-10-10 DIAGNOSIS — E1122 Type 2 diabetes mellitus with diabetic chronic kidney disease: Secondary | ICD-10-CM | POA: Diagnosis not present

## 2020-10-10 DIAGNOSIS — Z794 Long term (current) use of insulin: Secondary | ICD-10-CM | POA: Diagnosis not present

## 2020-10-10 DIAGNOSIS — K219 Gastro-esophageal reflux disease without esophagitis: Secondary | ICD-10-CM | POA: Diagnosis not present

## 2020-10-10 DIAGNOSIS — Z7902 Long term (current) use of antithrombotics/antiplatelets: Secondary | ICD-10-CM | POA: Diagnosis not present

## 2020-10-10 DIAGNOSIS — Z955 Presence of coronary angioplasty implant and graft: Secondary | ICD-10-CM | POA: Diagnosis not present

## 2020-10-10 NOTE — Patient Outreach (Signed)
Care Management Clinical Social Work Note  10/10/2020 Name: David Burton MRN: [id number] DOB: 09-Jun-1945  David Burton is a 75 y.o. year old male who is a primary care patient of [name], [name], NP.  The Care Management team was consulted for assistance with chronic disease management and coordination needs.  LCSW assisted patient and daughter, Anais Koenen with obtaining Adult Medicaid, through the [place], [employer], through [employer], and [employer], through [employer].  Engaged with patient by telephone for follow up visit in response to provider referral for social work chronic care management and care coordination services  Consent to Services:  Mr. Abdon was given information about Care Management services today including:  1. Care Management services includes personalized support from designated clinical staff supervised by his physician, including individualized plan of care and coordination with other care providers 2. 24/7 contact phone numbers for assistance for urgent and routine care needs. 3. The patient may stop case management services at any time by phone call to the office staff.  Patient agreed to services and consent obtained.   Assessment: Review of patient past medical history, allergies, medications, and health status, including review of relevant consultants reports was performed today as part of a comprehensive evaluation and provision of chronic care management and care coordination services.  SDOH (Social Determinants of Health) assessments and interventions performed:    Advanced Directives Status: Not addressed in this encounter.  Care Plan  No Known Allergies  Outpatient Encounter Medications as of 10/10/2020  Medication Sig Note  . isosorbide mononitrate (IMDUR) 60 MG 24 hr tablet TAKE 1 TABLET(60 MG) BY MOUTH DAILY   . amLODipine (NORVASC) 10 MG  tablet Take 1 tablet (10 mg total) by mouth daily.   Marland Kitchen amLODipine (NORVASC) 5 MG tablet Take 1 tablet by mouth daily.   Marland Kitchen amoxicillin (AMOXIL) 500 MG tablet Take 500 mg by mouth 3 (three) times daily.   Marland Kitchen atorvastatin (LIPITOR) 20 MG tablet Take 1 tablet (20 mg total) by mouth daily.   . BD PEN NEEDLE NANO 2ND GEN 32G X 4 MM MISC USE 1 PEN THREE TIMES DAILY AS DIRECTED   . bisacodyl (DULCOLAX) 10 MG suppository Place 10 mg rectally daily as needed for moderate constipation.   . chlorhexidine (PERIDEX) 0.12 % solution SMARTSIG:By Mouth   . clopidogrel (PLAVIX) 75 MG tablet Take 1 tablet (75 mg total) by mouth daily. 09/11/2020: Currently on hold  . ezetimibe (ZETIA) 10 MG tablet Take 1 tablet (10 mg total) by mouth daily.   Marland Kitchen glucose blood (ACCU-CHEK AVIVA PLUS) test strip Use to test blood sugar 3 times daily. Dx: E11.42   . HYDROcodone-acetaminophen (NORCO) 10-325 MG tablet Take 1 tablet by mouth 3 (three) times daily as needed.   Marland Kitchen ibuprofen (ADVIL) 600 MG tablet Take 600 mg by mouth every 6 (six) hours as needed.   . insulin detemir (LEVEMIR) 100 UNIT/ML FlexPen Inject 20 Units into the skin at bedtime.   . insulin lispro (HUMALOG KWIKPEN) 100 UNIT/ML KwikPen Inject 8-10 units into the skin per sliding scale three times daily with meals.   . magic mouthwash w/lidocaine SOLN Take 2 mLs by mouth 3 (three) times daily as needed for mouth pain.   . metoprolol tartrate (LOPRESSOR) 25 MG tablet Take 0.5 tablets (12.5 mg total) by mouth 2 (two) times daily.   . nitroGLYCERIN (NITROSTAT) 0.4 MG SL tablet Dissolve one tablet under tongue as needed  for chest pain. (Patient taking differently: Place 0.4 mg under the tongue every 5 (five) minutes as needed for chest pain.)   . NOVOLOG FLEXPEN 100 UNIT/ML FlexPen SMARTSIG:8-10 Unit(s) SUB-Q 3 Times Daily   . phenol (CHLORASEPTIC) 1.4 % LIQD Use as directed 1 spray in the mouth or throat as needed for throat irritation / pain.   Marland Kitchen REPATHA 140 MG/ML SOSY  Inject 140 mg into the skin every 14 (fourteen) days.   Marland Kitchen saccharomyces boulardii (FLORASTOR) 250 MG capsule Take 1 capsule (250 mg total) by mouth 2 (two) times daily.   Marland Kitchen senna (SENOKOT) 8.6 MG TABS tablet Take 1 tablet (8.6 mg total) by mouth daily. (Patient not taking: Reported on 09/11/2020)    No facility-administered encounter medications on file as of 10/10/2020.    Patient Active Problem List   Diagnosis Date Noted  . Obese 09/13/2020  . Morbidly obese ([place]) 09/13/2020  . Acute renal failure superimposed on chronic kidney disease ([place]) 09/11/2020  . Metabolic acidosis 55/73/2202  . Mouth pain 09/11/2020  . DNR (do not resuscitate) 09/11/2020  . Alcohol use 01/30/2020  . History of stroke 01/30/2020  . Hyponatremia 01/30/2020  . GERD (gastroesophageal reflux disease)   . Diastolic dysfunction 54/27/0623  . Neurocognitive deficits 09/23/2019  . Elbow swelling, left   . Generalized weakness   . Right pontine stroke ([place]) 09/16/2019  . Essential hypertension 12/16/2018  . CAD (coronary artery disease), native coronary artery 12/16/2018  . CKD (chronic kidney disease), stage III ([place]) 12/16/2018  . Pure hypercholesterolemia 12/16/2018  . Type 2 diabetes, controlled, with peripheral neuropathy ([place]) 12/16/2018  . S/P CABG x 4 05/11/2017  . Lower abdominal pain 05/24/2015    Conditions to be addressed/monitored: Morbidly Obese, Acute Renal Failure, Chronic Kidney Disease, Generalized Weakness, Hypertension.  Limited Access to Caregiver, Limited Ability to Perform Activities of Daily Living.  Care Plan : LCSW Plan of Care  Updates made by Francis Gaines, LCSW since 10/10/2020 12:00 AM    Problem: Maintain My Quality of Life By Receiving [place] in the Home. Resolved 10/10/2020  Priority: High    Goal: Maintain My Quality of Life By Receiving [place] in the Home. Completed 10/10/2020  Start Date: 09/21/2020  Expected End Date: 10/10/2020  This  Visit's Progress: On track  Recent Progress: On track  Priority: High  Note:   Current Barriers:    Patient with Gait Instability, Generalized Weakness, History of Stroke, Morbid Obesity, Stage III Chronic Kidney Disease and Type II Diabetes Mellitus with Peripheral Neuropathy needs Support, Education, and Care Coordination to resolve unmet personal care needs.  Patient unable to consistently perform ADL's (Activities of Daily Living) independently and needs assistance and support in order to meet this unmet need.  Currently unable to independently self manage needs related to chronic health conditions.   Limited social support, level of care concerns, social isolation, and limited ability to perform ADL's independently.  Patient requires assistance with completion and submission of application for PCS ([place]), through [employer].  Clinical Goals:  Over the next 30 days, patient and daughter will work with [employer], and [employer], to coordinate care for [employer] and select a Production designer, theatre/television/film provider.  Over the next 45 days, patient will have personal care needs met, as evidenced by having PCS Aide in the home assisting with needs.  Clinical Interventions:  Assessed needs, level of care concerns, basic eligibility and provided education on PCS process.  Provided PCS application, instructions and list of agency providers, as well as what to expect with PCS process.  Provided 2021 Medicaid Tips and Medicaid application, and offered to assist with application completion and submission, if necessary.  Collaboration with Leone Haven, MD to request assistance with completion of PCS application.  PCS referral will be faxed to [employer] at [employer] [phone], once completed and signed by PCP.  LCSW will collaborate with [employer] to verify application is received and processed.   Other  interventions provided: Solution-Focused Strategies, Psychotropic Medication Adherence Assessment, Sleep Hygiene, Problem Solving, Teaching/Coaching Strategies.  Collaboration with Leone Haven, MD regarding development and update of comprehensive plan of care as evidenced by provider attestation and co-signature.  Inter-disciplinary care team collaboration (see longitudinal plan of care).  Patient Goals/Self-Care Activities:   Application for PCS (Personal Care Services) has been completed by patient's daughter, Kieffer Blatz, and faxed to [employer] for processing.     Application for Adult Medicaid has been completed by patient's daughter, Dusan Lipford, and faxed to the [place] for processing.  Patient and daughter both received the following resource information and applications in the mail:  2021 Medicaid Tips, Medicaid Application, Personal Care Services Instructions, Application and Agency Provider List.  Patient and daughter have been instructed to select 2 to 3 agencies that they would like to use, from the Agency Provider List, and be prepared to share those agencies with [employer], once approved for services.      Keep the Agency Provider List until your assessment is completed by [employer], and confirmation is received that you have been approved for services.  Return calls from [employer], or call them directly, if you have questions # [phone] or # [phone], or if you wish to check the status of your application.  Contact the [place], Division on [place] [phone]), to confirm receipt of your Adult Medicaid application, as well as to check the status of your application being processed. Follow-Up:  No Follow-Up Required.   Nat Christen, BSW, MSW, LCSW  Licensed Regulatory affairs officer [facility]  Mailing [place] [address] [place], [place] [address] Physical Address-[address] [place], [place] [address] Toll Free Main # [phone] Fax # [phone] Cell # [phone]  [email or web address]

## 2020-10-11 ENCOUNTER — Telehealth: Payer: Self-pay | Admitting: *Deleted

## 2020-10-11 DIAGNOSIS — E1122 Type 2 diabetes mellitus with diabetic chronic kidney disease: Secondary | ICD-10-CM | POA: Diagnosis not present

## 2020-10-11 DIAGNOSIS — I69398 Other sequelae of cerebral infarction: Secondary | ICD-10-CM | POA: Diagnosis not present

## 2020-10-11 DIAGNOSIS — Z87891 Personal history of nicotine dependence: Secondary | ICD-10-CM | POA: Diagnosis not present

## 2020-10-11 DIAGNOSIS — I1310 Hypertensive heart and chronic kidney disease without heart failure, with stage 1 through stage 4 chronic kidney disease, or unspecified chronic kidney disease: Secondary | ICD-10-CM | POA: Diagnosis not present

## 2020-10-11 DIAGNOSIS — E1142 Type 2 diabetes mellitus with diabetic polyneuropathy: Secondary | ICD-10-CM | POA: Diagnosis not present

## 2020-10-11 DIAGNOSIS — Z7902 Long term (current) use of antithrombotics/antiplatelets: Secondary | ICD-10-CM | POA: Diagnosis not present

## 2020-10-11 DIAGNOSIS — E7800 Pure hypercholesterolemia, unspecified: Secondary | ICD-10-CM | POA: Diagnosis not present

## 2020-10-11 DIAGNOSIS — Z794 Long term (current) use of insulin: Secondary | ICD-10-CM | POA: Diagnosis not present

## 2020-10-11 DIAGNOSIS — E876 Hypokalemia: Secondary | ICD-10-CM | POA: Diagnosis not present

## 2020-10-11 DIAGNOSIS — I252 Old myocardial infarction: Secondary | ICD-10-CM | POA: Diagnosis not present

## 2020-10-11 DIAGNOSIS — E785 Hyperlipidemia, unspecified: Secondary | ICD-10-CM | POA: Diagnosis not present

## 2020-10-11 DIAGNOSIS — I25110 Atherosclerotic heart disease of native coronary artery with unstable angina pectoris: Secondary | ICD-10-CM | POA: Diagnosis not present

## 2020-10-11 DIAGNOSIS — Z9181 History of falling: Secondary | ICD-10-CM | POA: Diagnosis not present

## 2020-10-11 DIAGNOSIS — Z955 Presence of coronary angioplasty implant and graft: Secondary | ICD-10-CM | POA: Diagnosis not present

## 2020-10-11 DIAGNOSIS — K219 Gastro-esophageal reflux disease without esophagitis: Secondary | ICD-10-CM | POA: Diagnosis not present

## 2020-10-11 DIAGNOSIS — G510 Bell's palsy: Secondary | ICD-10-CM | POA: Diagnosis not present

## 2020-10-11 DIAGNOSIS — N179 Acute kidney failure, unspecified: Secondary | ICD-10-CM | POA: Diagnosis not present

## 2020-10-11 DIAGNOSIS — N1832 Chronic kidney disease, stage 3b: Secondary | ICD-10-CM | POA: Diagnosis not present

## 2020-10-11 NOTE — Telephone Encounter (Signed)
Patient is calling to cancel his appointment for 10/12/20,please call back to reschedule.

## 2020-10-12 ENCOUNTER — Other Ambulatory Visit: Payer: Medicare Other

## 2020-10-12 ENCOUNTER — Ambulatory Visit: Payer: Self-pay | Admitting: Adult Health

## 2020-10-13 DIAGNOSIS — Z9181 History of falling: Secondary | ICD-10-CM | POA: Diagnosis not present

## 2020-10-13 DIAGNOSIS — E785 Hyperlipidemia, unspecified: Secondary | ICD-10-CM | POA: Diagnosis not present

## 2020-10-13 DIAGNOSIS — I69398 Other sequelae of cerebral infarction: Secondary | ICD-10-CM | POA: Diagnosis not present

## 2020-10-13 DIAGNOSIS — Z87891 Personal history of nicotine dependence: Secondary | ICD-10-CM | POA: Diagnosis not present

## 2020-10-13 DIAGNOSIS — N1832 Chronic kidney disease, stage 3b: Secondary | ICD-10-CM | POA: Diagnosis not present

## 2020-10-13 DIAGNOSIS — K219 Gastro-esophageal reflux disease without esophagitis: Secondary | ICD-10-CM | POA: Diagnosis not present

## 2020-10-13 DIAGNOSIS — Z794 Long term (current) use of insulin: Secondary | ICD-10-CM | POA: Diagnosis not present

## 2020-10-13 DIAGNOSIS — E1122 Type 2 diabetes mellitus with diabetic chronic kidney disease: Secondary | ICD-10-CM | POA: Diagnosis not present

## 2020-10-13 DIAGNOSIS — Z7902 Long term (current) use of antithrombotics/antiplatelets: Secondary | ICD-10-CM | POA: Diagnosis not present

## 2020-10-13 DIAGNOSIS — E876 Hypokalemia: Secondary | ICD-10-CM | POA: Diagnosis not present

## 2020-10-13 DIAGNOSIS — N179 Acute kidney failure, unspecified: Secondary | ICD-10-CM | POA: Diagnosis not present

## 2020-10-13 DIAGNOSIS — I252 Old myocardial infarction: Secondary | ICD-10-CM | POA: Diagnosis not present

## 2020-10-13 DIAGNOSIS — I1310 Hypertensive heart and chronic kidney disease without heart failure, with stage 1 through stage 4 chronic kidney disease, or unspecified chronic kidney disease: Secondary | ICD-10-CM | POA: Diagnosis not present

## 2020-10-13 DIAGNOSIS — E7800 Pure hypercholesterolemia, unspecified: Secondary | ICD-10-CM | POA: Diagnosis not present

## 2020-10-13 DIAGNOSIS — G510 Bell's palsy: Secondary | ICD-10-CM | POA: Diagnosis not present

## 2020-10-13 DIAGNOSIS — E1142 Type 2 diabetes mellitus with diabetic polyneuropathy: Secondary | ICD-10-CM | POA: Diagnosis not present

## 2020-10-13 DIAGNOSIS — I25110 Atherosclerotic heart disease of native coronary artery with unstable angina pectoris: Secondary | ICD-10-CM | POA: Diagnosis not present

## 2020-10-13 DIAGNOSIS — Z955 Presence of coronary angioplasty implant and graft: Secondary | ICD-10-CM | POA: Diagnosis not present

## 2020-10-16 ENCOUNTER — Ambulatory Visit: Payer: Medicare Other | Admitting: Adult Health

## 2020-10-17 ENCOUNTER — Ambulatory Visit (INDEPENDENT_AMBULATORY_CARE_PROVIDER_SITE_OTHER): Payer: Medicare Other | Admitting: *Deleted

## 2020-10-17 ENCOUNTER — Other Ambulatory Visit: Payer: Self-pay

## 2020-10-17 DIAGNOSIS — M2011 Hallux valgus (acquired), right foot: Secondary | ICD-10-CM

## 2020-10-17 DIAGNOSIS — E1142 Type 2 diabetes mellitus with diabetic polyneuropathy: Secondary | ICD-10-CM

## 2020-10-17 DIAGNOSIS — M2012 Hallux valgus (acquired), left foot: Secondary | ICD-10-CM

## 2020-10-17 DIAGNOSIS — M2141 Flat foot [pes planus] (acquired), right foot: Secondary | ICD-10-CM

## 2020-10-17 DIAGNOSIS — M2142 Flat foot [pes planus] (acquired), left foot: Secondary | ICD-10-CM

## 2020-10-17 NOTE — Progress Notes (Signed)
Patient presents to the office today for diabetic shoe and insole measuring.  Patient was measured with brannock device to determine size and width for 1 pair of extra depth shoes and foam casted for 3 pair of insoles.   Documentation of medical necessity will be sent to patient's treating diabetic doctor to verify and sign.   Patient's diabetic provider: Marlowe Sax, NP (under Dr. Unice Cobble)  Shoes and insoles will be ordered at that time and patient will be notified for an appointment for fitting when they arrive.   Shoe size (per patient): 8.5   Brannock measurement: 8E  Patient shoe selection-   1st choice:   Apex [id number]  2nd choice:  Apex [id number]  Shoe size ordered: Men's 8.5 X-Wide

## 2020-10-18 DIAGNOSIS — E7800 Pure hypercholesterolemia, unspecified: Secondary | ICD-10-CM | POA: Diagnosis not present

## 2020-10-18 DIAGNOSIS — I252 Old myocardial infarction: Secondary | ICD-10-CM | POA: Diagnosis not present

## 2020-10-18 DIAGNOSIS — G510 Bell's palsy: Secondary | ICD-10-CM | POA: Diagnosis not present

## 2020-10-18 DIAGNOSIS — Z9181 History of falling: Secondary | ICD-10-CM | POA: Diagnosis not present

## 2020-10-18 DIAGNOSIS — Z794 Long term (current) use of insulin: Secondary | ICD-10-CM | POA: Diagnosis not present

## 2020-10-18 DIAGNOSIS — E785 Hyperlipidemia, unspecified: Secondary | ICD-10-CM | POA: Diagnosis not present

## 2020-10-18 DIAGNOSIS — E1122 Type 2 diabetes mellitus with diabetic chronic kidney disease: Secondary | ICD-10-CM | POA: Diagnosis not present

## 2020-10-18 DIAGNOSIS — Z7902 Long term (current) use of antithrombotics/antiplatelets: Secondary | ICD-10-CM | POA: Diagnosis not present

## 2020-10-18 DIAGNOSIS — E876 Hypokalemia: Secondary | ICD-10-CM | POA: Diagnosis not present

## 2020-10-18 DIAGNOSIS — K219 Gastro-esophageal reflux disease without esophagitis: Secondary | ICD-10-CM | POA: Diagnosis not present

## 2020-10-18 DIAGNOSIS — N179 Acute kidney failure, unspecified: Secondary | ICD-10-CM | POA: Diagnosis not present

## 2020-10-18 DIAGNOSIS — E1142 Type 2 diabetes mellitus with diabetic polyneuropathy: Secondary | ICD-10-CM | POA: Diagnosis not present

## 2020-10-18 DIAGNOSIS — I25110 Atherosclerotic heart disease of native coronary artery with unstable angina pectoris: Secondary | ICD-10-CM | POA: Diagnosis not present

## 2020-10-18 DIAGNOSIS — I69398 Other sequelae of cerebral infarction: Secondary | ICD-10-CM | POA: Diagnosis not present

## 2020-10-18 DIAGNOSIS — Z955 Presence of coronary angioplasty implant and graft: Secondary | ICD-10-CM | POA: Diagnosis not present

## 2020-10-18 DIAGNOSIS — N1832 Chronic kidney disease, stage 3b: Secondary | ICD-10-CM | POA: Diagnosis not present

## 2020-10-18 DIAGNOSIS — I1310 Hypertensive heart and chronic kidney disease without heart failure, with stage 1 through stage 4 chronic kidney disease, or unspecified chronic kidney disease: Secondary | ICD-10-CM | POA: Diagnosis not present

## 2020-10-18 DIAGNOSIS — Z87891 Personal history of nicotine dependence: Secondary | ICD-10-CM | POA: Diagnosis not present

## 2020-10-20 DIAGNOSIS — E1122 Type 2 diabetes mellitus with diabetic chronic kidney disease: Secondary | ICD-10-CM | POA: Diagnosis not present

## 2020-10-20 DIAGNOSIS — Z9181 History of falling: Secondary | ICD-10-CM | POA: Diagnosis not present

## 2020-10-20 DIAGNOSIS — N179 Acute kidney failure, unspecified: Secondary | ICD-10-CM | POA: Diagnosis not present

## 2020-10-20 DIAGNOSIS — Z87891 Personal history of nicotine dependence: Secondary | ICD-10-CM | POA: Diagnosis not present

## 2020-10-20 DIAGNOSIS — G510 Bell's palsy: Secondary | ICD-10-CM | POA: Diagnosis not present

## 2020-10-20 DIAGNOSIS — I252 Old myocardial infarction: Secondary | ICD-10-CM | POA: Diagnosis not present

## 2020-10-20 DIAGNOSIS — I1310 Hypertensive heart and chronic kidney disease without heart failure, with stage 1 through stage 4 chronic kidney disease, or unspecified chronic kidney disease: Secondary | ICD-10-CM | POA: Diagnosis not present

## 2020-10-20 DIAGNOSIS — E7800 Pure hypercholesterolemia, unspecified: Secondary | ICD-10-CM | POA: Diagnosis not present

## 2020-10-20 DIAGNOSIS — Z7902 Long term (current) use of antithrombotics/antiplatelets: Secondary | ICD-10-CM | POA: Diagnosis not present

## 2020-10-20 DIAGNOSIS — I25110 Atherosclerotic heart disease of native coronary artery with unstable angina pectoris: Secondary | ICD-10-CM | POA: Diagnosis not present

## 2020-10-20 DIAGNOSIS — I69398 Other sequelae of cerebral infarction: Secondary | ICD-10-CM | POA: Diagnosis not present

## 2020-10-20 DIAGNOSIS — E785 Hyperlipidemia, unspecified: Secondary | ICD-10-CM | POA: Diagnosis not present

## 2020-10-20 DIAGNOSIS — E1142 Type 2 diabetes mellitus with diabetic polyneuropathy: Secondary | ICD-10-CM | POA: Diagnosis not present

## 2020-10-20 DIAGNOSIS — N1832 Chronic kidney disease, stage 3b: Secondary | ICD-10-CM | POA: Diagnosis not present

## 2020-10-20 DIAGNOSIS — Z794 Long term (current) use of insulin: Secondary | ICD-10-CM | POA: Diagnosis not present

## 2020-10-20 DIAGNOSIS — K219 Gastro-esophageal reflux disease without esophagitis: Secondary | ICD-10-CM | POA: Diagnosis not present

## 2020-10-20 DIAGNOSIS — Z955 Presence of coronary angioplasty implant and graft: Secondary | ICD-10-CM | POA: Diagnosis not present

## 2020-10-20 DIAGNOSIS — E876 Hypokalemia: Secondary | ICD-10-CM | POA: Diagnosis not present

## 2020-10-24 ENCOUNTER — Other Ambulatory Visit: Payer: Self-pay

## 2020-10-24 ENCOUNTER — Other Ambulatory Visit: Payer: Self-pay | Admitting: Cardiology

## 2020-10-24 DIAGNOSIS — Z8673 Personal history of transient ischemic attack (TIA), and cerebral infarction without residual deficits: Secondary | ICD-10-CM

## 2020-10-24 DIAGNOSIS — Z955 Presence of coronary angioplasty implant and graft: Secondary | ICD-10-CM

## 2020-10-24 MED ORDER — CLOPIDOGREL BISULFATE 75 MG PO TABS: 75.0000 mg | ORAL_TABLET | Freq: Every day | ORAL | 0 refills | Status: DC

## 2020-11-03 ENCOUNTER — Other Ambulatory Visit: Payer: Self-pay | Admitting: *Deleted

## 2020-11-03 NOTE — Patient Outreach (Signed)
[facility]) Care Management  11/03/2020  Ferguson Gertner 1946-01-24 [id number]   Outgoing call placed to [facility] daughter Con Memos, state member is doing "great."  Report he has improved with ability to care for himself, does not feel he is in need of personal care aide at this time but confirms that she has information for the future.  They have also declined to have mobile meals, she and her sister has been more involved in member's care.  Denies any urgent concerns, encouraged to contact this care manager with questions.  Agrees to follow up within the next month.  Goals Addressed            This Visit's Progress   . [facility] - Make and Keep All Appointments   On track    Timeframe:  Short-Term Goal Priority:  Medium Start Date:     5/5                      Expected End Date:  6/5                     Barriers: Knowledge  Follow Up Date 6/2    - call to cancel if needed - keep a calendar with appointment dates    Why is this important?    Part of staying healthy is seeing the doctor for follow-up care.   If you forget your appointments, there are some things you can do to stay on track.    Notes:   5/5 - Verified with daughter that she would be keeping up with all member's appointments and providing transportation.  Appointment for diabetic shoes on 5/12  6/3 - Cardiology follow up on 6/14, follow up for diabetic foot exam on 8/8.  Will also have more teeth removed next week, preparing to be fitted for dentures    . [facility] - Prevent Falls and Injury   On track    Follow Up Date 6/2  Timeframe:  Long-Range Goal Priority:  High Start Date:           5/5                  Expected End Date:    7/5  Barriers: Health Behaviors Knowledge                          - always use handrails on the stairs - keep my cell phone with me always - learn how to get back up if I fall - use a nonslip pad with throw rugs, or remove them completely - use a nightlight in the  bathroom - wear my glasses and/or hearing aid    Why is this important?    Most falls happen when it is hard for you to walk safely. Your balance may be off because of an illness. You may have pain in your knees, hip or other joints.   You may be overly tired or taking medicines that make you sleepy. You may not be able to see or hear clearly.   Falls can lead to broken bones, bruises or other injuries.   There are things you can do to help prevent falling.     Notes:   5/5 - Education to prevent falls sent to member/daughter  5/3 - Daughter report he has completed work with PT, has increased in strength      [employer], Therapist, sports, MSN [facility]  Care Management  [facility] Manager [phone]

## 2020-11-06 ENCOUNTER — Other Ambulatory Visit: Payer: Self-pay | Admitting: *Deleted

## 2020-11-06 MED ORDER — HYDROCODONE-ACETAMINOPHEN 10-325 MG PO TABS: 1.0000 | ORAL_TABLET | Freq: Three times a day (TID) | ORAL | 0 refills | Status: DC | PRN

## 2020-11-06 NOTE — Telephone Encounter (Signed)
Patient daughter requested refill Epic LR: 10/06/2020 Contract on [employer] and sent to Amy due to Webb Silversmith out of office.

## 2020-11-14 ENCOUNTER — Encounter: Payer: Self-pay | Admitting: Cardiology

## 2020-11-14 ENCOUNTER — Other Ambulatory Visit: Payer: Self-pay

## 2020-11-14 ENCOUNTER — Ambulatory Visit: Payer: Medicare Other | Admitting: Cardiology

## 2020-11-14 VITALS — BP 147/75 | HR 75 | Temp 98.3°F | Resp 16 | Ht 67.0 in | Wt 264.0 lb

## 2020-11-14 DIAGNOSIS — E1159 Type 2 diabetes mellitus with other circulatory complications: Secondary | ICD-10-CM

## 2020-11-14 DIAGNOSIS — Z794 Long term (current) use of insulin: Secondary | ICD-10-CM | POA: Diagnosis not present

## 2020-11-14 DIAGNOSIS — Z8673 Personal history of transient ischemic attack (TIA), and cerebral infarction without residual deficits: Secondary | ICD-10-CM

## 2020-11-14 DIAGNOSIS — E7800 Pure hypercholesterolemia, unspecified: Secondary | ICD-10-CM

## 2020-11-14 DIAGNOSIS — Z955 Presence of coronary angioplasty implant and graft: Secondary | ICD-10-CM

## 2020-11-14 DIAGNOSIS — Z87891 Personal history of nicotine dependence: Secondary | ICD-10-CM

## 2020-11-14 DIAGNOSIS — I119 Hypertensive heart disease without heart failure: Secondary | ICD-10-CM | POA: Diagnosis not present

## 2020-11-14 DIAGNOSIS — Z951 Presence of aortocoronary bypass graft: Secondary | ICD-10-CM

## 2020-11-14 DIAGNOSIS — I2510 Atherosclerotic heart disease of native coronary artery without angina pectoris: Secondary | ICD-10-CM

## 2020-11-14 MED ORDER — CLOPIDOGREL BISULFATE 75 MG PO TABS: 75.0000 mg | ORAL_TABLET | Freq: Every day | ORAL | 0 refills | Status: DC

## 2020-11-14 NOTE — Progress Notes (Signed)
David Burton Date of Birth: 1945-06-07 MRN: [id number] Primary Care Provider:[name], [name], NP Former Cardiology Providers: Dr. Adrian Prows, Jeri Lager, APRN, FNP-C Primary Cardiologist: [name], DO, [facility] (established care 01/11/2020)  Date: 11/14/20 Last Office Visit: 03/10/2020   Chief Complaint  Patient presents with   Follow-up    8 month follow up heart disease and lipids     HPI  David Burton is a 75 y.o.  male who presents to the office with a chief complaint of " 8 month follow up for heart disease and lipid management." Patient's past medical history and cardiovascular risk factors include: NSTEMI in Dec 2018, s/p CABG x 4 on 05/08/2017 (LIMA to LAD, SVG to PDA, SVG to Diagonal, SVG to Ramus. Surgery done at [place]), diabetes mellitus type 2 with peripheral neuropathy, hypertension, hypercholesterolemia, CVA (09/2019), advanced age.  Previously under the care of Miquel Dunn and is being followed in the clinic for his underlying CAD status post surgical revascularization and lipid management.  Despite being on statin therapy and Zetia patient's LDL remains 124 mg/dL and therefore was started on Repatha and thereafter LDL was 79 mg/dL.  Patient has also worked on glycemic control and his A1c has improved.  Since last office visit he is in the process of getting his teeth extracted in a staged manner.  However, unfortunately had a tongue laceration which has affected his ability to consume caloric intake.  However from a cardiovascular standpoint he remains asymptomatic since last office visit.  He denies any chest pain or shortness of breath at rest or with effort related activities.  He denies any hospitalizations for cardiovascular symptoms.  Last use of sublingual nitroglycerin tablets was approximately 3 to 4 months ago.  FUNCTIONAL STATUS: He walks atleast 0.5 miles per day.    ALLERGIES: No Known Allergies  MEDICATION LIST PRIOR TO VISIT: Current  Outpatient Medications on File Prior to Visit  Medication Sig Dispense Refill   amLODipine (NORVASC) 5 MG tablet Take 1 tablet by mouth daily.     amoxicillin (AMOXIL) 500 MG tablet Take 500 mg by mouth 3 (three) times daily.     atorvastatin (LIPITOR) 20 MG tablet Take 1 tablet (20 mg total) by mouth daily. 90 tablet 0   BD PEN NEEDLE NANO 2ND GEN 32G X 4 MM MISC USE 1 PEN THREE TIMES DAILY AS DIRECTED 100 each 3   ezetimibe (ZETIA) 10 MG tablet Take 1 tablet (10 mg total) by mouth daily. 30 tablet 0   glucose blood (ACCU-CHEK AVIVA PLUS) test strip Use to test blood sugar 3 times daily. Dx: E11.42 300 each 1   HYDROcodone-acetaminophen (NORCO) 10-325 MG tablet Take 1 tablet by mouth 3 (three) times daily as needed. 90 tablet 0   ibuprofen (ADVIL) 600 MG tablet Take 600 mg by mouth every 6 (six) hours as needed.     insulin detemir (LEVEMIR) 100 UNIT/ML FlexPen Inject 20 Units into the skin at bedtime. 30 mL 1   insulin lispro (HUMALOG KWIKPEN) 100 UNIT/ML KwikPen Inject 8-10 units into the skin per sliding scale three times daily with meals. 45 mL 3   isosorbide mononitrate (IMDUR) 60 MG 24 hr tablet TAKE 1 TABLET(60 MG) BY MOUTH DAILY 90 tablet 1   magic mouthwash w/lidocaine SOLN Take 2 mLs by mouth 3 (three) times daily as needed for mouth pain. 15 mL 0   nitroGLYCERIN (NITROSTAT) 0.4 MG SL tablet Dissolve one tablet under tongue as needed for chest pain. (  Patient taking differently: Place 0.4 mg under the tongue every 5 (five) minutes as needed for chest pain.) 30 tablet 3   NOVOLOG FLEXPEN 100 UNIT/ML FlexPen SMARTSIG:8-10 Unit(s) SUB-Q 3 Times Daily     phenol (CHLORASEPTIC) 1.4 % LIQD Use as directed 1 spray in the mouth or throat as needed for throat irritation / pain. 20 mL 0   REPATHA 140 MG/ML SOSY Inject 140 mg into the skin every 14 (fourteen) days. 2.1 mL 3   amLODipine (NORVASC) 10 MG tablet Take 1 tablet (10 mg total) by mouth daily. (Patient not taking: Reported on 11/14/2020) 30  tablet 0   metoprolol tartrate (LOPRESSOR) 25 MG tablet Take 0.5 tablets (12.5 mg total) by mouth 2 (two) times daily. 30 tablet 0   No current facility-administered medications on file prior to visit.    PAST MEDICAL HISTORY: Past Medical History:  Diagnosis Date   Bell palsy    CKD (chronic kidney disease), stage III ([place]) 12/16/2018   Coronary artery disease involving native coronary artery of native heart with unstable angina pectoris ([place]) 12/16/2018   GERD (gastroesophageal reflux disease)    Hypertension    Hypertension with heart disease 12/16/2018   Myocardial infarction ([place])    Pure hypercholesterolemia 12/16/2018   Stroke ([place])    Type 2 diabetes, controlled, with peripheral neuropathy ([place]) 12/16/2018    PAST SURGICAL HISTORY: Past Surgical History:  Procedure Laterality Date   CARDIAC SURGERY     COLONOSCOPY WITH PROPOFOL N/A 01/02/2018   Procedure: COLONOSCOPY WITH PROPOFOL;  Surgeon: Carol Ada, MD;  Location: [facility];  Service: Endoscopy;  Laterality: N/A;   CORONARY STENT INTERVENTION N/A 01/26/2019   Procedure: CORONARY STENT INTERVENTION;  Surgeon: Adrian Prows, MD;  Location: [facility];  Service: Cardiovascular;  Laterality: N/A;   LEFT HEART CATH AND CORS/GRAFTS ANGIOGRAPHY N/A 01/05/2019   Procedure: LEFT HEART CATH AND CORS/GRAFTS ANGIOGRAPHY;  Surgeon: Adrian Prows, MD;  Location: [facility];  Service: Cardiovascular;  Laterality: N/A;   POLYPECTOMY  01/02/2018   Procedure: POLYPECTOMY;  Surgeon: Carol Ada, MD;  Location: [facility];  Service: Endoscopy;;   stents      FAMILY HISTORY: The patient's family history includes Cancer in his mother; Heart attack in his father.   SOCIAL HISTORY:  The patient  reports that he quit smoking about 17 years ago. His smoking use included cigarettes. He has a 5.00 pack-year smoking history. He has never used smokeless tobacco. He reports previous alcohol use. He reports that he does not use  drugs.  Review of Systems  Constitutional: Negative for chills and fever.  HENT:  Negative for hoarse voice and nosebleeds.   Eyes:  Negative for discharge, double vision and pain.  Cardiovascular:  Positive for dyspnea on exertion (stable). Negative for chest pain, claudication, leg swelling, near-syncope, orthopnea, palpitations, paroxysmal nocturnal dyspnea and syncope.  Respiratory:  Negative for hemoptysis and shortness of breath.   Musculoskeletal:  Negative for muscle cramps and myalgias.  Gastrointestinal:  Negative for abdominal pain, constipation, diarrhea, hematemesis, hematochezia, melena, nausea and vomiting.  Neurological:  Positive for focal weakness (left leg weakness (residual from recent CVA) improving. ). Negative for dizziness and light-headedness.  PHYSICAL EXAM: Vitals with BMI 11/14/2020 09/15/2020 09/15/2020  Height 5' 7"  - -  Weight 264 lbs - -  BMI 40.08 - -  Systolic 676 195 093  Diastolic 75 70 76  Pulse 75 76 85   CONSTITUTIONAL: Well-developed and well-nourished. No acute distress.  SKIN: Skin is warm and dry. No rash noted. No cyanosis. No pallor. No jaundice HEAD: Normocephalic and atraumatic.  EYES: No scleral icterus MOUTH/THROAT: Moist oral membranes.  NECK: No JVD present. No thyromegaly noted. No carotid bruits  LYMPHATIC: No visible cervical adenopathy.  CHEST Normal respiratory effort. No intercostal retractions  LUNGS: Clear to auscultation bilaterally.  No stridor. No wheezes. No rales.  CARDIOVASCULAR: Regular rate and rhythm, positive S1-S2, no murmurs rubs or gallops appreciated. ABDOMINAL: No apparent ascites.  EXTREMITIES: No peripheral edema  HEMATOLOGIC: No significant bruising NEUROLOGIC: Oriented to person, place, and time. Nonfocal. Normal muscle tone.  PSYCHIATRIC: Normal mood and affect. Normal behavior. Cooperative  RADIOLOGY: MR Brain w/o contrast:  IMPRESSION: 1. Mildly motion degraded exam. 2. Confirmed 17 mm acute/early  subacute infarct within the right pons. 3. Background mild chronic small vessel ischemic disease within the cerebral white matter and pons. Tiny chronic lacunar infarct within the left cerebellum. 4. Mild generalized parenchymal atrophy. 5. Mild paranasal sinus mucosal thickening. Trace left mastoid effusion.  CARDIAC DATABASE: Coronary artery bypass grafting: s/p CABG x 4 on 05/08/2017 (LIMA to LAD, SVG to PDA, SVG to Diagonal, SVG to Ramus.  EKG: 11/14/2020: Normal sinus rhythm, 79 bpm, left axis deviation, inferior infarct, incomplete right bundle branch block, without underlying ischemia or injury pattern, occasional PVCs.  Echocardiogram: 09/17/2019: LVEF 55 to 60%, no regional wall motion arise, grade 2 diastolic impairment, elevated LVEDP, right ventricular size mildly dilated, biatrial dilatation, trivial MR, aortic valve sclerosis without stenosis.  Stress Testing:  Lexiscan myoview stress test 10/10/2017: 1. Lexiscan stress test was performed. Exercise capacity was not assessed. Stress symptoms included dizziness. Normal blood pressure. The resting electrocardiogram demonstrated normal sinus rhythm, normal resting conduction, no resting arrhythmias and normal rest repolarization. Stress EKG is non diagnostic for ischemia as it is a pharmacologic stress. 2. The overall quality of the study is good. Left ventricular cavity is noted to be normal on the rest and stress studies. Gated SPECT images reveal normal myocardial thickening and wall motion, except mild inferior hypokinesis. The left ventricular ejection fraction was calculated or visually estimated to be 53%. SPECT stress images demonstrate medium size, moderate intensity perfusion defect in basal to apical inferior, inferolateral myocardium. SPECT rest images demonstrate medium size, mild intensity perfusion defect in basal to apical inferior myocardium. This suggests inferior infarct with periinfarct mild ischemia, as well as  inferolateral ischemia. (LCx/PDA territory). 3. Intermediate risk study.  Heart Catheterization: Coronary angiogram 01/05/2019: Normal LVEDP, severe native vessel disease.  High-grade proximal RCA 90 to 95% stenosis followed by 70 to 80% stenosis in the in-stent restenotic proximal segment.  Large RCA.  SVG to RCA could not be visualized in spite of multiple catheter attempts, and presumed occluded. Left main is large and mildly diseased but patent.  LAD is occluded in the midsegment after the origin of D1, which has ostial 90% stenosis.  SVG to D1 is patent.  LIMA to LAD is patent. Ramus intermediate: Large vessel, mild to moderate calcific 80 to 85% stenosis in the proximal segment.  SVG to ramus intermediate is occluded. Circumflex: High-grade 99%/subtotal occlusion in the ostium.  Moderate to large sized vessel.  Small marginals.  Coronary intervention 01/26/2019: Successful PTCA and stenting of the proximal mid and mid to distal RCA with implantation of 2 overlapping 3.0 x 38 mm Promus Synergy DES, stenosis reduced from 90% to 0% with TIMI-3 to TIMI-3 flow.     Recommendation: With medical high-grade ramus intermediate and  circumflex stenosis, he has excellent brisk flow there and ischemia was in the inferior wall, however if he still persist to have recurrence of angina pectoris we will schedule him for complex intervention to the left coronary system.  In view of his underlying comorbidity, would recommend medical therapy  Carotid duplex: 09/17/2019: Right Carotid: Velocities in the right ICA are consistent with a 1-39% stenosis.  Left Carotid: Velocities in the left ICA are consistent with a 1-39% stenosis.  Vertebrals: Left vertebral artery demonstrates antegrade flow. Right vertebral artery was not visualized  14 - day [facility] Ambulatory Telemetry.  Dominant rhythm normal sinus. Heart rate 59-156 bpm.  Avg HR 85 bpm. No atrial fibrillation, ventricular tachycardia, high grade  AV block, pauses (3 seconds or longer). Total ventricular and supraventricular ectopic burden <1%. Two episode of supraventricular tachycardia. Longest and fastest episode was 17 beats at maximum HR of 156 bpm. Patient triggered events: 0.  LABORATORY DATA: CBC Latest Ref Rng & Units 09/15/2020 09/14/2020 09/12/2020  WBC 4.0 - 10.5 K/uL 10.5 8.9 8.3  Hemoglobin 13.0 - 17.0 g/dL 12.4(L) 12.0(L) 13.0  Hematocrit 39.0 - 52.0 % 35.0(L) 33.5(L) 35.3(L)  Platelets 150 - 400 K/uL 178 156 155    CMP Latest Ref Rng & Units 09/15/2020 09/14/2020 09/13/2020  Glucose 70 - 99 mg/dL 175(H) 176(H) -  BUN 8 - 23 mg/dL 24(H) 38(H) -  Creatinine 0.61 - 1.24 mg/dL 1.43(H) 1.62(H) -  Sodium 135 - 145 mmol/L 138 140 -  Potassium 3.5 - 5.1 mmol/L 3.7 3.4(L) 3.7  Chloride 98 - 111 mmol/L 113(H) 114(H) -  CO2 22 - 32 mmol/L 17(L) 18(L) -  Calcium 8.9 - 10.3 mg/dL 9.1 8.9 -  Total Protein 6.5 - 8.1 g/dL 7.0 6.4(L) -  Total Bilirubin 0.3 - 1.2 mg/dL 0.7 0.6 -  Alkaline Phos 38 - 126 U/L 58 53 -  AST 15 - 41 U/L 25 19 -  ALT 0 - 44 U/L 30 21 -   Last lipids Lab Results  Component Value Date   CHOL 143 09/14/2020   HDL 25 (L) 09/14/2020   LDLCALC 91 09/14/2020   TRIG 134 09/14/2020   CHOLHDL 5.7 09/14/2020    Lab Results  Component Value Date   HGBA1C 8.1 (H) 09/06/2020   HGBA1C 6.6 (H) 05/08/2020   HGBA1C 8.8 (H) 03/02/2020   No components found for: NTPROBNP Lab Results  Component Value Date   TSH 1.63 09/06/2020   TSH 3.02 05/08/2020   TSH 4.56 (H) 03/02/2020    Cardiac Panel (last 3 results) No results for input(s): CKTOTAL, CKMB, TROPONINIHS, RELINDX in the last 72 hours.  IMPRESSION:    ICD-10-CM   1. Coronary artery disease involving native coronary artery of native heart without angina pectoris  I25.10 CMP14+EGFR    Lipid Panel With LDL/HDL Ratio    LDL cholesterol, direct    2. Hypertension with heart disease  I11.9 EKG 12-Lead    3. S/P CABG x 4  Z95.1     4. S/P primary  angioplasty with coronary stent  Z95.5 clopidogrel (PLAVIX) 75 MG tablet    5. History of stroke  Z86.73 clopidogrel (PLAVIX) 75 MG tablet    6. Pure hypercholesterolemia  E78.00 CMP14+EGFR    Lipid Panel With LDL/HDL Ratio    LDL cholesterol, direct    7. Former smoker  Z87.891     77. Class 3 severe obesity due to excess calories with serious comorbidity and body mass index (BMI) of  40.0 to 44.9 in adult ([place])  E66.01    Z68.41     9. Type 2 diabetes mellitus with other circulatory complication, with long-term current use of insulin (HCC)  E11.59    Z79.4     10. Long-term insulin use ([facility])  Z79.4        RECOMMENDATIONS: Marlow Berenguer is a 75 y.o. male whose past medical history and cardiovascular risk factors include: NSTEMI in Dec 2018, s/p CABG x 4 on 05/08/2017 (LIMA to LAD, SVG to PDA, SVG to Diagonal, SVG to Ramus. Surgery done at [facility]), diabetes mellitus type 2 with peripheral neuropathy, hypertension, hypercholesterolemia, CVA (09/2019), advanced age.  Established coronary artery disease with prior history of four-vessel CABG without angina pectoris: Stable No recent use of sublingual nitroglycerin tablets. Asymptomatic Educated on the importance of improving his modifiable cardiovascular risk factors as part of secondary prevention. Check CMP and fasting lipid profile as he is currently on Repatha. Glycemic control as per his other providers. Recommend better blood pressure management.  However difficult to uptitrate medical therapy as he is not able to help reconcile his medications at today's office visit.  Patient states that his daughter will call the office and update his list in the next coming days. Encouraged ambulating at least 30 minutes a day 5 days a week of moderate intensity exercise as tolerated.  History of CABG: See above  Recent stroke with residual deficits: Follows with neurology. Educated on importance of secondary  prevention.  Pure hypercholesterolemia: Continue Crestor and Zetia. Continue Repatha Will check fasting lipid profile and liver function test.  Benign essential hypertension with chronic kidney disease stage 3 Office blood pressure is not at goal.  Medication difficult to reconcile as noted above. Patient states that his daughter will call the office to update his medication list.  He is asked to call the office if his systolic blood pressures at home are consistently greater than 130 mmHg. Low salt diet recommended. A diet that is rich in fruits, vegetables, legumes, and low-fat dairy products and low in snacks, sweets, and meats (such as the Dietary Approaches to Stop Hypertension [DASH] diet).   Former smoker: Educated on the importance of continued smoking cessation.  Obesity, due to excess calories: Body mass index is 41.35 kg/m. I reviewed with the patient the importance of diet, regular physical activity/exercise, weight loss.   Patient is educated on increasing physical activity gradually as tolerated.  With the goal of moderate intensity exercise for 30 minutes a day 5 days a week.  FINAL MEDICATION LIST END OF ENCOUNTER: Meds ordered this encounter  Medications   clopidogrel (PLAVIX) 75 MG tablet    Sig: Take 1 tablet (75 mg total) by mouth daily. Patient needs appointment for additional refills.    Dispense:  90 tablet    Refill:  0      Current Outpatient Medications:    amLODipine (NORVASC) 5 MG tablet, Take 1 tablet by mouth daily., Disp: , Rfl:    amoxicillin (AMOXIL) 500 MG tablet, Take 500 mg by mouth 3 (three) times daily., Disp: , Rfl:    atorvastatin (LIPITOR) 20 MG tablet, Take 1 tablet (20 mg total) by mouth daily., Disp: 90 tablet, Rfl: 0   BD PEN NEEDLE NANO 2ND GEN 32G X 4 MM MISC, USE 1 PEN THREE TIMES DAILY AS DIRECTED, Disp: 100 each, Rfl: 3   ezetimibe (ZETIA) 10 MG tablet, Take 1 tablet (10 mg total) by mouth daily., Disp: 30  tablet, Rfl: 0   glucose  blood (ACCU-CHEK AVIVA PLUS) test strip, Use to test blood sugar 3 times daily. Dx: E11.42, Disp: 300 each, Rfl: 1   HYDROcodone-acetaminophen (NORCO) 10-325 MG tablet, Take 1 tablet by mouth 3 (three) times daily as needed., Disp: 90 tablet, Rfl: 0   ibuprofen (ADVIL) 600 MG tablet, Take 600 mg by mouth every 6 (six) hours as needed., Disp: , Rfl:    insulin detemir (LEVEMIR) 100 UNIT/ML FlexPen, Inject 20 Units into the skin at bedtime., Disp: 30 mL, Rfl: 1   insulin lispro (HUMALOG KWIKPEN) 100 UNIT/ML KwikPen, Inject 8-10 units into the skin per sliding scale three times daily with meals., Disp: 45 mL, Rfl: 3   isosorbide mononitrate (IMDUR) 60 MG 24 hr tablet, TAKE 1 TABLET(60 MG) BY MOUTH DAILY, Disp: 90 tablet, Rfl: 1   magic mouthwash w/lidocaine SOLN, Take 2 mLs by mouth 3 (three) times daily as needed for mouth pain., Disp: 15 mL, Rfl: 0   nitroGLYCERIN (NITROSTAT) 0.4 MG SL tablet, Dissolve one tablet under tongue as needed for chest pain. (Patient taking differently: Place 0.4 mg under the tongue every 5 (five) minutes as needed for chest pain.), Disp: 30 tablet, Rfl: 3   NOVOLOG FLEXPEN 100 UNIT/ML FlexPen, SMARTSIG:8-10 Unit(s) SUB-Q 3 Times Daily, Disp: , Rfl:    phenol (CHLORASEPTIC) 1.4 % LIQD, Use as directed 1 spray in the mouth or throat as needed for throat irritation / pain., Disp: 20 mL, Rfl: 0   REPATHA 140 MG/ML SOSY, Inject 140 mg into the skin every 14 (fourteen) days., Disp: 2.1 mL, Rfl: 3   amLODipine (NORVASC) 10 MG tablet, Take 1 tablet (10 mg total) by mouth daily. (Patient not taking: Reported on 11/14/2020), Disp: 30 tablet, Rfl: 0   clopidogrel (PLAVIX) 75 MG tablet, Take 1 tablet (75 mg total) by mouth daily. Patient needs appointment for additional refills., Disp: 90 tablet, Rfl: 0   metoprolol tartrate (LOPRESSOR) 25 MG tablet, Take 0.5 tablets (12.5 mg total) by mouth 2 (two) times daily., Disp: 30 tablet, Rfl: 0  Orders Placed This Encounter  Procedures    CMP14+EGFR   Lipid Panel With LDL/HDL Ratio   LDL cholesterol, direct   EKG 12-Lead    --Continue cardiac medications as reconciled in final medication list. --Return in about 3 months (around 02/14/2021) for Follow up, CAD. Or sooner if needed. --Continue follow-up with your primary care physician regarding the management of your other chronic comorbid conditions.  Patient's questions and concerns were addressed to his satisfaction. He voices understanding of the instructions provided during this encounter.   This note was created using a voice recognition software as a result there may be grammatical errors inadvertently enclosed that do not reflect the nature of this encounter. Every attempt is made to correct such errors.  Total time spent: 30 minutes.  [name], [place], [facility]  Pager: [phone] Office: [phone]

## 2020-11-20 ENCOUNTER — Telehealth: Payer: Self-pay

## 2020-11-20 NOTE — Telephone Encounter (Signed)
Pt daughter called and would like for you to call her regarding Pts BP. She was unable to come to the last appointment and would like some advice on how exactly she is suppose to be managing the pts blood pressure.

## 2020-11-23 ENCOUNTER — Encounter: Payer: Self-pay | Admitting: Family

## 2020-11-24 NOTE — Telephone Encounter (Signed)
Spoke to [place] over the phone.  Educated to keep his BP around 194mmHg and to call the office if meds need to be titrated or call his PCP.  She voiced understanding.

## 2020-11-27 LAB — HM DIABETES EYE EXAM

## 2020-12-05 ENCOUNTER — Other Ambulatory Visit: Payer: Self-pay

## 2020-12-05 MED ORDER — HYDROCODONE-ACETAMINOPHEN 10-325 MG PO TABS: 1.0000 | ORAL_TABLET | Freq: Three times a day (TID) | ORAL | 0 refills | Status: DC | PRN

## 2020-12-05 NOTE — Telephone Encounter (Signed)
RX last filled on 11/06/2020  Treatment agreement on file from January 2022

## 2020-12-08 ENCOUNTER — Ambulatory Visit (INDEPENDENT_AMBULATORY_CARE_PROVIDER_SITE_OTHER): Payer: Medicare Other | Admitting: Family

## 2020-12-08 ENCOUNTER — Other Ambulatory Visit: Payer: Self-pay | Admitting: *Deleted

## 2020-12-08 ENCOUNTER — Encounter: Payer: Self-pay | Admitting: Family

## 2020-12-08 ENCOUNTER — Other Ambulatory Visit: Payer: Self-pay

## 2020-12-08 VITALS — BP 142/96 | HR 77 | Temp 97.3°F | Resp 20 | Ht 67.0 in | Wt 259.8 lb

## 2020-12-08 DIAGNOSIS — I129 Hypertensive chronic kidney disease with stage 1 through stage 4 chronic kidney disease, or unspecified chronic kidney disease: Secondary | ICD-10-CM

## 2020-12-08 DIAGNOSIS — N183 Chronic kidney disease, stage 3 unspecified: Secondary | ICD-10-CM | POA: Diagnosis not present

## 2020-12-08 DIAGNOSIS — Z66 Do not resuscitate: Secondary | ICD-10-CM | POA: Diagnosis not present

## 2020-12-08 MED ORDER — METOPROLOL TARTRATE 25 MG PO TABS
25.0000 mg | ORAL_TABLET | Freq: Two times a day (BID) | ORAL | 0 refills | Status: DC
Start: 2020-12-08 — End: 2021-03-05

## 2020-12-08 NOTE — Patient Outreach (Signed)
[facility]) Care Management  [place]  12/08/2020   Dink Creps 1946-04-27 [id number]   Outgoing call placed to member's daughter, state this is not a good time to talk.  Call then placed to member, report he is doing well.  Denies any ongoing issues with dental work, able to eat and drink appropriately.  Denies any urgent concerns, encouraged to contact this care manager with questions.   Encounter Medications:  Outpatient Encounter Medications as of 12/08/2020  Medication Sig   amLODipine (NORVASC) 10 MG tablet Take 1 tablet (10 mg total) by mouth daily.   amoxicillin (AMOXIL) 500 MG tablet Take 500 mg by mouth 3 (three) times daily.   atorvastatin (LIPITOR) 20 MG tablet Take 1 tablet (20 mg total) by mouth daily.   BD PEN NEEDLE NANO 2ND GEN 32G X 4 MM MISC USE 1 PEN THREE TIMES DAILY AS DIRECTED   clopidogrel (PLAVIX) 75 MG tablet Take 1 tablet (75 mg total) by mouth daily. Patient needs appointment for additional refills.   ezetimibe (ZETIA) 10 MG tablet Take 1 tablet (10 mg total) by mouth daily.   glucose blood (ACCU-CHEK AVIVA PLUS) test strip Use to test blood sugar 3 times daily. Dx: E11.42   HYDROcodone-acetaminophen (NORCO) 10-325 MG tablet Take 1 tablet by mouth 3 (three) times daily as needed.   ibuprofen (ADVIL) 600 MG tablet Take 600 mg by mouth every 6 (six) hours as needed.   insulin detemir (LEVEMIR) 100 UNIT/ML FlexPen Inject 20 Units into the skin at bedtime.   insulin lispro (HUMALOG KWIKPEN) 100 UNIT/ML KwikPen Inject 8-10 units into the skin per sliding scale three times daily with meals.   isosorbide mononitrate (IMDUR) 60 MG 24 hr tablet TAKE 1 TABLET(60 MG) BY MOUTH DAILY   magic mouthwash w/lidocaine SOLN Take 2 mLs by mouth 3 (three) times daily as needed for mouth pain.   metoprolol tartrate (LOPRESSOR) 25 MG tablet Take 0.5 tablets (12.5 mg total) by mouth 2 (two) times daily.   nitroGLYCERIN (NITROSTAT) 0.4 MG SL tablet Dissolve  one tablet under tongue as needed for chest pain. (Patient taking differently: Place 0.4 mg under the tongue every 5 (five) minutes as needed for chest pain.)   NOVOLOG FLEXPEN 100 UNIT/ML FlexPen SMARTSIG:8-10 Unit(s) SUB-Q 3 Times Daily   REPATHA 140 MG/ML SOSY Inject 140 mg into the skin every 14 (fourteen) days.   No facility-administered encounter medications on file as of 12/08/2020.    Functional Status:  In your present state of health, do you have any difficulty performing the following activities: 09/21/2020 09/14/2020  Hearing? - -  Vision? - -  Difficulty concentrating or making decisions? - -  Walking or climbing stairs? - -  Dressing or bathing? - -  Doing errands, shopping? - N  Conservation officer, nature and eating ? N -  Using the Toilet? N -  In the past six months, have you accidently leaked urine? N -  Do you have problems with loss of bowel control? N -  Managing your Medications? N -  Managing your Finances? N -  Housekeeping or managing your Housekeeping? Y -  Comment Applying for PCS. -  Some recent data might be hidden    Fall/Depression Screening: Fall Risk  09/21/2020 09/08/2020 08/30/2020  Falls in the past year? 1 0 0  Number falls in past yr: 0 0 0  Injury with Fall? 0 0 0  Risk for fall due to : History of fall(s);Impaired balance/gait;Impaired mobility - -  Follow up Education provided;Falls prevention discussed - -   PHQ 2/9 Scores 09/21/2020  PHQ - 2 Score 0    Assessment:   Care Plan Care Plan : General Plan of Care (Adult)  Updates made by [name], RN since 12/08/2020 12:00 AM     Problem: Health Promotion or Disease Self-Management (General Plan of Care)      Goal: Self-Management Plan Developed Completed 12/08/2020  Start Date: 10/05/2020  Expected End Date: 11/06/2020  Recent Progress: On track  Priority: High  Note:   5/5 - Reviewed biopsychosocial determinants of health screens.   Determined level of modifiable health risk.     Task:  Mutually Develop and Royce Macadamia Achievement of Patient Goals Completed 12/08/2020  Due Date: 11/06/2020  Note:   Care Management Activities:    - barriers to meeting goals identified - decision-making supported - health risks reviewed - problem-solving facilitated    Notes:       Goals Addressed             This Visit's Progress    [facility] - Make and Keep All Appointments   On track    Timeframe:  Short-Term Goal Priority:  Medium Start Date:     5/5                      Expected End Date:  8/5                     Barriers: Knowledge  Follow Up Date 6/2    - call to cancel if needed - keep a calendar with appointment dates    Why is this important?   Part of staying healthy is seeing the doctor for follow-up care.  If you forget your appointments, there are some things you can do to stay on track.    Notes:   5/5 - Verified with daughter that she would be keeping up with all member's appointments and providing transportation.  Appointment for diabetic shoes on 5/12  6/3 - Cardiology follow up on 6/14, follow up for diabetic foot exam on 8/8.  Will also have more teeth removed next week, preparing to be fitted for dentures  7/8 - Has more upcoming appointments for dental work to remove remaining teeth.  Confirms completed appointment with cardiology on 6/14, PCP appointment today.      [facility] - Prevent Falls and Injury   On track    Timeframe:  Long-Range Goal Priority:  High Start Date:           5/5                  Expected End Date:    11/5  Barriers: Health Behaviors Knowledge                          - always use handrails on the stairs - keep my cell phone with me always - learn how to get back up if I fall - use a nonslip pad with throw rugs, or remove them completely - use a nightlight in the bathroom - wear my glasses and/or hearing aid    Why is this important?   Most falls happen when it is hard for you to walk safely. Your balance may be off because of  an illness. You may have pain in your knees, hip or other joints.  You may be overly tired or taking medicines  that make you sleepy. You may not be able to see or hear clearly.  Falls can lead to broken bones, bruises or other injuries.  There are things you can do to help prevent falling.     Notes:   5/5 - Education to prevent falls sent to member/daughter  5/3 - Daughter report he has completed work with PT, has increased in strength  7/8 - Denies recent falls, continues to exercise and build tolerance      [facility] - Track and Manage My Blood Pressure-Hypertension       Timeframe:  Long-Range Goal Priority:  Medium Start Date:    7/8                         Expected End Date:      10/8                 Follow Up Date 01/04/2021    - check blood pressure daily - write blood pressure results in a log or diary    Why is this important?   You won't feel high blood pressure, but it can still hurt your blood vessels.  High blood pressure can cause heart or kidney problems. It can also cause a stroke.  Making lifestyle changes like losing a little weight or eating less salt will help.  Checking your blood pressure at home and at different times of the day can help to control blood pressure.  If the doctor prescribes medicine remember to take it the way the doctor ordered.  Call the office if you cannot afford the medicine or if there are questions about it.     Notes:   7/8 - Per cardiology note, blood pressure was slightly elevated.  Member report he has bought BP monitor, has [facility] calendar book to include log.  Will start daily monitoring         Plan:  Follow-up: Patient agrees to Care Plan and Follow-up. Follow-up in 1 month(s).  [name], [place], MSN Day [phone]

## 2020-12-08 NOTE — Patient Instructions (Signed)
-   increase Metoprolol from 12.5 mg tablet twice daily to 25 mg tablet twice daily for high blood pressure.  - check Blood pressure twice daily and record.please bring blood pressure log in to your visit

## 2020-12-08 NOTE — Progress Notes (Signed)
Provider: Marlowe Sax FNP-C  Veronia Laprise, [name], NP  Patient Care Team: Duanne Duchesne, [name], NP as PCP - General (Family Medicine) [name], RN as [place] Management  Extended Emergency Contact Information Primary Emergency Contact: [place], [place] Mobile Phone: [phone] Relation: Daughter  Code Status:  DNR Goals of care: Advanced Directive information Advanced Directives 12/08/2020  Does Patient Have a Medical Advance Directive? No  Type of Advance Directive -  Does patient want to make changes to medical advance directive? -  Copy of [place] in Chart? -  Would patient like information on creating a medical advance directive? No - Patient declined     Chief Complaint  Patient presents with   Acute Visit    Patient presents for a recheck of blood pressure    HPI:  Pt is a 75 y.o. male seen today for an acute visit for evaluation of blood pressure.He is here with daughter.He states was seen by the cardiologist B/p was not at goal was advised to follow up with PCP since meds were being managed by PCP.He recently bought a blood pressure cuff that has not used it.daughter will be demonstrating to him how to use the B/p machine. B/p today 142/96 states taking medication as directed.Not drinking Beer as he used to. He denies any headache,dizziness,vision changes,fatigue,chest tightness,palpitation,chest pain or shortness of breath.    Weight slightly down by 4 lbs today since last visit.States trying to loss more pounds.   Has lab work ordered by Cardiologist Dr. Terri Skains to be drawn at [facility] but prefers to get lab work done at [facility] here at [facility].    Past Medical History:  Diagnosis Date   Bell palsy    CKD (chronic kidney disease), stage III ([place]) 12/16/2018   Coronary artery disease involving native coronary artery of native heart with unstable angina pectoris ([place]) 12/16/2018   GERD (gastroesophageal reflux disease)     Hypertension    Hypertension with heart disease 12/16/2018   Myocardial infarction ([place])    Pure hypercholesterolemia 12/16/2018   Stroke ([place])    Type 2 diabetes, controlled, with peripheral neuropathy ([place]) 12/16/2018   Past Surgical History:  Procedure Laterality Date   CARDIAC SURGERY     COLONOSCOPY WITH PROPOFOL N/A 01/02/2018   Procedure: COLONOSCOPY WITH PROPOFOL;  Surgeon: Carol Ada, MD;  Location: [facility];  Service: Endoscopy;  Laterality: N/A;   CORONARY STENT INTERVENTION N/A 01/26/2019   Procedure: CORONARY STENT INTERVENTION;  Surgeon: Adrian Prows, MD;  Location: [place] [facility];  Service: Cardiovascular;  Laterality: N/A;   LEFT HEART CATH AND CORS/GRAFTS ANGIOGRAPHY N/A 01/05/2019   Procedure: LEFT HEART CATH AND CORS/GRAFTS ANGIOGRAPHY;  Surgeon: Adrian Prows, MD;  Location: [place] [facility];  Service: Cardiovascular;  Laterality: N/A;   POLYPECTOMY  01/02/2018   Procedure: POLYPECTOMY;  Surgeon: Carol Ada, MD;  Location: [facility];  Service: Endoscopy;;   stents      No Known Allergies  Outpatient Encounter Medications as of 12/08/2020  Medication Sig   amLODipine (NORVASC) 10 MG tablet Take 1 tablet (10 mg total) by mouth daily.   amoxicillin (AMOXIL) 500 MG tablet Take 500 mg by mouth 3 (three) times daily.   atorvastatin (LIPITOR) 20 MG tablet Take 1 tablet (20 mg total) by mouth daily.   BD PEN NEEDLE NANO 2ND GEN 32G X 4 MM MISC USE 1 PEN THREE TIMES DAILY AS DIRECTED   clopidogrel (PLAVIX) 75 MG tablet Take 1  tablet (75 mg total) by mouth daily. Patient needs appointment for additional refills.   ezetimibe (ZETIA) 10 MG tablet Take 1 tablet (10 mg total) by mouth daily.   glucose blood (ACCU-CHEK AVIVA PLUS) test strip Use to test blood sugar 3 times daily. Dx: E11.42   HYDROcodone-acetaminophen (NORCO) 10-325 MG tablet Take 1 tablet by mouth 3 (three) times daily as needed.   ibuprofen (ADVIL) 600 MG tablet Take 600 mg by mouth every 6 (six) hours as  needed.   insulin detemir (LEVEMIR) 100 UNIT/ML FlexPen Inject 20 Units into the skin at bedtime.   insulin lispro (HUMALOG KWIKPEN) 100 UNIT/ML KwikPen Inject 8-10 units into the skin per sliding scale three times daily with meals.   isosorbide mononitrate (IMDUR) 60 MG 24 hr tablet TAKE 1 TABLET(60 MG) BY MOUTH DAILY   magic mouthwash w/lidocaine SOLN Take 2 mLs by mouth 3 (three) times daily as needed for mouth pain.   metoprolol tartrate (LOPRESSOR) 25 MG tablet Take 0.5 tablets (12.5 mg total) by mouth 2 (two) times daily.   nitroGLYCERIN (NITROSTAT) 0.4 MG SL tablet Dissolve one tablet under tongue as needed for chest pain. (Patient taking differently: Place 0.4 mg under the tongue every 5 (five) minutes as needed for chest pain.)   NOVOLOG FLEXPEN 100 UNIT/ML FlexPen SMARTSIG:8-10 Unit(s) SUB-Q 3 Times Daily   REPATHA 140 MG/ML SOSY Inject 140 mg into the skin every 14 (fourteen) days.   No facility-administered encounter medications on file as of 12/08/2020.    Review of Systems  Constitutional:  Negative for appetite change, chills, fatigue, fever and unexpected weight change.  HENT:  Negative for congestion, dental problem, ear discharge, ear pain, facial swelling, hearing loss, nosebleeds, postnasal drip, rhinorrhea, sinus pressure, sinus pain, sneezing, sore throat, tinnitus and trouble swallowing.   Eyes:  Negative for pain, discharge, redness, itching and visual disturbance.  Respiratory:  Negative for cough, chest tightness, shortness of breath and wheezing.   Cardiovascular:  Negative for chest pain, palpitations and leg swelling.  Gastrointestinal:  Negative for abdominal distention, abdominal pain, blood in stool, constipation, diarrhea, nausea and vomiting.  Genitourinary:  Negative for difficulty urinating, dysuria, flank pain, frequency and urgency.  Musculoskeletal:  Positive for arthralgias and back pain. Negative for gait problem, joint swelling, myalgias, neck pain and  neck stiffness.  Skin:  Negative for color change, pallor, rash and wound.  Neurological:  Negative for dizziness, syncope, speech difficulty, weakness, light-headedness, numbness and headaches.  Psychiatric/Behavioral:  Negative for agitation, behavioral problems, confusion and sleep disturbance. The patient is not nervous/anxious.    Immunization History  Administered Date(s) Administered   PFIZER(Purple Top)SARS-COV-2 Vaccination 08/13/2019, 09/06/2019   Pneumococcal Conjugate-13 01/14/2020   Pertinent  Health Maintenance Due  Topic Date Due   OPHTHALMOLOGY EXAM  Never done   URINE MICROALBUMIN  Never done   INFLUENZA VACCINE  01/01/2021   PNA vac Low Risk Adult (2 of 2 - PPSV23) 01/13/2021   HEMOGLOBIN A1C  03/08/2021   FOOT EXAM  10/06/2021   COLONOSCOPY (Pts 45-8yrs Insurance coverage will need to be confirmed)  01/03/2028   Fall Risk  12/08/2020 09/21/2020 09/08/2020 08/30/2020 06/26/2020  Falls in the past year? 0 1 0 0 0  Number falls in past yr: 0 0 0 0 0  Injury with Fall? 0 0 0 0 0  Risk for fall due to : No Fall Risks History of fall(s);Impaired balance/gait;Impaired mobility - - -  Follow up Falls evaluation completed Education provided;Falls prevention discussed - - -  Functional Status Survey:    Vitals:   12/08/20 1511  BP: (!) 142/96  Pulse: 77  Resp: 20  Temp: (!) 97.3 F (36.3 C)  SpO2: 98%  Weight: 259 lb 12.8 oz (117.8 kg)  Height: 5\' 7"  (1.702 m)   Body mass index is 40.69 kg/m. Physical Exam Vitals reviewed.  Constitutional:      General: He is not in acute distress.    Appearance: Normal appearance. He is obese. He is not ill-appearing or diaphoretic.  HENT:     Head: Normocephalic.     Nose: Nose normal. No congestion or rhinorrhea.     Mouth/Throat:     Mouth: Mucous membranes are moist.     Pharynx: Oropharynx is clear. No oropharyngeal exudate or posterior oropharyngeal erythema.  Eyes:     General: No scleral icterus.       Right eye:  No discharge.        Left eye: No discharge.     Extraocular Movements: Extraocular movements intact.     Conjunctiva/sclera: Conjunctivae normal.     Pupils: Pupils are equal, round, and reactive to light.  Neck:     Vascular: No carotid bruit.  Cardiovascular:     Rate and Rhythm: Normal rate and regular rhythm.     Pulses: Normal pulses.     Heart sounds: Normal heart sounds. No murmur heard.   No friction rub. No gallop.  Pulmonary:     Effort: Pulmonary effort is normal. No respiratory distress.     Breath sounds: Normal breath sounds. No wheezing, rhonchi or rales.  Chest:     Chest wall: No tenderness.  Abdominal:     General: Bowel sounds are normal. There is no distension.     Palpations: Abdomen is soft. There is no mass.     Tenderness: There is no abdominal tenderness. There is no right CVA tenderness, left CVA tenderness, guarding or rebound.  Musculoskeletal:        General: No swelling or tenderness. Normal range of motion.     Cervical back: Normal range of motion. No rigidity or tenderness.     Right lower leg: No edema.     Left lower leg: No edema.  Lymphadenopathy:     Cervical: No cervical adenopathy.  Skin:    General: Skin is warm and dry.     Coloration: Skin is not pale.     Findings: No bruising, erythema, lesion or rash.  Neurological:     Mental Status: He is alert and oriented to person, place, and time.     Cranial Nerves: No cranial nerve deficit.     Sensory: No sensory deficit.     Motor: No weakness.     Coordination: Coordination normal.     Gait: Gait normal.  Psychiatric:        Mood and Affect: Mood normal.        Speech: Speech normal.        Behavior: Behavior normal.        Thought Content: Thought content normal.        Judgment: Judgment normal.    Labs reviewed: Recent Labs    09/13/20 0717 09/13/20 1630 09/14/20 0414 09/15/20 0147  NA 138  --  140 138  K 2.8* 3.7 3.4* 3.7  CL 111  --  114* 113*  CO2 18*  --  18* 17*   GLUCOSE 169*  --  176* 175*  BUN 56*  --  38*  24*  CREATININE 1.81*  --  1.62* 1.43*  CALCIUM 8.8*  --  8.9 9.1  MG 2.1 2.2 1.9 1.9  PHOS  --   --  1.6* 2.4*   Recent Labs    09/11/20 1019 09/14/20 0414 09/15/20 0147  AST 17 19 25   ALT 16 21 30   ALKPHOS 75 53 58  BILITOT 0.9 0.6 0.7  PROT 8.0 6.4* 7.0  ALBUMIN 3.8 2.9* 3.1*   Recent Labs    09/11/20 1019 09/11/20 1355 09/12/20 0203 09/14/20 0414 09/15/20 0147  WBC 10.5  --  8.3 8.9 10.5  NEUTROABS 6.6  --   --  5.3 6.0  HGB 16.5   < > 13.0 12.0* 12.4*  HCT 46.9   < > 35.3* 33.5* 35.0*  MCV 87.8  --  85.3 88.4 88.2  PLT 178  --  155 156 178   < > = values in this interval not displayed.   Lab Results  Component Value Date   TSH 1.63 09/06/2020   Lab Results  Component Value Date   HGBA1C 8.1 (H) 09/06/2020   Lab Results  Component Value Date   CHOL 143 09/14/2020   HDL 25 (L) 09/14/2020   LDLCALC 91 09/14/2020   TRIG 134 09/14/2020   CHOLHDL 5.7 09/14/2020    Significant Diagnostic Results in last 30 days:  No results found.  Assessment/Plan 1. Benign hypertension with chronic kidney disease, stage III (HCC) B/p not at goal. Continue on amlodipine  and Imdur. Advised to increase Metoprolol from 12.5 mg tablet twice daily to 25 mg tablet twice daily. Advised to check B/p and HR at home and record on log provided today. - follow up in 2 weeks to recheck B/p. Daughter request to follow up in 3 weeks instead.  - metoprolol tartrate (LOPRESSOR) 25 MG tablet; Take 1 tablet (25 mg total) by mouth 2 (two) times daily.  Dispense: 180 tablet; Refill: 0  2. DNR (do not resuscitate) Reviewed on chart  - Do not attempt resuscitation (DNR)  Family/ staff Communication: Reviewed plan of care with patient and daughter verbalized understanding.  Labs/tests ordered: Has lab work ordered by Cardiologist that he would like to be done here at [facility] instead of [facility]   Next Appointment: 3 weeks for follow up B/P    Sandrea Hughs, NP

## 2020-12-13 ENCOUNTER — Other Ambulatory Visit: Payer: Medicare Other

## 2020-12-13 ENCOUNTER — Other Ambulatory Visit: Payer: Self-pay

## 2020-12-13 DIAGNOSIS — E1142 Type 2 diabetes mellitus with diabetic polyneuropathy: Secondary | ICD-10-CM

## 2020-12-13 DIAGNOSIS — I129 Hypertensive chronic kidney disease with stage 1 through stage 4 chronic kidney disease, or unspecified chronic kidney disease: Secondary | ICD-10-CM

## 2020-12-13 DIAGNOSIS — E782 Mixed hyperlipidemia: Secondary | ICD-10-CM | POA: Diagnosis not present

## 2020-12-13 DIAGNOSIS — N183 Chronic kidney disease, stage 3 unspecified: Secondary | ICD-10-CM

## 2020-12-14 LAB — COMPLETE METABOLIC PANEL WITH GFR
AG Ratio: 1.3 (calc) (ref 1.0–2.5)
ALT: 45 U/L (ref 9–46)
AST: 29 U/L (ref 10–35)
Albumin: 3.9 g/dL (ref 3.6–5.1)
Alkaline phosphatase (APISO): 80 U/L (ref 35–144)
BUN/Creatinine Ratio: 16 (calc) (ref 6–22)
BUN: 29 mg/dL — ABNORMAL HIGH (ref 7–25)
CO2: 21 mmol/L (ref 20–32)
Calcium: 9.5 mg/dL (ref 8.6–10.3)
Chloride: 110 mmol/L (ref 98–110)
Creat: 1.77 mg/dL — ABNORMAL HIGH (ref 0.70–1.28)
Globulin: 2.9 g/dL (calc) (ref 1.9–3.7)
Glucose, Bld: 120 mg/dL — ABNORMAL HIGH (ref 65–99)
Potassium: 4.7 mmol/L (ref 3.5–5.3)
Sodium: 140 mmol/L (ref 135–146)
Total Bilirubin: 0.5 mg/dL (ref 0.2–1.2)
Total Protein: 6.8 g/dL (ref 6.1–8.1)
eGFR: 40 mL/min/{1.73_m2} — ABNORMAL LOW (ref >=60)

## 2020-12-14 LAB — CBC WITH DIFFERENTIAL/PLATELET
Absolute Monocytes: 601 cells/uL (ref 200–950)
Basophils Absolute: 39 cells/uL (ref 0–200)
Basophils Relative: 0.5 %
Eosinophils Absolute: 117 cells/uL (ref 15–500)
Eosinophils Relative: 1.5 %
HCT: 41.3 % (ref 38.5–50.0)
Hemoglobin: 13.6 g/dL (ref 13.2–17.1)
Lymphs Abs: 2426 cells/uL (ref 850–3900)
MCH: 30.1 pg (ref 27.0–33.0)
MCHC: 32.9 g/dL (ref 32.0–36.0)
MCV: 91.4 fL (ref 80.0–100.0)
MPV: 11 fL (ref 7.5–12.5)
Monocytes Relative: 7.7 %
Neutro Abs: 4618 cells/uL (ref 1500–7800)
Neutrophils Relative %: 59.2 %
Platelets: 141 10*3/uL (ref 140–400)
RBC: 4.52 10*6/uL (ref 4.20–5.80)
RDW: 13.4 % (ref 11.0–15.0)
Total Lymphocyte: 31.1 %
WBC: 7.8 10*3/uL (ref 3.8–10.8)

## 2020-12-14 LAB — HEMOGLOBIN A1C
Hgb A1c MFr Bld: 8.5 % of total Hgb — ABNORMAL HIGH (ref <5.7)
Mean Plasma Glucose: 197 mg/dL
eAG (mmol/L): 10.9 mmol/L

## 2020-12-14 LAB — LIPID PANEL
Cholesterol: 92 mg/dL (ref <200)
HDL: 43 mg/dL (ref >=40)
LDL Cholesterol (Calc): 32 mg/dL (calc)
Non-HDL Cholesterol (Calc): 49 mg/dL (calc) (ref <130)
Total CHOL/HDL Ratio: 2.1 (calc) (ref <5.0)
Triglycerides: 90 mg/dL (ref <150)

## 2020-12-14 LAB — TSH: TSH: 4.89 mIU/L — ABNORMAL HIGH (ref 0.40–4.50)

## 2020-12-15 ENCOUNTER — Other Ambulatory Visit: Payer: Self-pay | Admitting: Cardiology

## 2020-12-18 ENCOUNTER — Other Ambulatory Visit: Payer: Self-pay

## 2020-12-18 DIAGNOSIS — E039 Hypothyroidism, unspecified: Secondary | ICD-10-CM

## 2020-12-19 ENCOUNTER — Telehealth: Payer: Self-pay | Admitting: *Deleted

## 2020-12-19 DIAGNOSIS — E1142 Type 2 diabetes mellitus with diabetic polyneuropathy: Secondary | ICD-10-CM

## 2020-12-19 MED ORDER — INSULIN DETEMIR 100 UNIT/ML FLEXPEN: 22.0000 [IU] | PEN_INJECTOR | Freq: Every day | SUBCUTANEOUS | 1 refills | Status: DC

## 2020-12-19 NOTE — Telephone Encounter (Signed)
Patient notified and agreed. Medication list updated.  

## 2020-12-19 NOTE — Telephone Encounter (Signed)
-----   Message from Sandrea Hughs, NP sent at 12/18/2020  9:34 AM EDT ----- - Hgb A1C slightly high increase levemir from 20 units to 22 units at bedtime. - thyroid level slightly high will check T3 and T4 level on your upcoming visit to rule out hypothyroidism. - kidney function are high avoid Ibuprofen. - cholesterol is well controlled. Will forward results to [place].

## 2020-12-27 ENCOUNTER — Encounter: Payer: Self-pay | Admitting: Family

## 2020-12-27 ENCOUNTER — Other Ambulatory Visit: Payer: Self-pay | Admitting: Family

## 2020-12-27 ENCOUNTER — Other Ambulatory Visit: Payer: Self-pay

## 2020-12-27 ENCOUNTER — Ambulatory Visit (INDEPENDENT_AMBULATORY_CARE_PROVIDER_SITE_OTHER): Payer: Medicare Other | Admitting: Podiatry

## 2020-12-27 DIAGNOSIS — M2012 Hallux valgus (acquired), left foot: Secondary | ICD-10-CM

## 2020-12-27 DIAGNOSIS — M2142 Flat foot [pes planus] (acquired), left foot: Secondary | ICD-10-CM

## 2020-12-27 DIAGNOSIS — E1142 Type 2 diabetes mellitus with diabetic polyneuropathy: Secondary | ICD-10-CM

## 2020-12-27 DIAGNOSIS — M2011 Hallux valgus (acquired), right foot: Secondary | ICD-10-CM

## 2020-12-27 DIAGNOSIS — M2141 Flat foot [pes planus] (acquired), right foot: Secondary | ICD-10-CM

## 2020-12-27 NOTE — Progress Notes (Signed)
This encounter was created in error - please disregard.

## 2020-12-27 NOTE — Progress Notes (Signed)
Patient presented to the office today to pick up diabetic shoes and 3 pair diabetic custom inserts.  Patient stated that the shoes that he saw was not the shoes patient had picked out.  Patient wanted another pair and the shoes were [id number] size [id number] 1/2   Patient will be contacted when the shoes are ready to be picked up

## 2020-12-28 ENCOUNTER — Encounter: Payer: Medicare Other | Admitting: Family

## 2021-01-04 ENCOUNTER — Other Ambulatory Visit: Payer: Self-pay | Admitting: *Deleted

## 2021-01-04 NOTE — Patient Outreach (Signed)
[facility]) Care Management  01/04/2021  David Burton Feb 01, 1946 [id number]   Outgoing call placed to member, no answer, unable to leave voice message.  Call placed to daughter, state she does not have time to talk as she is working but state member is "great."  Denies any urgent concerns, encouraged to contact this care Freight forwarder with questions.  Agrees to follow up within the next month.   Goals Addressed             This Visit's Progress    [facility] - Make and Keep All Appointments   On track    Timeframe:  Short-Term Goal Priority:  Medium Start Date:     5/5                      Expected End Date:  8/5                     Barriers: Knowledge  Follow Up Date 6/2    - call to cancel if needed - keep a calendar with appointment dates    Why is this important?   Part of staying healthy is seeing the doctor for follow-up care.  If you forget your appointments, there are some things you can do to stay on track.    Notes:   5/5 - Verified with daughter that she would be keeping up with all member's appointments and providing transportation.  Appointment for diabetic shoes on 5/12  6/3 - Cardiology follow up on 6/14, follow up for diabetic foot exam on 8/8.  Will also have more teeth removed next week, preparing to be fitted for dentures  7/8 - Has more upcoming appointments for dental work to remove remaining teeth.  Confirms completed appointment with cardiology on 6/14, PCP appointment today.  8/4 - Daughter report member has follow up with PCP tomorrow to review medication changes for insulin as well as HTN     THN - Prevent Falls and Injury   On track    Timeframe:  Long-Range Goal Priority:  High Start Date:           5/5                  Expected End Date:    11/5  Barriers: Health Behaviors Knowledge                          - always use handrails on the stairs - keep my cell phone with me always - learn how to get back up if I fall - use a  nonslip pad with throw rugs, or remove them completely - use a nightlight in the bathroom - wear my glasses and/or hearing aid    Why is this important?   Most falls happen when it is hard for you to walk safely. Your balance may be off because of an illness. You may have pain in your knees, hip or other joints.  You may be overly tired or taking medicines that make you sleepy. You may not be able to see or hear clearly.  Falls can lead to broken bones, bruises or other injuries.  There are things you can do to help prevent falling.     Notes:   5/5 - Education to prevent falls sent to member/daughter  5/3 - Daughter report he has completed work with PT, has increased in strength  7/8 - Denies  recent falls, continues to exercise and build tolerance  8/4 - Discussed with daughter interventions to prevent falls.  Encouraged to continue with daily exercises to promote strength and endurance     THN - Track and Manage My Blood Pressure-Hypertension   On track    Timeframe:  Long-Range Goal Priority:  Medium Start Date:    7/8                         Expected End Date:      10/8                 Barriers: Health Behaviors    - check blood pressure daily - write blood pressure results in a log or diary    Why is this important?   You won't feel high blood pressure, but it can still hurt your blood vessels.  High blood pressure can cause heart or kidney problems. It can also cause a stroke.  Making lifestyle changes like losing a little weight or eating less salt will help.  Checking your blood pressure at home and at different times of the day can help to control blood pressure.  If the doctor prescribes medicine remember to take it the way the doctor ordered.  Call the office if you cannot afford the medicine or if there are questions about it.     Notes:   7/8 - Per cardiology note, blood pressure was slightly elevated.  Member report he has bought BP monitor, has [facility]N calendar  book to include log.  Will start daily monitoring  8/4 - Daughter report member has been monitoring blood pressure and glucose daily, does not have time to provide specific numbers as she is working        [name], Therapist, sports, MSN [place] [phone]

## 2021-01-05 ENCOUNTER — Other Ambulatory Visit: Payer: Self-pay

## 2021-01-05 ENCOUNTER — Ambulatory Visit (INDEPENDENT_AMBULATORY_CARE_PROVIDER_SITE_OTHER): Payer: Medicare Other | Admitting: Family

## 2021-01-05 ENCOUNTER — Encounter: Payer: Self-pay | Admitting: Family

## 2021-01-05 VITALS — BP 144/82 | HR 70 | Temp 97.3°F | Resp 18 | Ht 67.0 in | Wt 256.6 lb

## 2021-01-05 DIAGNOSIS — N183 Chronic kidney disease, stage 3 unspecified: Secondary | ICD-10-CM | POA: Diagnosis not present

## 2021-01-05 DIAGNOSIS — I129 Hypertensive chronic kidney disease with stage 1 through stage 4 chronic kidney disease, or unspecified chronic kidney disease: Secondary | ICD-10-CM | POA: Diagnosis not present

## 2021-01-05 DIAGNOSIS — I739 Peripheral vascular disease, unspecified: Secondary | ICD-10-CM

## 2021-01-05 DIAGNOSIS — R200 Anesthesia of skin: Secondary | ICD-10-CM

## 2021-01-05 DIAGNOSIS — G894 Chronic pain syndrome: Secondary | ICD-10-CM

## 2021-01-05 DIAGNOSIS — R202 Paresthesia of skin: Secondary | ICD-10-CM | POA: Diagnosis not present

## 2021-01-05 MED ORDER — HYDROCODONE-ACETAMINOPHEN 10-325 MG PO TABS: 1.0000 | ORAL_TABLET | Freq: Three times a day (TID) | ORAL | 0 refills | Status: DC | PRN

## 2021-01-05 NOTE — Patient Instructions (Signed)
-   continue current medication  - referral ordered for vein and vascular specialist to evaluate leg pain and Numbness on the feet

## 2021-01-05 NOTE — Progress Notes (Signed)
Provider: Marlowe Sax FNP-C  Shonita Rinck, [name], NP  Patient Care Team: Maleeah Crossman, [name], NP as PCP - General (Family Medicine) [name], RN as [place] Management  Extended Emergency Contact Information Primary Emergency Contact: Theodor, Mustin Mobile Phone: [phone] Relation: Daughter  Code Status:  DNR Goals of care: Advanced Directive information Advanced Directives 01/05/2021  Does Patient Have a Medical Advance Directive? Yes  Type of Advance Directive Out of facility DNR (pink MOST or yellow form)  Does patient want to make changes to medical advance directive? No - Patient declined  Copy of [place] in Chart? -  Would patient like information on creating a medical advance directive? -     Chief Complaint  Patient presents with   Follow-up    3 week follow up on Blood Pressure.    HPI:  Pt is a 75 y.o. male seen today for an acute visit for follow up high blood pressure.He was here 12/08/2020 b/p was 142/96 was advised to increase Metoprolol 12.5 mg tablet bid to 25 mg tablet Bid and follow up today. B/p at home checked by Nurse 132/72. denies any headache,dizziness,vision changes,fatigue,chest tightness,palpitation,chest pain or shortness of breath.     States having numbness on the bottom of the foot all the time. Has had pain on the legs does not worsen with exercise. Pain worst on the left.denies any swelling on the legs. Brought in Health screen form completed by [employer] Nurse practitioner Rae Halsted, NP done 01/02/2021 circulation screening ([employer]) on right leg was mild/moderate 0.89 - 0-3.0 and left leg was Normal / Borderline 1.4 - 0.90  patient is very concerns does not want to end up with leg amputation states need immediate help.wants refer to specialist to evaluate circulation.  Also request Hydrocodone refill    Past Medical History:  Diagnosis Date   Bell palsy    CKD (chronic kidney  disease), stage III ([place]) 12/16/2018   Coronary artery disease involving native coronary artery of native heart with unstable angina pectoris ([place]) 12/16/2018   GERD (gastroesophageal reflux disease)    Hypertension    Hypertension with heart disease 12/16/2018   Myocardial infarction ([place])    Pure hypercholesterolemia 12/16/2018   Stroke ([place])    Type 2 diabetes, controlled, with peripheral neuropathy ([place]) 12/16/2018   Past Surgical History:  Procedure Laterality Date   CARDIAC SURGERY     COLONOSCOPY WITH PROPOFOL N/A 01/02/2018   Procedure: COLONOSCOPY WITH PROPOFOL;  Surgeon: Carol Ada, MD;  Location: [facility] END[facility];  Service: Endoscopy;  Laterality: N/A;   CORONARY STENT INTERVENTION N/A 01/26/2019   Procedure: CORONARY STENT INTERVENTION;  Surgeon: Adrian Prows, MD;  Location: [place] [facility];  Service: Cardiovascular;  Laterality: N/A;   LEFT HEART CATH AND CORS/GRAFTS ANGIOGRAPHY N/A 01/05/2019   Procedure: LEFT HEART CATH AND CORS/GRAFTS ANGIOGRAPHY;  Surgeon: Adrian Prows, MD;  Location: [place] CV [facility];  Service: Cardiovascular;  Laterality: N/A;   POLYPECTOMY  01/02/2018   Procedure: POLYPECTOMY;  Surgeon: Carol Ada, MD;  Location: [facility] END[facility];  Service: Endoscopy;;   stents      No Known Allergies  Outpatient Encounter Medications as of 01/05/2021  Medication Sig   amLODipine (NORVASC) 10 MG tablet Take 1 tablet (10 mg total) by mouth daily.   atorvastatin (LIPITOR) 20 MG tablet TAKE 1 TABLET(20 MG) BY MOUTH DAILY   BD PEN NEEDLE NANO 2ND GEN 32G X 4 MM MISC USE 1 PEN THREE TIMES DAILY  AS DIRECTED   clopidogrel (PLAVIX) 75 MG tablet Take 1 tablet (75 mg total) by mouth daily. Patient needs appointment for additional refills.   ezetimibe (ZETIA) 10 MG tablet Take 1 tablet (10 mg total) by mouth daily.   glucose blood (ACCU-CHEK AVIVA PLUS) test strip Use to test blood sugar 3 times daily. Dx: E11.42   HYDROcodone-acetaminophen (NORCO) 10-325 MG tablet Take 1 tablet by mouth  3 (three) times daily as needed.   ibuprofen (ADVIL) 600 MG tablet Take 600 mg by mouth every 6 (six) hours as needed.   insulin detemir (LEVEMIR) 100 UNIT/ML FlexPen Inject 22 Units into the skin at bedtime.   insulin lispro (HUMALOG KWIKPEN) 100 UNIT/ML KwikPen Inject 8-10 units into the skin per sliding scale three times daily with meals.   isosorbide mononitrate (IMDUR) 60 MG 24 hr tablet TAKE 1 TABLET(60 MG) BY MOUTH DAILY   magic mouthwash w/lidocaine SOLN Take 2 mLs by mouth 3 (three) times daily as needed for mouth pain.   metoprolol tartrate (LOPRESSOR) 25 MG tablet Take 1 tablet (25 mg total) by mouth 2 (two) times daily.   nitroGLYCERIN (NITROSTAT) 0.4 MG SL tablet Dissolve one tablet under tongue as needed for chest pain.   NOVOLOG FLEXPEN 100 UNIT/ML FlexPen SMARTSIG:8-10 Unit(s) SUB-Q 3 Times Daily   REPATHA 140 MG/ML SOSY Inject 140 mg into the skin every 14 (fourteen) days.   [DISCONTINUED] amoxicillin (AMOXIL) 500 MG tablet Take 500 mg by mouth 3 (three) times daily.   No facility-administered encounter medications on file as of 01/05/2021.    Review of Systems  Constitutional:  Negative for appetite change, chills, fatigue, fever and unexpected weight change.  HENT:  Negative for congestion, dental problem, ear discharge, ear pain, facial swelling, hearing loss, nosebleeds, postnasal drip, rhinorrhea, sinus pressure, sinus pain, sneezing, sore throat, tinnitus and trouble swallowing.   Eyes:  Negative for pain, discharge, redness, itching and visual disturbance.  Respiratory:  Negative for cough, chest tightness, shortness of breath and wheezing.   Cardiovascular:  Negative for chest pain, palpitations and leg swelling.  Gastrointestinal:  Negative for abdominal distention, abdominal pain, blood in stool, constipation, diarrhea, nausea and vomiting.  Genitourinary:  Negative for difficulty urinating, dysuria, flank pain, frequency and urgency.  Musculoskeletal:  Positive for  arthralgias and back pain. Negative for gait problem, joint swelling, myalgias, neck pain and neck stiffness.  Skin:  Negative for color change, pallor, rash and wound.  Neurological:  Positive for numbness. Negative for dizziness, syncope, speech difficulty, weakness, light-headedness and headaches.       Numbness on bottom of feet   Hematological:  Does not bruise/bleed easily.  Psychiatric/Behavioral:  Negative for agitation, behavioral problems, confusion, hallucinations and sleep disturbance. The patient is not nervous/anxious.        Memory lapse   Immunization History  Administered Date(s) Administered   PFIZER(Purple Top)SARS-COV-2 Vaccination 08/13/2019, 09/06/2019   Pneumococcal Conjugate-13 01/14/2020   Pertinent  Health Maintenance Due  Topic Date Due   OPHTHALMOLOGY EXAM  Never done   URINE MICROALBUMIN  Never done   INFLUENZA VACCINE  01/01/2021   PNA vac Low Risk Adult (2 of 2 - PPSV23) 01/13/2021   HEMOGLOBIN A1C  06/15/2021   FOOT EXAM  10/06/2021   COLONOSCOPY (Pts 45-49yrs Insurance coverage will need to be confirmed)  01/03/2028   Fall Risk  01/05/2021 12/27/2020 12/08/2020 09/21/2020 09/08/2020  Falls in the past year? 0 0 0 1 0  Number falls in past yr: 0 0  0 0 0  Injury with Fall? 0 0 0 0 0  Risk for fall due to : No Fall Risks No Fall Risks No Fall Risks History of fall(s);Impaired balance/gait;Impaired mobility -  Follow up Falls evaluation completed Falls evaluation completed Falls evaluation completed Education provided;Falls prevention discussed -   Functional Status Survey:    Vitals:   01/05/21 1328  BP: (!) 144/82  Pulse: 70  Resp: 18  Temp: (!) 97.3 F (36.3 C)  SpO2: 96%  Weight: 256 lb 9.6 oz (116.4 kg)  Height: 5\' 7"  (1.702 m)   Body mass index is 40.19 kg/m. Physical Exam Vitals reviewed.  Constitutional:      General: He is not in acute distress.    Appearance: Normal appearance. He is morbidly obese. He is not ill-appearing or  diaphoretic.  HENT:     Head: Normocephalic.     Mouth/Throat:     Mouth: Mucous membranes are moist.     Pharynx: Oropharynx is clear. No oropharyngeal exudate or posterior oropharyngeal erythema.  Eyes:     General: No scleral icterus.       Right eye: No discharge.        Left eye: No discharge.     Extraocular Movements: Extraocular movements intact.     Conjunctiva/sclera: Conjunctivae normal.     Pupils: Pupils are equal, round, and reactive to light.  Neck:     Vascular: No carotid bruit.  Cardiovascular:     Rate and Rhythm: Normal rate and regular rhythm.     Pulses: Normal pulses.     Heart sounds: Normal heart sounds. No murmur heard.   No friction rub. No gallop.  Pulmonary:     Effort: Pulmonary effort is normal. No respiratory distress.     Breath sounds: Normal breath sounds. No wheezing, rhonchi or rales.  Chest:     Chest wall: No tenderness.  Abdominal:     General: Bowel sounds are normal. There is no distension.     Palpations: Abdomen is soft. There is no mass.     Tenderness: There is no abdominal tenderness. There is no right CVA tenderness, left CVA tenderness, guarding or rebound.  Musculoskeletal:        General: No swelling or tenderness. Normal range of motion.     Cervical back: Normal range of motion. No rigidity or tenderness.     Right lower leg: No edema.     Left lower leg: No edema.  Lymphadenopathy:     Cervical: No cervical adenopathy.  Skin:    General: Skin is warm and dry.     Coloration: Skin is not pale.     Findings: No bruising, erythema, lesion or rash.  Neurological:     Mental Status: He is alert. Mental status is at baseline.     Cranial Nerves: No cranial nerve deficit.     Sensory: No sensory deficit.     Motor: No weakness.     Coordination: Coordination normal.     Gait: Gait normal.  Psychiatric:        Mood and Affect: Mood normal.        Speech: Speech normal.        Behavior: Behavior normal.        Thought  Content: Thought content normal.        Cognition and Memory: Memory is impaired.        Judgment: Judgment normal.    Labs reviewed: Recent Labs  09/13/20 1630 09/14/20 0414 09/15/20 0147 12/13/20 1328  NA  --  140 138 140  K 3.7 3.4* 3.7 4.7  CL  --  114* 113* 110  CO2  --  18* 17* 21  GLUCOSE  --  176* 175* 120*  BUN  --  38* 24* 29*  CREATININE  --  1.62* 1.43* 1.77*  CALCIUM  --  8.9 9.1 9.5  MG 2.2 1.9 1.9  --   PHOS  --  1.6* 2.4*  --    Recent Labs    09/11/20 1019 09/14/20 0414 09/15/20 0147 12/13/20 1328  AST 17 19 25 29   ALT 16 21 30  45  ALKPHOS 75 53 58  --   BILITOT 0.9 0.6 0.7 0.5  PROT 8.0 6.4* 7.0 6.8  ALBUMIN 3.8 2.9* 3.1*  --    Recent Labs    09/14/20 0414 09/15/20 0147 12/13/20 1328  WBC 8.9 10.5 7.8  NEUTROABS 5.3 6.0 4,618  HGB 12.0* 12.4* 13.6  HCT 33.5* 35.0* 41.3  MCV 88.4 88.2 91.4  PLT 156 178 141   Lab Results  Component Value Date   TSH 4.89 (H) 12/13/2020   Lab Results  Component Value Date   HGBA1C 8.5 (H) 12/13/2020   Lab Results  Component Value Date   CHOL 92 12/13/2020   HDL 43 12/13/2020   LDLCALC 32 12/13/2020   TRIG 90 12/13/2020   CHOLHDL 2.1 12/13/2020    Significant Diagnostic Results in last 30 days:  No results found.  Assessment/Plan 1. Benign hypertension with chronic kidney disease, stage III (HCC) B/p not at goal but at his baseline.  Continue on Amlodipine,Imdur and metoprolol   2. Numbness and tingling of both lower extremities Worsening numbness at the both feet - made aware that numbness is related to Type 2 DM but thinks it's due to recent abnormal right leg circulation screening done at home by [facility] Nurse Practitioner Christella Noa showed on right leg was mild/moderate 0.89 - 0-3.0 and left leg was Normal / Borderline 1.4 - 0.90   - Ambulatory referral to Vascular Surgery  3. Peripheral vascular disease ([place]) No Hx of smoking.declined repeat ABI's.  ABI done as above  right leg was mild/moderate 0.89 - 0-3.0 and left leg was Normal / Borderline 1.4 - 0.90   - Ambulatory referral to Vascular Surgery  4. Chronic pain syndrome Chronic on lower back.PDMP reviewed  - HYDROcodone-acetaminophen (NORCO) 10-325 MG tablet; Take 1 tablet by mouth 3 (three) times daily as needed.  Dispense: 90 tablet; Refill: 0   Family/ staff Communication: Reviewed plan of care with patient  Labs/tests ordered: None   Next Appointment: As needed if symptoms worsen or fail to improve    Sandrea Hughs, NP

## 2021-01-08 ENCOUNTER — Ambulatory Visit (INDEPENDENT_AMBULATORY_CARE_PROVIDER_SITE_OTHER): Payer: Medicare Other | Admitting: Podiatry

## 2021-01-08 ENCOUNTER — Other Ambulatory Visit: Payer: Self-pay

## 2021-01-08 DIAGNOSIS — M79675 Pain in left toe(s): Secondary | ICD-10-CM | POA: Diagnosis not present

## 2021-01-08 DIAGNOSIS — M79674 Pain in right toe(s): Secondary | ICD-10-CM

## 2021-01-08 DIAGNOSIS — E1142 Type 2 diabetes mellitus with diabetic polyneuropathy: Secondary | ICD-10-CM

## 2021-01-08 DIAGNOSIS — B351 Tinea unguium: Secondary | ICD-10-CM

## 2021-01-12 ENCOUNTER — Encounter: Payer: Self-pay | Admitting: Podiatry

## 2021-01-12 ENCOUNTER — Telehealth: Payer: Self-pay

## 2021-01-12 NOTE — Telephone Encounter (Signed)
I called patients daughter to inquire about an incoming fax received from [facility] requesting refills on  Novolog Atorvastatin Isosorbide  Per Karna Christmas do not refill at this time, patient has recently switched insurance companies and she will confirm his pharmacy at his next appointment. Per Terri patient is not in need of any refills at this time.  Notation was made on incoming fax and it was faxed back to [facility]

## 2021-01-12 NOTE — Progress Notes (Signed)
  Subjective:  Patient ID: David Burton, male    DOB: September 04, 1945,  MRN: [id number]  75 y.o. male presents with preventative diabetic foot care and painful thick toenails that are difficult to trim. Pain interferes with ambulation. Aggravating factors include wearing enclosed shoe gear. Pain is relieved with periodic professional debridement..    Patient's blood sugar was 180 mg/dl today.  He states he continues to have numbness on the plantar aspect of both feet, but left is worse than the right foot. He will be having a vascular consult.  PCP: Sandrea Hughs, NP and last visit was: 12/08/2020.  Review of Systems: Negative except as noted in the HPI.   No Known Allergies  Objective:  There were no vitals filed for this visit. Constitutional Patient is a pleasant 75 y.o. African American male obese in NAD. AAO x 3.  Vascular Capillary fill time to digits <3 seconds b/l lower extremities. Palpable DP pulse(s) b/l lower extremities Palpable PT pulse(s) b/l lower extremities Pedal hair absent. Lower extremity skin temperature gradient within normal limits. Trace edema noted b/l lower extremities. No cyanosis or clubbing noted.  Neurologic Normal speech. Protective sensation intact 5/5 intact bilaterally with 10g monofilament b/l.  Dermatologic Pedal skin with normal turgor, texture and tone b/l lower extremities. Toenails 1-5 b/l elongated, discolored, dystrophic, thickened, crumbly with subungual debris and tenderness to dorsal palpation.  Orthopedic: Normal muscle strength 5/5 to all lower extremity muscle groups bilaterally. Hallux valgus with bunion deformity noted b/l lower extremities. Pes planus deformity noted b/l lower extremities.   Hemoglobin A1C Latest Ref Rng & Units 12/13/2020 09/06/2020 05/08/2020 03/02/2020  HGBA1C <5.7 % of total Hgb 8.5(H) 8.1(H) 6.6(H) 8.8(H)  Some recent data might be hidden   Assessment:   1. Pain due to onychomycosis of toenails of both feet   2. Type 2  diabetes, controlled, with peripheral neuropathy ([place])    Plan:  Patient was evaluated and treated and all questions answered.  Onychomycosis with pain -Nails palliatively debridement as below. -Educated on self-care  Procedure: Nail Debridement Rationale: Pain Type of Debridement: manual, sharp debridement. Instrumentation: Nail nipper, rotary burr. Number of Nails: 10  -Examined patient.He will be having vascular testing performed on his lower extremities. -Continue diabetic foot care principles. -Patient to continue soft, supportive shoe gear daily. -Toenails 1-5 b/l were debrided in length and girth with sterile nail nippers and dremel without iatrogenic bleeding.  -Patient to report any pedal injuries to medical professional immediately. -Patient/POA to call should there be question/concern in the interim.  Return in about 3 months (around 04/10/2021).  Marzetta Board, DPM

## 2021-01-26 ENCOUNTER — Other Ambulatory Visit: Payer: Self-pay

## 2021-01-26 DIAGNOSIS — R6889 Other general symptoms and signs: Secondary | ICD-10-CM

## 2021-01-29 ENCOUNTER — Other Ambulatory Visit: Payer: Self-pay

## 2021-01-29 ENCOUNTER — Ambulatory Visit (INDEPENDENT_AMBULATORY_CARE_PROVIDER_SITE_OTHER): Payer: Medicare Other | Admitting: Podiatry

## 2021-01-29 DIAGNOSIS — M2141 Flat foot [pes planus] (acquired), right foot: Secondary | ICD-10-CM

## 2021-01-29 DIAGNOSIS — M2011 Hallux valgus (acquired), right foot: Secondary | ICD-10-CM

## 2021-01-29 DIAGNOSIS — M2012 Hallux valgus (acquired), left foot: Secondary | ICD-10-CM

## 2021-01-29 DIAGNOSIS — M2142 Flat foot [pes planus] (acquired), left foot: Secondary | ICD-10-CM

## 2021-01-29 DIAGNOSIS — E1142 Type 2 diabetes mellitus with diabetic polyneuropathy: Secondary | ICD-10-CM

## 2021-01-29 NOTE — Progress Notes (Signed)
The patient presented to the office to day to pick up diabetic shoes and 3 pair diabetic custom inserts.  1 pair of inserts were put in the shoes and the shoes were fitted to the patient. The patient states they are comfortable and free of defect. He was satisfied with the fit of the shoe. Instructions for break in and wear were dispensed. The patient signed the delivery documentation and break in instruction form.  If any concerns or questions arise, he is instructed to call 

## 2021-02-01 ENCOUNTER — Other Ambulatory Visit: Payer: Self-pay | Admitting: *Deleted

## 2021-02-01 NOTE — Patient Outreach (Signed)
[facility]) Care Management  02/01/2021  David Burton Mar 28, 1946 [id number]   Outgoing call placed to member, state he is "good" but also report he is on the bus and unable to talk.  Call then placed to daughter, no answer, unable to leave voice message as mailbox is full.  Will follow up within the next 3-4 business days.  [name], [place], MSN [place] [phone]

## 2021-02-02 ENCOUNTER — Other Ambulatory Visit: Payer: Self-pay

## 2021-02-02 DIAGNOSIS — G894 Chronic pain syndrome: Secondary | ICD-10-CM

## 2021-02-02 MED ORDER — HYDROCODONE-ACETAMINOPHEN 10-325 MG PO TABS: 1.0000 | ORAL_TABLET | Freq: Three times a day (TID) | ORAL | 0 refills | Status: DC | PRN

## 2021-02-02 NOTE — Telephone Encounter (Signed)
Patient called requesting to get his Hydrocodone refilled on Sunday because he thinks the pharmacy will be closed on Monday. Medication last filled 01/05/2021 and contract is up to date. Medication pended and sent to Sherrie Mustache, NP Marlowe Sax, NP out of office) for approval/refusal.

## 2021-02-05 NOTE — Progress Notes (Signed)
[facility] [place]  ASSESSMENT / PLAN: 75 y.o. male with reassuring clinical exam and non-invasive vascular testing.  I counseled him on the benign nature of his findings.  Recommend continued good care of chronic comorbidities.  Patient should follow-up with me as needed.  CHIEF COMPLAINT: Concern for arterial disease  HISTORY OF PRESENT ILLNESS: David Burton is a 75 y.o. male referred to clinic for evaluation of possible peripheral arterial disease.  The patient reports occasional pain in his feet at night which is bothersome to him.  He has no pain with walking and is unlimited in his ability to ambulate.  He can shop for himself and does so frequently.  He has no symptoms typical of ischemic rest pain.  He has no ulceration about his feet.  He had a home health screening evaluation which was concerning for peripheral arterial disease, so he was referred to clinic.  Past Medical History:  Diagnosis Date   Bell palsy    CKD (chronic kidney disease), stage III ([place]) 12/16/2018   Coronary artery disease involving native coronary artery of native heart with unstable angina pectoris ([place]) 12/16/2018   GERD (gastroesophageal reflux disease)    Hypertension    Hypertension with heart disease 12/16/2018   Myocardial infarction ([place])    Pure hypercholesterolemia 12/16/2018   Stroke ([place])    Type 2 diabetes, controlled, with peripheral neuropathy ([place]) 12/16/2018    Past Surgical History:  Procedure Laterality Date   CARDIAC SURGERY     COLONOSCOPY WITH PROPOFOL N/A 01/02/2018   Procedure: COLONOSCOPY WITH PROPOFOL;  Surgeon: Carol Ada, MD;  Location: [facility];  Service: Endoscopy;  Laterality: N/A;   CORONARY STENT INTERVENTION N/A 01/26/2019   Procedure: CORONARY STENT INTERVENTION;  Surgeon: Adrian Prows, MD;  Location: [facility];  Service: Cardiovascular;  Laterality: N/A;   LEFT HEART CATH AND CORS/GRAFTS ANGIOGRAPHY N/A 01/05/2019   Procedure: LEFT HEART  CATH AND CORS/GRAFTS ANGIOGRAPHY;  Surgeon: Adrian Prows, MD;  Location: [facility];  Service: Cardiovascular;  Laterality: N/A;   POLYPECTOMY  01/02/2018   Procedure: POLYPECTOMY;  Surgeon: Carol Ada, MD;  Location: [facility];  Service: Endoscopy;;   stents      Family History  Problem Relation Age of Onset   Cancer Mother    Heart attack Father     Social History   Socioeconomic History   Marital status: Divorced    Spouse name: Not on file   Number of children: 3   Years of education: 59   Highest education level: 12th grade  Occupational History   Occupation: Retired  Tobacco Use   Smoking status: Former    Packs/day: 1.00    Years: 5.00    Pack years: 5.00    Types: Cigarettes    Quit date: 2005    Years since quitting: 17.6   Smokeless tobacco: Never  Vaping Use   Vaping Use: Never used  Substance and Sexual Activity   Alcohol use: Not Currently   Drug use: No   Sexual activity: Never  Other Topics Concern   Not on file  Social History Narrative   Tobacco use, amount per day now:   Past tobacco use, amount per day: Pack a week.   How many years did you use tobacco:   Alcohol use (drinks per week):   Diet:   Do you drink/eat things with caffeine:   Marital status:  What year were you married?   Do you live in a house, apartment, assisted living, condo, trailer, etc.? Apartment.   Is it one or more stories? Yes   How many persons live in your home? 1   Do you have pets in your home?( please list) No.   Highest Level of education completed: 8th Grade   Current or past profession: Administrator, Architect.    Do you exercise? No.                                    Type and how often? No   Do you have a living will? No   Do you have a DNR form?  No                                 If not, do you want to discuss one?   Do you have signed POA/HPOA forms?                        If so, please bring to you appointment     Do you have any difficulty bathing or dressing yourself? No.   Do you have difficulty preparing food or eating? No.   Do you have difficulty managing your medications? Yes.   Do you have any difficulty managing your finances? No.   Do you have any difficulty affording your medications? No.      Social Determinants of Health   Financial Resource Strain: Low Risk    Difficulty of Paying Living Expenses: Not hard at all  Food Insecurity: Food Insecurity Present   Worried About Charity fundraiser in the Last Year: Sometimes true   Arboriculturist in the Last Year: Sometimes true  Transportation Needs: No Transportation Needs   Lack of Transportation (Medical): No   Lack of Transportation (Non-Medical): No  Physical Activity: Inactive   Days of Exercise per Week: 0 days   Minutes of Exercise per Session: 0 min  Stress: No Stress Concern Present   Feeling of Stress : Only a little  Social Connections: Moderately Isolated   Frequency of Communication with Friends and Family: More than three times a week   Frequency of Social Gatherings with Friends and Family: More than three times a week   Attends Religious Services: More than 4 times per year   Active Member of [employer] or Organizations: No   Attends Archivist Meetings: Never   Marital Status: Divorced  Human resources officer Violence: Not At Risk   Fear of Current or Ex-Partner: No   Emotionally Abused: No   Physically Abused: No   Sexually Abused: No    No Known Allergies  Current Outpatient Medications  Medication Sig Dispense Refill   amLODipine (NORVASC) 10 MG tablet Take 1 tablet (10 mg total) by mouth daily. 30 tablet 0   atorvastatin (LIPITOR) 20 MG tablet TAKE 1 TABLET(20 MG) BY MOUTH DAILY 90 tablet 1   BD PEN NEEDLE NANO 2ND GEN 32G X 4 MM MISC USE 1 PEN THREE TIMES DAILY AS DIRECTED 100 each 3   clopidogrel (PLAVIX) 75 MG tablet Take 1 tablet (75 mg total) by mouth daily. Patient needs appointment for  additional refills. 90 tablet 0   ezetimibe (ZETIA) 10 MG tablet Take 1 tablet (10 mg total)  by mouth daily. 30 tablet 0   glucose blood (ACCU-CHEK AVIVA PLUS) test strip Use to test blood sugar 3 times daily. Dx: E11.42 300 each 1   HYDROcodone-acetaminophen (NORCO) 10-325 MG tablet Take 1 tablet by mouth 3 (three) times daily as needed. 90 tablet 0   ibuprofen (ADVIL) 600 MG tablet Take 600 mg by mouth every 6 (six) hours as needed.     insulin detemir (LEVEMIR) 100 UNIT/ML FlexPen Inject 22 Units into the skin at bedtime. 30 mL 1   insulin lispro (HUMALOG KWIKPEN) 100 UNIT/ML KwikPen Inject 8-10 units into the skin per sliding scale three times daily with meals. 45 mL 3   isosorbide mononitrate (IMDUR) 60 MG 24 hr tablet TAKE 1 TABLET(60 MG) BY MOUTH DAILY 90 tablet 1   magic mouthwash w/lidocaine SOLN Take 2 mLs by mouth 3 (three) times daily as needed for mouth pain. 15 mL 0   metoprolol tartrate (LOPRESSOR) 25 MG tablet Take 1 tablet (25 mg total) by mouth 2 (two) times daily. 180 tablet 0   nitroGLYCERIN (NITROSTAT) 0.4 MG SL tablet Dissolve one tablet under tongue as needed for chest pain. 30 tablet 3   NOVOLOG FLEXPEN 100 UNIT/ML FlexPen SMARTSIG:8-10 Unit(s) SUB-Q 3 Times Daily     REPATHA 140 MG/ML SOSY Inject 140 mg into the skin every 14 (fourteen) days. 2.1 mL 3   No current facility-administered medications for this visit.    REVIEW OF SYSTEMS:  [X]  denotes positive finding, [ ]  denotes negative finding Cardiac  Comments:  Chest pain or chest pressure:    Shortness of breath upon exertion: x   Short of breath when lying flat:    Irregular heart rhythm:        Vascular    Pain in calf, thigh, or hip brought on by ambulation: x   Pain in feet at night that wakes you up from your sleep:     Blood clot in your veins:    Leg swelling:         Pulmonary    Oxygen at home:    Productive cough:     Wheezing:         Neurologic    Sudden weakness in arms or legs:      Sudden numbness in arms or legs:  x Bottom of feet  Sudden onset of difficulty speaking or slurred speech:    Temporary loss of vision in one eye:     Problems with dizziness:         Gastrointestinal    Blood in stool:     Vomited blood:         Genitourinary    Burning when urinating:     Blood in urine:        Psychiatric    Major depression:         Hematologic    Bleeding problems:    Problems with blood clotting too easily:        Skin    Rashes or ulcers:        Constitutional    Fever or chills:      PHYSICAL EXAM Vitals:   02/06/21 1430  BP: 140/81  Pulse: 64  Resp: 20  Temp: 97.9 F (36.6 C)  SpO2: 98%  Weight: 255 lb (115.7 kg)  Height: 5\' 7"  (1.702 m)    Constitutional: chronically ill appearing. no distress. Appears well nourished.  Neurologic: CN intact. no focal findings. no sensory loss. Psychiatric:  Mood and affect symmetric and appropriate. Eyes:  No icterus. No conjunctival pallor. Ears, nose, throat:  mucous membranes moist. Midline trachea.  Cardiac: regular rate and rhythm.  Respiratory:  unlabored. Abdominal:  soft, non-tender, non-distended.  Peripheral vascular: 1+ DP pulse bilaterally Extremity: no edema. no cyanosis. no pallor.  Skin: no gangrene. no ulceration.  Lymphatic: no Stemmer's sign. no palpable lymphadenopathy.  PERTINENT LABORATORY AND RADIOLOGIC DATA  Most recent CBC CBC Latest Ref Rng & Units 12/13/2020 09/15/2020 09/14/2020  WBC 3.8 - 10.8 Thousand/uL 7.8 10.5 8.9  Hemoglobin 13.2 - 17.1 g/dL 13.6 12.4(L) 12.0(L)  Hematocrit 38.5 - 50.0 % 41.3 35.0(L) 33.5(L)  Platelets 140 - 400 Thousand/uL 141 178 156     Most recent CMP CMP Latest Ref Rng & Units 12/13/2020 09/15/2020 09/14/2020  Glucose 65 - 99 mg/dL 120(H) 175(H) 176(H)  BUN 7 - 25 mg/dL 29(H) 24(H) 38(H)  Creatinine 0.70 - 1.28 mg/dL 1.77(H) 1.43(H) 1.62(H)  Sodium 135 - 146 mmol/L 140 138 140  Potassium 3.5 - 5.3 mmol/L 4.7 3.7 3.4(L)  Chloride 98 - 110  mmol/L 110 113(H) 114(H)  CO2 20 - 32 mmol/L 21 17(L) 18(L)  Calcium 8.6 - 10.3 mg/dL 9.5 9.1 8.9  Total Protein 6.1 - 8.1 g/dL 6.8 7.0 6.4(L)  Total Bilirubin 0.2 - 1.2 mg/dL 0.5 0.7 0.6  Alkaline Phos 38 - 126 U/L - 58 53  AST 10 - 35 U/L 29 25 19   ALT 9 - 46 U/L 45 30 21    Renal function CrCl cannot be calculated (Patient's most recent lab result is older than the maximum 21 days allowed.).  Hgb A1c MFr Bld (% of total Hgb)  Date Value  12/13/2020 8.5 (H)    LDL Cholesterol (Calc)  Date Value Ref Range Status  12/13/2020 32 mg/dL (calc) Final    Comment:    Reference range: <100 . Desirable range <100 mg/dL for primary prevention;   <70 mg/dL for patients with CHD or diabetic patients  with > or = 2 CHD risk factors. Marland Kitchen LDL-C is now calculated using the Martin-Hopkins  calculation, which is a validated novel method providing  better accuracy than the Friedewald equation in the  estimation of LDL-C.  Cresenciano Genre et al. Annamaria Helling. 9371;696(78): 2061-2068  (http://education.[facility].com/faq/FAQ164)      LOWER EXTREMITY DOPPLER STUDY   Patient Name:  David Burton  Date of Exam:   02/06/2021  Medical Rec #: [id number]    Accession #:    [id number]  Date of Birth: 1946/03/04   Patient Gender: M  Patient Age:   37 years  Exam Location:  [facility]  Procedure:      VAS [place] ABI WITH/WO TBI  Referring Phys:    ---------------------------------------------------------------------------  -----     Indications: Claudication and pain involving the left calf which also  wakes               patient from sleep.   High Risk Factors: Hypertension, hyperlipidemia, Diabetes, past history of                     smoking, coronary artery disease, prior CVA.     Performing Technologist: Ronal Fear RVS, RCS      Examination Guidelines: A complete evaluation includes at minimum, Doppler  waveform signals and systolic blood pressure reading at the level of   bilateral  brachial, anterior tibial, and posterior tibial arteries, when vessel  segments  are accessible. Bilateral testing  is considered an integral part of a  complete  examination. Photoelectric Plethysmograph (PPG) waveforms and toe systolic  pressure readings are included as required and additional duplex testing  as  needed. Limited examinations for reoccurring indications may be performed  as  noted.      ABI Findings:  +---------+------------------+-----+---------+--------+  Right    Rt Pressure (mmHg)IndexWaveform Comment   +---------+------------------+-----+---------+--------+  Brachial 189                                       +---------+------------------+-----+---------+--------+  PTA      183               0.95 triphasic          +---------+------------------+-----+---------+--------+  DP       175               0.91 triphasic          +---------+------------------+-----+---------+--------+  Great Toe115               0.60                    +---------+------------------+-----+---------+--------+   +---------+------------------+-----+---------+-------+  Left     Lt Pressure (mmHg)IndexWaveform Comment  +---------+------------------+-----+---------+-------+  Brachial 192                                      +---------+------------------+-----+---------+-------+  PTA      192               1.00 triphasic         +---------+------------------+-----+---------+-------+  DP       185               0.96 triphasic         +---------+------------------+-----+---------+-------+  Great Toe118               0.61                   +---------+------------------+-----+---------+-------+   +-------+-----------+-----------+------------+------------+  ABI/TBIToday's ABIToday's TBIPrevious ABIPrevious TBI  +-------+-----------+-----------+------------+------------+  Right  0.95       0.60                                  +-------+-----------+-----------+------------+------------+  Left   1.00       0.61                                 +-------+-----------+-----------+------------+------------+       Yevonne Aline. Stanford Breed, MD [facility] Phone Number: [phone] 02/05/2021 9:24 PM  Total time spent on preparing this encounter including chart review, data review, collecting history, examining the patient, coordinating care for this new patient, 45 minutes.  Portions of this report may have been transcribed using voice recognition software.  Every effort has been made to ensure accuracy; however, inadvertent computerized transcription errors may still be present.

## 2021-02-06 ENCOUNTER — Ambulatory Visit (INDEPENDENT_AMBULATORY_CARE_PROVIDER_SITE_OTHER): Payer: Medicare Other | Admitting: Vascular Surgery

## 2021-02-06 ENCOUNTER — Encounter: Payer: Self-pay | Admitting: Vascular Surgery

## 2021-02-06 ENCOUNTER — Other Ambulatory Visit: Payer: Self-pay

## 2021-02-06 ENCOUNTER — Other Ambulatory Visit: Payer: Self-pay | Admitting: *Deleted

## 2021-02-06 ENCOUNTER — Ambulatory Visit (HOSPITAL_COMMUNITY)
Admission: RE | Admit: 2021-02-06 | Discharge: 2021-02-06 | Disposition: A | Discharge status: Home / Self Care | Payer: Medicare Other | Source: Ambulatory Visit | Attending: Vascular Surgery | Admitting: Vascular Surgery

## 2021-02-06 VITALS — BP 140/81 | HR 64 | Temp 97.9°F | Resp 20 | Ht 67.0 in | Wt 255.0 lb

## 2021-02-06 DIAGNOSIS — M79604 Pain in right leg: Secondary | ICD-10-CM

## 2021-02-06 DIAGNOSIS — R6889 Other general symptoms and signs: Secondary | ICD-10-CM | POA: Diagnosis not present

## 2021-02-06 DIAGNOSIS — M79605 Pain in left leg: Secondary | ICD-10-CM | POA: Diagnosis not present

## 2021-02-06 DIAGNOSIS — I70212 Atherosclerosis of native arteries of extremities with intermittent claudication, left leg: Secondary | ICD-10-CM

## 2021-02-06 NOTE — Patient Outreach (Signed)
[facility]) Care Management  02/06/2021  David Burton 08-06-1945 [id number]   Outgoing call placed to member's daughter.  Report member has been doing well, improving.  Denies any urgent concerns, encouraged to contact this care manager with questions.  Agrees to follow up within the next month.   Goals Addressed             This Visit's Progress    [facility] - Make and Keep All Appointments   On track    Timeframe:  Short-Term Goal Priority:  Medium Start Date:     5/5                      Expected End Date:  10/6   (extended)                  Barriers: Knowledge     - call to cancel if needed - keep a calendar with appointment dates    Why is this important?   Part of staying healthy is seeing the doctor for follow-up care.  If you forget your appointments, there are some things you can do to stay on track.    Notes:   5/5 - Verified with daughter that she would be keeping up with all member's appointments and providing transportation.  Appointment for diabetic shoes on 5/12  6/3 - Cardiology follow up on 6/14, follow up for diabetic foot exam on 8/8.  Will also have more teeth removed next week, preparing to be fitted for dentures  7/8 - Has more upcoming appointments for dental work to remove remaining teeth.  Confirms completed appointment with cardiology on 6/14, PCP appointment today.  8/4 - Daughter report member has follow up with PCP tomorrow to review medication changes for insulin as well as HTN  9/6 - Appointment with vascular today, PCP tomorrow, and cardiology on 9/15     [facility] - Prevent Falls and Injury   On track    Timeframe:  Long-Range Goal Priority:  High Start Date:           5/5                  Expected End Date:    11/5  Barriers: Health Behaviors Knowledge                          - always use handrails on the stairs - keep my cell phone with me always - learn how to get back up if I fall - use a nonslip pad with throw  rugs, or remove them completely - use a nightlight in the bathroom - wear my glasses and/or hearing aid    Why is this important?   Most falls happen when it is hard for you to walk safely. Your balance may be off because of an illness. You may have pain in your knees, hip or other joints.  You may be overly tired or taking medicines that make you sleepy. You may not be able to see or hear clearly.  Falls can lead to broken bones, bruises or other injuries.  There are things you can do to help prevent falling.     Notes:   5/5 - Education to prevent falls sent to member/daughter  5/3 - Daughter report he has completed work with PT, has increased in strength  7/8 - Denies recent falls, continues to exercise and build tolerance  8/4 -  Discussed with daughter interventions to prevent falls.  Encouraged to continue with daily exercises to promote strength and endurance  9/6 - Has not had any falls however daughter concerned about risk.  Has appointment today with vascular provider due to lower extremity numbness     [facility] - Track and Manage My Blood Pressure-Hypertension   On track    Timeframe:  Long-Range Goal Priority:  Medium Start Date:    7/8                         Expected End Date:      10/8                 Barriers: Health Behaviors    - check blood pressure daily - write blood pressure results in a log or diary    Why is this important?   You won't feel high blood pressure, but it can still hurt your blood vessels.  High blood pressure can cause heart or kidney problems. It can also cause a stroke.  Making lifestyle changes like losing a little weight or eating less salt will help.  Checking your blood pressure at home and at different times of the day can help to control blood pressure.  If the doctor prescribes medicine remember to take it the way the doctor ordered.  Call the office if you cannot afford the medicine or if there are questions about it.     Notes:    7/8 - Per cardiology note, blood pressure was slightly elevated.  Member report he has bought BP monitor, has [facility] calendar book to include log.  Will start daily monitoring  8/4 - Daughter report member has been monitoring blood pressure and glucose daily, does not have time to provide specific numbers as she is working  9/6 - Per daughter, blood pressure has been better since change in blood pressure medication last month, not with member currently, unable to provide details       [name], Therapist, sports, MSN [place] Manager [phone]

## 2021-02-07 ENCOUNTER — Encounter: Payer: Self-pay | Admitting: Family

## 2021-02-07 ENCOUNTER — Ambulatory Visit (INDEPENDENT_AMBULATORY_CARE_PROVIDER_SITE_OTHER): Payer: Medicare Other | Admitting: Family

## 2021-02-07 VITALS — BP 140/80 | HR 67 | Temp 97.3°F | Resp 18 | Ht 67.0 in | Wt 257.4 lb

## 2021-02-07 DIAGNOSIS — K029 Dental caries, unspecified: Secondary | ICD-10-CM

## 2021-02-07 DIAGNOSIS — E1142 Type 2 diabetes mellitus with diabetic polyneuropathy: Secondary | ICD-10-CM | POA: Diagnosis not present

## 2021-02-07 DIAGNOSIS — Z23 Encounter for immunization: Secondary | ICD-10-CM

## 2021-02-07 NOTE — Progress Notes (Addendum)
Provider: Marlowe Sax FNP-C  David Burton, [name], NP  Patient Care Team: Ellias Mcelreath, [name], NP as PCP - General (Family Medicine) [name], RN as [place] Management  Extended Emergency Contact Information Primary Emergency Contact: Dasean, Brow Mobile Phone: [phone] Relation: Daughter  Code Status:  DNR Goals of care: Advanced Directive information Advanced Directives 01/05/2021  Does Patient Have a Medical Advance Directive? Yes  Type of Advance Directive Out of facility DNR (pink MOST or yellow form)  Does patient want to make changes to medical advance directive? No - Patient declined  Copy of [name] in Chart? -  Would patient like information on creating a medical advance directive? -     Chief Complaint  Patient presents with   Acute Visit    Patient would like referral to have teeth pulled. Patient would like referral sent to dentist [facility]    Urine microalbumin,tetanus/tdap,zoster vaccine, COVID booster, Flu vaccine, Pneumonia vaccine    HPI:  Pt is a 75 y.o. male seen today for an acute visit for evaluation referral for teeth extraction.States daughter spoke to the dentist office thinks does not need referral but want to be cleared just in case. He is currently on Plavix which he is aware to hold prior to teeth extraction.Has held in the past then restarted. Has not required any nitro for several months. No CBG log today.continues to check CBG x 4 times daily but having difficulties due to Neuropathy.currently on Levemir at [place] three times daily with meals and Novolog Flexpen.He reports no hypo/hyper glycemia symptoms.  He is due for annual Influenza vaccine.Also due for first dose of Pneumococcal  vaccine 23. Script previously send to Pharmacy for Tdap vaccine but has not gone to get it.also previously advised to get Shingrix vaccine at his pharmacy.  Reminded to  get his COVID-19 booster vaccine at the Pharmacy too.    Past Medical History:  Diagnosis Date   Bell palsy    CKD (chronic kidney disease), stage III ([place]) 12/16/2018   Coronary artery disease involving native coronary artery of native heart with unstable angina pectoris ([place]) 12/16/2018   GERD (gastroesophageal reflux disease)    Hypertension    Hypertension with heart disease 12/16/2018   Myocardial infarction ([place])    Pure hypercholesterolemia 12/16/2018   Stroke ([place])    Type 2 diabetes, controlled, with peripheral neuropathy ([place]) 12/16/2018   Past Surgical History:  Procedure Laterality Date   CARDIAC SURGERY     COLONOSCOPY WITH PROPOFOL N/A 01/02/2018   Procedure: COLONOSCOPY WITH PROPOFOL;  Surgeon: Carol Ada, MD;  Location: [facility];  Service: Endoscopy;  Laterality: N/A;   CORONARY STENT INTERVENTION N/A 01/26/2019   Procedure: CORONARY STENT INTERVENTION;  Surgeon: Adrian Prows, MD;  Location: [facility];  Service: Cardiovascular;  Laterality: N/A;   LEFT HEART CATH AND CORS/GRAFTS ANGIOGRAPHY N/A 01/05/2019   Procedure: LEFT HEART CATH AND CORS/GRAFTS ANGIOGRAPHY;  Surgeon: Adrian Prows, MD;  Location: [facility];  Service: Cardiovascular;  Laterality: N/A;   POLYPECTOMY  01/02/2018   Procedure: POLYPECTOMY;  Surgeon: Carol Ada, MD;  Location: [facility];  Service: Endoscopy;;   stents      No Known Allergies  Outpatient Encounter Medications as of 02/07/2021  Medication Sig   amLODipine (NORVASC) 10 MG tablet Take 1 tablet (10 mg total) by mouth daily.   atorvastatin (LIPITOR) 20 MG tablet TAKE 1 TABLET(20 MG) BY MOUTH DAILY  BD PEN NEEDLE NANO 2ND GEN 32G X 4 MM MISC USE 1 PEN THREE TIMES DAILY AS DIRECTED   clopidogrel (PLAVIX) 75 MG tablet Take 1 tablet (75 mg total) by mouth daily. Patient needs appointment for additional refills.   ezetimibe (ZETIA) 10 MG tablet Take 1 tablet (10 mg total) by mouth daily.   glucose blood (ACCU-CHEK AVIVA PLUS)  test strip Use to test blood sugar 3 times daily. Dx: E11.42   HYDROcodone-acetaminophen (NORCO) 10-325 MG tablet Take 1 tablet by mouth 3 (three) times daily as needed. (Patient not taking: Reported on 02/06/2021)   ibuprofen (ADVIL) 600 MG tablet Take 600 mg by mouth every 6 (six) hours as needed.   insulin detemir (LEVEMIR) 100 UNIT/ML FlexPen Inject 22 Units into the skin at bedtime.   insulin lispro (HUMALOG KWIKPEN) 100 UNIT/ML KwikPen Inject 8-10 units into the skin per sliding scale three times daily with meals.   isosorbide mononitrate (IMDUR) 60 MG 24 hr tablet TAKE 1 TABLET(60 MG) BY MOUTH DAILY   magic mouthwash w/lidocaine SOLN Take 2 mLs by mouth 3 (three) times daily as needed for mouth pain.   metoprolol tartrate (LOPRESSOR) 25 MG tablet Take 1 tablet (25 mg total) by mouth 2 (two) times daily.   nitroGLYCERIN (NITROSTAT) 0.4 MG SL tablet Dissolve one tablet under tongue as needed for chest pain.   NOVOLOG FLEXPEN 100 UNIT/ML FlexPen SMARTSIG:8-10 Unit(s) SUB-Q 3 Times Daily   REPATHA 140 MG/ML SOSY Inject 140 mg into the skin every 14 (fourteen) days.   No facility-administered encounter medications on file as of 02/07/2021.    Review of Systems  Constitutional:  Negative for appetite change, chills, fatigue, fever and unexpected weight change.  HENT:  Positive for dental problem. Negative for congestion, ear discharge, ear pain, facial swelling, hearing loss, nosebleeds, postnasal drip, rhinorrhea, sinus pressure, sinus pain, sneezing, sore throat and trouble swallowing.        Has upcoming appointment for teeth extraction   Respiratory:  Negative for cough, chest tightness, shortness of breath and wheezing.   Cardiovascular:  Negative for chest pain, palpitations and leg swelling.  Gastrointestinal:  Negative for abdominal distention, abdominal pain, blood in stool, constipation, diarrhea, nausea and vomiting.  Musculoskeletal:  Negative for arthralgias, back pain, gait problem,  joint swelling and myalgias.  Skin:  Negative for color change, pallor, rash and wound.  Neurological:  Negative for dizziness, syncope, weakness, light-headedness, numbness and headaches.  Hematological:  Does not bruise/bleed easily.   Immunization History  Administered Date(s) Administered   PFIZER(Purple Top)SARS-COV-2 Vaccination 08/13/2019, 09/06/2019   Pneumococcal Conjugate-13 01/14/2020   Pertinent  Health Maintenance Due  Topic Date Due   URINE MICROALBUMIN  Never done   INFLUENZA VACCINE  Never done   PNA vac Low Risk Adult (2 of 2 - PPSV23) 01/13/2021   HEMOGLOBIN A1C  06/15/2021   FOOT EXAM  10/06/2021   OPHTHALMOLOGY EXAM  11/27/2021   COLONOSCOPY (Pts 45-34yrs Insurance coverage will need to be confirmed)  01/03/2028   Fall Risk  02/07/2021 01/05/2021 12/27/2020 12/08/2020 09/21/2020  Falls in the past year? 0 0 0 0 1  Number falls in past yr: 0 0 0 0 0  Injury with Fall? 0 0 0 0 0  Risk for fall due to : No Fall Risks No Fall Risks No Fall Risks No Fall Risks History of fall(s);Impaired balance/gait;Impaired mobility  Follow up Falls evaluation completed Falls evaluation completed Falls evaluation completed Falls evaluation completed Education provided;Falls prevention discussed  Functional Status Survey:    Vitals:   02/07/21 1353  BP: 140/80  Pulse: 67  Resp: 18  Temp: (!) 97.3 F (36.3 C)  TempSrc: Temporal  SpO2: 98%  Weight: 257 lb 6.4 oz (116.8 kg)  Height: 5\' 7"  (1.702 m)   Body mass index is 40.31 kg/m. Physical Exam Vitals reviewed.  Constitutional:      General: He is not in acute distress.    Appearance: Normal appearance. He is obese. He is not ill-appearing or diaphoretic.  HENT:     Head: Normocephalic.     Nose: Nose normal. No congestion or rhinorrhea.     Mouth/Throat:     Mouth: Mucous membranes are moist.     Pharynx: Oropharynx is clear. No oropharyngeal exudate or posterior oropharyngeal erythema.  Eyes:     General: No scleral  icterus.       Right eye: No discharge.        Left eye: No discharge.     Extraocular Movements: Extraocular movements intact.     Conjunctiva/sclera: Conjunctivae normal.     Pupils: Pupils are equal, round, and reactive to light.  Neck:     Vascular: No carotid bruit.  Cardiovascular:     Rate and Rhythm: Normal rate and regular rhythm.     Pulses: Normal pulses.     Heart sounds: Normal heart sounds. No murmur heard.   No friction rub. No gallop.  Pulmonary:     Effort: Pulmonary effort is normal. No respiratory distress.     Breath sounds: Normal breath sounds. No wheezing, rhonchi or rales.  Chest:     Chest wall: No tenderness.  Abdominal:     General: Bowel sounds are normal. There is no distension.     Palpations: Abdomen is soft. There is no mass.     Tenderness: There is no abdominal tenderness. There is no right CVA tenderness, left CVA tenderness, guarding or rebound.  Musculoskeletal:        General: No swelling or tenderness. Normal range of motion.     Cervical back: Normal range of motion. No rigidity or tenderness.     Right lower leg: No edema.     Left lower leg: No edema.  Lymphadenopathy:     Cervical: No cervical adenopathy.  Skin:    General: Skin is warm and dry.     Coloration: Skin is not pale.     Findings: No bruising, erythema or rash.  Neurological:     Mental Status: He is alert and oriented to person, place, and time.     Motor: No weakness.     Coordination: Coordination normal.     Gait: Gait normal.  Psychiatric:        Mood and Affect: Mood normal.        Speech: Speech normal.        Behavior: Behavior normal.        Thought Content: Thought content normal.        Judgment: Judgment normal.    Labs reviewed: Recent Labs    09/13/20 1630 09/14/20 0414 09/15/20 0147 12/13/20 1328  NA  --  140 138 140  K 3.7 3.4* 3.7 4.7  CL  --  114* 113* 110  CO2  --  18* 17* 21  GLUCOSE  --  176* 175* 120*  BUN  --  38* 24* 29*   CREATININE  --  1.62* 1.43* 1.77*  CALCIUM  --  8.9 9.1 9.5  MG 2.2 1.9 1.9  --   PHOS  --  1.6* 2.4*  --    Recent Labs    09/11/20 1019 09/14/20 0414 09/15/20 0147 12/13/20 1328  AST 17 19 25 29   ALT 16 21 30  45  ALKPHOS 75 53 58  --   BILITOT 0.9 0.6 0.7 0.5  PROT 8.0 6.4* 7.0 6.8  ALBUMIN 3.8 2.9* 3.1*  --    Recent Labs    09/14/20 0414 09/15/20 0147 12/13/20 1328  WBC 8.9 10.5 7.8  NEUTROABS 5.3 6.0 4,618  HGB 12.0* 12.4* 13.6  HCT 33.5* 35.0* 41.3  MCV 88.4 88.2 91.4  PLT 156 178 141   Lab Results  Component Value Date   TSH 4.89 (H) 12/13/2020   Lab Results  Component Value Date   HGBA1C 8.5 (H) 12/13/2020   Lab Results  Component Value Date   CHOL 92 12/13/2020   HDL 43 12/13/2020   LDLCALC 32 12/13/2020   TRIG 90 12/13/2020   CHOLHDL 2.1 12/13/2020    Significant Diagnostic Results in last 30 days:  VAS [place] ABI WITH/WO TBI  Result Date: 02/06/2021  LOWER EXTREMITY DOPPLER STUDY Patient Name:  David Burton  Date of Exam:   02/06/2021 Medical Rec #: [id number]    Accession #:    [id number] Date of Birth: 09-02-1945   Patient Gender: M Patient Age:   75 years Exam Location:  [facility] Procedure:      VAS [place] ABI WITH/WO TBI Referring Phys: --------------------------------------------------------------------------------  Indications: Claudication and pain involving the left calf which also wakes              patient from sleep. High Risk Factors: Hypertension, hyperlipidemia, Diabetes, past history of                    smoking, coronary artery disease, prior CVA.  Performing Technologist: Ronal Fear RVS, RCS  Examination Guidelines: A complete evaluation includes at minimum, Doppler waveform signals and systolic blood pressure reading at the level of bilateral brachial, anterior tibial, and posterior tibial arteries, when vessel segments are accessible. Bilateral testing is considered an integral part of a complete examination.  Photoelectric Plethysmograph (PPG) waveforms and toe systolic pressure readings are included as required and additional duplex testing as needed. Limited examinations for reoccurring indications may be performed as noted.  ABI Findings: +---------+------------------+-----+---------+--------+ Right    Rt Pressure (mmHg)IndexWaveform Comment  +---------+------------------+-----+---------+--------+ Brachial 189                                      +---------+------------------+-----+---------+--------+ PTA      183               0.95 triphasic         +---------+------------------+-----+---------+--------+ DP       175               0.91 triphasic         +---------+------------------+-----+---------+--------+ Great Toe115               0.60                   +---------+------------------+-----+---------+--------+ +---------+------------------+-----+---------+-------+ Left     Lt Pressure (mmHg)IndexWaveform Comment +---------+------------------+-----+---------+-------+ Brachial 192                                     +---------+------------------+-----+---------+-------+  PTA      192               1.00 triphasic        +---------+------------------+-----+---------+-------+ DP       185               0.96 triphasic        +---------+------------------+-----+---------+-------+ Great Toe118               0.61                  +---------+------------------+-----+---------+-------+ +-------+-----------+-----------+------------+------------+ ABI/TBIToday's ABIToday's TBIPrevious ABIPrevious TBI +-------+-----------+-----------+------------+------------+ Right  0.95       0.60                                +-------+-----------+-----------+------------+------------+ Left   1.00       0.61                                +-------+-----------+-----------+------------+------------+   Summary: Right: Resting right ankle-brachial index is within normal  range. No evidence of significant right lower extremity arterial disease. The right toe-brachial index is abnormal. Left: Resting left ankle-brachial index is within normal range. No evidence of significant left lower extremity arterial disease. The left toe-brachial index is abnormal.  *See table(s) above for measurements and observations.  Electronically signed by Jamelle Haring on 02/06/2021 at 4:09:12 PM.    Final     Assessment/Plan 1. Need for influenza vaccination Afebrile. Asymptomatic Influenza vaccine administered by [employer] today no reaction reported.  - Flu Vaccine QUAD High Dose(Fluad)  2. Need for vaccination against Streptococcus pneumoniae 1st dose of PNA vac 23 given today by [facility] no reaction reported.  - Pneumococcal polysaccharide vaccine 23-valent greater than or equal to 2yo subcutaneous/IM  3. Type 2 diabetes, controlled, with peripheral neuropathy (HCC) Lab Results  Component Value Date   HGBA1C 8.5 (H) 12/13/2020  Reports CBG in the 100's.check CBG before meals and at bedtime.No log for review today. Continue on Novolog,Humalog and Levemir  Annual eye and foot exam up to date - Microalbumin/Creatinine Ratio, Urine - Influenza vaccine and PNA vac 23 given today as above - Advised to get Shingrix,Tdap vaccine and COVID-19 booster vaccine at his pharmacy.  4. Dental caries Has upcoming appointment for teeth extraction no date provided. Will need to hold Plavix 7 days prior to teeth extraction to prevent bleeding.   Family/ staff Communication: Reviewed plan of care with patient verbalized understanding.  Labs/tests ordered: - Microalbumin/Creatinine Ratio, Urine  Next Appointment: Has appointment 03/16/2021   Sandrea Hughs, NP

## 2021-02-08 LAB — MICROALBUMIN / CREATININE URINE RATIO
Creatinine, Urine: 185 mg/dL (ref 20–320)
Microalb Creat Ratio: 52 mcg/mg creat — ABNORMAL HIGH (ref <30)
Microalb, Ur: 9.7 mg/dL

## 2021-02-15 ENCOUNTER — Ambulatory Visit: Payer: Medicare Other | Admitting: Cardiology

## 2021-02-19 ENCOUNTER — Other Ambulatory Visit: Payer: Self-pay | Admitting: Cardiology

## 2021-02-19 DIAGNOSIS — Z955 Presence of coronary angioplasty implant and graft: Secondary | ICD-10-CM

## 2021-02-19 DIAGNOSIS — Z8673 Personal history of transient ischemic attack (TIA), and cerebral infarction without residual deficits: Secondary | ICD-10-CM

## 2021-02-27 ENCOUNTER — Other Ambulatory Visit: Payer: Self-pay

## 2021-02-27 ENCOUNTER — Ambulatory Visit (INDEPENDENT_AMBULATORY_CARE_PROVIDER_SITE_OTHER): Payer: Medicare Other | Admitting: Family

## 2021-02-27 ENCOUNTER — Telehealth: Payer: Self-pay

## 2021-02-27 ENCOUNTER — Encounter: Payer: Self-pay | Admitting: Family

## 2021-02-27 DIAGNOSIS — Z0000 Encounter for general adult medical examination without abnormal findings: Secondary | ICD-10-CM | POA: Diagnosis not present

## 2021-02-27 NOTE — Patient Instructions (Signed)
[name] , Thank you for taking time to come for your Medicare Wellness Visit. I appreciate your ongoing commitment to your health goals. Please review the following plan we discussed and let me know if I can assist you in the future.   Screening recommendations/referrals: Colonoscopy: Up to date  Recommended yearly ophthalmology/optometry visit for glaucoma screening and checkup Recommended yearly dental visit for hygiene and checkup  Vaccinations: Influenza vaccine : Up to date  Pneumococcal vaccine: due on next visit  Tdap vaccine: Due please get Tdap vaccine at your pharmacy  Shingles vaccine : Due Please get Shingrix vaccine at your pharmacy.     Advanced directives: No   Conditions/risks identified: Type 2 Diabetes Hypertension,Male gender,Microalbuminuria,Advance age   Next appointment: 1 year   Preventive Care 22 Years and Older, Male Preventive care refers to lifestyle choices and visits with your health care provider that can promote health and wellness. What does preventive care include? A yearly physical exam. This is also called an annual well check. Dental exams once or twice a year. Routine eye exams. Ask your health care provider how often you should have your eyes checked. Personal lifestyle choices, including: Daily care of your teeth and gums. Regular physical activity. Eating a healthy diet. Avoiding tobacco and drug use. Limiting alcohol use. Practicing safe sex. Taking low doses of aspirin every day. Taking vitamin and mineral supplements as recommended by your health care provider. What happens during an annual well check? The services and screenings done by your health care provider during your annual well check will depend on your age, overall health, lifestyle risk factors, and family history of disease. Counseling  Your health care provider may ask you questions about your: Alcohol use. Tobacco use. Drug use. Emotional well-being. Home and  relationship well-being. Sexual activity. Eating habits. History of falls. Memory and ability to understand (cognition). Work and work Statistician. Screening  You may have the following tests or measurements: Height, weight, and BMI. Blood pressure. Lipid and cholesterol levels. These may be checked every 5 years, or more frequently if you are over 71 years old. Skin check. Lung cancer screening. You may have this screening every year starting at age 73 if you have a 30-pack-year history of smoking and currently smoke or have quit within the past 15 years. Fecal occult blood test (FOBT) of the stool. You may have this test every year starting at age 54. Flexible sigmoidoscopy or colonoscopy. You may have a sigmoidoscopy every 5 years or a colonoscopy every 10 years starting at age 92. Prostate cancer screening. Recommendations will vary depending on your family history and other risks. Hepatitis C blood test. Hepatitis B blood test. Sexually transmitted disease (STD) testing. Diabetes screening. This is done by checking your blood sugar (glucose) after you have not eaten for a while (fasting). You may have this done every 1-3 years. Abdominal aortic aneurysm (AAA) screening. You may need this if you are a current or former smoker. Osteoporosis. You may be screened starting at age 74 if you are at high risk. Talk with your health care provider about your test results, treatment options, and if necessary, the need for more tests. Vaccines  Your health care provider may recommend certain vaccines, such as: Influenza vaccine. This is recommended every year. Tetanus, diphtheria, and acellular pertussis (Tdap, Td) vaccine. You may need a Td booster every 10 years. Zoster vaccine. You may need this after age 30. Pneumococcal 13-valent conjugate (PCV13) vaccine. One dose is recommended after  age 35. Pneumococcal polysaccharide (PPSV23) vaccine. One dose is recommended after age 26. Talk to your  health care provider about which screenings and vaccines you need and how often you need them. This information is not intended to replace advice given to you by your health care provider. Make sure you discuss any questions you have with your health care provider. Document Released: 06/16/2015 Document Revised: 02/07/2016 Document Reviewed: 03/21/2015 Elsevier Interactive Patient Education  2017 [place] Prevention in the Home Falls can cause injuries. They can happen to people of all ages. There are many things you can do to make your home safe and to help prevent falls. What can I do on the outside of my home? Regularly fix the edges of walkways and driveways and fix any cracks. Remove anything that might make you trip as you walk through a door, such as a raised step or threshold. Trim any bushes or trees on the path to your home. Use bright outdoor lighting. Clear any walking paths of anything that might make someone trip, such as rocks or tools. Regularly check to see if handrails are loose or broken. Make sure that both sides of any steps have handrails. Any raised decks and porches should have guardrails on the edges. Have any leaves, snow, or ice cleared regularly. Use sand or salt on walking paths during winter. Clean up any spills in your garage right away. This includes oil or grease spills. What can I do in the bathroom? Use night lights. Install grab bars by the toilet and in the tub and shower. Do not use towel bars as grab bars. Use non-skid mats or decals in the tub or shower. If you need to sit down in the shower, use a plastic, non-slip stool. Keep the floor dry. Clean up any water that spills on the floor as soon as it happens. Remove soap buildup in the tub or shower regularly. Attach bath mats securely with double-sided non-slip rug tape. Do not have throw rugs and other things on the floor that can make you trip. What can I do in the bedroom? Use night  lights. Make sure that you have a light by your bed that is easy to reach. Do not use any sheets or blankets that are too big for your bed. They should not hang down onto the floor. Have a firm chair that has side arms. You can use this for support while you get dressed. Do not have throw rugs and other things on the floor that can make you trip. What can I do in the kitchen? Clean up any spills right away. Avoid walking on wet floors. Keep items that you use a lot in easy-to-reach places. If you need to reach something above you, use a strong step stool that has a grab bar. Keep electrical cords out of the way. Do not use floor polish or wax that makes floors slippery. If you must use wax, use non-skid floor wax. Do not have throw rugs and other things on the floor that can make you trip. What can I do with my stairs? Do not leave any items on the stairs. Make sure that there are handrails on both sides of the stairs and use them. Fix handrails that are broken or loose. Make sure that handrails are as long as the stairways. Check any carpeting to make sure that it is firmly attached to the stairs. Fix any carpet that is loose or worn. Avoid having throw rugs  at the top or bottom of the stairs. If you do have throw rugs, attach them to the floor with carpet tape. Make sure that you have a light switch at the top of the stairs and the bottom of the stairs. If you do not have them, ask someone to add them for you. What else can I do to help prevent falls? Wear shoes that: Do not have high heels. Have rubber bottoms. Are comfortable and fit you well. Are closed at the toe. Do not wear sandals. If you use a stepladder: Make sure that it is fully opened. Do not climb a closed stepladder. Make sure that both sides of the stepladder are locked into place. Ask someone to hold it for you, if possible. Clearly mark and make sure that you can see: Any grab bars or handrails. First and last  steps. Where the edge of each step is. Use tools that help you move around (mobility aids) if they are needed. These include: Canes. Walkers. Scooters. Crutches. Turn on the lights when you go into a dark area. Replace any light bulbs as soon as they burn out. Set up your furniture so you have a clear path. Avoid moving your furniture around. If any of your floors are uneven, fix them. If there are any pets around you, be aware of where they are. Review your medicines with your doctor. Some medicines can make you feel dizzy. This can increase your chance of falling. Ask your doctor what other things that you can do to help prevent falls. This information is not intended to replace advice given to you by your health care provider. Make sure you discuss any questions you have with your health care provider. Document Released: 03/16/2009 Document Revised: 10/26/2015 Document Reviewed: 06/24/2014 Elsevier Interactive Patient Education  2017 [employer].

## 2021-02-27 NOTE — Progress Notes (Signed)
Subjective:   David Burton is a 75 y.o. male who presents for Medicare Annual/Subsequent preventive examination.  Review of Systems    Cardiac Risk Factors include: diabetes mellitus;hypertension;obesity (BMI >30kg/m2);male gender;microalbuminuria;advanced age (>56men, >7 women)     Objective:    Today's Vitals   02/27/21 1436  PainSc: 8    There is no height or weight on file to calculate BMI.  Advanced Directives 02/27/2021 01/05/2021 12/27/2020 12/08/2020 09/11/2020 09/08/2020 08/30/2020  Does Patient Have a Medical Advance Directive? Yes Yes No No No No No  Type of Advance Directive Out of facility DNR (pink MOST or yellow form) Out of facility DNR (pink MOST or yellow form) - - - - -  Does patient want to make changes to medical advance directive? No - Patient declined No - Patient declined - - - - -  Copy of Press photographer in [place]  Would patient like information on creating a medical advance directive? - - No - Patient declined No - Patient declined No - Patient declined No - Patient declined No - Patient declined  Pre-existing out of facility DNR order (yellow form or pink MOST form) Yellow form placed in chart (order not valid for inpatient use);Pink MOST form placed in chart (order not valid for inpatient use) - - - - - -    Current Medications (verified) Outpatient Encounter Medications as of 02/27/2021  Medication Sig   amLODipine (NORVASC) 10 MG tablet Take 1 tablet (10 mg total) by mouth daily.   atorvastatin (LIPITOR) 20 MG tablet TAKE 1 TABLET(20 MG) BY MOUTH DAILY   BD PEN NEEDLE NANO 2ND GEN 32G X 4 MM MISC USE 1 PEN THREE TIMES DAILY AS DIRECTED   clopidogrel (PLAVIX) 75 MG tablet TAKE 1 TABLET(75 MG) BY MOUTH DAILY   ezetimibe (ZETIA) 10 MG tablet Take 1 tablet (10 mg total) by mouth daily.   glucose blood (ACCU-CHEK AVIVA PLUS) test strip Use to test blood sugar 3 times daily. Dx: E11.42   HYDROcodone-acetaminophen (NORCO) 10-325 MG tablet  Take 1 tablet by mouth 3 (three) times daily as needed.   ibuprofen (ADVIL) 600 MG tablet Take 600 mg by mouth every 6 (six) hours as needed.   insulin detemir (LEVEMIR) 100 UNIT/ML FlexPen Inject 22 Units into the skin at bedtime.   insulin lispro (HUMALOG KWIKPEN) 100 UNIT/ML KwikPen Inject 8-10 units into the skin per sliding scale three times daily with meals.   isosorbide mononitrate (IMDUR) 60 MG 24 hr tablet TAKE 1 TABLET(60 MG) BY MOUTH DAILY   magic mouthwash w/lidocaine SOLN Take 2 mLs by mouth 3 (three) times daily as needed for mouth pain.   metoprolol tartrate (LOPRESSOR) 25 MG tablet Take 1 tablet (25 mg total) by mouth 2 (two) times daily.   nitroGLYCERIN (NITROSTAT) 0.4 MG SL tablet Dissolve one tablet under tongue as needed for chest pain.   REPATHA 140 MG/ML SOSY Inject 140 mg into the skin every 14 (fourteen) days.   NOVOLOG FLEXPEN 100 UNIT/ML FlexPen SMARTSIG:8-10 Unit(s) SUB-Q 3 Times Daily (Patient not taking: Reported on 02/27/2021)   No facility-administered encounter medications on file as of 02/27/2021.    Allergies (verified) Patient has no known allergies.   History: Past Medical History:  Diagnosis Date   Bell palsy    CKD (chronic kidney disease), stage III ([place]) 12/16/2018   Coronary artery disease involving native coronary artery of native heart with unstable angina pectoris ([place]) 12/16/2018  GERD (gastroesophageal reflux disease)    Hypertension    Hypertension with heart disease 12/16/2018   Myocardial infarction [facility])    Pure hypercholesterolemia 12/16/2018   Stroke [facility])    Type 2 diabetes, controlled, with peripheral neuropathy ([place]) 12/16/2018   Past Surgical History:  Procedure Laterality Date   CARDIAC SURGERY     COLONOSCOPY WITH PROPOFOL N/A 01/02/2018   Procedure: COLONOSCOPY WITH PROPOFOL;  Surgeon: Carol Ada, MD;  Location: [facility] ENDOSCOPY;  Service: Endoscopy;  Laterality: N/A;   CORONARY STENT INTERVENTION N/A 01/26/2019   Procedure:  CORONARY STENT INTERVENTION;  Surgeon: Adrian Prows, MD;  Location: [place] CV LAB;  Service: Cardiovascular;  Laterality: N/A;   LEFT HEART CATH AND CORS/GRAFTS ANGIOGRAPHY N/A 01/05/2019   Procedure: LEFT HEART CATH AND CORS/GRAFTS ANGIOGRAPHY;  Surgeon: Adrian Prows, MD;  Location: [place] [facility] LAB;  Service: Cardiovascular;  Laterality: N/A;   POLYPECTOMY  01/02/2018   Procedure: POLYPECTOMY;  Surgeon: Carol Ada, MD;  Location: [facility] ENDOS[facility];  Service: Endoscopy;;   stents     Family History  Problem Relation Age of Onset   Cancer Mother    Heart attack Father    Social History   Socioeconomic History   Marital status: Divorced    Spouse name: Not on file   Number of children: 3   Years of education: 14   Highest education level: 12th grade  Occupational History   Occupation: Retired  Tobacco Use   Smoking status: Former    Packs/day: 1.00    Years: 5.00    Pack years: 5.00    Types: Cigarettes    Quit date: 2005    Years since quitting: 17.7   Smokeless tobacco: Never  Vaping Use   Vaping Use: Never used  Substance and Sexual Activity   Alcohol use: Not Currently   Drug use: No   Sexual activity: Never  Other Topics Concern   Not on file  Social History Narrative   Tobacco use, amount per day now:   Past tobacco use, amount per day: Pack a week.   How many years did you use tobacco:   Alcohol use (drinks per week):   Diet:   Do you drink/eat things with caffeine:   Marital status:                                  What year were you married?   Do you live in a house, apartment, assisted living, condo, trailer, etc.? Apartment.   Is it one or more stories? Yes   How many persons live in your home? 1   Do you have pets in your home?( please list) No.   Highest Level of education completed: 8th Grade   Current or past profession: Administrator, Architect.    Do you exercise? No.                                    Type and how often? No   Do you have a living  will? No   Do you have a DNR form?  No                                 If not, do you want to discuss one?   Do you  have signed POA/HPOA forms?                        If so, please bring to you appointment    Do you have any difficulty bathing or dressing yourself? No.   Do you have difficulty preparing food or eating? No.   Do you have difficulty managing your medications? Yes.   Do you have any difficulty managing your finances? No.   Do you have any difficulty affording your medications? No.      Social Determinants of Health   Financial Resource Strain: Low Risk    Difficulty of Paying Living Expenses: Not hard at all  Food Insecurity: Food Insecurity Present   Worried About Charity fundraiser in the Last Year: Sometimes true   Arboriculturist in the Last Year: Sometimes true  Transportation Needs: No Transportation Needs   Lack of Transportation (Medical): No   Lack of Transportation (Non-Medical): No  Physical Activity: Inactive   Days of Exercise per Week: 0 days   Minutes of Exercise per Session: 0 min  Stress: No Stress Concern Present   Feeling of Stress : Only a little  Social Connections: Moderately Isolated   Frequency of Communication with Friends and Family: More than three times a week   Frequency of Social Gatherings with Friends and Family: More than three times a week   Attends Religious Services: More than 4 times per year   Active Member of [employer] or Organizations: No   Attends Music therapist: Never   Marital Status: Divorced    Tobacco Counseling Counseling given: Not Answered   Clinical Intake:  Pre-visit preparation completed: No  Pain : 0-10 Pain Score: 8  Pain Type: Chronic pain Pain Location: Leg Pain Orientation: Left, Right Pain Radiating Towards: no Pain Descriptors / Indicators: Heaviness, Aching Pain Onset: Other (comment) (one) Pain Frequency: Constant Pain Relieving Factors: None Effect of Pain on Daily Activities:  yes  Pain Relieving Factors: None  BMI - recorded: 40.31 Nutritional Status: BMI > 30  Obese Nutritional Risks: None Diabetes: Yes (100's -150's) CBG done?: Yes (200) CBG resulted in Enter/ Edit results?: No (recall 100's - 200's) Did pt. bring in CBG monitor from home?: No (telephone)  How often do you need to have someone help you when you read instructions, pamphlets, or other written materials from your doctor or pharmacy?: 1 - Never What is the last grade level you completed in school?: 10 grade  Diabetic?yes   Interpreter Needed?: No  Information entered by :: Spencer Shakeema Lippman,FNP-C   Activities of Daily Living In your present state of health, do you have any difficulty performing the following activities: 02/27/2021 09/21/2020  Hearing? N -  Vision? N -  Difficulty concentrating or making decisions? N -  Walking or climbing stairs? N -  Dressing or bathing? N -  Doing errands, shopping? Y -  Comment daughter assist with driving -  Conservation officer, nature and eating ? N N  Using the Toilet? Y N  Comment constipation -  In the past six months, have you accidently leaked urine? N N  Do you have problems with loss of bowel control? N N  Managing your Medications? N N  Managing your Finances? N N  Housekeeping or managing your Housekeeping? N Y  Comment - Applying for PCS.  Some recent data might be hidden    Patient Care Team: Amai Cappiello, [name], NP as PCP -  General (Family Medicine) [name], RN as [place] any recent [place] you may have received from other than [facility] providers in the past year (date may be approximate).     Assessment:   This is a routine wellness examination for E[place].  Hearing/Vision screen Hearing Screening - Comments:: Patient has no hearing problems. Vision Screening - Comments:: Patient has no vision problems. Patient had eye exam about 6 months ago can't remember eye doctor  Dietary issues and  exercise activities discussed: Current Exercise Habits: Home exercise routine, Type of exercise: walking, Time (Minutes): 30, Frequency (Times/Week): 3, Weekly Exercise (Minutes/Week): 90, Exercise limited by: None identified   Goals Addressed             This Visit's Progress    Patient Stated       Exercise at least three times per week      [facility] - Make and Keep All Appointments   On track    Timeframe:  Short-Term Goal Priority:  Medium Start Date:     5/5                      Expected End Date:  10/6   (extended)                  Barriers: Knowledge     - call to cancel if needed - keep a calendar with appointment dates    Why is this important?   Part of staying healthy is seeing the doctor for follow-up care.  If you forget your appointments, there are some things you can do to stay on track.    Notes:   5/5 - Verified with daughter that she would be keeping up with all member's appointments and providing transportation.  Appointment for diabetic shoes on 5/12  6/3 - Cardiology follow up on 6/14, follow up for diabetic foot exam on 8/8.  Will also have more teeth removed next week, preparing to be fitted for dentures  7/8 - Has more upcoming appointments for dental work to remove remaining teeth.  Confirms completed appointment with cardiology on 6/14, PCP appointment today.  8/4 - Daughter report member has follow up with PCP tomorrow to review medication changes for insulin as well as HTN  9/6 - Appointment with vascular today, PCP tomorrow, and cardiology on 9/15     [facility] - Prevent Falls and Injury   On track    Timeframe:  Long-Range Goal Priority:  High Start Date:           5/5                  Expected End Date:    11/5  Barriers: Health Behaviors Knowledge                          - always use handrails on the stairs - keep my cell phone with me always - learn how to get back up if I fall - use a nonslip pad with throw rugs, or remove them  completely - use a nightlight in the bathroom - wear my glasses and/or hearing aid    Why is this important?   Most falls happen when it is hard for you to walk safely. Your balance may be off because of an illness. You may have pain in your knees, hip or other joints.  You may be overly tired  or taking medicines that make you sleepy. You may not be able to see or hear clearly.  Falls can lead to broken bones, bruises or other injuries.  There are things you can do to help prevent falling.     Notes:   5/5 - Education to prevent falls sent to member/daughter  5/3 - Daughter report he has completed work with PT, has increased in strength  7/8 - Denies recent falls, continues to exercise and build tolerance  8/4 - Discussed with daughter interventions to prevent falls.  Encouraged to continue with daily exercises to promote strength and endurance  9/6 - Has not had any falls however daughter concerned about risk.  Has appointment today with vascular provider due to lower extremity numbness     [facility] - Track and Manage My Blood Pressure-Hypertension   On track    Timeframe:  Long-Range Goal Priority:  Medium Start Date:    7/8                         Expected End Date:      10/8                 Barriers: Health Behaviors    - check blood pressure daily - write blood pressure results in a log or diary    Why is this important?   You won't feel high blood pressure, but it can still hurt your blood vessels.  High blood pressure can cause heart or kidney problems. It can also cause a stroke.  Making lifestyle changes like losing a little weight or eating less salt will help.  Checking your blood pressure at home and at different times of the day can help to control blood pressure.  If the doctor prescribes medicine remember to take it the way the doctor ordered.  Call the office if you cannot afford the medicine or if there are questions about it.     Notes:   7/8 - Per cardiology  note, blood pressure was slightly elevated.  Member report he has bought BP monitor, has [facility] calendar book to include log.  Will start daily monitoring  8/4 - Daughter report member has been monitoring blood pressure and glucose daily, does not have time to provide specific numbers as she is working  9/6 - Per daughter, blood pressure has been better since change in blood pressure medication last month, not with member currently, unable to provide details       Depression Screen PHQ 2/9 Scores 02/27/2021 09/21/2020  PHQ - 2 Score 0 0    Fall Risk Fall Risk  02/27/2021 02/07/2021 01/05/2021 12/27/2020 12/08/2020  Falls in the past year? 0 0 0 0 0  Number falls in past yr: 0 0 0 0 0  Injury with Fall? 0 0 0 0 0  Risk for fall due to : No Fall Risks No Fall Risks No Fall Risks No Fall Risks No Fall Risks  Follow up Falls evaluation completed Falls evaluation completed Falls evaluation completed Falls evaluation completed Falls evaluation completed    [place]:  Any stairs in or around the home? No  If so, are there any without handrails? No  Home free of loose throw rugs in walkways, pet beds, electrical cords, etc? No  Adequate lighting in your home to reduce risk of falls? Yes   ASSISTIVE DEVICES UTILIZED TO PREVENT FALLS:  Life alert? No  Use  of a cane, walker or w/c? No  Grab bars in the bathroom? Yes  Shower chair or bench in shower? Yes  Elevated toilet seat or a handicapped toilet? No   TIMED UP AND GO:  Was the test performed? No .  Length of time to ambulate 10 feet: N/A  sec.   Gait slow and steady without use of assistive device  Cognitive Function:     6CIT Screen 02/27/2021  What Year? 4 points  What month? 0 points  What time? 3 points  Count back from 20 4 points  Months in reverse 4 points  Repeat phrase 10 points  Total Score 25    Immunizations Immunization History  Administered Date(s) Administered   Fluad Quad(high  Dose 65+) 02/07/2021   PFIZER(Purple Top)SARS-COV-2 Vaccination 08/13/2019, 09/06/2019   Pneumococcal Conjugate-13 01/14/2020   Pneumococcal Polysaccharide-23 02/07/2021    TDAP status: Due, Education has been provided regarding the importance of this vaccine. Advised may receive this vaccine at local pharmacy or Health Dept. Aware to provide a copy of the vaccination record if obtained from local pharmacy or Health Dept. Verbalized acceptance and understanding.  Flu Vaccine status: Up to date  Pneumococcal vaccine status: Due, Education has been provided regarding the importance of this vaccine. Advised may receive this vaccine at local pharmacy or Health Dept. Aware to provide a copy of the vaccination record if obtained from local pharmacy or Health Dept. Verbalized acceptance and understanding.  Covid-19 vaccine status: Information provided on how to obtain vaccines.   Qualifies for Shingles Vaccine? Yes   Zostavax completed No   Shingrix Completed?: No.    Education has been provided regarding the importance of this vaccine. Patient has been advised to call insurance company to determine out of pocket expense if they have not yet received this vaccine. Advised may also receive vaccine at local pharmacy or Health Dept. Verbalized acceptance and understanding.  Screening Tests Health Maintenance  Topic Date Due   TETANUS/TDAP  Never done   Zoster Vaccines- Shingrix (1 of 2) Never done   COVID-19 Vaccine (3 - Pfizer risk series) 10/04/2019   HEMOGLOBIN A1C  06/15/2021   FOOT EXAM  10/06/2021   OPHTHALMOLOGY EXAM  11/27/2021   URINE MICROALBUMIN  02/07/2022   COLONOSCOPY (Pts 45-35yrs Insurance coverage will need to be confirmed)  01/03/2028   INFLUENZA VACCINE  Completed   Hepatitis C Screening  Completed   HPV VACCINES  Aged Out    Health Maintenance  Health Maintenance Due  Topic Date Due   TETANUS/TDAP  Never done   Zoster Vaccines- Shingrix (1 of 2) Never done   COVID-19  Vaccine (3 - Pfizer risk series) 10/04/2019    Colorectal cancer screening: Type of screening: Colonoscopy. Completed 01/02/2018. Repeat every 10 years  Lung Cancer Screening: (Low Dose CT Chest recommended if Age 22-80 years, 30 pack-year currently smoking OR have quit w/in 15years.) does not qualify.   Lung Cancer Screening Referral: No   Additional Screening:  Hepatitis C Screening: does qualify; Completed yes   Vision Screening: Recommended annual ophthalmology exams for early detection of glaucoma and other disorders of the eye. Is the patient up to date with their annual eye exam?  Yes  Who is the provider or what is the name of the office in which the patient attends annual eye exams? [facility]  If pt is not established with a provider, would they like to be referred to a provider to establish care? No .  Dental Screening: Recommended annual dental exams for proper oral hygiene  Community Resource Referral / Chronic Care Management: CRR required this visit?  No   CCM required this visit?  No      Plan:     I have personally reviewed and noted the following in the patient's chart:   Medical and social history Use of alcohol, tobacco or illicit drugs  Current medications and supplements including opioid prescriptions. Patient is currently taking opioid prescriptions. Information provided to patient regarding non-opioid alternatives. Patient advised to discuss non-opioid treatment plan with their provider. Functional ability and status Nutritional status Physical activity Advanced directives List of other physicians Hospitalizations, surgeries, and ER visits in previous 12 months Vitals Screenings to include cognitive, depression, and falls Referrals and appointments  In addition, I have reviewed and discussed with patient certain preventive protocols, quality metrics, and best practice recommendations. A written personalized care plan for preventive  services as well as general preventive health recommendations were provided to patient.     Sandrea Hughs, NP   02/27/2021   Nurse Notes: Advised to get Tetanus ,Zoster and 3rd COVID-19 booster vaccine. Tetanus vaccine script send to pharmacy on previous visit.

## 2021-02-27 NOTE — Telephone Encounter (Signed)
[name], blazejewski are scheduled for a virtual visit with your provider today.    Just as we do with appointments in the office, we must obtain your consent to participate.  Your consent will be active for this visit and any virtual visit you may have with one of our providers in the next 365 days.    If you have a MyChart account, I can also send a copy of this consent to you electronically.  All virtual visits are billed to your insurance company just like a traditional visit in the office.  As this is a virtual visit, video technology does not allow for your provider to perform a traditional examination.  This may limit your provider's ability to fully assess your condition.  If your provider identifies any concerns that need to be evaluated in person or the need to arrange testing such as labs, EKG, etc, we will make arrangements to do so.    Although advances in technology are sophisticated, we cannot ensure that it will always work on either your end or our end.  If the connection with a video visit is poor, we may have to switch to a telephone visit.  With either a video or telephone visit, we are not always able to ensure that we have a secure connection.   I need to obtain your verbal consent now.   Are you willing to proceed with your visit today?   David Burton has provided verbal consent on 02/27/2021 for a virtual visit (video or telephone).   Carroll Kinds, [facility] 02/27/2021  2:44 PM

## 2021-02-27 NOTE — Progress Notes (Signed)
This service is provided via telemedicine  No vital signs collected/recorded due to the encounter was a telemedicine visit.   Location of patient (ex: home, work):  Home  Patient consents to a telephone visit:  Yes, see encounter dated 02/27/2021  Location of the provider (ex: office, home):  [facility]  Name of any referring provider:  N/A  Names of all persons participating in the telemedicine service and their role in the encounter:  Sherrie Mustache, Nurse Practitioner, Carroll Kinds, CMA, and patient.   Time spent on call:  10 minutes with medical assistant

## 2021-03-02 ENCOUNTER — Other Ambulatory Visit: Payer: Self-pay

## 2021-03-02 MED ORDER — AMLODIPINE BESYLATE 10 MG PO TABS: 10.0000 mg | ORAL_TABLET | Freq: Every day | ORAL | 1 refills | Status: DC

## 2021-03-04 ENCOUNTER — Other Ambulatory Visit: Payer: Self-pay | Admitting: Family

## 2021-03-04 DIAGNOSIS — I129 Hypertensive chronic kidney disease with stage 1 through stage 4 chronic kidney disease, or unspecified chronic kidney disease: Secondary | ICD-10-CM

## 2021-03-06 ENCOUNTER — Telehealth: Payer: Self-pay | Admitting: *Deleted

## 2021-03-06 NOTE — Telephone Encounter (Signed)
Patient daughter called wanting to know if we could write a Rx for patient to get a Dexcom Blood Sugar Machine.   I asked daughter if she has called patient's insurance company to see if it was covered and she stated that she has not called yet.   I instructed her to call the insurance company to see what was covered and let us know and we could write a Rx for it.   She stated that she would call and let us know.  Awaiting Callback.

## 2021-03-07 ENCOUNTER — Other Ambulatory Visit: Payer: Self-pay

## 2021-03-07 DIAGNOSIS — G894 Chronic pain syndrome: Secondary | ICD-10-CM

## 2021-03-07 MED ORDER — HYDROCODONE-ACETAMINOPHEN 10-325 MG PO TABS: 1.0000 | ORAL_TABLET | Freq: Three times a day (TID) | ORAL | 0 refills | Status: DC | PRN

## 2021-03-07 NOTE — Telephone Encounter (Signed)
Patient called requesting refill on Hydrocodone/APAP 10/325 mg tablet one three times a day as needed. Current Contract on file.  Medication pended and routed to [facility] Fajardo [name],NP

## 2021-03-09 ENCOUNTER — Other Ambulatory Visit: Payer: Self-pay | Admitting: *Deleted

## 2021-03-09 NOTE — Patient Outreach (Signed)
[facility]) Care Management  [place]  03/10/2021   David Burton Oct 03, 1945 [id number]   Outgoing call placed to daughter, state member is doing well.  Denies any urgent concerns, encouraged to contact this care manager with questions.  Agrees to ongoing follow up for chronic disease management.  Encounter Medications:  Outpatient Encounter Medications as of 03/09/2021  Medication Sig   amLODipine (NORVASC) 10 MG tablet Take 1 tablet (10 mg total) by mouth daily.   atorvastatin (LIPITOR) 20 MG tablet TAKE 1 TABLET(20 MG) BY MOUTH DAILY   BD PEN NEEDLE NANO 2ND GEN 32G X 4 MM MISC USE 1 PEN THREE TIMES DAILY AS DIRECTED   clopidogrel (PLAVIX) 75 MG tablet TAKE 1 TABLET(75 MG) BY MOUTH DAILY   ezetimibe (ZETIA) 10 MG tablet Take 1 tablet (10 mg total) by mouth daily.   glucose blood (ACCU-CHEK AVIVA PLUS) test strip Use to test blood sugar 3 times daily. Dx: E11.42   HYDROcodone-acetaminophen (NORCO) 10-325 MG tablet Take 1 tablet by mouth 3 (three) times daily as needed.   ibuprofen (ADVIL) 600 MG tablet Take 600 mg by mouth every 6 (six) hours as needed.   insulin detemir (LEVEMIR) 100 UNIT/ML FlexPen Inject 22 Units into the skin at bedtime.   insulin lispro (HUMALOG KWIKPEN) 100 UNIT/ML KwikPen Inject 8-10 units into the skin per sliding scale three times daily with meals.   isosorbide mononitrate (IMDUR) 60 MG 24 hr tablet TAKE 1 TABLET(60 MG) BY MOUTH DAILY   magic mouthwash w/lidocaine SOLN Take 2 mLs by mouth 3 (three) times daily as needed for mouth pain.   metoprolol tartrate (LOPRESSOR) 25 MG tablet TAKE 1 TABLET(25 MG) BY MOUTH TWICE DAILY   nitroGLYCERIN (NITROSTAT) 0.4 MG SL tablet Dissolve one tablet under tongue as needed for chest pain.   NOVOLOG FLEXPEN 100 UNIT/ML FlexPen SMARTSIG:8-10 Unit(s) SUB-Q 3 Times Daily (Patient not taking: Reported on 02/27/2021)   REPATHA 140 MG/ML SOSY Inject 140 mg into the skin every 14 (fourteen) days.   No  facility-administered encounter medications on file as of 03/09/2021.    Functional Status:  In your present state of health, do you have any difficulty performing the following activities: 02/27/2021 09/21/2020  Hearing? N -  Vision? N -  Difficulty concentrating or making decisions? N -  Walking or climbing stairs? N -  Dressing or bathing? N -  Doing errands, shopping? Y -  Comment daughter assist with driving -  Conservation officer, nature and eating ? N N  Using the Toilet? Y N  Comment constipation -  In the past six months, have you accidently leaked urine? N N  Do you have problems with loss of bowel control? N N  Managing your Medications? N N  Managing your Finances? N N  Housekeeping or managing your Housekeeping? N Y  Comment - Applying for PCS.  Some recent data might be hidden    Fall/Depression Screening: Fall Risk  02/27/2021 02/07/2021 01/05/2021  Falls in the past year? 0 0 0  Number falls in past yr: 0 0 0  Injury with Fall? 0 0 0  Risk for fall due to : No Fall Risks No Fall Risks No Fall Risks  Follow up Falls evaluation completed Falls evaluation completed Falls evaluation completed   PHQ 2/9 Scores 02/27/2021 09/21/2020  PHQ - 2 Score 0 0    Assessment:   Care Plan There are no care plans that you recently modified to display for this patient.  Goals Addressed             This Visit's Progress    [facility] - Make and Keep All Appointments   On track    Timeframe:  Short-Term Goal Priority:  Medium Start Date:     5/5                      Expected End Date:  10/6   (extended)                  Barriers: Knowledge     - call to cancel if needed - keep a calendar with appointment dates    Why is this important?   Part of staying healthy is seeing the doctor for follow-up care.  If you forget your appointments, there are some things you can do to stay on track.    Notes:   5/5 - Verified with daughter that she would be keeping up with all member's  appointments and providing transportation.  Appointment for diabetic shoes on 5/12  6/3 - Cardiology follow up on 6/14, follow up for diabetic foot exam on 8/8.  Will also have more teeth removed next week, preparing to be fitted for dentures  7/8 - Has more upcoming appointments for dental work to remove remaining teeth.  Confirms completed appointment with cardiology on 6/14, PCP appointment today.  8/4 - Daughter report member has follow up with PCP tomorrow to review medication changes for insulin as well as HTN  9/6 - Appointment with vascular today, PCP tomorrow, and cardiology on 9/15  10/8 - All above appointments completed.  AWV complete, received pneumonia and flu shots.  Daughter report member has appointment with dentist later this month to continue oral extractions and plan for dentures.  Most recent A1C in July was 8.5, daughter working on getting member a new glucose meter where he does not have to stick his finger (has not been consistently monitoring blood sugar currently).  Will have follow up labs on 10/11 and office visit with PCP on 10/14     THN - Prevent Falls and Injury   On track    Timeframe:  Long-Range Goal Priority:  High Start Date:           5/5                  Expected End Date:    11/5  Barriers: Health Behaviors Knowledge                          - always use handrails on the stairs - keep my cell phone with me always - learn how to get back up if I fall - use a nonslip pad with throw rugs, or remove them completely - use a nightlight in the bathroom - wear my glasses and/or hearing aid    Why is this important?   Most falls happen when it is hard for you to walk safely. Your balance may be off because of an illness. You may have pain in your knees, hip or other joints.  You may be overly tired or taking medicines that make you sleepy. You may not be able to see or hear clearly.  Falls can lead to broken bones, bruises or other injuries.  There  are things you can do to help prevent falling.     Notes:   5/5 - Education to prevent falls sent to  member/daughter  5/3 - Daughter report he has completed work with PT, has increased in strength  7/8 - Denies recent falls, continues to exercise and build tolerance  8/4 - Discussed with daughter interventions to prevent falls.  Encouraged to continue with daily exercises to promote strength and endurance  9/6 - Has not had any falls however daughter concerned about risk.  Has appointment today with vascular provider due to lower extremity numbness  10/7 - No recurrent falls.  Visit with vascular completed, no further issues with numbness     COMPLETED: THN - Track and Manage My Blood Pressure-Hypertension   On track    Timeframe:  Long-Range Goal Priority:  Medium Start Date:    7/8                         Expected End Date:      10/8                 Barriers: Health Behaviors    - check blood pressure daily - write blood pressure results in a log or diary    Why is this important?   You won't feel high blood pressure, but it can still hurt your blood vessels.  High blood pressure can cause heart or kidney problems. It can also cause a stroke.  Making lifestyle changes like losing a little weight or eating less salt will help.  Checking your blood pressure at home and at different times of the day can help to control blood pressure.  If the doctor prescribes medicine remember to take it the way the doctor ordered.  Call the office if you cannot afford the medicine or if there are questions about it.     Notes:   7/8 - Per cardiology note, blood pressure was slightly elevated.  Member report he has bought BP monitor, has THN calendar book to include log.  Will start daily monitoring  8/4 - Daughter report member has been monitoring blood pressure and glucose daily, does not have time to provide specific numbers as she is working  9/6 - Per daughter, blood pressure has been  better since change in blood pressure medication last month, not with member currently, unable to provide details  10/7 - Blood pressure has remained stable per daughter, no changes in medications         Plan:  Follow-up: Patient agrees to Care Plan and Follow-up.  Will place referral to health coach.  [name], [place], MSN [place] [phone]

## 2021-03-11 ENCOUNTER — Other Ambulatory Visit: Payer: Self-pay | Admitting: Cardiology

## 2021-03-12 ENCOUNTER — Other Ambulatory Visit: Payer: Self-pay | Admitting: *Deleted

## 2021-03-13 ENCOUNTER — Other Ambulatory Visit: Payer: Self-pay

## 2021-03-13 ENCOUNTER — Other Ambulatory Visit: Payer: Medicare Other

## 2021-03-13 DIAGNOSIS — E039 Hypothyroidism, unspecified: Secondary | ICD-10-CM | POA: Diagnosis not present

## 2021-03-13 MED ORDER — DEXCOM G6 SENSOR MISC: 3 refills | Status: AC

## 2021-03-13 MED ORDER — DEXCOM G6 TRANSMITTER MISC: 0 refills | Status: AC

## 2021-03-13 NOTE — Telephone Encounter (Signed)
Patient daughter called back and stated that she spoke with [employer] and they will cover the [facility] and stated to fax it to [place] Fax: [phone]  Printed Rx and placed in Dinah's folder to review and sign.

## 2021-03-13 NOTE — Addendum Note (Signed)
Addended by: Rafael Bihari A on: 03/13/2021 09:51 AM   Modules accepted: Orders

## 2021-03-13 NOTE — Telephone Encounter (Signed)
Noted  

## 2021-03-13 NOTE — Telephone Encounter (Signed)
Rx Signed and Faxed to [employer].

## 2021-03-14 LAB — T3: T3, Total: 113 ng/dL (ref 76–181)

## 2021-03-14 LAB — T4: T4, Total: 6.5 ug/dL (ref 4.9–10.5)

## 2021-03-16 ENCOUNTER — Ambulatory Visit (INDEPENDENT_AMBULATORY_CARE_PROVIDER_SITE_OTHER): Payer: Medicare Other | Admitting: Family

## 2021-03-16 ENCOUNTER — Other Ambulatory Visit: Payer: Self-pay

## 2021-03-16 ENCOUNTER — Encounter: Payer: Self-pay | Admitting: Family

## 2021-03-16 ENCOUNTER — Telehealth: Payer: Self-pay | Admitting: Cardiology

## 2021-03-16 VITALS — BP 118/62 | HR 77 | Temp 97.3°F | Resp 16 | Ht 67.0 in | Wt 258.0 lb

## 2021-03-16 DIAGNOSIS — R809 Proteinuria, unspecified: Secondary | ICD-10-CM

## 2021-03-16 DIAGNOSIS — E7800 Pure hypercholesterolemia, unspecified: Secondary | ICD-10-CM

## 2021-03-16 DIAGNOSIS — E1142 Type 2 diabetes mellitus with diabetic polyneuropathy: Secondary | ICD-10-CM

## 2021-03-16 DIAGNOSIS — Z951 Presence of aortocoronary bypass graft: Secondary | ICD-10-CM

## 2021-03-16 DIAGNOSIS — N183 Chronic kidney disease, stage 3 unspecified: Secondary | ICD-10-CM | POA: Diagnosis not present

## 2021-03-16 DIAGNOSIS — E039 Hypothyroidism, unspecified: Secondary | ICD-10-CM | POA: Diagnosis not present

## 2021-03-16 DIAGNOSIS — I129 Hypertensive chronic kidney disease with stage 1 through stage 4 chronic kidney disease, or unspecified chronic kidney disease: Secondary | ICD-10-CM | POA: Diagnosis not present

## 2021-03-16 DIAGNOSIS — Z8673 Personal history of transient ischemic attack (TIA), and cerebral infarction without residual deficits: Secondary | ICD-10-CM

## 2021-03-16 DIAGNOSIS — R351 Nocturia: Secondary | ICD-10-CM | POA: Diagnosis not present

## 2021-03-16 DIAGNOSIS — I2510 Atherosclerotic heart disease of native coronary artery without angina pectoris: Secondary | ICD-10-CM

## 2021-03-16 DIAGNOSIS — E1129 Type 2 diabetes mellitus with other diabetic kidney complication: Secondary | ICD-10-CM | POA: Diagnosis not present

## 2021-03-16 MED ORDER — LOSARTAN POTASSIUM 25 MG PO TABS: 25.0000 mg | ORAL_TABLET | Freq: Every day | ORAL | 1 refills | Status: DC

## 2021-03-16 MED ORDER — REPATHA 140 MG/ML ~~LOC~~ SOSY: 140.0000 mg | PREFILLED_SYRINGE | SUBCUTANEOUS | 3 refills | Status: DC

## 2021-03-16 NOTE — Telephone Encounter (Signed)
Ok done

## 2021-03-16 NOTE — Telephone Encounter (Signed)
Patient requesting refill for repatha

## 2021-03-16 NOTE — Progress Notes (Signed)
Provider: Marlowe Sax FNP-C   Shiloh Southern, [name], NP  Patient Care Team: Brenten Janney, [name], NP as PCP - General (Family Medicine) Pleasant, Eppie Gibson, RN as [place] Management  Extended Emergency Contact Information Primary Emergency Contact: [place], [place] Mobile Phone: [phone] Relation: Daughter  Code Status:  DNR Goals of care: Advanced Directive information Advanced Directives 03/16/2021  Does Patient Have a Medical Advance Directive? Yes  Type of Advance Directive Out of facility DNR (pink MOST or yellow form)  Does patient want to make changes to medical advance directive? No - Patient declined  Copy of [place] in Chart? -  Would patient like information on creating a medical advance directive? -  Pre-existing out of facility DNR order (yellow form or pink MOST form) -     Chief Complaint  Patient presents with   Medical Management of Chronic Issues    6 month follow up.    Immunizations    Discuss the need for Tetanus vaccine, Shingrix vaccine, and Covid Booster.    HPI:  Pt is a 75 y.o. male seen today for medical management of chronic diseases. Has a medical history of Hypertension with chronic kidney disease stage 3,Type 2 Diabetes, CAD status post CABG x 4 ,Hyperlipidemia,Diastolic Dysfunction,GERD,microalbuminuria due to Type 2 DM,hx of stroke,Alcohol use,Morbid Obesity among other conditions.  States glucometer broke unable to check his blood sugar. Request Dexi Comb order to help check blood sugars up to four times per day. Has had no recent hospital admission or fall episode. Has gain one pound over one month.  States not drinking any beer.Trying to get back to exercise by walking.   Gets up 2-3 times at night.drinks some water.weak streams is weaker at the end but strong.  He was advised on previous visit while with daughter to get Tetanus vaccine,shingles vaccine and COVID-19 booster vaccine at his  pharmacy   Past Medical History:  Diagnosis Date   Bell palsy    CKD (chronic kidney disease), stage III ([place]) 12/16/2018   Coronary artery disease involving native coronary artery of native heart with unstable angina pectoris ([place]) 12/16/2018   GERD (gastroesophageal reflux disease)    Hypertension    Hypertension with heart disease 12/16/2018   Myocardial infarction ([place])    Pure hypercholesterolemia 12/16/2018   Stroke ([place])    Type 2 diabetes, controlled, with peripheral neuropathy ([place]) 12/16/2018   Past Surgical History:  Procedure Laterality Date   CARDIAC SURGERY     COLONOSCOPY WITH PROPOFOL N/A 01/02/2018   Procedure: COLONOSCOPY WITH PROPOFOL;  Surgeon: Carol Ada, MD;  Location: [facility];  Service: Endoscopy;  Laterality: N/A;   CORONARY STENT INTERVENTION N/A 01/26/2019   Procedure: CORONARY STENT INTERVENTION;  Surgeon: Adrian Prows, MD;  Location: [place] [facility];  Service: Cardiovascular;  Laterality: N/A;   LEFT HEART CATH AND CORS/GRAFTS ANGIOGRAPHY N/A 01/05/2019   Procedure: LEFT HEART CATH AND CORS/GRAFTS ANGIOGRAPHY;  Surgeon: Adrian Prows, MD;  Location: [place] [facility];  Service: Cardiovascular;  Laterality: N/A;   POLYPECTOMY  01/02/2018   Procedure: POLYPECTOMY;  Surgeon: Carol Ada, MD;  Location: [facility];  Service: Endoscopy;;   stents      No Known Allergies  Allergies as of 03/16/2021   No Known Allergies      Medication List        Accurate as of March 16, 2021  2:32 PM. If you have any questions, ask your nurse or doctor.  Accu-Chek Aviva Plus test strip Generic drug: glucose blood Use to test blood sugar 3 times daily. Dx: E11.42   amLODipine 10 MG tablet Commonly known as: NORVASC Take 1 tablet (10 mg total) by mouth daily.   atorvastatin 20 MG tablet Commonly known as: LIPITOR TAKE 1 TABLET(20 MG) BY MOUTH DAILY   BD Pen Needle Nano 2nd Gen 32G X 4 MM Misc Generic drug: Insulin Pen Needle USE 1 PEN THREE  TIMES DAILY AS DIRECTED   clopidogrel 75 MG tablet Commonly known as: PLAVIX TAKE 1 TABLET(75 MG) BY MOUTH DAILY   Dexcom G6 Sensor Misc Use to test blood sugar once daily. Dx: E11.42   Dexcom G6 Transmitter Misc Use to test blood sugar once daily. Dx: E11.42   ezetimibe 10 MG tablet Commonly known as: ZETIA Take 1 tablet (10 mg total) by mouth daily.   HYDROcodone-acetaminophen 10-325 MG tablet Commonly known as: NORCO Take 1 tablet by mouth 3 (three) times daily as needed.   ibuprofen 600 MG tablet Commonly known as: ADVIL Take 600 mg by mouth every 6 (six) hours as needed.   insulin detemir 100 UNIT/ML FlexPen Commonly known as: LEVEMIR Inject 22 Units into the skin at bedtime.   insulin lispro 100 UNIT/ML KwikPen Commonly known as: HumaLOG KwikPen Inject 8-10 units into the skin per sliding scale three times daily with meals.   isosorbide mononitrate 60 MG 24 hr tablet Commonly known as: IMDUR TAKE 1 TABLET(60 MG) BY MOUTH DAILY   magic mouthwash w/lidocaine Soln Take 2 mLs by mouth 3 (three) times daily as needed for mouth pain.   metoprolol tartrate 25 MG tablet Commonly known as: LOPRESSOR TAKE 1 TABLET(25 MG) BY MOUTH TWICE DAILY   nitroGLYCERIN 0.4 MG SL tablet Commonly known as: NITROSTAT Dissolve one tablet under tongue as needed for chest pain.   NovoLOG FlexPen 100 UNIT/ML FlexPen Generic drug: insulin aspart   Repatha 140 MG/ML Sosy Generic drug: Evolocumab Inject 140 mg into the skin every 14 (fourteen) days.        Review of Systems  Constitutional:  Negative for appetite change, chills, fatigue, fever and unexpected weight change.  HENT:  Negative for congestion, dental problem, ear discharge, ear pain, facial swelling, hearing loss, nosebleeds, postnasal drip, rhinorrhea, sinus pressure, sinus pain, sneezing, sore throat, tinnitus and trouble swallowing.   Eyes:  Negative for pain, discharge, redness, itching and visual disturbance.   Respiratory:  Negative for cough, chest tightness, shortness of breath and wheezing.   Cardiovascular:  Negative for chest pain, palpitations and leg swelling.  Gastrointestinal:  Negative for abdominal distention, abdominal pain, blood in stool, constipation, diarrhea, nausea and vomiting.  Endocrine: Negative for cold intolerance, heat intolerance, polydipsia, polyphagia and polyuria.  Genitourinary:  Negative for difficulty urinating, dysuria, flank pain, frequency and urgency.  Musculoskeletal:  Positive for arthralgias, back pain and gait problem. Negative for joint swelling, myalgias, neck pain and neck stiffness.  Skin:  Negative for color change, pallor, rash and wound.  Neurological:  Positive for numbness. Negative for dizziness, syncope, speech difficulty, weakness, light-headedness and headaches.       Numbness on fingers   Hematological:  Does not bruise/bleed easily.  Psychiatric/Behavioral:  Negative for agitation, behavioral problems, confusion, hallucinations, self-injury, sleep disturbance and suicidal ideas. The patient is not nervous/anxious.    Immunization History  Administered Date(s) Administered   Fluad Quad(high Dose 65+) 02/07/2021   PFIZER(Purple Top)SARS-COV-2 Vaccination 08/13/2019, 09/06/2019   Pneumococcal Conjugate-13 01/14/2020   Pneumococcal Polysaccharide-23 02/07/2021  Pertinent  Health Maintenance Due  Topic Date Due   HEMOGLOBIN A1C  06/15/2021   FOOT EXAM  10/06/2021   OPHTHALMOLOGY EXAM  11/27/2021   URINE MICROALBUMIN  02/07/2022   COLONOSCOPY (Pts 45-65yr Insurance coverage will need to be confirmed)  01/03/2028   INFLUENZA VACCINE  Completed   Fall Risk  03/16/2021 02/27/2021 02/07/2021 01/05/2021 12/27/2020  Falls in the past year? 0 0 0 0 0  Number falls in past yr: 0 0 0 0 0  Injury with Fall? 0 0 0 0 0  Risk for fall due to : _0   Follow up Falls evaluation completed Falls  evaluation completed Falls evaluation completed Falls evaluation completed Falls evaluation completed   Functional Status Survey:    Vitals:   03/16/21 1417  BP: 118/62  Pulse: 77  Resp: 16  Temp: (!) 97.3 F (36.3 C)  SpO2: 97%  Weight: 258 lb (117 kg)  Height: _1  (1.702 m)   Body mass index is 40.41 kg/m. Physical Exam Vitals reviewed.  Constitutional:      General: He is not in acute distress.    Appearance: Normal appearance. He is obese. He is not ill-appearing or diaphoretic.  HENT:     Head: Normocephalic.     Right Ear: Tympanic membrane, ear canal and external ear normal. There is no impacted cerumen.     Left Ear: Tympanic membrane, ear canal and external ear normal. There is no impacted cerumen.     Nose: Nose normal. No congestion or rhinorrhea.     Mouth/Throat:     Mouth: Mucous membranes are moist.     Pharynx: Oropharynx is clear. No oropharyngeal exudate or posterior oropharyngeal erythema.  Eyes:     General: No scleral icterus.       Right eye: No discharge.        Left eye: No discharge.     Extraocular Movements: Extraocular movements intact.     Conjunctiva/sclera: Conjunctivae normal.     Pupils: Pupils are equal, round, and reactive to light.  Neck:     Vascular: No carotid bruit.  Cardiovascular:     Rate and Rhythm: Normal rate and regular rhythm.     Pulses: Normal pulses.     Heart sounds: Normal heart sounds. No murmur heard.   No friction rub. No gallop.  Pulmonary:     Effort: Pulmonary effort is normal. No respiratory distress.     Breath sounds: Normal breath sounds. No wheezing, rhonchi or rales.  Chest:     Chest wall: No tenderness.  Abdominal:     General: Bowel sounds are normal. There is no distension.     Palpations: Abdomen is soft. There is no mass.     Tenderness: There is no abdominal tenderness. There is no right CVA tenderness, left CVA tenderness, guarding or rebound.  Musculoskeletal:        General: No swelling  or tenderness. Normal range of motion.     Cervical back: Normal range of motion. No rigidity or tenderness.     Right lower leg: No edema.     Left lower leg: No edema.  Lymphadenopathy:     Cervical: No cervical adenopathy.  Skin:    General: Skin is warm and dry.     Coloration: Skin is not pale.     Findings: No bruising, erythema, lesion or rash.  Neurological:  Mental Status: He is alert and oriented to person, place, and time.     Cranial Nerves: No cranial nerve deficit.     Sensory: No sensory deficit.     Motor: No weakness.     Coordination: Coordination normal.     Gait: Gait abnormal.  Psychiatric:        Mood and Affect: Mood normal.        Speech: Speech normal.        Behavior: Behavior normal.        Thought Content: Thought content normal.        Judgment: Judgment normal.    Labs reviewed: Recent Labs    09/13/20 1630 09/14/20 0414 09/15/20 0147 12/13/20 1328  NA  --  140 138 140  K 3.7 3.4* 3.7 4.7  CL  --  114* 113* 110  CO2  --  18* 17* 21  GLUCOSE  --  176* 175* 120*  BUN  --  38* 24* 29*  CREATININE  --  1.62* 1.43* 1.77*  CALCIUM  --  8.9 9.1 9.5  MG 2.2 1.9 1.9  --   PHOS  --  1.6* 2.4*  --    Recent Labs    09/11/20 1019 09/14/20 0414 09/15/20 0147 12/13/20 1328  AST _0 ALT _1 45  ALKPHOS 75 53 58  --   BILITOT 0.9 0.6 0.7 0.5  PROT 8.0 6.4* 7.0 6.8  ALBUMIN 3.8 2.9* 3.1*  --    Recent Labs    09/14/20 0414 09/15/20 0147 12/13/20 1328  WBC 8.9 10.5 7.8  NEUTROABS 5.3 6.0 4,618  HGB 12.0* 12.4* 13.6  HCT 33.5* 35.0* 41.3  MCV 88.4 88.2 91.4  PLT 156 178 141   Lab Results  Component Value Date   TSH 4.89 (H) 12/13/2020   Lab Results  Component Value Date   HGBA1C 8.5 (H) 12/13/2020   Lab Results  Component Value Date   CHOL 92 12/13/2020   HDL 43 12/13/2020   LDLCALC 32 12/13/2020   TRIG 90 12/13/2020   CHOLHDL 2.1 12/13/2020    Significant Diagnostic Results in last 30 days:  No results  found.  Assessment/Plan 1. Nocturia more than twice per night Voids 2-3 times during the night Suspect possible prostate related.will refer to urologist if indicated.request PSA to be checked. - PSA  2. Type 2 diabetes, controlled, with peripheral neuropathy (HCC) Lab Results  Component Value Date   HGBA1C 8.5 (H) 12/13/2020  Continue on Humalog,Levemir and Novolog  - continue to follow up with Podiatrist and Ophthalmology  - on Statin and Plavix  - microalbuminuria noted start on ARB  - dietary modification and exercise advised  - Hemoglobin A1c; Future - Lipid panel; Future  3. Benign hypertension with chronic kidney disease, stage III (HCC) B/p well controlled  Continue on Amlodipine,Imdur and metoprolol Adding low dose losartan for microAlbuminuria.  - CBC with Differential/Platelet; Future - CMP with eGFR([facility]); Future - TSH; Future  4. Microalbuminuria due to type 2 diabetes mellitus ([place]) Start on losartan as below. - losartan (COZAAR) 25 MG tablet; Take 1 tablet (25 mg total) by mouth daily.  Dispense: 90 tablet; Refill: 1  5. Pure hypercholesterolemia LDL at goal  - continue on Atorvastatin,ezetimibe and Repatha  - dietary modification and exercise advised  - Lipid panel; Future  6. Primary hypothyroidism Lab Results  Component Value Date   TSH 4.89 (H) 12/13/2020   Continue to monitor  -  TSH; Future  Family/ staff Communication: Reviewed plan of care with patient verbalized understanding.  Labs/tests ordered: None   Next Appointment : 6 months for medical management of chronic issues.Fasting Labs prior to visit.    Sandrea Hughs, NP

## 2021-03-17 LAB — PSA: PSA: 1 ng/mL (ref <=4.00)

## 2021-03-19 ENCOUNTER — Telehealth: Payer: Self-pay | Admitting: *Deleted

## 2021-03-19 NOTE — Telephone Encounter (Signed)
Karna Christmas, daughter, called and stated that patient saw you on Friday for Nocturia. Stated that patient is going 3 or more times a night and its interrupting his sleep.   Daughter is wanting to know what your advise is on this. (Note not completed from 10/14) Stated that she wasn't sure if the Losartan was meant for this or not that you sent to the pharmacy on Friday.   Please Advise.

## 2021-03-19 NOTE — Telephone Encounter (Signed)
Losartan ordered to protect kidneys due to passing protein in the urine from Type 2 DM.  His symptoms could be related to diabetes too he had not been using insulin told provider mailing pharmacy did not send his insulin.  Prostate level was checked yesterday but level was normal.Enlarged prostate usually causing frequent voiding too at night.

## 2021-03-20 ENCOUNTER — Telehealth: Payer: Self-pay | Admitting: *Deleted

## 2021-03-20 NOTE — Telephone Encounter (Signed)
Patient daughter notified and agreed.  Stated that the Dexcom is on its way and she will be able to monitor his blood sugars better.

## 2021-03-20 NOTE — Telephone Encounter (Signed)
Received fax from Dorothy Puffer with [employer] requesting Documentation to support the Dexcom.  Requesting The Detailed Written Order form to be signed for the CGM and Receiver for Dexcom G6.   [employer]  [phone] Fax: [phone]  Printed Last OV note and attached and placed forms in David Burton's folder to review and sign.  To be faxed back once signed.

## 2021-03-22 ENCOUNTER — Telehealth: Payer: Self-pay | Admitting: *Deleted

## 2021-03-22 ENCOUNTER — Other Ambulatory Visit: Payer: Self-pay

## 2021-03-22 DIAGNOSIS — E7800 Pure hypercholesterolemia, unspecified: Secondary | ICD-10-CM

## 2021-03-22 DIAGNOSIS — Z8673 Personal history of transient ischemic attack (TIA), and cerebral infarction without residual deficits: Secondary | ICD-10-CM

## 2021-03-22 DIAGNOSIS — I2510 Atherosclerotic heart disease of native coronary artery without angina pectoris: Secondary | ICD-10-CM

## 2021-03-22 DIAGNOSIS — Z951 Presence of aortocoronary bypass graft: Secondary | ICD-10-CM

## 2021-03-22 MED ORDER — REPATHA 140 MG/ML ~~LOC~~ SOSY: 140.0000 mg | PREFILLED_SYRINGE | SUBCUTANEOUS | 3 refills | Status: DC

## 2021-03-22 NOTE — Telephone Encounter (Signed)
Received Medical Clearance Request from Dr. Royce Macadamia office for Extraction 7 Teeth with Local/Nitrous Anesthesia. Patient on Plavix.   Medical Clearance Request placed in Dinah's Folder to review and sign.   To be faxed back to Fax: [phone]

## 2021-03-23 NOTE — Telephone Encounter (Addendum)
Confirmed forms are not signed and on Dinah's desk. To D. [name], NP-high priority.

## 2021-03-26 NOTE — Telephone Encounter (Signed)
Form for Dexcom signed and given to Anita,May,CMA to fax. Dental clearance ( Plavix) will need to be cleared by Cardiology.

## 2021-03-26 NOTE — Telephone Encounter (Signed)
Form signed and OV note attached and faxed back to [place] Fax: [phone]

## 2021-03-26 NOTE — Telephone Encounter (Signed)
Per Dinah--Needs Cardiology Clearance.  Faxed form back to Dr. Royce Macadamia office with this request.

## 2021-03-28 ENCOUNTER — Other Ambulatory Visit: Payer: Self-pay

## 2021-03-28 DIAGNOSIS — Z8673 Personal history of transient ischemic attack (TIA), and cerebral infarction without residual deficits: Secondary | ICD-10-CM

## 2021-03-28 DIAGNOSIS — I2510 Atherosclerotic heart disease of native coronary artery without angina pectoris: Secondary | ICD-10-CM

## 2021-03-28 DIAGNOSIS — Z951 Presence of aortocoronary bypass graft: Secondary | ICD-10-CM

## 2021-03-28 DIAGNOSIS — E1142 Type 2 diabetes mellitus with diabetic polyneuropathy: Secondary | ICD-10-CM | POA: Diagnosis not present

## 2021-03-28 DIAGNOSIS — E7800 Pure hypercholesterolemia, unspecified: Secondary | ICD-10-CM

## 2021-03-28 MED ORDER — REPATHA 140 MG/ML ~~LOC~~ SOSY: 140.0000 mg | PREFILLED_SYRINGE | SUBCUTANEOUS | 3 refills | Status: DC

## 2021-03-30 ENCOUNTER — Ambulatory Visit (INDEPENDENT_AMBULATORY_CARE_PROVIDER_SITE_OTHER): Payer: Medicare Other | Admitting: Family

## 2021-03-30 ENCOUNTER — Encounter: Payer: Self-pay | Admitting: Family

## 2021-03-30 ENCOUNTER — Other Ambulatory Visit: Payer: Self-pay

## 2021-03-30 VITALS — BP 142/80 | HR 72 | Temp 98.0°F | Resp 18 | Ht 67.0 in | Wt 259.0 lb

## 2021-03-30 DIAGNOSIS — L03811 Cellulitis of head [any part, except face]: Secondary | ICD-10-CM | POA: Diagnosis not present

## 2021-03-30 DIAGNOSIS — L02811 Cutaneous abscess of head [any part, except face]: Secondary | ICD-10-CM

## 2021-03-30 DIAGNOSIS — E1142 Type 2 diabetes mellitus with diabetic polyneuropathy: Secondary | ICD-10-CM | POA: Diagnosis not present

## 2021-03-30 LAB — GLUCOSE, POCT (MANUAL RESULT ENTRY): POC Glucose: 405 mg/dl — AB (ref 70–99)

## 2021-03-30 MED ORDER — DOXYCYCLINE HYCLATE 100 MG PO TABS: 100.0000 mg | ORAL_TABLET | Freq: Two times a day (BID) | ORAL | 0 refills | Status: AC

## 2021-03-30 NOTE — Progress Notes (Signed)
Provider: Marlowe Sax FNP-C  Namira Rosekrans, [name], NP  Patient Care Team: Haydn Hutsell, [name], NP as PCP - General (Family Medicine) [place], Eppie Gibson, RN as [place] Management  Extended Emergency Contact Information Primary Emergency Contact: Edrian, Melucci Mobile Phone: [phone] Relation: Daughter  Code Status:  DNR Goals of care: Advanced Directive information Advanced Directives 03/30/2021  Does Patient Have a Medical Advance Directive? Yes  Type of Advance Directive Out of facility DNR (pink MOST or yellow form)  Does patient want to make changes to medical advance directive? No - Patient declined  Copy of [place] in Chart? -  Would patient like information on creating a medical advance directive? -  Pre-existing out of facility DNR order (yellow form or pink MOST form) -     Chief Complaint  Patient presents with   Acute Visit    Complains of being bitten in the back of head results in some swelling, and dizziness.     HPI:  Pt is a 75 y.o. male seen today for an acute visit for evaluation of bite on the back of the head x 3 days.does not know what bit him.hurt when he lies on the sides.site was swollen. Has had chills and felt like he had a fever when he first felt the bite.Has had no drainage from site.  Has had dizziness since yesterday.Has not been taking his short acting insulin since his glucometer broke down.Dex Com machine ordered.Daughter called during visit was told Dex com glucometer will be delivered tomorrow.    Past Medical History:  Diagnosis Date   Bell palsy    CKD (chronic kidney disease), stage III ([place]) 12/16/2018   Coronary artery disease involving native coronary artery of native heart with unstable angina pectoris ([place]) 12/16/2018   GERD (gastroesophageal reflux disease)    Hypertension    Hypertension with heart disease 12/16/2018   Myocardial infarction ([place])    Pure hypercholesterolemia  12/16/2018   Stroke ([place])    Type 2 diabetes, controlled, with peripheral neuropathy ([place]) 12/16/2018   Past Surgical History:  Procedure Laterality Date   CARDIAC SURGERY     COLONOSCOPY WITH PROPOFOL N/A 01/02/2018   Procedure: COLONOSCOPY WITH PROPOFOL;  Surgeon: Carol Ada, MD;  Location: [facility];  Service: Endoscopy;  Laterality: N/A;   CORONARY STENT INTERVENTION N/A 01/26/2019   Procedure: CORONARY STENT INTERVENTION;  Surgeon: Adrian Prows, MD;  Location: [facility];  Service: Cardiovascular;  Laterality: N/A;   LEFT HEART CATH AND CORS/GRAFTS ANGIOGRAPHY N/A 01/05/2019   Procedure: LEFT HEART CATH AND CORS/GRAFTS ANGIOGRAPHY;  Surgeon: Adrian Prows, MD;  Location: [facility];  Service: Cardiovascular;  Laterality: N/A;   POLYPECTOMY  01/02/2018   Procedure: POLYPECTOMY;  Surgeon: Carol Ada, MD;  Location: [facility];  Service: Endoscopy;;   stents      No Known Allergies  Outpatient Encounter Medications as of 03/30/2021  Medication Sig   amLODipine (NORVASC) 10 MG tablet Take 1 tablet (10 mg total) by mouth daily.   atorvastatin (LIPITOR) 20 MG tablet TAKE 1 TABLET(20 MG) BY MOUTH DAILY   BD PEN NEEDLE NANO 2ND GEN 32G X 4 MM MISC USE 1 PEN THREE TIMES DAILY AS DIRECTED   clopidogrel (PLAVIX) 75 MG tablet TAKE 1 TABLET(75 MG) BY MOUTH DAILY   Continuous Blood Gluc Sensor (DEXCOM G6 SENSOR) MISC Use to test blood sugar once daily. Dx: E11.42   Continuous Blood Gluc Transmit (DEXCOM G6 TRANSMITTER) MISC  Use to test blood sugar once daily. Dx: E11.42   ezetimibe (ZETIA) 10 MG tablet Take 1 tablet (10 mg total) by mouth daily.   glucose blood (ACCU-CHEK AVIVA PLUS) test strip Use to test blood sugar 3 times daily. Dx: E11.42   HYDROcodone-acetaminophen (NORCO) 10-325 MG tablet Take 1 tablet by mouth 3 (three) times daily as needed.   insulin detemir (LEVEMIR) 100 UNIT/ML FlexPen Inject 22 Units into the skin at bedtime.   insulin lispro (HUMALOG KWIKPEN) 100  UNIT/ML KwikPen Inject 8-10 units into the skin per sliding scale three times daily with meals.   isosorbide mononitrate (IMDUR) 60 MG 24 hr tablet TAKE 1 TABLET(60 MG) BY MOUTH DAILY   losartan (COZAAR) 25 MG tablet Take 1 tablet (25 mg total) by mouth daily.   magic mouthwash w/lidocaine SOLN Take 2 mLs by mouth 3 (three) times daily as needed for mouth pain.   metoprolol tartrate (LOPRESSOR) 25 MG tablet TAKE 1 TABLET(25 MG) BY MOUTH TWICE DAILY   nitroGLYCERIN (NITROSTAT) 0.4 MG SL tablet Dissolve one tablet under tongue as needed for chest pain.   NOVOLOG FLEXPEN 100 UNIT/ML FlexPen    REPATHA 140 MG/ML SOSY Inject 140 mg into the skin every 14 (fourteen) days.   No facility-administered encounter medications on file as of 03/30/2021.    Review of Systems  Constitutional:  Negative for appetite change, chills, fatigue, fever and unexpected weight change.  HENT:  Negative for congestion, dental problem, ear discharge, ear pain, facial swelling, hearing loss, nosebleeds, postnasal drip, rhinorrhea, sinus pressure, sinus pain, sneezing, sore throat, tinnitus and trouble swallowing.   Eyes:  Negative for pain, discharge, redness, itching and visual disturbance.  Respiratory:  Negative for cough, chest tightness, shortness of breath and wheezing.   Cardiovascular:  Negative for chest pain, palpitations and leg swelling.  Gastrointestinal:  Negative for abdominal distention, abdominal pain, blood in stool, constipation, diarrhea, nausea and vomiting.  Endocrine: Negative for polydipsia, polyphagia and polyuria.  Genitourinary:  Negative for urgency.  Musculoskeletal:  Negative for arthralgias, back pain, gait problem, joint swelling, myalgias, neck pain and neck stiffness.  Skin:  Negative for color change, pallor, rash and wound.       Left back of the scalp swelling and pain   Neurological:  Negative for dizziness, syncope, speech difficulty, weakness, light-headedness, numbness and  headaches.  Hematological:  Does not bruise/bleed easily.  Psychiatric/Behavioral:  Negative for agitation, behavioral problems, confusion and sleep disturbance. The patient is not nervous/anxious.    Immunization History  Administered Date(s) Administered   Fluad Quad(high Dose 65+) 02/07/2021   PFIZER(Purple Top)SARS-COV-2 Vaccination 08/13/2019, 09/06/2019   Pneumococcal Conjugate-13 01/14/2020   Pneumococcal Polysaccharide-23 02/07/2021   Pertinent  Health Maintenance Due  Topic Date Due   HEMOGLOBIN A1C  06/15/2021   FOOT EXAM  10/06/2021   OPHTHALMOLOGY EXAM  11/27/2021   COLONOSCOPY (Pts 45-82yrs Insurance coverage will need to be confirmed)  01/03/2028   INFLUENZA VACCINE  Completed   Fall Risk 01/05/2021 02/07/2021 02/27/2021 03/16/2021 03/30/2021  Falls in the past year? 0 0 0 0 0  Was there an injury with Fall? 0 0 0 0 0  Fall Risk Category Calculator 0 0 0 0 0  Fall Risk Category Low Low Low Low Low  Patient Fall Risk Level Low fall risk Low fall risk Low fall risk Low fall risk Low fall risk  Patient at Risk for Falls Due to No Fall Risks No Fall Risks No Fall Risks No Fall Risks  No Fall Risks  Fall risk Follow up Falls evaluation completed Falls evaluation completed Falls evaluation completed Falls evaluation completed Falls evaluation completed   Functional Status Survey:    Vitals:   03/30/21 1437  BP: (!) 142/80  Pulse: 72  Resp: 18  Temp: 98 F (36.7 C)  SpO2: 98%  Weight: 259 lb (117.5 kg)  Height: 5\' 7"  (1.702 m)   Body mass index is 40.57 kg/m. Physical Exam Vitals reviewed.  Constitutional:      General: He is not in acute distress.    Appearance: Normal appearance. He is normal weight. He is not ill-appearing or diaphoretic.  HENT:     Head: Normocephalic.     Right Ear: Tympanic membrane, ear canal and external ear normal. There is no impacted cerumen.     Left Ear: Tympanic membrane, ear canal and external ear normal. There is no impacted  cerumen.     Nose: Nose normal. No congestion or rhinorrhea.     Mouth/Throat:     Mouth: Mucous membranes are moist.     Pharynx: Oropharynx is clear. No oropharyngeal exudate or posterior oropharyngeal erythema.  Eyes:     General: No scleral icterus.       Right eye: No discharge.        Left eye: No discharge.     Conjunctiva/sclera: Conjunctivae normal.     Pupils: Pupils are equal, round, and reactive to light.  Neck:     Vascular: No carotid bruit.  Cardiovascular:     Rate and Rhythm: Normal rate and regular rhythm.     Pulses: Normal pulses.     Heart sounds: Normal heart sounds. No murmur heard.   No friction rub. No gallop.  Pulmonary:     Effort: Pulmonary effort is normal. No respiratory distress.     Breath sounds: Normal breath sounds. No wheezing, rhonchi or rales.  Chest:     Chest wall: No tenderness.  Abdominal:     General: Bowel sounds are normal. There is no distension.     Palpations: Abdomen is soft. There is no mass.     Tenderness: There is no abdominal tenderness. There is no right CVA tenderness, left CVA tenderness, guarding or rebound.  Musculoskeletal:        General: No swelling or tenderness. Normal range of motion.     Cervical back: Normal range of motion. No rigidity or tenderness.     Right lower leg: No edema.     Left lower leg: No edema.  Lymphadenopathy:     Cervical: No cervical adenopathy.  Skin:    General: Skin is warm and dry.     Coloration: Skin is not pale.     Findings: No bruising.          Comments: Left occipital pea size swelling with yellowish color,black scab in the middle and surrounding erythema.Tender to touch.   Neurological:     Mental Status: He is alert and oriented to person, place, and time.     Cranial Nerves: No cranial nerve deficit.     Sensory: No sensory deficit.     Motor: No weakness.     Coordination: Coordination normal.     Gait: Gait normal.  Psychiatric:        Mood and Affect: Mood normal.         Speech: Speech normal.        Behavior: Behavior normal.        Thought Content:  Thought content normal.        Judgment: Judgment normal.    Labs reviewed: Recent Labs    09/13/20 1630 09/14/20 0414 09/15/20 0147 12/13/20 1328  NA  --  140 138 140  K 3.7 3.4* 3.7 4.7  CL  --  114* 113* 110  CO2  --  18* 17* 21  GLUCOSE  --  176* 175* 120*  BUN  --  38* 24* 29*  CREATININE  --  1.62* 1.43* 1.77*  CALCIUM  --  8.9 9.1 9.5  MG 2.2 1.9 1.9  --   PHOS  --  1.6* 2.4*  --    Recent Labs    09/11/20 1019 09/14/20 0414 09/15/20 0147 12/13/20 1328  AST 17 19 25 29   ALT 16 21 30  45  ALKPHOS 75 53 58  --   BILITOT 0.9 0.6 0.7 0.5  PROT 8.0 6.4* 7.0 6.8  ALBUMIN 3.8 2.9* 3.1*  --    Recent Labs    09/14/20 0414 09/15/20 0147 12/13/20 1328  WBC 8.9 10.5 7.8  NEUTROABS 5.3 6.0 4,618  HGB 12.0* 12.4* 13.6  HCT 33.5* 35.0* 41.3  MCV 88.4 88.2 91.4  PLT 156 178 141   Lab Results  Component Value Date   TSH 4.89 (H) 12/13/2020   Lab Results  Component Value Date   HGBA1C 8.5 (H) 12/13/2020   Lab Results  Component Value Date   CHOL 92 12/13/2020   HDL 43 12/13/2020   LDLCALC 32 12/13/2020   TRIG 90 12/13/2020   CHOLHDL 2.1 12/13/2020    Significant Diagnostic Results in last 30 days:  No results found.  Assessment/Plan 1. Type 2 diabetes, controlled, with peripheral neuropathy (HCC) No CBG for review  Has not be using insulin due to no glucometer to check blood sugar  DexiCom recent ordered.daughter called company during visit machine will be delivered in the morning. - POC Glucose (CBG) 405 during visit  Advised to use Levemir 22 units at bedtime and Humalog 12 units before meals  Check CBG 4 times daily before meals and at bedtime  Will follow up in 2 weeks advised to bring log or glucometer for review   2. Cellulitis and abscess of head Afebrile  Left occipital pea size swelling with yellowish color,black scab in the middle and surrounding  erythema.Tender to touch. Apply warm compressor to left side of the head with abscess  for 5-10 minutes. - doxycycline (VIBRA-TABS) 100 MG tablet; Take 1 tablet (100 mg total) by mouth 2 (two) times daily for 7 days.  Dispense: 14 tablet; Refill: 0 - Notify provider if symptoms worsen or not resolved.    Family/ staff Communication: Reviewed plan of care with patient and daughter verbalized understanding   Labs/tests ordered: None   Next Appointment: 2 weeks for blood sugar evaluation   [name] Yesica Kemler, NP

## 2021-03-30 NOTE — Patient Instructions (Signed)
Apply warm compressor to left side of the head with abscess  for 5-10 minutes.Notify provider if not resolved after taking antibiotics.

## 2021-04-02 NOTE — Telephone Encounter (Signed)
Called and spoke with daughter [name].  She stated that she needs clarification on how you wants patient to take both of the insulins because she is going to have to help him.   Hasn't been taking blood sugars because they just received the new machine.   Patient is only taking 20 units of the Levemir at this time.   Please Advise the Directions of both insulins through MyChart so she has them typed out for her record.

## 2021-04-06 ENCOUNTER — Other Ambulatory Visit: Payer: Self-pay | Admitting: *Deleted

## 2021-04-06 DIAGNOSIS — G894 Chronic pain syndrome: Secondary | ICD-10-CM

## 2021-04-06 MED ORDER — HYDROCODONE-ACETAMINOPHEN 10-325 MG PO TABS: 1.0000 | ORAL_TABLET | Freq: Three times a day (TID) | ORAL | 0 refills | Status: DC | PRN

## 2021-04-06 NOTE — Telephone Encounter (Signed)
Patient requested refill.  Epic LR: 03/07/2021 Contract on file 06/12/2020 (added note to upcoming appointment to update) Pended Rx and sent to [facility] for approval.

## 2021-04-10 ENCOUNTER — Other Ambulatory Visit: Payer: Self-pay | Admitting: Family

## 2021-04-10 ENCOUNTER — Ambulatory Visit: Payer: Self-pay | Admitting: *Deleted

## 2021-04-10 NOTE — Telephone Encounter (Signed)
Noted  

## 2021-04-10 NOTE — Telephone Encounter (Signed)
Patient was advised on 03/30/2021 to stop Novolog and continue on Humalog 12 units three ties with meals for easily management of insulin. Continue on Levemir.

## 2021-04-10 NOTE — Telephone Encounter (Signed)
Patient has request refill on medication "Novolog". Patient medication wont allow me send because it has warnings. Medication pend and sent to PCP [name], [name], NP . Please Advise.

## 2021-04-11 ENCOUNTER — Other Ambulatory Visit: Payer: Self-pay

## 2021-04-11 ENCOUNTER — Ambulatory Visit (INDEPENDENT_AMBULATORY_CARE_PROVIDER_SITE_OTHER): Payer: Medicare Other | Admitting: Podiatry

## 2021-04-11 ENCOUNTER — Encounter: Payer: Self-pay | Admitting: Podiatry

## 2021-04-11 DIAGNOSIS — Z860100 Personal history of colon polyps, unspecified: Secondary | ICD-10-CM | POA: Insufficient documentation

## 2021-04-11 DIAGNOSIS — R194 Change in bowel habit: Secondary | ICD-10-CM | POA: Insufficient documentation

## 2021-04-11 DIAGNOSIS — R200 Anesthesia of skin: Secondary | ICD-10-CM

## 2021-04-11 DIAGNOSIS — L84 Corns and callosities: Secondary | ICD-10-CM | POA: Diagnosis not present

## 2021-04-11 DIAGNOSIS — E1142 Type 2 diabetes mellitus with diabetic polyneuropathy: Secondary | ICD-10-CM | POA: Diagnosis not present

## 2021-04-11 DIAGNOSIS — K5900 Constipation, unspecified: Secondary | ICD-10-CM | POA: Insufficient documentation

## 2021-04-11 DIAGNOSIS — M79674 Pain in right toe(s): Secondary | ICD-10-CM

## 2021-04-11 DIAGNOSIS — M79675 Pain in left toe(s): Secondary | ICD-10-CM | POA: Diagnosis not present

## 2021-04-11 DIAGNOSIS — B351 Tinea unguium: Secondary | ICD-10-CM

## 2021-04-11 DIAGNOSIS — R1012 Left upper quadrant pain: Secondary | ICD-10-CM | POA: Insufficient documentation

## 2021-04-11 DIAGNOSIS — R0781 Pleurodynia: Secondary | ICD-10-CM | POA: Insufficient documentation

## 2021-04-13 ENCOUNTER — Other Ambulatory Visit: Payer: Self-pay | Admitting: *Deleted

## 2021-04-13 ENCOUNTER — Ambulatory Visit: Payer: Medicare Other | Admitting: Family

## 2021-04-13 NOTE — Patient Outreach (Signed)
[place] [place] [facility]) Care Management  04/13/2021  David Burton 05-30-1946 [id number]   RN Health Coach attempted follow up outreach call to patient.  Patient was unavailable. HIPPA compliance voicemail message left with return callback number.  Plan: RN will call patient again within 30 days.  [place] Care Management [phone]

## 2021-04-15 NOTE — Progress Notes (Signed)
Subjective: David Burton is a 75 y.o. male patient seen today for preventative diabetic foot care. He is seen for painful thick toenails that are difficult to trim. Pain interferes with ambulation. Aggravating factors include wearing enclosed shoe gear. Pain is relieved with periodic professional debridement.  New problems reported today: None.  Patient states their blood glucose was in the 200's mg/dl today.   He had vascular testing done and ABI;s were found to be normal. He continues to have numbness in both feet left >right.  He does endorse back problems.  PCP is [name], [name], NP. Last visit was: 03/30/2021.  No Known Allergies  Objective: Physical Exam  General: Patient is a pleasant 75 y.o. African American male obese in NAD. AAO x 3.   Neurovascular Examination: CFT <3 seconds b/l LE. Faintly palpable pedal pulses b/l. Pedal hair absent. Lower extremity skin temperature gradient within normal limits. Trace edema noted BLE.  Protective sensation intact 5/5 intact bilaterally with 10g monofilament b/l.  Dermatological:  Pedal integument with normal turgor, texture and tone b/l LE. No open wounds b/l. No interdigital macerations b/l. Toenails 1-5 b/l elongated, thickened, discolored with subungual debris. +Tenderness with dorsal palpation of nailplates. Hyperkeratotic lesion(s) submet head 5 left foot.  No erythema, no edema, no drainage, no fluctuance.  Musculoskeletal:  Normal muscle strength 5/5 to all lower extremity muscle groups bilaterally. No pain, crepitus or joint limitation noted with ROM bilateral LE. HAV with bunion deformity noted b/l LE. Pes planus deformity noted bilateral LE.  Assessment: 1. Pain due to onychomycosis of toenails of both feet   2. Callus   3. Numbness of foot   4. Type 2 diabetes, controlled, with peripheral neuropathy ([place])    Plan: Patient was evaluated and treated and all questions answered. Consent given for treatment as described  below: -He has continued numbness in feet b/l left>right. Vascular studies normal. Will follow up with Ortho regarding his back problems which could be the source of his foot numbness. Will also consider Neurology consult in the future. -Continue diabetic foot care principles: inspect feet daily, monitor glucose as recommended by PCP and/or Endocrinologist, and follow prescribed diet per PCP, Endocrinologist and/or dietician. -Mycotic toenails 1-5 bilaterally were debrided in length and girth with sterile nail nippers and dremel without incident. -Callus(es) submet head 5 left foot pared utilizing sterile scalpel blade without complication or incident. Total number debrided =1. -Patient/POA to call should there be question/concern in the interim.  Return in about 3 months (around 07/12/2021).  Marzetta Board, DPM

## 2021-04-16 ENCOUNTER — Ambulatory Visit: Payer: Medicare Other | Admitting: Family

## 2021-04-19 ENCOUNTER — Ambulatory Visit: Payer: Medicare Other | Admitting: Family

## 2021-04-22 ENCOUNTER — Other Ambulatory Visit: Payer: Self-pay | Admitting: Family

## 2021-04-22 DIAGNOSIS — I25110 Atherosclerotic heart disease of native coronary artery with unstable angina pectoris: Secondary | ICD-10-CM

## 2021-04-22 DIAGNOSIS — Z8673 Personal history of transient ischemic attack (TIA), and cerebral infarction without residual deficits: Secondary | ICD-10-CM

## 2021-04-23 ENCOUNTER — Ambulatory Visit: Payer: Medicare Other | Admitting: Cardiology

## 2021-04-28 DIAGNOSIS — E1142 Type 2 diabetes mellitus with diabetic polyneuropathy: Secondary | ICD-10-CM | POA: Diagnosis not present

## 2021-05-04 ENCOUNTER — Other Ambulatory Visit: Payer: Self-pay | Admitting: *Deleted

## 2021-05-04 DIAGNOSIS — G894 Chronic pain syndrome: Secondary | ICD-10-CM

## 2021-05-04 MED ORDER — HYDROCODONE-ACETAMINOPHEN 10-325 MG PO TABS: 1.0000 | ORAL_TABLET | Freq: Three times a day (TID) | ORAL | 0 refills | Status: DC | PRN

## 2021-05-04 NOTE — Telephone Encounter (Signed)
Patient daughter called and requested refill.  Epic LR: 04/06/2021 Contract Date: 06/12/2020 Pended Rx and sent to [facility] for approval ([place] out of office.)

## 2021-05-08 NOTE — Telephone Encounter (Signed)
Sorry I am not sure why you keep getting the refill request. It has been confirmed that pharmacy received it on 12/2.

## 2021-05-10 ENCOUNTER — Other Ambulatory Visit: Payer: Self-pay | Admitting: Cardiology

## 2021-05-10 DIAGNOSIS — Z8673 Personal history of transient ischemic attack (TIA), and cerebral infarction without residual deficits: Secondary | ICD-10-CM

## 2021-05-10 DIAGNOSIS — Z955 Presence of coronary angioplasty implant and graft: Secondary | ICD-10-CM

## 2021-05-16 ENCOUNTER — Other Ambulatory Visit: Payer: Self-pay | Admitting: *Deleted

## 2021-05-16 NOTE — Patient Instructions (Signed)
Visit Information  Thank you for taking time to visit with me today. Please don't hesitate to contact me if I can be of assistance to you before our next scheduled telephone appointment.  Following are the goals we discussed today:   Our next appointment is by telephone on  March 2023  Please call the [facility] at [phone] if you need to cancel or reschedule your appointment.   Please call the Suicide and Crisis Lifeline: 988 if you are experiencing a Mental Health or [place] or need someone to talk to.  Following is a copy of your care plan:  Care Plan : [place] of Care  Updates made by Sher Hellinger, Eppie Gibson, RN since 05/16/2021 12:00 AM     Problem: Knowledge Deficit Related to Diabetes and Care Coordination Needs   Priority: High     Long-Range Goal: Development Plan of Care for Management of Diabetes   Start Date: 05/16/2021  Expected End Date: 06/01/2022  Priority: High  Note:   Current Barriers:  Knowledge Deficits related to plan of care for management of DMII   RNCM Clinical Goal(s):  Patient will verbalize understanding of plan for management of DMII as evidenced by continuation of monitoring blood sugars and adhering to diabetic diet  through collaboration with RN Care manager, provider, and care team.   Interventions: Inter-disciplinary care team collaboration (see longitudinal plan of care) Evaluation of current treatment plan related to  self management and patient's adherence to plan as established by provider    Patient Goals/Self-Care Activities: Take medications as prescribed   Attend all scheduled provider appointments Call pharmacy for medication refills 3-7 days in advance of running out of medications Attend church or other social activities Perform all self care activities independently  Perform IADL's (shopping, preparing meals, housekeeping, managing finances) independently Call provider office for new concerns  or questions  call the Suicide and Crisis Lifeline: 988 if experiencing a Mental Health or [place]  schedule appointment with eye doctor check blood sugar at prescribed times: before meals and at bedtime and when you have symptoms of low or high blood sugar check feet daily for cuts, sores or redness take the blood sugar meter to all doctor visits trim toenails straight across drink 6 to 8 glasses of water each day manage portion size Patient will get dentures first of the year       The patient verbalized understanding of instructions, educational materials, and care plan provided today and agreed to receive a mailed copy of patient instructions, educational materials, and care plan.   Telephone follow up appointment with care management team member scheduled for: The patient has been provided with contact information for the care management team and has been advised to call with any health related questions or concerns.   [place] Care Management [phone]

## 2021-05-16 NOTE — Patient Outreach (Signed)
[facility]) Care Management [place] Note   05/16/2021 Name:  David Burton MRN:  [id number] DOB:  August 21, 1945  Summary: Per patient his fasting blood sugar was 149. Per patient he is taking his his medications as per ordered. Per patient his daughter is his caregiver. He is able to cook for himself. Patient recently last week had all his teeth removed. Awaiting Dentures after the first of the year. Per patient he can afford medication and food. Patient was exercising. Per patient he was exercising, but his feet and legs are swollen . Patient has an appointment with the cardiologist tomorrow.   Recommendations/Changes made from today's visit: Patient will follow up with getting compression hose Patient will follow up with cardiologist regarding swelling in feet and legs RN discussed low sodium diet and monitoring carbohydates    Subjective: David Burton is an 75 y.o. year old male who is a primary patient of [name], [name], NP. The care management team was consulted for assistance with care management and/or care coordination needs.    RN Health Coach completed Telephone Visit today.   Objective:  Medications Reviewed Today     Reviewed by Verlin Grills, RN (Case Manager) on 05/16/21 at 1433  Med List Status: <None>   Medication Order Taking? Sig Documenting Provider Last Dose Status Informant  amLODipine (NORVASC) 10 MG tablet [id number] No Take 1 tablet (10 mg total) by mouth daily. [name], [name], NP Taking Active   atorvastatin (LIPITOR) 20 MG tablet [id number] No TAKE 1 TABLET(20 MG) BY MOUTH DAILY [name], [name], NP Taking Active   BD PEN NEEDLE NANO 2ND GEN 32G X 4 MM MISC [id number] No USE 1 PEN THREE TIMES DAILY AS DIRECTED [name], [name], NP Taking Active Self  clopidogrel (PLAVIX) 75 MG tablet [id number]  TAKE 1 TABLET(75 MG) BY MOUTH DAILY Tolia, Sunit, DO  Active   Continuous Blood Gluc Sensor (DEXCOM G6 SENSOR) MISC [id number] No Use to  test blood sugar once daily. Dx: E11.42 [name], [name], NP Taking Active   Continuous Blood Gluc Transmit (DEXCOM G6 TRANSMITTER) MISC [id number] No Use to test blood sugar once daily. Dx: E11.42 [name], [name], NP Taking Active   ezetimibe (ZETIA) 10 MG tablet [id number] No Take 1 tablet (10 mg total) by mouth daily. [name], MD Taking Active   glucose blood (ACCU-CHEK AVIVA PLUS) test strip [id number] No Use to test blood sugar 3 times daily. Dx: E11.42 [name], [name], NP Taking Active Self  HYDROcodone-acetaminophen (NORCO) 10-325 MG tablet [id number]  Take 1 tablet by mouth 3 (three) times daily as needed. Lauree Chandler, NP  Active   insulin detemir (LEVEMIR) 100 UNIT/ML FlexPen [id number] No Inject 22 Units into the skin at bedtime. [name], [name], NP Taking Active   insulin lispro (HUMALOG KWIKPEN) 100 UNIT/ML KwikPen [id number] No Inject 8-10 units into the skin per sliding scale three times daily with meals. [name], [name], NP Taking Active Self  isosorbide mononitrate (IMDUR) 60 MG 24 hr tablet [id number]  TAKE 1 TABLET(60 MG) BY MOUTH DAILY [name], [name], NP  Active   losartan (COZAAR) 25 MG tablet [id number] No Take 1 tablet (25 mg total) by mouth daily. [name], [name], NP Taking Active   magic mouthwash w/lidocaine SOLN [id number] No Take 2 mLs by mouth 3 (three) times daily as needed for mouth pain. [name], MD Taking Active   methylPREDNISolone (MEDROL) 4 MG tablet [id number]  as directed  orally once for 6 days [provider]  Active   metoprolol tartrate (LOPRESSOR) 25 MG tablet [id number] No TAKE 1 TABLET(25 MG) BY MOUTH TWICE DAILY [name], [name], NP Taking Active   nitroGLYCERIN (NITROSTAT) 0.4 MG SL tablet [id number] No Dissolve one tablet under tongue as needed for chest pain. [name], [name], NP Taking Active Self  NOVOLOG FLEXPEN 100 UNIT/ML FlexPen [id number] No  [provider] Taking Active   REPATHA 140 MG/ML SOSY [id number] No  Inject 140 mg into the skin every 14 (fourteen) days. [name], DO Taking Active   Med List Note (Mares, [name] 03/16/21 1143):               SDOH:  (Social Determinants of Health) assessments and interventions performed:  SDOH Interventions    Flowsheet Row Most Recent Value  SDOH Interventions   Food Insecurity Interventions Intervention Not Indicated  Housing Interventions Intervention Not Indicated  Intimate Partner Violence Interventions Intervention Not Indicated  Transportation Interventions Intervention Not Indicated       Care Plan  Review of patient past medical history, allergies, medications, health status, including review of consultants reports, laboratory and other test data, was performed as part of comprehensive evaluation for care management services.   Care Plan : RN Care Manager Plan of Care  Updates made by Kazi Montoro, Eppie Gibson, RN since 05/16/2021 12:00 AM     Problem: Knowledge Deficit Related to Diabetes and Care Coordination Needs   Priority: High     Long-Range Goal: Development Plan of Care for Management of Diabetes   Start Date: 05/16/2021  Expected End Date: 06/01/2022  Priority: High  Note:   Current Barriers:  Knowledge Deficits related to plan of care for management of DMII   RNCM Clinical Goal(s):  Patient will verbalize understanding of plan for management of DMII as evidenced by continuation of monitoring blood sugars and adhering to diabetic diet  through collaboration with RN Care manager, provider, and care team.   Interventions: Inter-disciplinary care team collaboration (see longitudinal plan of care) Evaluation of current treatment plan related to  self management and patient's adherence to plan as established by provider    Patient Goals/Self-Care Activities: Take medications as prescribed   Attend all scheduled provider appointments Call pharmacy for medication refills 3-7 days in advance of running out of  medications Attend church or other social activities Perform all self care activities independently  Perform IADL's (shopping, preparing meals, housekeeping, managing finances) independently Call provider office for new concerns or questions  call the Suicide and Crisis Lifeline: 988 if experiencing a Mental Health or [place]  schedule appointment with eye doctor check blood sugar at prescribed times: before meals and at bedtime and when you have symptoms of low or high blood sugar check feet daily for cuts, sores or redness take the blood sugar meter to all doctor visits trim toenails straight across drink 6 to 8 glasses of water each day manage portion size Patient will get dentures first of the year        Plan: Telephone follow up appointment with care management team member scheduled for:  March 2023 The patient has been provided with contact information for the care management team and has been advised to call with any health related questions or concerns.  RN provided information on compression hose Patient will follow up with cardiologist on swelling of legs and feet RN sent living well with diabetes booklet  [place]  Care Management [phone]

## 2021-05-17 ENCOUNTER — Encounter: Payer: Self-pay | Admitting: Cardiology

## 2021-05-17 ENCOUNTER — Other Ambulatory Visit: Payer: Self-pay

## 2021-05-17 ENCOUNTER — Ambulatory Visit: Payer: Medicare Other | Admitting: Cardiology

## 2021-05-17 VITALS — BP 142/73 | HR 60 | Resp 16 | Ht 67.0 in | Wt 272.0 lb

## 2021-05-17 DIAGNOSIS — Z8673 Personal history of transient ischemic attack (TIA), and cerebral infarction without residual deficits: Secondary | ICD-10-CM

## 2021-05-17 DIAGNOSIS — Z794 Long term (current) use of insulin: Secondary | ICD-10-CM | POA: Diagnosis not present

## 2021-05-17 DIAGNOSIS — I5033 Acute on chronic diastolic (congestive) heart failure: Secondary | ICD-10-CM | POA: Diagnosis not present

## 2021-05-17 DIAGNOSIS — N1832 Chronic kidney disease, stage 3b: Secondary | ICD-10-CM | POA: Diagnosis not present

## 2021-05-17 DIAGNOSIS — I2510 Atherosclerotic heart disease of native coronary artery without angina pectoris: Secondary | ICD-10-CM

## 2021-05-17 DIAGNOSIS — E66813 Obesity, class 3: Secondary | ICD-10-CM

## 2021-05-17 DIAGNOSIS — E7800 Pure hypercholesterolemia, unspecified: Secondary | ICD-10-CM | POA: Diagnosis not present

## 2021-05-17 DIAGNOSIS — Z955 Presence of coronary angioplasty implant and graft: Secondary | ICD-10-CM

## 2021-05-17 DIAGNOSIS — Z951 Presence of aortocoronary bypass graft: Secondary | ICD-10-CM | POA: Diagnosis not present

## 2021-05-17 DIAGNOSIS — E1159 Type 2 diabetes mellitus with other circulatory complications: Secondary | ICD-10-CM | POA: Diagnosis not present

## 2021-05-17 DIAGNOSIS — Z6841 Body Mass Index (BMI) 40.0 and over, adult: Secondary | ICD-10-CM

## 2021-05-17 DIAGNOSIS — Z87891 Personal history of nicotine dependence: Secondary | ICD-10-CM

## 2021-05-17 MED ORDER — DAPAGLIFLOZIN PROPANEDIOL 10 MG PO TABS: 10.0000 mg | ORAL_TABLET | Freq: Every day | ORAL | 0 refills | Status: DC

## 2021-05-17 NOTE — Progress Notes (Signed)
David Burton Date of Birth: Aug 16, 1945 MRN: [id number] Primary Care Provider:[name], [name], NP Former Cardiology Providers: Dr. Adrian Prows, Jeri Lager, APRN, FNP-C Primary Cardiologist: [name], DO, [facility] (established care 01/11/2020)  Date: 05/17/21 Last Office Visit: 11/14/2020  Chief Complaint  Patient presents with   Follow-up   Edema    HPI  David Burton is a 75 y.o.  male who presents to the office with a chief complaint of " lower extremity swelling" whose past medical history and cardiovascular risk factors include: NSTEMI in Dec 2018, s/p CABG x 4 on 05/08/2017 (LIMA to LAD, SVG to PDA, SVG to Diagonal, SVG to Ramus. Surgery done at [place]), diabetes mellitus type 2 with peripheral neuropathy, hypertension, hypercholesterolemia, CVA (09/2019), advanced age.  Patient is being followed by the practice for management of his CAD and HFpEF.  Patient comes in for sick visit due to lower extremity swelling.  Patient states that his swelling has been going on for the last 3 months, nonprogressive, but not resolved.  Patient states that his swelling is predominantly throughout the day and still present when he wakes up.  Has been taking his medications as prescribed.  He has recently had to have teeth extractions which were complicated by tongue laceration followed by another set of teeth extractions.  Therefore has been consuming TV dinners and chicken noodle soup 3 cans a week.  He has gained approximately 8 pounds in the last office visit.  Besides lower extremity swelling he is not experiencing orthopnea or paroxysmal nocturnal dyspnea.  Patient denies any chest pain at rest or with effort related activities.   FUNCTIONAL STATUS: He walks atleast 0.5 miles per day.    ALLERGIES: No Known Allergies  MEDICATION LIST PRIOR TO VISIT: Current Outpatient Medications on File Prior to Visit  Medication Sig Dispense Refill   atorvastatin (LIPITOR) 20 MG tablet  TAKE 1 TABLET(20 MG) BY MOUTH DAILY 90 tablet 1   BD PEN NEEDLE NANO 2ND GEN 32G X 4 MM MISC USE 1 PEN THREE TIMES DAILY AS DIRECTED 100 each 3   clopidogrel (PLAVIX) 75 MG tablet TAKE 1 TABLET(75 MG) BY MOUTH DAILY 90 tablet 0   Continuous Blood Gluc Sensor (DEXCOM G6 SENSOR) MISC Use to test blood sugar once daily. Dx: E11.42 9 each 3   Continuous Blood Gluc Transmit (DEXCOM G6 TRANSMITTER) MISC Use to test blood sugar once daily. Dx: E11.42 1 each 0   ezetimibe (ZETIA) 10 MG tablet Take 1 tablet (10 mg total) by mouth daily. 30 tablet 0   glucose blood (ACCU-CHEK AVIVA PLUS) test strip Use to test blood sugar 3 times daily. Dx: E11.42 300 each 1   HYDROcodone-acetaminophen (NORCO) 10-325 MG tablet Take 1 tablet by mouth 3 (three) times daily as needed. 90 tablet 0   insulin detemir (LEVEMIR) 100 UNIT/ML FlexPen Inject 22 Units into the skin at bedtime. 30 mL 1   insulin lispro (HUMALOG KWIKPEN) 100 UNIT/ML KwikPen Inject 8-10 units into the skin per sliding scale three times daily with meals. 45 mL 3   isosorbide mononitrate (IMDUR) 60 MG 24 hr tablet TAKE 1 TABLET(60 MG) BY MOUTH DAILY 90 tablet 1   losartan (COZAAR) 25 MG tablet Take 1 tablet (25 mg total) by mouth daily. 90 tablet 1   metoprolol tartrate (LOPRESSOR) 25 MG tablet TAKE 1 TABLET(25 MG) BY MOUTH TWICE DAILY 180 tablet 1   nitroGLYCERIN (NITROSTAT) 0.4 MG SL tablet Dissolve one tablet under tongue as needed  for chest pain. 30 tablet 3   NOVOLOG FLEXPEN 100 UNIT/ML FlexPen      REPATHA 140 MG/ML SOSY Inject 140 mg into the skin every 14 (fourteen) days. 6 mL 3   No current facility-administered medications on file prior to visit.    PAST MEDICAL HISTORY: Past Medical History:  Diagnosis Date   Bell palsy    CKD (chronic kidney disease), stage III ([place]) 12/16/2018   Coronary artery disease involving native coronary artery of native heart with unstable angina pectoris ([place]) 12/16/2018   GERD (gastroesophageal reflux disease)     Hypertension    Hypertension with heart disease 12/16/2018   Myocardial infarction ([place])    Pure hypercholesterolemia 12/16/2018   Stroke ([place])    Type 2 diabetes, controlled, with peripheral neuropathy ([place]) 12/16/2018    PAST SURGICAL HISTORY: Past Surgical History:  Procedure Laterality Date   CARDIAC SURGERY     COLONOSCOPY WITH PROPOFOL N/A 01/02/2018   Procedure: COLONOSCOPY WITH PROPOFOL;  Surgeon: Carol Ada, MD;  Location: [facility];  Service: Endoscopy;  Laterality: N/A;   CORONARY STENT INTERVENTION N/A 01/26/2019   Procedure: CORONARY STENT INTERVENTION;  Surgeon: Adrian Prows, MD;  Location: [place] [facility];  Service: Cardiovascular;  Laterality: N/A;   LEFT HEART CATH AND CORS/GRAFTS ANGIOGRAPHY N/A 01/05/2019   Procedure: LEFT HEART CATH AND CORS/GRAFTS ANGIOGRAPHY;  Surgeon: Adrian Prows, MD;  Location: [place] [facility];  Service: Cardiovascular;  Laterality: N/A;   POLYPECTOMY  01/02/2018   Procedure: POLYPECTOMY;  Surgeon: Carol Ada, MD;  Location: [facility];  Service: Endoscopy;;   stents      FAMILY HISTORY: The patient's family history includes Cancer in his mother; Heart attack in his father.   SOCIAL HISTORY:  The patient  reports that he quit smoking about 17 years ago. His smoking use included cigarettes. He has a 5.00 pack-year smoking history. He has never used smokeless tobacco. He reports that he does not currently use alcohol. He reports that he does not use drugs.  Review of Systems  Constitutional: Negative for chills and fever.  HENT:  Negative for hoarse voice and nosebleeds.   Eyes:  Negative for discharge, double vision and pain.  Cardiovascular:  Positive for dyspnea on exertion (stable) and leg swelling. Negative for chest pain, claudication, near-syncope, orthopnea, palpitations, paroxysmal nocturnal dyspnea and syncope.  Respiratory:  Negative for hemoptysis and shortness of breath.   Musculoskeletal:  Negative for muscle cramps and  myalgias.  Gastrointestinal:  Negative for abdominal pain, constipation, diarrhea, hematemesis, hematochezia, melena, nausea and vomiting.  Neurological:  Positive for focal weakness (left leg weakness (residual from recent CVA) improving. ). Negative for dizziness and light-headedness.   PHYSICAL EXAM: Vitals with BMI 05/17/2021 03/30/2021 03/16/2021  Height 5\' 7"  5\' 7"  5\' 7"   Weight 272 lbs 259 lbs 258 lbs  BMI 42.59 08.14 48.1  Systolic 856 314 970  Diastolic 73 80 62  Pulse 60 72 77   CONSTITUTIONAL: Appears older than stated age, hemodynamically stable, no acute distress  SKIN: Skin is warm and dry. No rash noted. No cyanosis. No pallor. No jaundice HEAD: Normocephalic and atraumatic.  EYES: No scleral icterus MOUTH/THROAT: Moist oral membranes.  NECK: No JVD present. No thyromegaly noted. No carotid bruits  LYMPHATIC: No visible cervical adenopathy.  CHEST Normal respiratory effort. No intercostal retractions  LUNGS: Clear to auscultation bilaterally.  No stridor. No wheezes. No rales.  CARDIOVASCULAR: Regular rate and rhythm, positive S1-S2, no murmurs rubs or gallops appreciated.  ABDOMINAL: Obese, soft, nontender, nondistended, positive bowel sounds in all 4 quadrants no apparent ascites.  EXTREMITIES: 2+ bilateral pitting edema, warm to touch, faint DP and PT pulses. HEMATOLOGIC: No significant bruising NEUROLOGIC: Oriented to person, place, and time. Nonfocal. Normal muscle tone.  PSYCHIATRIC: Normal mood and affect. Normal behavior. Cooperative  RADIOLOGY: MR Brain w/o contrast:  IMPRESSION: 1. Mildly motion degraded exam. 2. Confirmed 17 mm acute/early subacute infarct within the right pons. 3. Background mild chronic small vessel ischemic disease within the cerebral white matter and pons. Tiny chronic lacunar infarct within the left cerebellum. 4. Mild generalized parenchymal atrophy. 5. Mild paranasal sinus mucosal thickening. Trace left mastoid effusion.  CARDIAC  DATABASE: Coronary artery bypass grafting: s/p CABG x 4 on 05/08/2017 (LIMA to LAD, SVG to PDA, SVG to Diagonal, SVG to Ramus.  EKG: 05/17/2021: Sinus bradycardia, 57 bpm, old inferior infarct, nonspecific T wave abnormality.   Echocardiogram: 09/17/2019: LVEF 55 to 60%, no regional wall motion arise, grade 2 diastolic impairment, elevated LVEDP, right ventricular size mildly dilated, biatrial dilatation, trivial MR, aortic valve sclerosis without stenosis.  Stress Testing:  Lexiscan myoview stress test 10/10/2017: 1. Lexiscan stress test was performed. Exercise capacity was not assessed. Stress symptoms included dizziness. Normal blood pressure. The resting electrocardiogram demonstrated normal sinus rhythm, normal resting conduction, no resting arrhythmias and normal rest repolarization. Stress EKG is non diagnostic for ischemia as it is a pharmacologic stress. 2. The overall quality of the study is good. Left ventricular cavity is noted to be normal on the rest and stress studies. Gated SPECT images reveal normal myocardial thickening and wall motion, except mild inferior hypokinesis. The left ventricular ejection fraction was calculated or visually estimated to be 53%. SPECT stress images demonstrate medium size, moderate intensity perfusion defect in basal to apical inferior, inferolateral myocardium. SPECT rest images demonstrate medium size, mild intensity perfusion defect in basal to apical inferior myocardium. This suggests inferior infarct with periinfarct mild ischemia, as well as inferolateral ischemia. (LCx/PDA territory). 3. Intermediate risk study.  Heart Catheterization: Coronary angiogram 01/05/2019: Normal LVEDP, severe native vessel disease.  High-grade proximal RCA 90 to 95% stenosis followed by 70 to 80% stenosis in the in-stent restenotic proximal segment.  Large RCA.  SVG to RCA could not be visualized in spite of multiple catheter attempts, and presumed occluded. Left main  is large and mildly diseased but patent.  LAD is occluded in the midsegment after the origin of D1, which has ostial 90% stenosis.  SVG to D1 is patent.  LIMA to LAD is patent. Ramus intermediate: Large vessel, mild to moderate calcific 80 to 85% stenosis in the proximal segment.  SVG to ramus intermediate is occluded. Circumflex: High-grade 99%/subtotal occlusion in the ostium.  Moderate to large sized vessel.  Small marginals.  Coronary intervention 01/26/2019: Successful PTCA and stenting of the proximal mid and mid to distal RCA with implantation of 2 overlapping 3.0 x 38 mm Promus Synergy DES, stenosis reduced from 90% to 0% with TIMI-3 to TIMI-3 flow.     Recommendation: With medical high-grade ramus intermediate and circumflex stenosis, he has excellent brisk flow there and ischemia was in the inferior wall, however if he still persist to have recurrence of angina pectoris we will schedule him for complex intervention to the left coronary system.  In view of his underlying comorbidity, would recommend medical therapy  Carotid duplex: 09/17/2019: Right Carotid: Velocities in the right ICA are consistent with a 1-39% stenosis.  Left Carotid: Velocities in the  left ICA are consistent with a 1-39% stenosis.  Vertebrals: Left vertebral artery demonstrates antegrade flow. Right vertebral artery was not visualized  14 - day [facility]ulatory Telemetry.  Dominant rhythm normal sinus. Heart rate 59-156 bpm.  Avg HR 85 bpm. No atrial fibrillation, ventricular tachycardia, high grade AV block, pauses (3 seconds or longer). Total ventricular and supraventricular ectopic burden <1%. Two episode of supraventricular tachycardia. Longest and fastest episode was 17 beats at maximum HR of 156 bpm. Patient triggered events: 0.  LABORATORY DATA: CBC Latest Ref Rng & Units 12/13/2020 09/15/2020 09/14/2020  WBC 3.8 - 10.8 Thousand/uL 7.8 10.5 8.9  Hemoglobin 13.2 - 17.1 g/dL 13.6 12.4(L) 12.0(L)   Hematocrit 38.5 - 50.0 % 41.3 35.0(L) 33.5(L)  Platelets 140 - 400 Thousand/uL 141 178 156    CMP Latest Ref Rng & Units 12/13/2020 09/15/2020 09/14/2020  Glucose 65 - 99 mg/dL 120(H) 175(H) 176(H)  BUN 7 - 25 mg/dL 29(H) 24(H) 38(H)  Creatinine 0.70 - 1.28 mg/dL 1.77(H) 1.43(H) 1.62(H)  Sodium 135 - 146 mmol/L 140 138 140  Potassium 3.5 - 5.3 mmol/L 4.7 3.7 3.4(L)  Chloride 98 - 110 mmol/L 110 113(H) 114(H)  CO2 20 - 32 mmol/L 21 17(L) 18(L)  Calcium 8.6 - 10.3 mg/dL 9.5 9.1 8.9  Total Protein 6.1 - 8.1 g/dL 6.8 7.0 6.4(L)  Total Bilirubin 0.2 - 1.2 mg/dL 0.5 0.7 0.6  Alkaline Phos 38 - 126 U/L - 58 53  AST 10 - 35 U/L 29 25 19   ALT 9 - 46 U/L 45 30 21   Last lipids Lab Results  Component Value Date   CHOL 92 12/13/2020   HDL 43 12/13/2020   LDLCALC 32 12/13/2020   TRIG 90 12/13/2020   CHOLHDL 2.1 12/13/2020    Lab Results  Component Value Date   HGBA1C 8.5 (H) 12/13/2020   HGBA1C 8.1 (H) 09/06/2020   HGBA1C 6.6 (H) 05/08/2020   No components found for: NTPROBNP Lab Results  Component Value Date   TSH 4.89 (H) 12/13/2020   TSH 1.63 09/06/2020   TSH 3.02 05/08/2020    Cardiac Panel (last 3 results) No results for input(s): CKTOTAL, CKMB, TROPONINIHS, RELINDX in the last 72 hours.  IMPRESSION:    ICD-10-CM   1. Acute on chronic heart failure with preserved ejection fraction (HFpEF) (HCC)  I50.33 dapagliflozin propanediol (FARXIGA) 10 MG TABS tablet    Basic metabolic panel    Magnesium    Pro b natriuretic peptide (BNP)    2. Coronary artery disease involving native coronary artery of native heart without angina pectoris  I25.10 EKG 12-Lead    3. S/P primary angioplasty with coronary stent  Z95.5     4. S/P CABG x 4  Z95.1     5. History of stroke  Z86.73     6. Pure hypercholesterolemia  E78.00     7. Type 2 diabetes mellitus with other circulatory complication, with long-term current use of insulin (HCC)  E11.59    Z79.4     8. Long-term insulin use  (HCC)  Z79.4     9. Stage 3b chronic kidney disease (HCC)  N18.32     10. Former smoker  Z87.891     17. Class 3 severe obesity due to excess calories with serious comorbidity and body mass index (BMI) of 40.0 to 44.9 in adult [facility])  E66.01    Z68.41        RECOMMENDATIONS: David Burton is a 75 y.o. male whose  past medical history and cardiovascular risk factors include: NSTEMI in Dec 2018, s/p CABG x 4 on 05/08/2017 (LIMA to LAD, SVG to PDA, SVG to Diagonal, SVG to Ramus. Surgery done at [place]), diabetes mellitus type 2 with peripheral neuropathy, hypertension, hypercholesterolemia, CVA (09/2019), advanced age.  Acute on chronic heart failure with preserved ejection fraction (HFpEF) (HCC) Acute exacerbation Check BMP, magnesium, NT proBNP Start Farxiga 10 mg p.o. daily.  Medication voucher provided to the patient. Repeat blood work in 1 week to reevaluate kidney function and electrolytes. Medications reconciled. Patient will need up titration of GDMT.  At the next office visit plan to transition him from losartan to [facility]. Discontinue amlodipine secondary to lower extremity swelling. Educated on the importance of dietary compliance.  I have asked him to reduced/eliminate the consumption of TV dinners and canned soups.  Daily weights, strict I's and O's Has gained 8 pound since last office visit.  Coronary artery disease involving native coronary artery of native heart without angina pectoris /status post PCI /status post CABG x4: Denies angina pectoris No use of sublingual nitroglycerin tablets since last office encounter Medications reconciled EKG nonischemic Continue to monitor  History of stroke Educated on the importance of secondary prevention. Currently on statin therapy, Zetia, PCSK9 inhibitor Educated importance of glycemic control and blood pressure management  Pure hypercholesterolemia Currently on statin therapy, Zetia, PCSK9 inhibitor AST and  ALT within normal limits as of July 2022 Lipid profile from July 2022 reviewed.  LDL 32 mg/dL.  Type 2 diabetes mellitus with other circulatory complication, with long-term current use of insulin ([place]) Educated on importance of glycemic control. Management primary team.  Class 3 severe obesity due to excess calories with serious comorbidity and body mass index (BMI) of 40.0 to 44.9 in adult [facility]) Body mass index is 42.6 kg/m. I reviewed with the patient the importance of diet, regular physical activity/exercise, weight loss.   Patient is educated on increasing physical activity gradually as tolerated.  With the goal of moderate intensity exercise for 30 minutes a day 5 days a week.  FINAL MEDICATION LIST END OF ENCOUNTER: Meds ordered this encounter  Medications   dapagliflozin propanediol (FARXIGA) 10 MG TABS tablet    Sig: Take 1 tablet (10 mg total) by mouth daily before breakfast.    Dispense:  90 tablet    Refill:  0      Current Outpatient Medications:    atorvastatin (LIPITOR) 20 MG tablet, TAKE 1 TABLET(20 MG) BY MOUTH DAILY, Disp: 90 tablet, Rfl: 1   BD PEN NEEDLE NANO 2ND GEN 32G X 4 MM MISC, USE 1 PEN THREE TIMES DAILY AS DIRECTED, Disp: 100 each, Rfl: 3   clopidogrel (PLAVIX) 75 MG tablet, TAKE 1 TABLET(75 MG) BY MOUTH DAILY, Disp: 90 tablet, Rfl: 0   Continuous Blood Gluc Sensor (DEXCOM G6 SENSOR) MISC, Use to test blood sugar once daily. Dx: E11.42, Disp: 9 each, Rfl: 3   Continuous Blood Gluc Transmit (DEXCOM G6 TRANSMITTER) MISC, Use to test blood sugar once daily. Dx: E11.42, Disp: 1 each, Rfl: 0   dapagliflozin propanediol (FARXIGA) 10 MG TABS tablet, Take 1 tablet (10 mg total) by mouth daily before breakfast., Disp: 90 tablet, Rfl: 0   ezetimibe (ZETIA) 10 MG tablet, Take 1 tablet (10 mg total) by mouth daily., Disp: 30 tablet, Rfl: 0   glucose blood (ACCU-CHEK AVIVA PLUS) test strip, Use to test blood sugar 3 times daily. Dx: E11.42, Disp: 300 each, Rfl: 1  HYDROcodone-acetaminophen (NORCO) 10-325 MG tablet, Take 1 tablet by mouth 3 (three) times daily as needed., Disp: 90 tablet, Rfl: 0   insulin detemir (LEVEMIR) 100 UNIT/ML FlexPen, Inject 22 Units into the skin at bedtime., Disp: 30 mL, Rfl: 1   insulin lispro (HUMALOG KWIKPEN) 100 UNIT/ML KwikPen, Inject 8-10 units into the skin per sliding scale three times daily with meals., Disp: 45 mL, Rfl: 3   isosorbide mononitrate (IMDUR) 60 MG 24 hr tablet, TAKE 1 TABLET(60 MG) BY MOUTH DAILY, Disp: 90 tablet, Rfl: 1   losartan (COZAAR) 25 MG tablet, Take 1 tablet (25 mg total) by mouth daily., Disp: 90 tablet, Rfl: 1   metoprolol tartrate (LOPRESSOR) 25 MG tablet, TAKE 1 TABLET(25 MG) BY MOUTH TWICE DAILY, Disp: 180 tablet, Rfl: 1   nitroGLYCERIN (NITROSTAT) 0.4 MG SL tablet, Dissolve one tablet under tongue as needed for chest pain., Disp: 30 tablet, Rfl: 3   NOVOLOG FLEXPEN 100 UNIT/ML FlexPen, , Disp: , Rfl:    REPATHA 140 MG/ML SOSY, Inject 140 mg into the skin every 14 (fourteen) days., Disp: 6 mL, Rfl: 3  Orders Placed This Encounter  Procedures   Basic metabolic panel   Magnesium   Pro b natriuretic peptide (BNP)   EKG 12-Lead    --Continue cardiac medications as reconciled in final medication list. --Return in about 4 weeks (around 06/14/2021) for Follow up, heart failure management.. Or sooner if needed. --Continue follow-up with your primary care physician regarding the management of your other chronic comorbid conditions.  Patient's questions and concerns were addressed to his satisfaction. He voices understanding of the instructions provided during this encounter.   This note was created using a voice recognition software as a result there may be grammatical errors inadvertently enclosed that do not reflect the nature of this encounter. Every attempt is made to correct such errors.  Total time spent: 33 minutes.  [name], [place], [facility]  Pager: [phone] Office: [phone]

## 2021-05-18 DIAGNOSIS — I5033 Acute on chronic diastolic (congestive) heart failure: Secondary | ICD-10-CM | POA: Diagnosis not present

## 2021-05-19 LAB — BASIC METABOLIC PANEL
BUN/Creatinine Ratio: 19 (ref 10–24)
BUN: 27 mg/dL (ref 8–27)
CO2: 20 mmol/L (ref 20–29)
Calcium: 9.2 mg/dL (ref 8.6–10.2)
Chloride: 107 mmol/L — ABNORMAL HIGH (ref 96–106)
Creatinine, Ser: 1.43 mg/dL — ABNORMAL HIGH (ref 0.76–1.27)
Glucose: 171 mg/dL — ABNORMAL HIGH (ref 70–99)
Potassium: 4.9 mmol/L (ref 3.5–5.2)
Sodium: 142 mmol/L (ref 134–144)
eGFR: 51 mL/min/{1.73_m2} — ABNORMAL LOW (ref >59)

## 2021-05-19 LAB — MAGNESIUM: Magnesium: 1.7 mg/dL (ref 1.6–2.3)

## 2021-05-24 ENCOUNTER — Other Ambulatory Visit: Payer: Self-pay | Admitting: Cardiology

## 2021-05-24 DIAGNOSIS — I5033 Acute on chronic diastolic (congestive) heart failure: Secondary | ICD-10-CM

## 2021-05-24 NOTE — Progress Notes (Signed)
Called pt to inform him about his labs and to go back a week after taking farxiga. Pt understood and will go to [facility] Tuesday

## 2021-05-28 DIAGNOSIS — E1142 Type 2 diabetes mellitus with diabetic polyneuropathy: Secondary | ICD-10-CM | POA: Diagnosis not present

## 2021-05-29 DIAGNOSIS — I5033 Acute on chronic diastolic (congestive) heart failure: Secondary | ICD-10-CM | POA: Diagnosis not present

## 2021-05-30 LAB — BASIC METABOLIC PANEL
BUN/Creatinine Ratio: 20 (ref 10–24)
BUN: 38 mg/dL — ABNORMAL HIGH (ref 8–27)
CO2: 18 mmol/L — ABNORMAL LOW (ref 20–29)
Calcium: 9.1 mg/dL (ref 8.6–10.2)
Chloride: 108 mmol/L — ABNORMAL HIGH (ref 96–106)
Creatinine, Ser: 1.86 mg/dL — ABNORMAL HIGH (ref 0.76–1.27)
Glucose: 123 mg/dL — ABNORMAL HIGH (ref 70–99)
Potassium: 4.5 mmol/L (ref 3.5–5.2)
Sodium: 140 mmol/L (ref 134–144)
eGFR: 38 mL/min/{1.73_m2} — ABNORMAL LOW (ref >59)

## 2021-05-30 LAB — PRO B NATRIURETIC PEPTIDE: NT-Pro BNP: 272 pg/mL (ref 0–376)

## 2021-05-30 LAB — MAGNESIUM: Magnesium: 2.3 mg/dL (ref 1.6–2.3)

## 2021-06-01 NOTE — Progress Notes (Signed)
Called pt no answer °

## 2021-06-06 ENCOUNTER — Other Ambulatory Visit: Payer: Self-pay

## 2021-06-06 DIAGNOSIS — G894 Chronic pain syndrome: Secondary | ICD-10-CM

## 2021-06-06 MED ORDER — HYDROCODONE-ACETAMINOPHEN 10-325 MG PO TABS: 1.0000 | ORAL_TABLET | Freq: Three times a day (TID) | ORAL | 0 refills | Status: DC | PRN

## 2021-06-06 NOTE — Progress Notes (Signed)
Called and spoke to pt, pt stated he is taking Advil. Advised pt to stop taking NSAIDs. Pt will agreed to have lab work done 2 days prior to office visit with [place].

## 2021-06-06 NOTE — Telephone Encounter (Signed)
Last refill 05/04/2021 by Joelene Millin, NP 09/14/2021 with [facility] is next appointment.

## 2021-06-07 ENCOUNTER — Other Ambulatory Visit: Payer: Self-pay

## 2021-06-07 DIAGNOSIS — I5033 Acute on chronic diastolic (congestive) heart failure: Secondary | ICD-10-CM

## 2021-06-14 ENCOUNTER — Other Ambulatory Visit: Payer: Self-pay

## 2021-06-14 ENCOUNTER — Other Ambulatory Visit: Payer: Self-pay | Admitting: Cardiology

## 2021-06-14 DIAGNOSIS — I69354 Hemiplegia and hemiparesis following cerebral infarction affecting left non-dominant side: Secondary | ICD-10-CM | POA: Diagnosis not present

## 2021-06-14 DIAGNOSIS — K219 Gastro-esophageal reflux disease without esophagitis: Secondary | ICD-10-CM | POA: Diagnosis not present

## 2021-06-14 DIAGNOSIS — E1129 Type 2 diabetes mellitus with other diabetic kidney complication: Secondary | ICD-10-CM | POA: Diagnosis not present

## 2021-06-14 DIAGNOSIS — G510 Bell's palsy: Secondary | ICD-10-CM | POA: Diagnosis not present

## 2021-06-14 DIAGNOSIS — N183 Chronic kidney disease, stage 3 unspecified: Secondary | ICD-10-CM | POA: Diagnosis not present

## 2021-06-14 DIAGNOSIS — Z0000 Encounter for general adult medical examination without abnormal findings: Secondary | ICD-10-CM | POA: Diagnosis not present

## 2021-06-14 DIAGNOSIS — R809 Proteinuria, unspecified: Secondary | ICD-10-CM | POA: Diagnosis not present

## 2021-06-14 DIAGNOSIS — E1142 Type 2 diabetes mellitus with diabetic polyneuropathy: Secondary | ICD-10-CM | POA: Diagnosis not present

## 2021-06-14 DIAGNOSIS — Z794 Long term (current) use of insulin: Secondary | ICD-10-CM | POA: Diagnosis not present

## 2021-06-14 DIAGNOSIS — I2510 Atherosclerotic heart disease of native coronary artery without angina pectoris: Secondary | ICD-10-CM | POA: Diagnosis not present

## 2021-06-14 MED ORDER — EZETIMIBE 10 MG PO TABS: 10.0000 mg | ORAL_TABLET | Freq: Every day | ORAL | 1 refills | Status: DC

## 2021-06-15 DIAGNOSIS — R69 Illness, unspecified: Secondary | ICD-10-CM | POA: Diagnosis not present

## 2021-06-16 LAB — PRO B NATRIURETIC PEPTIDE: NT-Pro BNP: 445 pg/mL — ABNORMAL HIGH (ref 0–376)

## 2021-06-19 ENCOUNTER — Ambulatory Visit: Payer: Medicare Other | Admitting: Cardiology

## 2021-06-19 DIAGNOSIS — R69 Illness, unspecified: Secondary | ICD-10-CM | POA: Diagnosis not present

## 2021-06-19 LAB — BASIC METABOLIC PANEL
BUN/Creatinine Ratio: 19 (ref 10–24)
BUN: 30 mg/dL — ABNORMAL HIGH (ref 8–27)
CO2: 14 mmol/L — ABNORMAL LOW (ref 20–29)
Calcium: 10 mg/dL (ref 8.6–10.2)
Chloride: 106 mmol/L (ref 96–106)
Creatinine, Ser: 1.59 mg/dL — ABNORMAL HIGH (ref 0.76–1.27)
Glucose: 130 mg/dL — ABNORMAL HIGH (ref 70–99)
Potassium: 4.8 mmol/L (ref 3.5–5.2)
Sodium: 141 mmol/L (ref 134–144)
eGFR: 45 mL/min/{1.73_m2} — ABNORMAL LOW (ref >59)

## 2021-06-19 LAB — SPECIMEN STATUS REPORT

## 2021-06-21 ENCOUNTER — Other Ambulatory Visit: Payer: Self-pay | Admitting: Family

## 2021-06-21 NOTE — Telephone Encounter (Signed)
Patient has request refill on medication "Atorvastatin 20mg ". Patient medication has Warnings. Medication pend and sent to PCP [name], [name], NP .

## 2021-06-29 NOTE — Progress Notes (Signed)
Spoke to patient he is aware to restart medication and appt has been set

## 2021-07-05 ENCOUNTER — Ambulatory Visit: Payer: Medicare Other | Admitting: Cardiology

## 2021-07-05 ENCOUNTER — Other Ambulatory Visit: Payer: Self-pay

## 2021-07-05 ENCOUNTER — Encounter: Payer: Self-pay | Admitting: Cardiology

## 2021-07-05 VITALS — BP 136/69 | HR 59 | Temp 97.8°F | Resp 16 | Ht 67.0 in | Wt 262.0 lb

## 2021-07-05 DIAGNOSIS — Z8673 Personal history of transient ischemic attack (TIA), and cerebral infarction without residual deficits: Secondary | ICD-10-CM

## 2021-07-05 DIAGNOSIS — I2510 Atherosclerotic heart disease of native coronary artery without angina pectoris: Secondary | ICD-10-CM

## 2021-07-05 DIAGNOSIS — Z955 Presence of coronary angioplasty implant and graft: Secondary | ICD-10-CM

## 2021-07-05 DIAGNOSIS — N1832 Chronic kidney disease, stage 3b: Secondary | ICD-10-CM | POA: Diagnosis not present

## 2021-07-05 DIAGNOSIS — Z87891 Personal history of nicotine dependence: Secondary | ICD-10-CM

## 2021-07-05 DIAGNOSIS — Z794 Long term (current) use of insulin: Secondary | ICD-10-CM

## 2021-07-05 DIAGNOSIS — Z951 Presence of aortocoronary bypass graft: Secondary | ICD-10-CM

## 2021-07-05 DIAGNOSIS — E1159 Type 2 diabetes mellitus with other circulatory complications: Secondary | ICD-10-CM

## 2021-07-05 DIAGNOSIS — I5032 Chronic diastolic (congestive) heart failure: Secondary | ICD-10-CM

## 2021-07-05 NOTE — Progress Notes (Signed)
David Burton Date of Birth: 1946/01/14 MRN: [id number] Primary Care Provider:[name], [name], NP Former Cardiology Providers: Dr. Adrian Prows, Jeri Lager, APRN, FNP-C Primary Cardiologist: [name], DO, [facility] (established care 01/11/2020)  Date: 07/05/21 Last Office Visit: 05/17/2021  Chief Complaint  Patient presents with    heart failure management   Follow-up    HPI  David Burton is a 76 y.o.  male who presents to the office with a chief complaint of " heart failure follow up. "  His past medical history and cardiovascular risk factors include: Chronic HFpEF, NSTEMI in Dec 2018, s/p CABG x 4 on 05/08/2017 (LIMA to LAD, SVG to PDA, SVG to Diagonal, SVG to Ramus. Surgery done at [place]), diabetes mellitus type 2 with peripheral neuropathy, hypertension, hypercholesterolemia, CVA (09/2019), advanced age.  Patient is being followed by the practice for management of his CAD and HFpEF.  Patient presented for sick visit in December 2022 complaining of lower extremity swelling.  At that time he was recommended on the importance of lifestyle changes and increasing pharmacological therapy.  Since last office visit he is no longer consuming TV dinners or canned chicken noodle soups.  We have also discontinued amlodipine and started on Farxiga given his HFpEF.  A week after starting [place]ade he was noted to have acute kidney injury likely due to excessive use of NSAIDs.  He was requested to discontinue NSAIDs and follow-up blood work from 05/29/2021 notes improvement in renal function.  At that time he was asked to restart [place] and repeat blood work a week later.  Repeat blood work is still pending and will be done later next week according to the patient.  Since last office visit he is lost 10 pounds for which he is congratulated for today's visit.  He is also motivated to implement lifestyle changes to facilitate additional weight loss.  Patient states that he is  involvement to the [facility] and exercises several times a week.  He denies anginal discomfort or heart failure symptoms.  FUNCTIONAL STATUS: He walks atleast 0.5 miles per day.    ALLERGIES: No Known Allergies  MEDICATION LIST PRIOR TO VISIT: Current Outpatient Medications on File Prior to Visit  Medication Sig Dispense Refill   atorvastatin (LIPITOR) 20 MG tablet TAKE 1 TABLET(20 MG) BY MOUTH DAILY 90 tablet 1   BD PEN NEEDLE NANO 2ND GEN 32G X 4 MM MISC USE 1 PEN THREE TIMES DAILY AS DIRECTED 100 each 3   clopidogrel (PLAVIX) 75 MG tablet TAKE 1 TABLET(75 MG) BY MOUTH DAILY 90 tablet 0   Continuous Blood Gluc Sensor (DEXCOM G6 SENSOR) MISC Use to test blood sugar once daily. Dx: E11.42 9 each 3   Continuous Blood Gluc Transmit (DEXCOM G6 TRANSMITTER) MISC Use to test blood sugar once daily. Dx: E11.42 1 each 0   dapagliflozin propanediol (FARXIGA) 10 MG TABS tablet Take 1 tablet (10 mg total) by mouth daily before breakfast. 90 tablet 0   ezetimibe (ZETIA) 10 MG tablet TAKE 1 TABLET(10 MG) BY MOUTH DAILY 90 tablet 3   glucose blood (ACCU-CHEK AVIVA PLUS) test strip Use to test blood sugar 3 times daily. Dx: E11.42 300 each 1   HYDROcodone-acetaminophen (NORCO) 10-325 MG tablet Take 1 tablet by mouth 3 (three) times daily as needed. 90 tablet 0   insulin detemir (LEVEMIR) 100 UNIT/ML FlexPen Inject 22 Units into the skin at bedtime. 30 mL 1   insulin lispro (HUMALOG KWIKPEN) 100 UNIT/ML KwikPen Inject 8-10 units  into the skin per sliding scale three times daily with meals. 45 mL 3   isosorbide mononitrate (IMDUR) 60 MG 24 hr tablet TAKE 1 TABLET(60 MG) BY MOUTH DAILY 90 tablet 1   losartan (COZAAR) 25 MG tablet Take 1 tablet (25 mg total) by mouth daily. 90 tablet 1   metoprolol tartrate (LOPRESSOR) 25 MG tablet TAKE 1 TABLET(25 MG) BY MOUTH TWICE DAILY 180 tablet 1   nitroGLYCERIN (NITROSTAT) 0.4 MG SL tablet Dissolve one tablet under tongue as needed for chest pain. 30 tablet 3   NOVOLOG  FLEXPEN 100 UNIT/ML FlexPen      REPATHA 140 MG/ML SOSY Inject 140 mg into the skin every 14 (fourteen) days. 6 mL 3   No current facility-administered medications on file prior to visit.    PAST MEDICAL HISTORY: Past Medical History:  Diagnosis Date   Bell palsy    CKD (chronic kidney disease), stage III ([place]) 12/16/2018   Coronary artery disease involving native coronary artery of native heart with unstable angina pectoris ([place]) 12/16/2018   GERD (gastroesophageal reflux disease)    Hypertension    Hypertension with heart disease 12/16/2018   Myocardial infarction ([place])    Pure hypercholesterolemia 12/16/2018   Stroke ([place])    Type 2 diabetes, controlled, with peripheral neuropathy ([place]) 12/16/2018    PAST SURGICAL HISTORY: Past Surgical History:  Procedure Laterality Date   CARDIAC SURGERY     COLONOSCOPY WITH PROPOFOL N/A 01/02/2018   Procedure: COLONOSCOPY WITH PROPOFOL;  Surgeon: Carol Ada, MD;  Location: [facility];  Service: Endoscopy;  Laterality: N/A;   CORONARY STENT INTERVENTION N/A 01/26/2019   Procedure: CORONARY STENT INTERVENTION;  Surgeon: Adrian Prows, MD;  Location: [place] [facility];  Service: Cardiovascular;  Laterality: N/A;   LEFT HEART CATH AND CORS/GRAFTS ANGIOGRAPHY N/A 01/05/2019   Procedure: LEFT HEART CATH AND CORS/GRAFTS ANGIOGRAPHY;  Surgeon: Adrian Prows, MD;  Location: [place] [facility];  Service: Cardiovascular;  Laterality: N/A;   POLYPECTOMY  01/02/2018   Procedure: POLYPECTOMY;  Surgeon: Carol Ada, MD;  Location: [facility];  Service: Endoscopy;;   stents      FAMILY HISTORY: The patient's family history includes Cancer in his mother; Heart attack in his father.   SOCIAL HISTORY:  The patient  reports that he quit smoking about 18 years ago. His smoking use included cigarettes. He has a 5.00 pack-year smoking history. He has never used smokeless tobacco. He reports that he does not currently use alcohol. He reports that he does not use  drugs.  Review of Systems  Constitutional: Negative for chills and fever.  HENT:  Negative for hoarse voice and nosebleeds.   Eyes:  Negative for discharge, double vision and pain.  Cardiovascular:  Positive for dyspnea on exertion (stable). Negative for chest pain, claudication, leg swelling, near-syncope, orthopnea, palpitations, paroxysmal nocturnal dyspnea and syncope.  Respiratory:  Negative for hemoptysis and shortness of breath.   Musculoskeletal:  Negative for muscle cramps and myalgias.  Gastrointestinal:  Negative for abdominal pain, constipation, diarrhea, hematemesis, hematochezia, melena, nausea and vomiting.  Neurological:  Positive for focal weakness (left leg weakness (residual from recent CVA) improving. ). Negative for dizziness and light-headedness.   PHYSICAL EXAM: Vitals with BMI 07/05/2021 05/17/2021 03/30/2021  Height 5\' 7"  5\' 7"  5\' 7"   Weight 262 lbs 272 lbs 259 lbs  BMI 41.03 43.32 95.18  Systolic 841 660 630  Diastolic 69 73 80  Pulse 59 60 72   CONSTITUTIONAL: Appears older than stated age,  hemodynamically stable, no acute distress  SKIN: Skin is warm and dry. No rash noted. No cyanosis. No pallor. No jaundice HEAD: Normocephalic and atraumatic.  EYES: No scleral icterus MOUTH/THROAT: Moist oral membranes.  NECK: No JVD present. No thyromegaly noted. No carotid bruits  LYMPHATIC: No visible cervical adenopathy.  CHEST Normal respiratory effort. No intercostal retractions  LUNGS: Clear to auscultation bilaterally.  No stridor. No wheezes. No rales.  CARDIOVASCULAR: Regular rate and rhythm, positive S1-S2, no murmurs rubs or gallops appreciated. ABDOMINAL: Obese, soft, nontender, nondistended, positive bowel sounds in all 4 quadrants no apparent ascites.  EXTREMITIES: No pitting edema, warm to touch, faint DP and PT pulses. HEMATOLOGIC: No significant bruising NEUROLOGIC: Oriented to person, place, and time. Nonfocal. Normal muscle tone.  PSYCHIATRIC: Normal  mood and affect. Normal behavior. Cooperative  RADIOLOGY: MR Brain w/o contrast:  IMPRESSION: 1. Mildly motion degraded exam. 2. Confirmed 17 mm acute/early subacute infarct within the right pons. 3. Background mild chronic small vessel ischemic disease within the cerebral white matter and pons. Tiny chronic lacunar infarct within the left cerebellum. 4. Mild generalized parenchymal atrophy. 5. Mild paranasal sinus mucosal thickening. Trace left mastoid effusion.  CARDIAC DATABASE: Coronary artery bypass grafting: s/p CABG x 4 on 05/08/2017 (LIMA to LAD, SVG to PDA, SVG to Diagonal, SVG to Ramus.  EKG: 05/17/2021: Sinus bradycardia, 57 bpm, old inferior infarct, nonspecific T wave abnormality.   Echocardiogram: 09/17/2019: LVEF 55 to 60%, no regional wall motion arise, grade 2 diastolic impairment, elevated LVEDP, right ventricular size mildly dilated, biatrial dilatation, trivial MR, aortic valve sclerosis without stenosis.  Stress Testing:  Lexiscan myoview stress test 10/10/2017: 1. Lexiscan stress test was performed. Exercise capacity was not assessed. Stress symptoms included dizziness. Normal blood pressure. The resting electrocardiogram demonstrated normal sinus rhythm, normal resting conduction, no resting arrhythmias and normal rest repolarization. Stress EKG is non diagnostic for ischemia as it is a pharmacologic stress. 2. The overall quality of the study is good. Left ventricular cavity is noted to be normal on the rest and stress studies. Gated SPECT images reveal normal myocardial thickening and wall motion, except mild inferior hypokinesis. The left ventricular ejection fraction was calculated or visually estimated to be 53%. SPECT stress images demonstrate medium size, moderate intensity perfusion defect in basal to apical inferior, inferolateral myocardium. SPECT rest images demonstrate medium size, mild intensity perfusion defect in basal to apical inferior myocardium. This  suggests inferior infarct with periinfarct mild ischemia, as well as inferolateral ischemia. (LCx/PDA territory). 3. Intermediate risk study.  Heart Catheterization: Coronary angiogram 01/05/2019: Normal LVEDP, severe native vessel disease.  High-grade proximal RCA 90 to 95% stenosis followed by 70 to 80% stenosis in the in-stent restenotic proximal segment.  Large RCA.  SVG to RCA could not be visualized in spite of multiple catheter attempts, and presumed occluded. Left main is large and mildly diseased but patent.  LAD is occluded in the midsegment after the origin of D1, which has ostial 90% stenosis.  SVG to D1 is patent.  LIMA to LAD is patent. Ramus intermediate: Large vessel, mild to moderate calcific 80 to 85% stenosis in the proximal segment.  SVG to ramus intermediate is occluded. Circumflex: High-grade 99%/subtotal occlusion in the ostium.  Moderate to large sized vessel.  Small marginals.  Coronary intervention 01/26/2019: Successful PTCA and stenting of the proximal mid and mid to distal RCA with implantation of 2 overlapping 3.0 x 38 mm Promus Synergy DES, stenosis reduced from 90% to 0% with TIMI-3 to  TIMI-3 flow.     Recommendation: With medical high-grade ramus intermediate and circumflex stenosis, he has excellent brisk flow there and ischemia was in the inferior wall, however if he still persist to have recurrence of angina pectoris we will schedule him for complex intervention to the left coronary system.  In view of his underlying comorbidity, would recommend medical therapy  Carotid duplex: 09/17/2019: Right Carotid: Velocities in the right ICA are consistent with a 1-39% stenosis.  Left Carotid: Velocities in the left ICA are consistent with a 1-39% stenosis.  Vertebrals: Left vertebral artery demonstrates antegrade flow. Right vertebral artery was not visualized  14 - day [facility] Ambulatory Telemetry.  Dominant rhythm normal sinus. Heart rate 59-156 bpm.  Avg HR  85 bpm. No atrial fibrillation, ventricular tachycardia, high grade AV block, pauses (3 seconds or longer). Total ventricular and supraventricular ectopic burden <1%. Two episode of supraventricular tachycardia. Longest and fastest episode was 17 beats at maximum HR of 156 bpm. Patient triggered events: 0.  LABORATORY DATA: CBC Latest Ref Rng & Units 12/13/2020 09/15/2020 09/14/2020  WBC 3.8 - 10.8 Thousand/uL 7.8 10.5 8.9  Hemoglobin 13.2 - 17.1 g/dL 13.6 12.4(L) 12.0(L)  Hematocrit 38.5 - 50.0 % 41.3 35.0(L) 33.5(L)  Platelets 140 - 400 Thousand/uL 141 178 156    CMP Latest Ref Rng & Units 06/15/2021 05/29/2021 05/18/2021  Glucose 70 - 99 mg/dL 130(H) 123(H) 171(H)  BUN 8 - 27 mg/dL 30(H) 38(H) 27  Creatinine 0.76 - 1.27 mg/dL 1.59(H) 1.86(H) 1.43(H)  Sodium 134 - 144 mmol/L 141 140 142  Potassium 3.5 - 5.2 mmol/L 4.8 4.5 4.9  Chloride 96 - 106 mmol/L 106 108(H) 107(H)  CO2 20 - 29 mmol/L 14(L) 18(L) 20  Calcium 8.6 - 10.2 mg/dL 10.0 9.1 9.2  Total Protein 6.1 - 8.1 g/dL - - -  Total Bilirubin 0.2 - 1.2 mg/dL - - -  Alkaline Phos 38 - 126 U/L - - -  AST 10 - 35 U/L - - -  ALT 9 - 46 U/L - - -   Last lipids Lab Results  Component Value Date   CHOL 92 12/13/2020   HDL 43 12/13/2020   LDLCALC 32 12/13/2020   TRIG 90 12/13/2020   CHOLHDL 2.1 12/13/2020    Lab Results  Component Value Date   HGBA1C 8.5 (H) 12/13/2020   HGBA1C 8.1 (H) 09/06/2020   HGBA1C 6.6 (H) 05/08/2020   No components found for: NTPROBNP Lab Results  Component Value Date   TSH 4.89 (H) 12/13/2020   TSH 1.63 09/06/2020   TSH 3.02 05/08/2020    Cardiac Panel (last 3 results) No results for input(s): CKTOTAL, CKMB, TROPONINIHS, RELINDX in the last 72 hours.  IMPRESSION:    ICD-10-CM   1. Chronic heart failure with preserved ejection fraction (HFpEF) (HCC)  I50.32     2. Coronary artery disease involving native coronary artery of native heart without angina pectoris  I25.10     3. S/P primary  angioplasty with coronary stent  Z95.5     4. S/P CABG x 4  Z95.1     5. History of stroke  Z86.73     6. Type 2 diabetes mellitus with other circulatory complication, with long-term current use of insulin (HCC)  E11.59    Z79.4     7. Long-term insulin use (HCC)  Z79.4     8. Stage 3b chronic kidney disease (HCC)  N18.32     9. Former smoker  [phone]  RECOMMENDATIONS: David Burton is a 76 y.o. male whose past medical history and cardiovascular risk factors include: NSTEMI in Dec 2018, s/p CABG x 4 on 05/08/2017 (LIMA to LAD, SVG to PDA, SVG to Diagonal, SVG to Ramus. Surgery done at [place]), diabetes mellitus type 2 with peripheral neuropathy, hypertension, hypercholesterolemia, CVA (09/2019), advanced age.  Chronic heart failure with preserved ejection fraction (HFpEF) (HCC) Chronic and stable. Has lost 10 pounds since last office visit. Stage B, NYHA class II/III No longer consuming regular TV dinners and canned soups. Currently restarted on Farxiga and after improvement in renal insufficiency.  Repeat blood work scheduled for next week to reevaluate kidney function. If renal function remains stable we will transition him from losartan to [facility]. Will enroll the patient into principal care management for longitudinal follow-up and up titration of GDMT with a focus on reducing hospitalization. Educated importance of low-salt diet, fluid restrictions to 1500 cc, daily weights  Coronary artery disease involving native coronary artery of native heart without angina pectoris/status post PCI/four-vessel CABG: Denies angina pectoris No recent use of sublingual nitroglycerin tablets. Has increased physical activity to help facilitate weight loss. Medications reconciled. Currently on statin therapy, Zetia, PCSK9 inhibitor  History of stroke Educated on the importance of secondary prevention. Continue statin therapy, Zetia, PCSK9 inhibitors Educated  importance of glycemic control. Patient is motivated with regards to weight loss as he has lost 10 pounds since last office visit and feels great.  Type 2 diabetes mellitus with other circulatory complication, with long-term current use of insulin ([place]) Educated importance of glycemic control. Currently managed by primary care provider.   FINAL MEDICATION LIST END OF ENCOUNTER:   Current Outpatient Medications:    atorvastatin (LIPITOR) 20 MG tablet, TAKE 1 TABLET(20 MG) BY MOUTH DAILY, Disp: 90 tablet, Rfl: 1   BD PEN NEEDLE NANO 2ND GEN 32G X 4 MM MISC, USE 1 PEN THREE TIMES DAILY AS DIRECTED, Disp: 100 each, Rfl: 3   clopidogrel (PLAVIX) 75 MG tablet, TAKE 1 TABLET(75 MG) BY MOUTH DAILY, Disp: 90 tablet, Rfl: 0   Continuous Blood Gluc Sensor (DEXCOM G6 SENSOR) MISC, Use to test blood sugar once daily. Dx: E11.42, Disp: 9 each, Rfl: 3   Continuous Blood Gluc Transmit (DEXCOM G6 TRANSMITTER) MISC, Use to test blood sugar once daily. Dx: E11.42, Disp: 1 each, Rfl: 0   dapagliflozin propanediol (FARXIGA) 10 MG TABS tablet, Take 1 tablet (10 mg total) by mouth daily before breakfast., Disp: 90 tablet, Rfl: 0   ezetimibe (ZETIA) 10 MG tablet, TAKE 1 TABLET(10 MG) BY MOUTH DAILY, Disp: 90 tablet, Rfl: 3   glucose blood (ACCU-CHEK AVIVA PLUS) test strip, Use to test blood sugar 3 times daily. Dx: E11.42, Disp: 300 each, Rfl: 1   HYDROcodone-acetaminophen (NORCO) 10-325 MG tablet, Take 1 tablet by mouth 3 (three) times daily as needed., Disp: 90 tablet, Rfl: 0   insulin detemir (LEVEMIR) 100 UNIT/ML FlexPen, Inject 22 Units into the skin at bedtime., Disp: 30 mL, Rfl: 1   insulin lispro (HUMALOG KWIKPEN) 100 UNIT/ML KwikPen, Inject 8-10 units into the skin per sliding scale three times daily with meals., Disp: 45 mL, Rfl: 3   isosorbide mononitrate (IMDUR) 60 MG 24 hr tablet, TAKE 1 TABLET(60 MG) BY MOUTH DAILY, Disp: 90 tablet, Rfl: 1   losartan (COZAAR) 25 MG tablet, Take 1 tablet (25 mg total) by  mouth daily., Disp: 90 tablet, Rfl: 1   metoprolol tartrate (LOPRESSOR) 25 MG tablet, TAKE 1 TABLET(25  MG) BY MOUTH TWICE DAILY, Disp: 180 tablet, Rfl: 1   nitroGLYCERIN (NITROSTAT) 0.4 MG SL tablet, Dissolve one tablet under tongue as needed for chest pain., Disp: 30 tablet, Rfl: 3   NOVOLOG FLEXPEN 100 UNIT/ML FlexPen, , Disp: , Rfl:    REPATHA 140 MG/ML SOSY, Inject 140 mg into the skin every 14 (fourteen) days., Disp: 6 mL, Rfl: 3  No orders of the defined types were placed in this encounter.   --Continue cardiac medications as reconciled in final medication list. --Return in about 3 months (around 10/02/2021) for Follow up, CAD, heart failure management.. Or sooner if needed. --Continue follow-up with your primary care physician regarding the management of your other chronic comorbid conditions.  Patient's questions and concerns were addressed to his satisfaction. He voices understanding of the instructions provided during this encounter.   This note was created using a voice recognition software as a result there may be grammatical errors inadvertently enclosed that do not reflect the nature of this encounter. Every attempt is made to correct such errors.  Total time spent: 31 minutes.  [name], [place], [facility]  Pager: [phone] Office: [phone]

## 2021-07-06 ENCOUNTER — Other Ambulatory Visit: Payer: Self-pay | Admitting: Family Medicine

## 2021-07-06 ENCOUNTER — Other Ambulatory Visit: Payer: Self-pay

## 2021-07-06 DIAGNOSIS — G8929 Other chronic pain: Secondary | ICD-10-CM | POA: Diagnosis not present

## 2021-07-06 DIAGNOSIS — G894 Chronic pain syndrome: Secondary | ICD-10-CM

## 2021-07-06 DIAGNOSIS — I5189 Other ill-defined heart diseases: Secondary | ICD-10-CM | POA: Diagnosis not present

## 2021-07-06 DIAGNOSIS — Z79899 Other long term (current) drug therapy: Secondary | ICD-10-CM | POA: Diagnosis not present

## 2021-07-06 DIAGNOSIS — G4730 Sleep apnea, unspecified: Secondary | ICD-10-CM | POA: Diagnosis not present

## 2021-07-06 DIAGNOSIS — Z1159 Encounter for screening for other viral diseases: Secondary | ICD-10-CM | POA: Diagnosis not present

## 2021-07-06 DIAGNOSIS — N183 Chronic kidney disease, stage 3 unspecified: Secondary | ICD-10-CM | POA: Diagnosis not present

## 2021-07-06 DIAGNOSIS — I5022 Chronic systolic (congestive) heart failure: Secondary | ICD-10-CM | POA: Diagnosis not present

## 2021-07-06 DIAGNOSIS — M5442 Lumbago with sciatica, left side: Secondary | ICD-10-CM

## 2021-07-06 DIAGNOSIS — E559 Vitamin D deficiency, unspecified: Secondary | ICD-10-CM | POA: Diagnosis not present

## 2021-07-06 DIAGNOSIS — Z0001 Encounter for general adult medical examination with abnormal findings: Secondary | ICD-10-CM | POA: Diagnosis not present

## 2021-07-06 DIAGNOSIS — E1142 Type 2 diabetes mellitus with diabetic polyneuropathy: Secondary | ICD-10-CM | POA: Diagnosis not present

## 2021-07-06 MED ORDER — HYDROCODONE-ACETAMINOPHEN 10-325 MG PO TABS: 1.0000 | ORAL_TABLET | Freq: Three times a day (TID) | ORAL | 0 refills | Status: DC | PRN

## 2021-07-06 NOTE — Telephone Encounter (Signed)
Patient called for a refill request for Norco 10-325 MG tablets. Correct pharmacy is in chart.

## 2021-07-10 DIAGNOSIS — I2510 Atherosclerotic heart disease of native coronary artery without angina pectoris: Secondary | ICD-10-CM | POA: Diagnosis not present

## 2021-07-10 DIAGNOSIS — E7800 Pure hypercholesterolemia, unspecified: Secondary | ICD-10-CM | POA: Diagnosis not present

## 2021-07-11 ENCOUNTER — Ambulatory Visit
Admission: RE | Admit: 2021-07-11 | Discharge: 2021-07-11 | Disposition: A | Discharge status: Home / Self Care | Payer: Medicare Other | Source: Ambulatory Visit | Attending: Family Medicine | Admitting: Family Medicine

## 2021-07-11 DIAGNOSIS — G8929 Other chronic pain: Secondary | ICD-10-CM

## 2021-07-11 DIAGNOSIS — E1142 Type 2 diabetes mellitus with diabetic polyneuropathy: Secondary | ICD-10-CM | POA: Diagnosis not present

## 2021-07-11 DIAGNOSIS — M545 Low back pain, unspecified: Secondary | ICD-10-CM | POA: Diagnosis not present

## 2021-07-11 DIAGNOSIS — M5136 Other intervertebral disc degeneration, lumbar region: Secondary | ICD-10-CM | POA: Diagnosis not present

## 2021-07-11 LAB — CMP14+EGFR
ALT: 37 IU/L (ref 0–44)
AST: 30 IU/L (ref 0–40)
Albumin/Globulin Ratio: 1.6 (ref 1.2–2.2)
Albumin: 4.4 g/dL (ref 3.7–4.7)
Alkaline Phosphatase: 107 IU/L (ref 44–121)
BUN/Creatinine Ratio: 18 (ref 10–24)
BUN: 33 mg/dL — ABNORMAL HIGH (ref 8–27)
Bilirubin Total: 0.4 mg/dL (ref 0.0–1.2)
CO2: 20 mmol/L (ref 20–29)
Calcium: 9.3 mg/dL (ref 8.6–10.2)
Chloride: 107 mmol/L — ABNORMAL HIGH (ref 96–106)
Creatinine, Ser: 1.8 mg/dL — ABNORMAL HIGH (ref 0.76–1.27)
Globulin, Total: 2.8 g/dL (ref 1.5–4.5)
Glucose: 205 mg/dL — ABNORMAL HIGH (ref 70–99)
Potassium: 4.6 mmol/L (ref 3.5–5.2)
Sodium: 141 mmol/L (ref 134–144)
Total Protein: 7.2 g/dL (ref 6.0–8.5)
eGFR: 39 mL/min/{1.73_m2} — ABNORMAL LOW (ref >59)

## 2021-07-11 LAB — LIPID PANEL WITH LDL/HDL RATIO
Cholesterol, Total: 80 mg/dL — ABNORMAL LOW (ref 100–199)
HDL: 40 mg/dL (ref >39)
LDL Chol Calc (NIH): 18 mg/dL (ref 0–99)
LDL/HDL Ratio: 0.5 ratio (ref 0.0–3.6)
Triglycerides: 122 mg/dL (ref 0–149)
VLDL Cholesterol Cal: 22 mg/dL (ref 5–40)

## 2021-07-11 LAB — LDL CHOLESTEROL, DIRECT: LDL Direct: 22 mg/dL (ref 0–99)

## 2021-07-13 ENCOUNTER — Other Ambulatory Visit: Payer: Self-pay | Admitting: Pharmacist

## 2021-07-13 DIAGNOSIS — I5033 Acute on chronic diastolic (congestive) heart failure: Secondary | ICD-10-CM

## 2021-07-13 NOTE — Progress Notes (Signed)
Please have the front desk make him a a follow up visit.

## 2021-07-13 NOTE — Progress Notes (Signed)
Called and reviewed with pt. Pt reports that he has been taking aleve regularly over the past month or so, to help manage his chronic back and leg pains. Reports to taking 2-3 tablets aleve a day to manage his pain. Also taking intermittent tylenol for further relief. Pt willing to stop aleve and continue with just tylenol moving forward. Pt planing on discussing pain management with his PCP at their next visit. Pt agreed to get repeat labs in 1 week. Lab orders pended.   Pt reports that he recently had an OV w/ his PCP and was told that he had a murmer that he would like to discuss with you further. Denies any current concerns of SOB, dyspnea, lightheadedness, dizziness, syncope concerns. Last ECHO from 2021. Just wanted to review with you at your next OV.

## 2021-07-26 ENCOUNTER — Ambulatory Visit: Payer: Medicare Other | Admitting: Cardiology

## 2021-07-27 ENCOUNTER — Ambulatory Visit: Payer: Medicare Other | Admitting: Podiatry

## 2021-07-27 DIAGNOSIS — E1142 Type 2 diabetes mellitus with diabetic polyneuropathy: Secondary | ICD-10-CM | POA: Diagnosis not present

## 2021-07-27 DIAGNOSIS — M5442 Lumbago with sciatica, left side: Secondary | ICD-10-CM | POA: Diagnosis not present

## 2021-07-27 DIAGNOSIS — I700 Atherosclerosis of aorta: Secondary | ICD-10-CM | POA: Diagnosis not present

## 2021-07-27 DIAGNOSIS — G8929 Other chronic pain: Secondary | ICD-10-CM | POA: Diagnosis not present

## 2021-07-27 DIAGNOSIS — I5022 Chronic systolic (congestive) heart failure: Secondary | ICD-10-CM | POA: Diagnosis not present

## 2021-07-27 DIAGNOSIS — N183 Chronic kidney disease, stage 3 unspecified: Secondary | ICD-10-CM | POA: Diagnosis not present

## 2021-07-30 ENCOUNTER — Other Ambulatory Visit: Payer: Self-pay

## 2021-07-30 DIAGNOSIS — I5033 Acute on chronic diastolic (congestive) heart failure: Secondary | ICD-10-CM

## 2021-07-30 DIAGNOSIS — N183 Chronic kidney disease, stage 3 unspecified: Secondary | ICD-10-CM | POA: Diagnosis not present

## 2021-08-01 ENCOUNTER — Ambulatory Visit: Payer: Medicare Other | Admitting: Cardiology

## 2021-08-06 ENCOUNTER — Ambulatory Visit: Payer: Medicare Other | Admitting: Podiatry

## 2021-08-07 DIAGNOSIS — I5032 Chronic diastolic (congestive) heart failure: Secondary | ICD-10-CM | POA: Diagnosis not present

## 2021-08-08 DIAGNOSIS — E1142 Type 2 diabetes mellitus with diabetic polyneuropathy: Secondary | ICD-10-CM | POA: Diagnosis not present

## 2021-08-13 ENCOUNTER — Encounter: Payer: Self-pay | Admitting: Cardiology

## 2021-08-13 ENCOUNTER — Ambulatory Visit: Payer: Medicare Other | Admitting: Cardiology

## 2021-08-13 ENCOUNTER — Other Ambulatory Visit: Payer: Self-pay

## 2021-08-13 VITALS — BP 121/62 | HR 62 | Temp 96.6°F | Resp 14 | Ht 67.0 in | Wt 270.8 lb

## 2021-08-13 DIAGNOSIS — Z955 Presence of coronary angioplasty implant and graft: Secondary | ICD-10-CM

## 2021-08-13 DIAGNOSIS — Z794 Long term (current) use of insulin: Secondary | ICD-10-CM | POA: Diagnosis not present

## 2021-08-13 DIAGNOSIS — Z87891 Personal history of nicotine dependence: Secondary | ICD-10-CM | POA: Diagnosis not present

## 2021-08-13 DIAGNOSIS — E7800 Pure hypercholesterolemia, unspecified: Secondary | ICD-10-CM | POA: Diagnosis not present

## 2021-08-13 DIAGNOSIS — Z8673 Personal history of transient ischemic attack (TIA), and cerebral infarction without residual deficits: Secondary | ICD-10-CM | POA: Diagnosis not present

## 2021-08-13 DIAGNOSIS — Z951 Presence of aortocoronary bypass graft: Secondary | ICD-10-CM | POA: Diagnosis not present

## 2021-08-13 DIAGNOSIS — N1832 Chronic kidney disease, stage 3b: Secondary | ICD-10-CM | POA: Diagnosis not present

## 2021-08-13 DIAGNOSIS — I2510 Atherosclerotic heart disease of native coronary artery without angina pectoris: Secondary | ICD-10-CM | POA: Diagnosis not present

## 2021-08-13 DIAGNOSIS — E1159 Type 2 diabetes mellitus with other circulatory complications: Secondary | ICD-10-CM | POA: Diagnosis not present

## 2021-08-13 DIAGNOSIS — I5032 Chronic diastolic (congestive) heart failure: Secondary | ICD-10-CM | POA: Diagnosis not present

## 2021-08-13 NOTE — Progress Notes (Signed)
David Burton Date of Birth: 28-Apr-1946 MRN: [id number] Primary Care Provider:[name], [name], NP Former Cardiology Providers: Dr. Adrian Prows, Jeri Lager, APRN, FNP-C Primary Cardiologist: [name], DO, [facility] (established care 01/11/2020)  Date: 08/13/21 Last Office Visit: 07/05/2021  Chief Complaint  Patient presents with   Congestive Heart Failure   Heart Murmur   Results    labs   Follow-up    HPI  David Burton is a 76 y.o.  male whose  past medical history and cardiovascular risk factors include: Chronic HFpEF, NSTEMI in Dec 2018, s/p CABG x 4 on 05/08/2017 (LIMA to LAD, SVG to PDA, SVG to Diagonal, SVG to Ramus. Surgery done at [place]), diabetes mellitus type 2 with peripheral neuropathy, hypertension, hypercholesterolemia, CVA (09/2019), advanced age.  Patient is being followed by the practice for management of his CAD and HFpEF.  Patient came in for sick visit in December 2022 due to worsening lower extremity swelling.  This was likely due to dietary indiscretion as he was consuming significant amounts of TV dinners, canned chicken noodle soups, etc.  After dietary guidance and starting [place] patient improved clinically but also developed acute kidney injury as he was taking excessive use of NSAIDs for pain control.  Patient was asked to discontinue NSAIDs and to have repeat blood work done which noted improvement in serum creatinine levels.  Once renal function improves and the plan was to transition him from losartan to [facility]; however, in the meantime patient has establish care with a new PCP who started him on Lasix 40 mg p.o. daily.  He is currently on loop diuretics.  He denies any chest pain or shortness of breath at rest or with effort related activities.  Continues to have lower extremity swelling.   In addition, patient states that his primary care was concerned for a cardiac murmur and requested an echocardiogram.  FUNCTIONAL STATUS: He walks  atleast 0.5 miles per day.    ALLERGIES: No Known Allergies  MEDICATION LIST PRIOR TO VISIT: Current Outpatient Medications on File Prior to Visit  Medication Sig Dispense Refill   amLODipine (NORVASC) 10 MG tablet Take 10 mg by mouth daily.     atorvastatin (LIPITOR) 20 MG tablet TAKE 1 TABLET(20 MG) BY MOUTH DAILY 90 tablet 1   BD PEN NEEDLE NANO 2ND GEN 32G X 4 MM MISC USE 1 PEN THREE TIMES DAILY AS DIRECTED 100 each 3   clopidogrel (PLAVIX) 75 MG tablet TAKE 1 TABLET(75 MG) BY MOUTH DAILY 90 tablet 0   Continuous Blood Gluc Sensor (DEXCOM G6 SENSOR) MISC Use to test blood sugar once daily. Dx: E11.42 9 each 3   Continuous Blood Gluc Transmit (DEXCOM G6 TRANSMITTER) MISC Use to test blood sugar once daily. Dx: E11.42 1 each 0   dapagliflozin propanediol (FARXIGA) 10 MG TABS tablet Take 1 tablet (10 mg total) by mouth daily before breakfast. 90 tablet 0   ezetimibe (ZETIA) 10 MG tablet TAKE 1 TABLET(10 MG) BY MOUTH DAILY 90 tablet 3   furosemide (LASIX) 40 MG tablet Take 40 mg by mouth every morning.     gabapentin (NEURONTIN) 100 MG capsule Take 100 mg by mouth 3 (three) times daily.     glucose blood (ACCU-CHEK AVIVA PLUS) test strip Use to test blood sugar 3 times daily. Dx: E11.42 300 each 1   HYDROcodone-acetaminophen (NORCO) 10-325 MG tablet Take 1 tablet by mouth 3 (three) times daily as needed. 90 tablet 0   insulin detemir (LEVEMIR) 100  UNIT/ML FlexPen Inject 22 Units into the skin at bedtime. 30 mL 1   insulin lispro (HUMALOG KWIKPEN) 100 UNIT/ML KwikPen Inject 8-10 units into the skin per sliding scale three times daily with meals. 45 mL 3   isosorbide mononitrate (IMDUR) 60 MG 24 hr tablet TAKE 1 TABLET(60 MG) BY MOUTH DAILY 90 tablet 1   losartan (COZAAR) 25 MG tablet Take 1 tablet (25 mg total) by mouth daily. 90 tablet 1   metoprolol tartrate (LOPRESSOR) 25 MG tablet TAKE 1 TABLET(25 MG) BY MOUTH TWICE DAILY 180 tablet 1   nitroGLYCERIN (NITROSTAT) 0.4 MG SL tablet Dissolve  one tablet under tongue as needed for chest pain. 30 tablet 3   NOVOLOG FLEXPEN 100 UNIT/ML FlexPen      REPATHA 140 MG/ML SOSY Inject 140 mg into the skin every 14 (fourteen) days. 6 mL 3   Vitamin D, Ergocalciferol, (DRISDOL) 1.25 MG (50000 UNIT) CAPS capsule Take 50,000 Units by mouth once a week.     No current facility-administered medications on file prior to visit.    PAST MEDICAL HISTORY: Past Medical History:  Diagnosis Date   Bell palsy    CKD (chronic kidney disease), stage III ([place]) 12/16/2018   Coronary artery disease involving native coronary artery of native heart with unstable angina pectoris ([place]) 12/16/2018   GERD (gastroesophageal reflux disease)    Hypertension    Hypertension with heart disease 12/16/2018   Myocardial infarction ([place])    Pure hypercholesterolemia 12/16/2018   Stroke ([place])    Type 2 diabetes, controlled, with peripheral neuropathy ([place]) 12/16/2018    PAST SURGICAL HISTORY: Past Surgical History:  Procedure Laterality Date   CARDIAC SURGERY     COLONOSCOPY WITH PROPOFOL N/A 01/02/2018   Procedure: COLONOSCOPY WITH PROPOFOL;  Surgeon: Carol Ada, MD;  Location: [facility];  Service: Endoscopy;  Laterality: N/A;   CORONARY STENT INTERVENTION N/A 01/26/2019   Procedure: CORONARY STENT INTERVENTION;  Surgeon: Adrian Prows, MD;  Location: [place] [facility];  Service: Cardiovascular;  Laterality: N/A;   LEFT HEART CATH AND CORS/GRAFTS ANGIOGRAPHY N/A 01/05/2019   Procedure: LEFT HEART CATH AND CORS/GRAFTS ANGIOGRAPHY;  Surgeon: Adrian Prows, MD;  Location: [place] [facility];  Service: Cardiovascular;  Laterality: N/A;   POLYPECTOMY  01/02/2018   Procedure: POLYPECTOMY;  Surgeon: Carol Ada, MD;  Location: [facility];  Service: Endoscopy;;   stents      FAMILY HISTORY: The patient's family history includes Cancer in his mother; Heart attack in his father.   SOCIAL HISTORY:  The patient  reports that he quit smoking about 18 years ago. His smoking use  included cigarettes. He has a 5.00 pack-year smoking history. He has never used smokeless tobacco. He reports that he does not currently use alcohol. He reports that he does not use drugs.  Review of Systems  Constitutional: Negative for chills and fever.  HENT:  Negative for hoarse voice and nosebleeds.   Eyes:  Negative for discharge, double vision and pain.  Cardiovascular:  Positive for dyspnea on exertion (stable), bilateral leg swelling left greater than right. Negative for chest pain, claudication, near-syncope, orthopnea, palpitations, paroxysmal nocturnal dyspnea and syncope.  Respiratory:  Negative for hemoptysis and shortness of breath.   Musculoskeletal:  Negative for muscle cramps and myalgias.  Gastrointestinal:  Negative for abdominal pain, constipation, diarrhea, hematemesis, hematochezia, melena, nausea and vomiting.  Neurological:  Positive for focal weakness (left leg weakness (residual from recent CVA) improving. ). Negative for dizziness and light-headedness.  PHYSICAL EXAM: Vitals with BMI 08/13/2021 07/05/2021 05/17/2021  Height '5\' 7"'$  '5\' 7"'$  '5\' 7"'$   Weight 270 lbs 13 oz 262 lbs 272 lbs  BMI 42.4 40.98 11.91  Systolic 478 295 621  Diastolic 62 69 73  Pulse 62 59 60   CONSTITUTIONAL: Appears older than stated age, hemodynamically stable, no acute distress  SKIN: Skin is warm and dry. No rash noted. No cyanosis. No pallor. No jaundice HEAD: Normocephalic and atraumatic.  EYES: No scleral icterus MOUTH/THROAT: Moist oral membranes.  NECK: No JVD present. No thyromegaly noted. No carotid bruits  LYMPHATIC: No visible cervical adenopathy.  CHEST Normal respiratory effort. No intercostal retractions  LUNGS: Clear to auscultation bilaterally.  No stridor. No wheezes. No rales.  CARDIOVASCULAR: Regular rate and rhythm, positive S1-S2, no murmurs rubs or gallops appreciated. ABDOMINAL: Obese, soft, nontender, nondistended, positive bowel sounds in all 4 quadrants no apparent  ascites.  EXTREMITIES: Left greater than right +1 pitting edema, warm to touch, faint DP and PT pulses. HEMATOLOGIC: No significant bruising NEUROLOGIC: Oriented to person, place, and time. Nonfocal. Normal muscle tone.  PSYCHIATRIC: Normal mood and affect. Normal behavior. Cooperative  RADIOLOGY: MR Brain w/o contrast:  IMPRESSION: 1. Mildly motion degraded exam. 2. Confirmed 17 mm acute/early subacute infarct within the right pons. 3. Background mild chronic small vessel ischemic disease within the cerebral white matter and pons. Tiny chronic lacunar infarct within the left cerebellum. 4. Mild generalized parenchymal atrophy. 5. Mild paranasal sinus mucosal thickening. Trace left mastoid effusion.  CARDIAC DATABASE: Coronary artery bypass grafting: s/p CABG x 4 on 05/08/2017 (LIMA to LAD, SVG to PDA, SVG to Diagonal, SVG to Ramus.  EKG: 05/17/2021: Sinus bradycardia, 57 bpm, old inferior infarct, nonspecific T wave abnormality.   Echocardiogram: 09/17/2019: LVEF 55 to 60%, no regional wall motion arise, grade 2 diastolic impairment, elevated LVEDP, right ventricular size mildly dilated, biatrial dilatation, trivial MR, aortic valve sclerosis without stenosis.  Stress Testing:  Lexiscan myoview stress test 10/10/2017: 1. Lexiscan stress test was performed. Exercise capacity was not assessed. Stress symptoms included dizziness. Normal blood pressure. The resting electrocardiogram demonstrated normal sinus rhythm, normal resting conduction, no resting arrhythmias and normal rest repolarization. Stress EKG is non diagnostic for ischemia as it is a pharmacologic stress. 2. The overall quality of the study is good. Left ventricular cavity is noted to be normal on the rest and stress studies. Gated SPECT images reveal normal myocardial thickening and wall motion, except mild inferior hypokinesis. The left ventricular ejection fraction was calculated or visually estimated to be 53%. SPECT stress  images demonstrate medium size, moderate intensity perfusion defect in basal to apical inferior, inferolateral myocardium. SPECT rest images demonstrate medium size, mild intensity perfusion defect in basal to apical inferior myocardium. This suggests inferior infarct with periinfarct mild ischemia, as well as inferolateral ischemia. (LCx/PDA territory). 3. Intermediate risk study.  Heart Catheterization: Coronary angiogram 01/05/2019: Normal LVEDP, severe native vessel disease.  High-grade proximal RCA 90 to 95% stenosis followed by 70 to 80% stenosis in the in-stent restenotic proximal segment.  Large RCA.  SVG to RCA could not be visualized in spite of multiple catheter attempts, and presumed occluded. Left main is large and mildly diseased but patent.  LAD is occluded in the midsegment after the origin of D1, which has ostial 90% stenosis.  SVG to D1 is patent.  LIMA to LAD is patent. Ramus intermediate: Large vessel, mild to moderate calcific 80 to 85% stenosis in the proximal segment.  SVG to  ramus intermediate is occluded. Circumflex: High-grade 99%/subtotal occlusion in the ostium.  Moderate to large sized vessel.  Small marginals.  Coronary intervention 01/26/2019: Successful PTCA and stenting of the proximal mid and mid to distal RCA with implantation of 2 overlapping 3.0 x 38 mm Promus Synergy DES, stenosis reduced from 90% to 0% with TIMI-3 to TIMI-3 flow.     Recommendation: With medical high-grade ramus intermediate and circumflex stenosis, he has excellent brisk flow there and ischemia was in the inferior wall, however if he still persist to have recurrence of angina pectoris we will schedule him for complex intervention to the left coronary system.  In view of his underlying comorbidity, would recommend medical therapy  Carotid duplex: 09/17/2019: Right Carotid: Velocities in the right ICA are consistent with a 1-39% stenosis.  Left Carotid: Velocities in the left ICA are consistent  with a 1-39% stenosis.  Vertebrals: Left vertebral artery demonstrates antegrade flow. Right vertebral artery was not visualized  14 - day [facility] Tele[facility].  Dominant rhythm normal sinus. Heart rate 59-156 bpm.  Avg HR 85 bpm. No atrial fibrillation, ventricular tachycardia, high grade AV block, pauses (3 seconds or longer). Total ventricular and supraventricular ectopic burden <1%. Two episode of supraventricular tachycardia. Longest and fastest episode was 17 beats at maximum HR of 156 bpm. Patient triggered events: 0.  LABORATORY DATA: CBC Latest Ref Rng & Units 12/13/2020 09/15/2020 09/14/2020  WBC 3.8 - 10.8 Thousand/uL 7.8 10.5 8.9  Hemoglobin 13.2 - 17.1 g/dL 13.6 12.4(L) 12.0(L)  Hematocrit 38.5 - 50.0 % 41.3 35.0(L) 33.5(L)  Platelets 140 - 400 Thousand/uL 141 178 156    CMP Latest Ref Rng & Units 07/10/2021 06/15/2021 05/29/2021  Glucose 70 - 99 mg/dL 205(H) 130(H) 123(H)  BUN 8 - 27 mg/dL 33(H) 30(H) 38(H)  Creatinine 0.76 - 1.27 mg/dL 1.80(H) 1.59(H) 1.86(H)  Sodium 134 - 144 mmol/L 141 141 140  Potassium 3.5 - 5.2 mmol/L 4.6 4.8 4.5  Chloride 96 - 106 mmol/L 107(H) 106 108(H)  CO2 20 - 29 mmol/L 20 14(L) 18(L)  Calcium 8.6 - 10.2 mg/dL 9.3 10.0 9.1  Total Protein 6.0 - 8.5 g/dL 7.2 - -  Total Bilirubin 0.0 - 1.2 mg/dL 0.4 - -  Alkaline Phos 44 - 121 IU/L 107 - -  AST 0 - 40 IU/L 30 - -  ALT 0 - 44 IU/L 37 - -   Last lipids Lab Results  Component Value Date   CHOL 80 (L) 07/10/2021   HDL 40 07/10/2021   LDLCALC 18 07/10/2021   LDLDIRECT 22 07/10/2021   TRIG 122 07/10/2021   CHOLHDL 2.1 12/13/2020    Lab Results  Component Value Date   HGBA1C 8.5 (H) 12/13/2020   HGBA1C 8.1 (H) 09/06/2020   HGBA1C 6.6 (H) 05/08/2020   No components found for: NTPROBNP Lab Results  Component Value Date   TSH 4.89 (H) 12/13/2020   TSH 1.63 09/06/2020   TSH 3.02 05/08/2020    Cardiac Panel (last 3 results) No results for input(s): CKTOTAL, CKMB,  TROPONINIHS, RELINDX in the last 72 hours.  IMPRESSION:    ICD-10-CM   1. Chronic heart failure with preserved ejection fraction (HFpEF) (HCC)  I50.32 Pro b natriuretic peptide (BNP)    Basic metabolic panel    Magnesium    PCV ECHOCARDIOGRAM COMPLETE    2. Coronary artery disease involving native coronary artery of native heart without angina pectoris  I25.10 PCV ECHOCARDIOGRAM COMPLETE    3. S/P primary angioplasty  with coronary stent  Z95.5 PCV ECHOCARDIOGRAM COMPLETE    4. S/P CABG x 4  Z95.1 PCV ECHOCARDIOGRAM COMPLETE    5. History of stroke  Z86.73     6. Type 2 diabetes mellitus with other circulatory complication, with long-term current use of insulin (HCC)  E11.59    Z79.4     7. Long-term insulin use (HCC)  Z79.4     8. Pure hypercholesterolemia  E78.00     9. Stage 3b chronic kidney disease (HCC)  N18.32     10. Former smoker  Z87.891         RECOMMENDATIONS: Tab Rylee is a 76 y.o. male whose past medical history and cardiovascular risk factors include: NSTEMI in Dec 2018, s/p CABG x 4 on 05/08/2017 (LIMA to LAD, SVG to PDA, SVG to Diagonal, SVG to Ramus. Surgery done at [place]), diabetes mellitus type 2 with peripheral neuropathy, hypertension, hypercholesterolemia, CVA (09/2019), advanced age.  Chronic heart failure with preserved ejection fraction (HFpEF) (HCC) Stage C, NYHA class II/III. Recently started on loop diuretics by another medical provider. Check BMP to reevaluate renal function. If renal function stable, transition losartan to Entresto. Continue Farxiga, Lasix, Imdur, losartan, Lopressor Reemphasized importance of low-salt diet. Reemphasized the importance of improving his modifiable risk factors. Educated importance of fluid restrictions, daily weights. Recommend close follow-up.  Coronary artery disease involving native coronary artery of native heart without angina pectoris, S/P primary angioplasty with coronary stent,  S/P CABG x 4 Denies angina pectoris. No use of sublingual nitroglycerin tablets since last office encounter. Increase physical activity as tolerated with a goal of 30 minutes a day 5 days a week. LDL within excellent control. Educated on importance of improving his modifiable cardiovascular risk factors. Continue statin therapy, Zetia, PCSK9 inhibitors.  Type 2 diabetes mellitus with other circulatory complication, with long-term current use of insulin (HCC) Improving. We emphasized the importance of glycemic control. Currently on statin therapy, Repatha, ARB  Pure hypercholesterolemia Currently on maximally tolerated statin therapy, Zetia, PCSK9 inhibitor.   He denies myalgia or other side effects. Most recent lipids dated 07/10/2021 reviewed as noted above. Currently managed by primary care provider.  FINAL MEDICATION LIST END OF ENCOUNTER:   Current Outpatient Medications:    amLODipine (NORVASC) 10 MG tablet, Take 10 mg by mouth daily., Disp: , Rfl:    atorvastatin (LIPITOR) 20 MG tablet, TAKE 1 TABLET(20 MG) BY MOUTH DAILY, Disp: 90 tablet, Rfl: 1   BD PEN NEEDLE NANO 2ND GEN 32G X 4 MM MISC, USE 1 PEN THREE TIMES DAILY AS DIRECTED, Disp: 100 each, Rfl: 3   clopidogrel (PLAVIX) 75 MG tablet, TAKE 1 TABLET(75 MG) BY MOUTH DAILY, Disp: 90 tablet, Rfl: 0   Continuous Blood Gluc Sensor (DEXCOM G6 SENSOR) MISC, Use to test blood sugar once daily. Dx: E11.42, Disp: 9 each, Rfl: 3   Continuous Blood Gluc Transmit (DEXCOM G6 TRANSMITTER) MISC, Use to test blood sugar once daily. Dx: E11.42, Disp: 1 each, Rfl: 0   dapagliflozin propanediol (FARXIGA) 10 MG TABS tablet, Take 1 tablet (10 mg total) by mouth daily before breakfast., Disp: 90 tablet, Rfl: 0   ezetimibe (ZETIA) 10 MG tablet, TAKE 1 TABLET(10 MG) BY MOUTH DAILY, Disp: 90 tablet, Rfl: 3   furosemide (LASIX) 40 MG tablet, Take 40 mg by mouth every morning., Disp: , Rfl:    gabapentin (NEURONTIN) 100 MG capsule, Take 100 mg by mouth 3  (three) times daily., Disp: , Rfl:  glucose blood (ACCU-CHEK AVIVA PLUS) test strip, Use to test blood sugar 3 times daily. Dx: E11.42, Disp: 300 each, Rfl: 1   HYDROcodone-acetaminophen (NORCO) 10-325 MG tablet, Take 1 tablet by mouth 3 (three) times daily as needed., Disp: 90 tablet, Rfl: 0   insulin detemir (LEVEMIR) 100 UNIT/ML FlexPen, Inject 22 Units into the skin at bedtime., Disp: 30 mL, Rfl: 1   insulin lispro (HUMALOG KWIKPEN) 100 UNIT/ML KwikPen, Inject 8-10 units into the skin per sliding scale three times daily with meals., Disp: 45 mL, Rfl: 3   isosorbide mononitrate (IMDUR) 60 MG 24 hr tablet, TAKE 1 TABLET(60 MG) BY MOUTH DAILY, Disp: 90 tablet, Rfl: 1   losartan (COZAAR) 25 MG tablet, Take 1 tablet (25 mg total) by mouth daily., Disp: 90 tablet, Rfl: 1   metoprolol tartrate (LOPRESSOR) 25 MG tablet, TAKE 1 TABLET(25 MG) BY MOUTH TWICE DAILY, Disp: 180 tablet, Rfl: 1   nitroGLYCERIN (NITROSTAT) 0.4 MG SL tablet, Dissolve one tablet under tongue as needed for chest pain., Disp: 30 tablet, Rfl: 3   NOVOLOG FLEXPEN 100 UNIT/ML FlexPen, , Disp: , Rfl:    REPATHA 140 MG/ML SOSY, Inject 140 mg into the skin every 14 (fourteen) days., Disp: 6 mL, Rfl: 3   Vitamin D, Ergocalciferol, (DRISDOL) 1.25 MG (50000 UNIT) CAPS capsule, Take 50,000 Units by mouth once a week., Disp: , Rfl:   Orders Placed This Encounter  Procedures   Pro b natriuretic peptide (BNP)   Basic metabolic panel   Magnesium   PCV ECHOCARDIOGRAM COMPLETE     --Continue cardiac medications as reconciled in final medication list. --Return in about 3 months (around 11/13/2021) for Follow up, heart failure management.. Or sooner if needed. --Continue follow-up with your primary care physician regarding the management of your other chronic comorbid conditions.  Patient's questions and concerns were addressed to his satisfaction. He voices understanding of the instructions provided during this encounter.   This note was  created using a voice recognition software as a result there may be grammatical errors inadvertently enclosed that do not reflect the nature of this encounter. Every attempt is made to correct such errors.  Total time spent: 31 minutes.  [name], [place], [facility]  Pager: [phone] Office: [phone]

## 2021-08-14 ENCOUNTER — Other Ambulatory Visit: Payer: Self-pay | Admitting: *Deleted

## 2021-08-14 NOTE — Patient Outreach (Signed)
[place] [facility]) Care Management ? ?08/14/2021 ? ?Amarien Carne ?1946/01/10 ?[id number] ? ?RN Health Coach attempted follow up outreach call to patient.  Patient was unavailable. HIPPA compliance voicemail message left with return callback number. ? ?Plan: ?RN will call patient again within 30 days. ? ?Johny Shock BSN RN ?[place] Management ?[phone] ? ? ?

## 2021-08-15 ENCOUNTER — Ambulatory Visit (INDEPENDENT_AMBULATORY_CARE_PROVIDER_SITE_OTHER): Payer: Medicare Other | Admitting: Podiatry

## 2021-08-15 ENCOUNTER — Encounter: Payer: Self-pay | Admitting: Podiatry

## 2021-08-15 ENCOUNTER — Other Ambulatory Visit: Payer: Self-pay

## 2021-08-15 DIAGNOSIS — B351 Tinea unguium: Secondary | ICD-10-CM

## 2021-08-15 DIAGNOSIS — E1142 Type 2 diabetes mellitus with diabetic polyneuropathy: Secondary | ICD-10-CM | POA: Diagnosis not present

## 2021-08-15 DIAGNOSIS — M79675 Pain in left toe(s): Secondary | ICD-10-CM

## 2021-08-15 DIAGNOSIS — M79674 Pain in right toe(s): Secondary | ICD-10-CM

## 2021-08-15 DIAGNOSIS — L84 Corns and callosities: Secondary | ICD-10-CM

## 2021-08-16 DIAGNOSIS — I5032 Chronic diastolic (congestive) heart failure: Secondary | ICD-10-CM | POA: Diagnosis not present

## 2021-08-17 ENCOUNTER — Other Ambulatory Visit: Payer: Self-pay | Admitting: Cardiology

## 2021-08-17 DIAGNOSIS — I5033 Acute on chronic diastolic (congestive) heart failure: Secondary | ICD-10-CM

## 2021-08-17 LAB — BASIC METABOLIC PANEL
BUN/Creatinine Ratio: 20 (ref 10–24)
BUN: 36 mg/dL — ABNORMAL HIGH (ref 8–27)
CO2: 21 mmol/L (ref 20–29)
Calcium: 9.4 mg/dL (ref 8.6–10.2)
Chloride: 107 mmol/L — ABNORMAL HIGH (ref 96–106)
Creatinine, Ser: 1.77 mg/dL — ABNORMAL HIGH (ref 0.76–1.27)
Glucose: 178 mg/dL — ABNORMAL HIGH (ref 70–99)
Potassium: 4.7 mmol/L (ref 3.5–5.2)
Sodium: 141 mmol/L (ref 134–144)
eGFR: 40 mL/min/{1.73_m2} — ABNORMAL LOW (ref >59)

## 2021-08-17 LAB — MAGNESIUM: Magnesium: 2.1 mg/dL (ref 1.6–2.3)

## 2021-08-17 LAB — PRO B NATRIURETIC PEPTIDE: NT-Pro BNP: 334 pg/mL (ref 0–376)

## 2021-08-19 ENCOUNTER — Other Ambulatory Visit: Payer: Self-pay | Admitting: Cardiology

## 2021-08-19 ENCOUNTER — Other Ambulatory Visit: Payer: Self-pay | Admitting: Family

## 2021-08-19 DIAGNOSIS — I5032 Chronic diastolic (congestive) heart failure: Secondary | ICD-10-CM

## 2021-08-19 DIAGNOSIS — E1129 Type 2 diabetes mellitus with other diabetic kidney complication: Secondary | ICD-10-CM

## 2021-08-19 DIAGNOSIS — Z8673 Personal history of transient ischemic attack (TIA), and cerebral infarction without residual deficits: Secondary | ICD-10-CM

## 2021-08-19 DIAGNOSIS — Z955 Presence of coronary angioplasty implant and graft: Secondary | ICD-10-CM

## 2021-08-19 MED ORDER — ENTRESTO 24-26 MG PO TABS: 1.0000 | ORAL_TABLET | Freq: Two times a day (BID) | ORAL | 0 refills | Status: DC

## 2021-08-19 NOTE — Progress Notes (Signed)
Labs from 08/16/2021 reviewed. ?Discontinue losartan and amlodipine. ?Sent in a prescription for Entresto 24/26 mg p.o. twice daily. ?Labs in 1 week. ? ?[name], DO, FACC ?

## 2021-08-20 NOTE — Progress Notes (Signed)
Reviewed lab results with pt's daughter. Pt to stop amlodipine and losartan as instructed and start Entresto 242/26 mg BID. Called pt's pharmacy to confirm that the Rx was covered at no-charge. Per daughter, pt continues to have mild swelling in his legs, but stable with no acute worsening of swelling or weight gain. Pt scheduled to come in for an ECHO tomorrow. Pt aware to get repeat labs in 1 week since starting Entresto 24/26 mg BID.

## 2021-08-21 ENCOUNTER — Other Ambulatory Visit: Payer: Self-pay

## 2021-08-21 ENCOUNTER — Ambulatory Visit: Payer: Medicare Other

## 2021-08-21 DIAGNOSIS — E7800 Pure hypercholesterolemia, unspecified: Secondary | ICD-10-CM

## 2021-08-21 DIAGNOSIS — I2510 Atherosclerotic heart disease of native coronary artery without angina pectoris: Secondary | ICD-10-CM

## 2021-08-21 DIAGNOSIS — Z8673 Personal history of transient ischemic attack (TIA), and cerebral infarction without residual deficits: Secondary | ICD-10-CM

## 2021-08-21 DIAGNOSIS — I5032 Chronic diastolic (congestive) heart failure: Secondary | ICD-10-CM

## 2021-08-21 DIAGNOSIS — Z955 Presence of coronary angioplasty implant and graft: Secondary | ICD-10-CM

## 2021-08-21 DIAGNOSIS — Z951 Presence of aortocoronary bypass graft: Secondary | ICD-10-CM

## 2021-08-21 MED ORDER — REPATHA 140 MG/ML ~~LOC~~ SOSY: 140.0000 mg | PREFILLED_SYRINGE | SUBCUTANEOUS | 3 refills | Status: DC

## 2021-08-21 NOTE — Progress Notes (Signed)
?  Subjective:  ?Patient ID: David Burton, male    DOB: Apr 24, 1946,  MRN: [id number] ? ?David Burton presents to clinic today for at risk foot care with history of diabetic neuropathy ? ?Patient states blood glucose was 183 mg/dl today.  ? ?David Burton has lower extremity numbness R>L which we discussed on last visit. Had normal ABI studies. David Burton has known chronic back issues. Still has not followed up with Ortho nor Neurology regarding lower extremity numbness. David Burton will discuss with PCP. ? ?New problem(s): None.  ? ?Last documented PCP is [name], [name], NP , and last visit was March 30, 2021. David Burton states David Burton is with [facility] and cannot remember the provider's name. ? ?No Known Allergies ? ?Review of Systems: Negative except as noted in the HPI. ? ?Objective: No changes noted in today's physical examination. ?General: Patient is a pleasant 76 y.o. African American male obese in NAD. AAO x 3.  ? ?Neurovascular Examination: ?CFT <3 seconds b/l LE. Faintly palpable pedal pulses b/l. Pedal hair absent. Lower extremity skin temperature gradient within normal limits. Trace edema noted BLE. ? ?Protective sensation intact 5/5 intact bilaterally with 10g monofilament b/l. ? ?Dermatological:  ?Pedal integument with normal turgor, texture and tone b/l LE. No open wounds b/l. No interdigital macerations b/l. Toenails 1-5 b/l elongated, thickened, discolored with subungual debris. +Tenderness with dorsal palpation of nailplates. Hyperkeratotic lesion(s) submet head 5 left foot.  No erythema, no edema, no drainage, no fluctuance. ? ?Musculoskeletal:  ?Normal muscle strength 5/5 to all lower extremity muscle groups bilaterally. No pain, crepitus or joint limitation noted with ROM bilateral LE. HAV with bunion deformity noted b/l LE. Pes planus deformity noted bilateral LE. ?Hemoglobin A1C Latest Ref Rng & Units 12/13/2020 09/06/2020  ?HGBA1C <5.7 % of total Hgb 8.5(H) 8.1(H)  ?Some recent data might be hidden  ? ?Assessment/Plan: ?1.  Pain due to onychomycosis of toenails of both feet   ?2. Callus   ?3. Type 2 diabetes, controlled, with peripheral neuropathy ([place])   ?  ?-Examined patient. ?-No new findings. No new orders. ?-Toenails 1-5 b/l were debrided in length and girth with sterile nail nippers and dremel without iatrogenic bleeding.  ?-Callus(es) submet head 5 left foot pared utilizing sterile scalpel blade without complication or incident. Total number debrided =1. ?-Patient/POA to call should there be question/concern in the interim.  ? ?Return in about 3 months (around 11/15/2021). ? ?Marzetta Board, DPM  ?

## 2021-08-24 DIAGNOSIS — F01518 Vascular dementia, unspecified severity, with other behavioral disturbance: Secondary | ICD-10-CM | POA: Diagnosis not present

## 2021-08-24 DIAGNOSIS — G8929 Other chronic pain: Secondary | ICD-10-CM | POA: Diagnosis not present

## 2021-08-24 DIAGNOSIS — Z23 Encounter for immunization: Secondary | ICD-10-CM | POA: Diagnosis not present

## 2021-08-24 DIAGNOSIS — M5442 Lumbago with sciatica, left side: Secondary | ICD-10-CM | POA: Diagnosis not present

## 2021-08-24 DIAGNOSIS — E0822 Diabetes mellitus due to underlying condition with diabetic chronic kidney disease: Secondary | ICD-10-CM | POA: Diagnosis not present

## 2021-08-24 DIAGNOSIS — I25708 Atherosclerosis of coronary artery bypass graft(s), unspecified, with other forms of angina pectoris: Secondary | ICD-10-CM | POA: Diagnosis not present

## 2021-08-24 DIAGNOSIS — I5022 Chronic systolic (congestive) heart failure: Secondary | ICD-10-CM | POA: Diagnosis not present

## 2021-08-24 DIAGNOSIS — N2581 Secondary hyperparathyroidism of renal origin: Secondary | ICD-10-CM | POA: Diagnosis not present

## 2021-08-25 ENCOUNTER — Other Ambulatory Visit: Payer: Self-pay | Admitting: Family

## 2021-08-25 DIAGNOSIS — N183 Chronic kidney disease, stage 3 unspecified: Secondary | ICD-10-CM

## 2021-08-25 DIAGNOSIS — I129 Hypertensive chronic kidney disease with stage 1 through stage 4 chronic kidney disease, or unspecified chronic kidney disease: Secondary | ICD-10-CM

## 2021-09-05 ENCOUNTER — Other Ambulatory Visit: Payer: Self-pay | Admitting: *Deleted

## 2021-09-05 NOTE — Patient Outreach (Signed)
[place] [facility]) Care Management ? ?09/05/2021 ? ?David Burton ?02-10-1946 ?[id number] ? ?RN Health Coach attempted follow up outreach call to patient.  Patient was unavailable. HIPPA compliance voicemail message left with return callback number. ? ?Plan: ?RN will call patient again within 30 days. ? ?Johny Shock BSN RN ?[place] Management ?[phone] ? ? ?

## 2021-09-06 NOTE — Progress Notes (Signed)
Called pt, no answer. Left vm requesting call back?

## 2021-09-10 ENCOUNTER — Other Ambulatory Visit: Payer: Medicare Other

## 2021-09-10 DIAGNOSIS — E1142 Type 2 diabetes mellitus with diabetic polyneuropathy: Secondary | ICD-10-CM

## 2021-09-10 DIAGNOSIS — I129 Hypertensive chronic kidney disease with stage 1 through stage 4 chronic kidney disease, or unspecified chronic kidney disease: Secondary | ICD-10-CM

## 2021-09-10 DIAGNOSIS — E039 Hypothyroidism, unspecified: Secondary | ICD-10-CM

## 2021-09-10 DIAGNOSIS — E7800 Pure hypercholesterolemia, unspecified: Secondary | ICD-10-CM

## 2021-09-10 NOTE — Progress Notes (Signed)
Called and spoke to pt, pt voiced understanding.

## 2021-09-14 ENCOUNTER — Ambulatory Visit: Payer: Medicare Other | Admitting: Family

## 2021-09-17 ENCOUNTER — Other Ambulatory Visit: Payer: Self-pay | Admitting: Family Medicine

## 2021-09-17 ENCOUNTER — Other Ambulatory Visit (HOSPITAL_COMMUNITY): Payer: Self-pay | Admitting: Family Medicine

## 2021-09-17 DIAGNOSIS — M5432 Sciatica, left side: Secondary | ICD-10-CM

## 2021-09-24 ENCOUNTER — Other Ambulatory Visit: Payer: Self-pay | Admitting: Cardiology

## 2021-09-24 DIAGNOSIS — I5032 Chronic diastolic (congestive) heart failure: Secondary | ICD-10-CM

## 2021-09-26 LAB — BASIC METABOLIC PANEL
BUN/Creatinine Ratio: 18 (ref 10–24)
BUN: 33 mg/dL — ABNORMAL HIGH (ref 8–27)
CO2: 17 mmol/L — ABNORMAL LOW (ref 20–29)
Calcium: 9 mg/dL (ref 8.6–10.2)
Chloride: 110 mmol/L — ABNORMAL HIGH (ref 96–106)
Creatinine, Ser: 1.86 mg/dL — ABNORMAL HIGH (ref 0.76–1.27)
Glucose: 166 mg/dL — ABNORMAL HIGH (ref 70–99)
Potassium: 4.9 mmol/L (ref 3.5–5.2)
Sodium: 141 mmol/L (ref 134–144)
eGFR: 38 mL/min/{1.73_m2} — ABNORMAL LOW (ref >59)

## 2021-09-26 LAB — MAGNESIUM: Magnesium: 2 mg/dL (ref 1.6–2.3)

## 2021-09-26 LAB — PRO B NATRIURETIC PEPTIDE: NT-Pro BNP: 645 pg/mL — ABNORMAL HIGH (ref 0–376)

## 2021-09-27 ENCOUNTER — Ambulatory Visit (HOSPITAL_COMMUNITY)
Admission: RE | Admit: 2021-09-27 | Discharge: 2021-09-27 | Disposition: A | Discharge status: Home / Self Care | Payer: Medicare Other | Source: Ambulatory Visit | Attending: Family Medicine | Admitting: Family Medicine

## 2021-09-27 DIAGNOSIS — M5432 Sciatica, left side: Secondary | ICD-10-CM | POA: Insufficient documentation

## 2021-09-28 ENCOUNTER — Telehealth: Payer: Self-pay | Admitting: Pharmacist

## 2021-09-28 DIAGNOSIS — I5032 Chronic diastolic (congestive) heart failure: Secondary | ICD-10-CM

## 2021-09-28 NOTE — Telephone Encounter (Signed)
-----   Message from [place], [place] sent at 09/27/2021  7:14 PM EDT ----- ?Please call and find out how the patient is doing clinically. ?Lets review his PCM data. ?Renal function slightly up trended with discontinuation of losartan and initiation of Entresto, which is anticipated. ?Further recommendations to follow. ? ?[name], DO, FACC ?

## 2021-09-28 NOTE — Progress Notes (Signed)
Called and reviewed lab results with pt and pt's daughter. Pt reports that he is tolerating new start entresto 24/26 mg without significant complains. Confirmed pt stopped taking losartan prior to starting entresto. Notes that his lower extremity concerns continues to improve. No significant changes in his SOB/dyspnea concerns. Able to walk at the grocery store with his daughter without significant discomfort or changes in his SOB/dyspnea complains.  ? ?Pt notes to currently still taking lasix 40 mg BID and using Advil 2-3 tablets a day to manage persistent back pain concerns. Currently not enrolled in remote weight or BP monitoring, but pt to come by the office to pick up a BP monitor in the near future. Denies any recent complains of lightheadedness, dizziness, changing his lower extremity swelling concerns. ? ?Recommended pt to decreased lasix 40 mg BID to to Qday, stopping daily advil use and rechecking his labs in 1 week. May consider further titrating entresto dose if renal function improves at next check. Pt to follow up with his pain management specialist in the meantime as well if the back pain complains continues to persist or worsen after stopping Advil.

## 2021-10-01 ENCOUNTER — Other Ambulatory Visit: Payer: Self-pay

## 2021-10-01 DIAGNOSIS — Z951 Presence of aortocoronary bypass graft: Secondary | ICD-10-CM

## 2021-10-01 DIAGNOSIS — Z8673 Personal history of transient ischemic attack (TIA), and cerebral infarction without residual deficits: Secondary | ICD-10-CM

## 2021-10-01 DIAGNOSIS — E7800 Pure hypercholesterolemia, unspecified: Secondary | ICD-10-CM

## 2021-10-01 DIAGNOSIS — I2510 Atherosclerotic heart disease of native coronary artery without angina pectoris: Secondary | ICD-10-CM

## 2021-10-01 MED ORDER — REPATHA 140 MG/ML ~~LOC~~ SOSY: 140.0000 mg | PREFILLED_SYRINGE | SUBCUTANEOUS | 3 refills | Status: DC

## 2021-10-05 ENCOUNTER — Other Ambulatory Visit: Payer: Self-pay | Admitting: *Deleted

## 2021-10-05 ENCOUNTER — Other Ambulatory Visit: Payer: Self-pay | Admitting: Pharmacist

## 2021-10-05 DIAGNOSIS — I5032 Chronic diastolic (congestive) heart failure: Secondary | ICD-10-CM

## 2021-10-05 NOTE — Patient Outreach (Signed)
[facility]) Care Management ?RN Health Coach Note ? ? ?10/05/2021 ?Name:  David Burton MRN:  [id number] DOB:  Mar 18, 1946 ? ?Summary: ?Patient fasting blood sugar 130. His A1C is 8.5. Patient is walking for exercise. He takes the bus for transportation.  ? ?Recommendations/Changes made from today's visit: ?Recommended patient getting a pedometer and counting steps ?Exercise routinely ?Dietary adherence ?Medication adherence ? ? ?Subjective: ?David Burton is an 76 y.o. year old male who is a primary patient of [name], [name], NP. The care management team was consulted for assistance with care management and/or care coordination needs.   ? ?RN Health Coach completed Telephone Visit today.  ? ?Objective: ? ?Medications Reviewed Today   ? ? Reviewed by Marzetta Board, DPM (Physician) on 08/15/21 at [phone]  Med List Status: <None>  ? ?Medication Order Taking? Sig Documenting Provider Last Dose Status Informant  ?amLODipine (NORVASC) 10 MG tablet [id number] No Take 10 mg by mouth daily. [provider] Taking Active   ?atorvastatin (LIPITOR) 20 MG tablet [id number] No TAKE 1 TABLET(20 MG) BY MOUTH DAILY [name], [name], NP Taking Active   ?BD PEN NEEDLE NANO 2ND GEN 32G X 4 MM MISC [id number] No USE 1 PEN THREE TIMES DAILY AS DIRECTED [name], [name], NP Taking Active Self  ?clopidogrel (PLAVIX) 75 MG tablet [id number] No TAKE 1 TABLET(75 MG) BY MOUTH DAILY Tolia, Sunit, DO Taking Active   ?Continuous Blood Gluc Sensor (DEXCOM G6 SENSOR) MISC [id number] No Use to test blood sugar once daily. Dx: E11.42 [name], [name], NP Taking Active   ?Continuous Blood Gluc Transmit (DEXCOM G6 TRANSMITTER) MISC [id number] No Use to test blood sugar once daily. Dx: E11.42 [name], [name], NP Taking Active   ?dapagliflozin propanediol (FARXIGA) 10 MG TABS tablet [id number] No Take 1 tablet (10 mg total) by mouth daily before breakfast. Terri Skains, Sunit, DO Taking Active   ?ezetimibe (ZETIA) 10 MG tablet [id number] No  TAKE 1 TABLET(10 MG) BY MOUTH DAILY Tolia, Sunit, DO Taking Active   ?furosemide (LASIX) 40 MG tablet [id number] No Take 40 mg by mouth every morning. [provider] Taking Active   ?gabapentin (NEURONTIN) 100 MG capsule [id number] No Take 100 mg by mouth 3 (three) times daily. [provider] Taking Active   ?glucose blood (ACCU-CHEK AVIVA PLUS) test strip [id number] No Use to test blood sugar 3 times daily. Dx: E11.42 [name], [name], NP Taking Active Self  ?HYDROcodone-acetaminophen (NORCO) 10-325 MG tablet [id number] No Take 1 tablet by mouth 3 (three) times daily as needed. [name], [name], NP Taking Active   ?insulin detemir (LEVEMIR) 100 UNIT/ML FlexPen [id number] No Inject 22 Units into the skin at bedtime. [name], [name], NP Taking Active   ?insulin lispro (HUMALOG KWIKPEN) 100 UNIT/ML KwikPen [id number] No Inject 8-10 units into the skin per sliding scale three times daily with meals. [name], [name], NP Taking Active Self  ?isosorbide mononitrate (IMDUR) 60 MG 24 hr tablet [id number] No TAKE 1 TABLET(60 MG) BY MOUTH DAILY [name], [name], NP Taking Active   ?losartan (COZAAR) 25 MG tablet [id number] No Take 1 tablet (25 mg total) by mouth daily. [name], [name], NP Taking Active   ?metoprolol tartrate (LOPRESSOR) 25 MG tablet [id number] No TAKE 1 TABLET(25 MG) BY MOUTH TWICE DAILY [name], [name], NP Taking Active   ?nitroGLYCERIN (NITROSTAT) 0.4 MG SL tablet [id number] No Dissolve one tablet under tongue as needed for chest pain. [name], [name], NP Taking Active Self  ?NOVOLOG  FLEXPEN 100 UNIT/ML FlexPen [id number] No  [provider] Taking Active   ?REPATHA 140 MG/ML SOSY [id number] No Inject 140 mg into the skin every 14 (fourteen) days. [name], DO Taking Active   ?Vitamin D, Ergocalciferol, (DRISDOL) 1.25 MG (50000 UNIT) CAPS capsule [id number] No Take 50,000 Units by mouth once a week. [provider] Taking Active   ?Med List Note (Mares, [name] 03/16/21  1143):  ?  ? ?  ?  ? ?  ? ? ? ?SDOH:  (Social Determinants of Health) assessments and interventions performed:  ?SDOH Interventions   ? ?Flowsheet Row Most Recent Value  ?SDOH Interventions   ?Food Insecurity Interventions Intervention Not Indicated  ?Housing Interventions Intervention Not Indicated  ?Transportation Interventions Intervention Not Indicated  ? ?  ? ? ?Care Plan ? ?Review of patient past medical history, allergies, medications, health status, including review of consultants reports, laboratory and other test data, was performed as part of comprehensive evaluation for care management services.  ? ?Care Plan : General Plan of Care (Adult)  ?Updates made by Linc Renne, Eppie Gibson, RN since 10/05/2021 12:00 AM  ?  ? ?Problem: Quality of Life (General Plan of Care) Resolved 10/05/2021  ?Note:   ?[id number] Resolving due to duplicate goal ? ?  ? ?Problem: Health Promotion or Disease Self-Management (General Plan of Care) Resolved 10/05/2021  ?Note:   ?[id number] Resolving due to duplicate goal ? ?  ? ?Care Plan : RN Care Manager Plan of Care  ?Updates made by Cisco Kindt, Eppie Gibson, RN since 10/05/2021 12:00 AM  ?  ? ?Problem: Knowledge Deficit Related to Diabetes and Care Coordination Needs   ?Priority: High  ?  ? ?Long-Range Goal: Development Plan of Care for Management of Diabetes   ?Start Date: 05/16/2021  ?Expected End Date: 06/01/2022  ?Priority: High  ?Note:   ?Current Barriers:  ?Knowledge Deficits related to plan of care for management of DMII  ? ?RNCM Clinical Goal(s):  ?Patient will verbalize understanding of plan for management of DMII as evidenced by continuation of monitoring blood sugars and adhering to diabetic diet  through collaboration with RN Care manager, provider, and care team.  ? ?Interventions: ?Inter-disciplinary care team collaboration (see longitudinal plan of care) ?Evaluation of current treatment plan related to  self management and patient's adherence to plan as established by  provider ? ? ? ?Patient Goals/Self-Care Activities: ?Take medications as prescribed   ?Attend all scheduled provider appointments ?Call pharmacy for medication refills 3-7 days in advance of running out of medications ?Attend church or other social activities ?Perform all self care activities independently  ?Perform IADL's (shopping, preparing meals, housekeeping, managing finances) independently ?Call provider office for new concerns or questions  ?call the Suicide and Crisis Lifeline: 988 if experiencing a Mental Health or [place]  ?schedule appointment with eye doctor ?check blood sugar at prescribed times: before meals and at bedtime and when you have symptoms of low or high blood sugar ?check feet daily for cuts, sores or redness ?take the blood sugar meter to all doctor visits ?trim toenails straight across ?drink 6 to 8 glasses of water each day ?manage portion size ?Patient will get dentures first of the year ? [id number] Fasting blood sugar 130.  His A1C is 8.5. Per patient he exercises by walking. RN discussed getting a pedometer to count steps. He rides a bus for transportation. Per patient his appetite is good. RN discussed healthy eating and food preparation.  ?  ?  ? ?Plan:  Telephone follow up appointment with care management team member scheduled for:  January 22, 2022 ?The patient has been provided with contact information for the care management team and has been advised to call with any health related questions or concerns.  ? ?Johny Shock BSN RN ?[place] [facility] ?[phone] ? ? ? ?  ?

## 2021-10-05 NOTE — Patient Instructions (Signed)
Visit Information ? ?Thank you for taking time to visit with me today. Please don't hesitate to contact me if I can be of assistance to you before our next scheduled telephone appointment. ? ?Following are the goals we discussed today:  ?Current Barriers:  ?Knowledge Deficits related to plan of care for management of DMII  ? ?RNCM Clinical Goal(s):  ?Patient will verbalize understanding of plan for management of DMII as evidenced by continuation of monitoring blood sugars and adhering to diabetic diet through collaboration with RN Care manager, provider, and care team.  ? ?Interventions: ?Inter-disciplinary care team collaboration (see longitudinal plan of care) ?Evaluation of current treatment plan related to  self management and patient's adherence to plan as established by provider ? ?Patient Goals/Self-Care Activities: ?Take medications as prescribed   ?Attend all scheduled provider appointments ?Call pharmacy for medication refills 3-7 days in advance of running out of medications ?Attend church or other social activities ?Perform all self care activities independently  ?Perform IADL's (shopping, preparing meals, housekeeping, managing finances) independently ?Call provider office for new concerns or questions  ?call the Suicide and Crisis Lifeline: 988 if experiencing a Mental Health or [place]  ?schedule appointment with eye doctor ?check blood sugar at prescribed times: before meals and at bedtime and when you have symptoms of low or high blood sugar ?check feet daily for cuts, sores or redness ?take the blood sugar meter to all doctor visits ?trim toenails straight across ?drink 6 to 8 glasses of water each day ?manage portion size ?Patient will get dentures first of the year ? [id number] Fasting blood sugar 130.  His A1C is 8.5. Per patient he exercises by walking. RN discussed getting a pedometer to count steps. He rides a bus for transportation. Per patient his appetite is good. RN  discussed healthy eating and food preparation.  ? ?Our next appointment is by telephone on January 22, 2022 ? ?Please call Johny Shock RN [phone]  if you need to cancel or reschedule your appointment.  ? ?Please call the Suicide and Crisis Lifeline: 988 if you are experiencing a Mental Health or [place] or need someone to talk to. ? ?The patient verbalized understanding of instructions, educational materials, and care plan provided today and agreed to receive a mailed copy of patient instructions, educational materials, and care plan.  ? ?Telephone follow up appointment with care management team member scheduled for: ?The patient has been provided with contact information for the care management team and has been advised to call with any health related questions or concerns.  ? ?SIGNATURE ? ?Johny Shock BSN RN ?[facility] ?[phone] ? ? ?

## 2021-10-05 NOTE — Progress Notes (Signed)
Called and followed up with pt. Pt reports that he still has mild swelling in his ankles, but continues to trend down. Pt weight this morning of 266 lbs. Pt verified that he decreased his lasix dose to 40 mg daily and decrease his Advil use. Pt reports he still has significant back pain and knee pain and had recent MRI done through PCP that notes significant stenonsis that might be contributing to his pain. Pt reports that he hasnt followed up with his labs yet is planning on completing labs next Monday.  ?

## 2021-10-11 LAB — BASIC METABOLIC PANEL
BUN/Creatinine Ratio: 15 (ref 10–24)
BUN: 34 mg/dL — ABNORMAL HIGH (ref 8–27)
CO2: 20 mmol/L (ref 20–29)
Calcium: 9.9 mg/dL (ref 8.6–10.2)
Chloride: 98 mmol/L (ref 96–106)
Creatinine, Ser: 2.2 mg/dL — ABNORMAL HIGH (ref 0.76–1.27)
Glucose: 216 mg/dL — ABNORMAL HIGH (ref 70–99)
Potassium: 4.2 mmol/L (ref 3.5–5.2)
Sodium: 138 mmol/L (ref 134–144)
eGFR: 31 mL/min/{1.73_m2} — ABNORMAL LOW (ref >59)

## 2021-10-11 LAB — MAGNESIUM: Magnesium: 2.3 mg/dL (ref 1.6–2.3)

## 2021-10-11 LAB — PRO B NATRIURETIC PEPTIDE: NT-Pro BNP: 153 pg/mL (ref 0–376)

## 2021-10-11 NOTE — Progress Notes (Signed)
Called and LVM for pt x2. Waiting for pt to call back

## 2021-10-11 NOTE — Progress Notes (Signed)
Lets review his medication.  ? ?Dr.Asani Mcburney

## 2021-10-12 ENCOUNTER — Telehealth: Payer: Self-pay | Admitting: Pharmacist

## 2021-10-12 DIAGNOSIS — I5032 Chronic diastolic (congestive) heart failure: Secondary | ICD-10-CM

## 2021-10-12 NOTE — Progress Notes (Signed)
Called and discussed lab results with pt. Renal function continues to trend up despite pt decreasing his lasix dose from 40 mg BID to Qday 2 weeks ago.  ? ?Pt notes that his back pain symptoms have progressively gotten worse in the meantime and pt went back to taking daily Advil to help manage his pain. Pt has been taking ~800 mg Ibuprofen/day to help manage the pain. Previously was trying to wean himself off, but pain currently poorly managed on Norco 10/325 mg and PRN tylenol. Pt planing on reaching out to his PCP and orthopedic for further pain management recommendation. Recommended pt discontinue future Advil use if possible in the meantime. ? ?Pt currently on Entresto 24/26 mg, farxiga 10 mg, and lasix 40 mg qday. Pt notes that his lower extremity swelling have continued to improve since starting lasix, and is now close to his baseline. Due to worsening renal function and improved swelling concerns, would recommend discontinuing lasix and farxiga for now. Recommended pt reach out to his PCP to coordinate referral with a nephrologist in the near future. Pt to get repeat labs in 1 week.

## 2021-10-12 NOTE — Telephone Encounter (Signed)
-----   Message from Alysia Penna, [facility] sent at 10/11/2021  4:37 PM EDT ----- ?Called and LVM for pt x2. Waiting for pt to call back ?

## 2021-10-15 ENCOUNTER — Encounter: Payer: Self-pay | Admitting: Vascular Surgery

## 2021-10-19 ENCOUNTER — Other Ambulatory Visit: Payer: Self-pay | Admitting: Cardiology

## 2021-10-20 LAB — BASIC METABOLIC PANEL
BUN/Creatinine Ratio: 18 (ref 10–24)
BUN: 29 mg/dL — ABNORMAL HIGH (ref 8–27)
CO2: 20 mmol/L (ref 20–29)
Calcium: 9.6 mg/dL (ref 8.6–10.2)
Chloride: 101 mmol/L (ref 96–106)
Creatinine, Ser: 1.64 mg/dL — ABNORMAL HIGH (ref 0.76–1.27)
Glucose: 193 mg/dL — ABNORMAL HIGH (ref 70–99)
Potassium: 4.5 mmol/L (ref 3.5–5.2)
Sodium: 139 mmol/L (ref 134–144)
eGFR: 44 mL/min/{1.73_m2} — ABNORMAL LOW (ref >59)

## 2021-10-20 LAB — PRO B NATRIURETIC PEPTIDE: NT-Pro BNP: 284 pg/mL (ref 0–376)

## 2021-10-24 ENCOUNTER — Other Ambulatory Visit: Payer: Self-pay | Admitting: Cardiology

## 2021-10-24 DIAGNOSIS — I5032 Chronic diastolic (congestive) heart failure: Secondary | ICD-10-CM

## 2021-11-02 NOTE — Progress Notes (Signed)
Spoke to patient he is aware of results.

## 2021-11-02 NOTE — Progress Notes (Signed)
Tried calling patient no answer left a vm

## 2021-11-18 ENCOUNTER — Other Ambulatory Visit: Payer: Self-pay | Admitting: Cardiology

## 2021-11-18 ENCOUNTER — Other Ambulatory Visit: Payer: Self-pay | Admitting: Family

## 2021-11-18 DIAGNOSIS — Z8673 Personal history of transient ischemic attack (TIA), and cerebral infarction without residual deficits: Secondary | ICD-10-CM

## 2021-11-18 DIAGNOSIS — N183 Chronic kidney disease, stage 3 unspecified: Secondary | ICD-10-CM

## 2021-11-18 DIAGNOSIS — I5032 Chronic diastolic (congestive) heart failure: Secondary | ICD-10-CM

## 2021-11-18 DIAGNOSIS — Z955 Presence of coronary angioplasty implant and graft: Secondary | ICD-10-CM

## 2021-11-18 DIAGNOSIS — I129 Hypertensive chronic kidney disease with stage 1 through stage 4 chronic kidney disease, or unspecified chronic kidney disease: Secondary | ICD-10-CM

## 2021-11-18 DIAGNOSIS — I5033 Acute on chronic diastolic (congestive) heart failure: Secondary | ICD-10-CM

## 2021-11-19 ENCOUNTER — Ambulatory Visit: Payer: Medicare Other | Admitting: Cardiology

## 2021-11-19 ENCOUNTER — Encounter: Payer: Self-pay | Admitting: Cardiology

## 2021-11-19 VITALS — BP 146/76 | HR 65 | Temp 97.5°F | Resp 16 | Ht 67.0 in | Wt 270.4 lb

## 2021-11-19 DIAGNOSIS — E7800 Pure hypercholesterolemia, unspecified: Secondary | ICD-10-CM

## 2021-11-19 DIAGNOSIS — I5032 Chronic diastolic (congestive) heart failure: Secondary | ICD-10-CM

## 2021-11-19 DIAGNOSIS — Z955 Presence of coronary angioplasty implant and graft: Secondary | ICD-10-CM

## 2021-11-19 DIAGNOSIS — Z87891 Personal history of nicotine dependence: Secondary | ICD-10-CM

## 2021-11-19 DIAGNOSIS — I2510 Atherosclerotic heart disease of native coronary artery without angina pectoris: Secondary | ICD-10-CM

## 2021-11-19 DIAGNOSIS — Z951 Presence of aortocoronary bypass graft: Secondary | ICD-10-CM

## 2021-11-19 DIAGNOSIS — Z794 Long term (current) use of insulin: Secondary | ICD-10-CM

## 2021-11-19 NOTE — Progress Notes (Unsigned)
David Burton Date of Birth: 12-27-1945 MRN: [id number] Primary Care Provider:[name], [name], NP Former Cardiology Providers: Dr. Adrian Prows, Jeri Lager, APRN, FNP-C Primary Cardiologist: [name], DO, [facility] (established care 01/11/2020)  Date: 11/19/21 Last Office Visit: 08/13/2021  Chief Complaint  Patient presents with   Congestive Heart Failure   Follow-up    3 months    HPI  David Burton is a 76 y.o.  male whose  past medical history and cardiovascular risk factors include: Chronic HFpEF, NSTEMI in Dec 2018, s/p CABG x 4 on 05/08/2017 (LIMA to LAD, SVG to PDA, SVG to Diagonal, SVG to Ramus. Surgery done at [place]), diabetes mellitus type 2 with peripheral neuropathy, hypertension, hypercholesterolemia, CVA (09/2019), advanced age.  Patient is being followed by the practice for management of his CAD and HFpEF.  Patient presents today for 90-monthfollow-up visit for management of his underlying CAD and HFpEF.  Since last office visit he has not had any anginal discomfort or heart failure symptoms.  Since last office visit has been educated on the importance of reducing his dietary intake of salt which consisted significant amount of TV dinners, canned chicken noodle soup, etc.  He was uptitrated on GDMT and recent labs from May 2023 notes NT proBNP within normal limits at 294.  He has had elevated serum creatinine levels secondary to using significant amount of NSAIDs due to arthritic pain.  Since that he has reduced intake of NSAIDs serum creatinine has improved as of May 2023.  Patient states that he is planned to having back injections with pain management and would like to hold his aspirin and Plavix.  He does not know additional details with regards to who his providers, the type of surgery/procedure, and tentative date of procedure.  I have asked him to provide [place] details prior to providing final recommendations.  FUNCTIONAL STATUS: He walks atleast 0.5  miles per day.    ALLERGIES: No Known Allergies  MEDICATION LIST PRIOR TO VISIT: Current Outpatient Medications on File Prior to Visit  Medication Sig Dispense Refill   amLODipine (NORVASC) 10 MG tablet TAKE 1 TABLET(10 MG) BY MOUTH DAILY 90 tablet 0   aspirin 81 MG chewable tablet Chew 1 tablet by mouth daily.     atorvastatin (LIPITOR) 20 MG tablet TAKE 1 TABLET(20 MG) BY MOUTH DAILY 90 tablet 1   BD PEN NEEDLE NANO 2ND GEN 32G X 4 MM MISC USE 1 PEN THREE TIMES DAILY AS DIRECTED 100 each 3   clopidogrel (PLAVIX) 75 MG tablet TAKE 1 TABLET(75 MG) BY MOUTH DAILY 90 tablet 0   Continuous Blood Gluc Sensor (DEXCOM G6 SENSOR) MISC Use to test blood sugar once daily. Dx: E11.42 9 each 3   Continuous Blood Gluc Transmit (DEXCOM G6 TRANSMITTER) MISC Use to test blood sugar once daily. Dx: E11.42 1 each 0   ENTRESTO 24-26 MG TAKE 1 TABLET BY MOUTH TWICE DAILY 60 tablet 0   ezetimibe (ZETIA) 10 MG tablet TAKE 1 TABLET(10 MG) BY MOUTH DAILY 90 tablet 3   FARXIGA 10 MG TABS tablet TAKE 1 TABLET(10 MG) BY MOUTH DAILY BEFORE AND BREAKFAST 90 tablet 0   furosemide (LASIX) 40 MG tablet Take 40 mg by mouth every morning.     gabapentin (NEURONTIN) 100 MG capsule Take 100 mg by mouth 3 (three) times daily.     glucose blood (ACCU-CHEK AVIVA PLUS) test strip Use to test blood sugar 3 times daily. Dx: E11.42 300 each 1  HYDROcodone-acetaminophen (NORCO) 10-325 MG tablet Take 1 tablet by mouth 3 (three) times daily as needed. 90 tablet 0   insulin detemir (LEVEMIR) 100 UNIT/ML FlexPen Inject 22 Units into the skin at bedtime. 30 mL 1   insulin lispro (HUMALOG KWIKPEN) 100 UNIT/ML KwikPen Inject 8-10 units into the skin per sliding scale three times daily with meals. 45 mL 3   isosorbide mononitrate (IMDUR) 60 MG 24 hr tablet TAKE 1 TABLET(60 MG) BY MOUTH DAILY 90 tablet 1   metoprolol tartrate (LOPRESSOR) 25 MG tablet TAKE 1 TABLET(25 MG) BY MOUTH TWICE DAILY 180 tablet 0   nitroGLYCERIN (NITROSTAT) 0.4 MG  SL tablet Dissolve one tablet under tongue as needed for chest pain. 30 tablet 3   NOVOLOG FLEXPEN 100 UNIT/ML FlexPen      pregabalin (LYRICA) 100 MG capsule Take 100 mg by mouth daily.     REPATHA 140 MG/ML SOSY Inject 140 mg into the skin every 14 (fourteen) days. 6 mL 3   tamsulosin (FLOMAX) 0.4 MG CAPS capsule Take 1 capsule by mouth daily.     TRULICITY 3.23 FT/7.3UK SOPN Inject 0.75 mg into the skin once a week.     Vitamin D, Ergocalciferol, (DRISDOL) 1.25 MG (50000 UNIT) CAPS capsule Take 50,000 Units by mouth once a week.     No current facility-administered medications on file prior to visit.    PAST MEDICAL HISTORY: Past Medical History:  Diagnosis Date   Bell palsy    CKD (chronic kidney disease), stage III ([place]) 12/16/2018   Coronary artery disease involving native coronary artery of native heart with unstable angina pectoris ([place]) 12/16/2018   GERD (gastroesophageal reflux disease)    Hypertension    Hypertension with heart disease 12/16/2018   Myocardial infarction ([place])    Pure hypercholesterolemia 12/16/2018   Stroke ([place])    Type 2 diabetes, controlled, with peripheral neuropathy ([place]) 12/16/2018    PAST SURGICAL HISTORY: Past Surgical History:  Procedure Laterality Date   CARDIAC SURGERY     COLONOSCOPY WITH PROPOFOL N/A 01/02/2018   Procedure: COLONOSCOPY WITH PROPOFOL;  Surgeon: Carol Ada, MD;  Location: [facility];  Service: Endoscopy;  Laterality: N/A;   CORONARY STENT INTERVENTION N/A 01/26/2019   Procedure: CORONARY STENT INTERVENTION;  Surgeon: Adrian Prows, MD;  Location: [place] [facility];  Service: Cardiovascular;  Laterality: N/A;   LEFT HEART CATH AND CORS/GRAFTS ANGIOGRAPHY N/A 01/05/2019   Procedure: LEFT HEART CATH AND CORS/GRAFTS ANGIOGRAPHY;  Surgeon: Adrian Prows, MD;  Location: [place] [facility];  Service: Cardiovascular;  Laterality: N/A;   POLYPECTOMY  01/02/2018   Procedure: POLYPECTOMY;  Surgeon: Carol Ada, MD;  Location: [facility];   Service: Endoscopy;;   stents      FAMILY HISTORY: The patient's family history includes Cancer in his mother; Heart attack in his father.   SOCIAL HISTORY:  The patient  reports that he quit smoking about 18 years ago. His smoking use included cigarettes. He has a 5.00 pack-year smoking history. He has never used smokeless tobacco. He reports that he does not currently use alcohol. He reports that he does not use drugs.  Review of Systems  Cardiovascular:  Negative for chest pain, claudication, dyspnea on exertion, leg swelling, near-syncope, orthopnea, palpitations, paroxysmal nocturnal dyspnea and syncope.  Respiratory:  Negative for shortness of breath.    PHYSICAL EXAM:    11/19/2021    1:11 PM 08/13/2021    2:21 PM 07/05/2021    2:32 PM  Vitals with BMI  Height '5\' 7"'$  '5\' 7"'$  '5\' 7"'$   Weight 270 lbs 6 oz 270 lbs 13 oz 262 lbs  BMI 42.34 40.8 14.48  Systolic 185 631 497  Diastolic 76 62 69  Pulse 65 62 59   CONSTITUTIONAL: Appears older than stated age, hemodynamically stable, no acute distress  SKIN: Skin is warm and dry. No rash noted. No cyanosis. No pallor. No jaundice HEAD: Normocephalic and atraumatic.  EYES: No scleral icterus MOUTH/THROAT: Moist oral membranes.  NECK: No JVD present. No thyromegaly noted. No carotid bruits  LYMPHATIC: No visible cervical adenopathy.  CHEST Normal respiratory effort. No intercostal retractions  LUNGS: Clear to auscultation bilaterally.  No stridor. No wheezes. No rales.  CARDIOVASCULAR: Regular rate and rhythm, positive S1-S2, no murmurs rubs or gallops appreciated. ABDOMINAL: Obese, soft, nontender, nondistended, positive bowel sounds in all 4 quadrants no apparent ascites.  EXTREMITIES: Left greater than right +1 pitting edema, warm to touch, faint DP and PT pulses. HEMATOLOGIC: No significant bruising NEUROLOGIC: Oriented to person, place, and time. Nonfocal. Normal muscle tone.  PSYCHIATRIC: Normal mood and affect. Normal behavior.  Cooperative  RADIOLOGY: MR Brain w/o contrast:  IMPRESSION: 1. Mildly motion degraded exam. 2. Confirmed 17 mm acute/early subacute infarct within the right pons. 3. Background mild chronic small vessel ischemic disease within the cerebral white matter and pons. Tiny chronic lacunar infarct within the left cerebellum. 4. Mild generalized parenchymal atrophy. 5. Mild paranasal sinus mucosal thickening. Trace left mastoid effusion.  CARDIAC DATABASE: Coronary artery bypass grafting: s/p CABG x 4 on 05/08/2017 (LIMA to LAD, SVG to PDA, SVG to Diagonal, SVG to Ramus.  EKG: 11/19/2021: NSR, 65 bpm, consider old inferior infarct, without underlying injury pattern.  Echocardiogram: 09/17/2019: LVEF 55 to 60%, no regional wall motion arise, grade 2 diastolic impairment, elevated LVEDP, right ventricular size mildly dilated, biatrial dilatation, trivial MR, aortic valve sclerosis without stenosis.  Stress Testing:  Lexiscan myoview stress test 10/10/2017: 1. Lexiscan stress test was performed. Exercise capacity was not assessed. Stress symptoms included dizziness. Normal blood pressure. The resting electrocardiogram demonstrated normal sinus rhythm, normal resting conduction, no resting arrhythmias and normal rest repolarization. Stress EKG is non diagnostic for ischemia as it is a pharmacologic stress. 2. The overall quality of the study is good. Left ventricular cavity is noted to be normal on the rest and stress studies. Gated SPECT images reveal normal myocardial thickening and wall motion, except mild inferior hypokinesis. The left ventricular ejection fraction was calculated or visually estimated to be 53%. SPECT stress images demonstrate medium size, moderate intensity perfusion defect in basal to apical inferior, inferolateral myocardium. SPECT rest images demonstrate medium size, mild intensity perfusion defect in basal to apical inferior myocardium. This suggests inferior infarct with  periinfarct mild ischemia, as well as inferolateral ischemia. (LCx/PDA territory). 3. Intermediate risk study.  Heart Catheterization: Coronary angiogram 01/05/2019: Normal LVEDP, severe native vessel disease.  High-grade proximal RCA 90 to 95% stenosis followed by 70 to 80% stenosis in the in-stent restenotic proximal segment.  Large RCA.  SVG to RCA could not be visualized in spite of multiple catheter attempts, and presumed occluded. Left main is large and mildly diseased but patent.  LAD is occluded in the midsegment after the origin of D1, which has ostial 90% stenosis.  SVG to D1 is patent.  LIMA to LAD is patent. Ramus intermediate: Large vessel, mild to moderate calcific 80 to 85% stenosis in the proximal segment.  SVG to ramus intermediate is occluded. Circumflex: High-grade 99%/subtotal occlusion  in the ostium.  Moderate to large sized vessel.  Small marginals.  Coronary intervention 01/26/2019: Successful PTCA and stenting of the proximal mid and mid to distal RCA with implantation of 2 overlapping 3.0 x 38 mm Promus Synergy DES, stenosis reduced from 90% to 0% with TIMI-3 to TIMI-3 flow.     Recommendation: With medical high-grade ramus intermediate and circumflex stenosis, he has excellent brisk flow there and ischemia was in the inferior wall, however if he still persist to have recurrence of angina pectoris we will schedule him for complex intervention to the left coronary system.  In view of his underlying comorbidity, would recommend medical therapy  Carotid duplex: 09/17/2019: Right Carotid: Velocities in the right ICA are consistent with a 1-39% stenosis.  Left Carotid: Velocities in the left ICA are consistent with a 1-39% stenosis.  Vertebrals: Left vertebral artery demonstrates antegrade flow. Right vertebral artery was not visualized  14 - day [facility] Ambulatory Telemetry.  Dominant rhythm normal sinus. Heart rate 59-156 bpm.  Avg HR 85 bpm. No atrial  fibrillation, ventricular tachycardia, high grade AV block, pauses (3 seconds or longer). Total ventricular and supraventricular ectopic burden <1%. Two episode of supraventricular tachycardia. Longest and fastest episode was 17 beats at maximum HR of 156 bpm. Patient triggered events: 0.  LABORATORY DATA:    Latest Ref Rng & Units 12/13/2020    1:28 PM 09/15/2020    1:47 AM 09/14/2020    4:14 AM  CBC  WBC 3.8 - 10.8 Thousand/uL 7.8  10.5  8.9   Hemoglobin 13.2 - 17.1 g/dL 13.6  12.4  12.0   Hematocrit 38.5 - 50.0 % 41.3  35.0  33.5   Platelets 140 - 400 Thousand/uL 141  178  156        Latest Ref Rng & Units 10/19/2021   10:05 AM 10/10/2021   10:25 AM 09/25/2021   10:39 AM  CMP  Glucose 70 - 99 mg/dL 193  216  166   BUN 8 - 27 mg/dL 29  34  33   Creatinine 0.76 - 1.27 mg/dL 1.64  2.20  1.86   Sodium 134 - 144 mmol/L 139  138  141   Potassium 3.5 - 5.2 mmol/L 4.5  4.2  4.9   Chloride 96 - 106 mmol/L 101  98  110   CO2 20 - 29 mmol/L '20  20  17   '$ Calcium 8.6 - 10.2 mg/dL 9.6  9.9  9.0    Last lipids Lab Results  Component Value Date   CHOL 80 (L) 07/10/2021   HDL 40 07/10/2021   LDLCALC 18 07/10/2021   LDLDIRECT 22 07/10/2021   TRIG 122 07/10/2021   CHOLHDL 2.1 12/13/2020    Lab Results  Component Value Date   HGBA1C 8.5 (H) 12/13/2020   HGBA1C 8.1 (H) 09/06/2020   HGBA1C 6.6 (H) 05/08/2020   No components found for: "NTPROBNP" Lab Results  Component Value Date   TSH 4.89 (H) 12/13/2020   TSH 1.63 09/06/2020   TSH 3.02 05/08/2020    Cardiac Panel (last 3 results) No results for input(s): "CKTOTAL", "CKMB", "TROPONINIHS", "RELINDX" in the last 72 hours.  IMPRESSION:    ICD-10-CM   1. Chronic heart failure with preserved ejection fraction (HFpEF) (HCC)  I50.32 EKG 12-Lead    2. Coronary artery disease involving native coronary artery of native heart without angina pectoris  I25.10     3. S/P primary angioplasty with coronary stent  Z95.5  4. S/P CABG x  4  Z95.1     5. Type 2 diabetes mellitus with other circulatory complication, with long-term current use of insulin (HCC)  E11.59    Z79.4     6. Pure hypercholesterolemia  E78.00     7. Former smoker  Z87.891         RECOMMENDATIONS: Hope Holst is a 76 y.o. male whose past medical history and cardiovascular risk factors include: NSTEMI in Dec 2018, s/p CABG x 4 on 05/08/2017 (LIMA to LAD, SVG to PDA, SVG to Diagonal, SVG to Ramus. Surgery done at [place]), diabetes mellitus type 2 with peripheral neuropathy, hypertension, hypercholesterolemia, CVA (09/2019), advanced age.  Chronic heart failure with preserved ejection fraction (HFpEF) (HCC) Stage C, NYHA class II Independently reviewed labs from May 2023 and noted above for further reference.  Coronary artery disease involving native coronary artery of native heart without angina pectoris / s/p primary angioplasty with coronary stent / S/P CABG x 4 Denies angina pectoris. No use of sublingual nitroglycerin tablets since last office visit. LDL currently at goal. Continue statin therapy, Zetia, PCSK9 inhibitor. Educated the importance of secondary prevention. He continues to exercise regularly but overall limited due to back pain.  Type 2 diabetes mellitus with other circulatory complication, with long-term current use of insulin (HCC) ***  Pure hypercholesterolemia ***  Former smoker ***   FINAL MEDICATION LIST END OF ENCOUNTER:   Current Outpatient Medications:    amLODipine (NORVASC) 10 MG tablet, TAKE 1 TABLET(10 MG) BY MOUTH DAILY, Disp: 90 tablet, Rfl: 0   aspirin 81 MG chewable tablet, Chew 1 tablet by mouth daily., Disp: , Rfl:    atorvastatin (LIPITOR) 20 MG tablet, TAKE 1 TABLET(20 MG) BY MOUTH DAILY, Disp: 90 tablet, Rfl: 1   BD PEN NEEDLE NANO 2ND GEN 32G X 4 MM MISC, USE 1 PEN THREE TIMES DAILY AS DIRECTED, Disp: 100 each, Rfl: 3   clopidogrel (PLAVIX) 75 MG tablet, TAKE 1 TABLET(75 MG) BY  MOUTH DAILY, Disp: 90 tablet, Rfl: 0   Continuous Blood Gluc Sensor (DEXCOM G6 SENSOR) MISC, Use to test blood sugar once daily. Dx: E11.42, Disp: 9 each, Rfl: 3   Continuous Blood Gluc Transmit (DEXCOM G6 TRANSMITTER) MISC, Use to test blood sugar once daily. Dx: E11.42, Disp: 1 each, Rfl: 0   ENTRESTO 24-26 MG, TAKE 1 TABLET BY MOUTH TWICE DAILY, Disp: 60 tablet, Rfl: 0   ezetimibe (ZETIA) 10 MG tablet, TAKE 1 TABLET(10 MG) BY MOUTH DAILY, Disp: 90 tablet, Rfl: 3   FARXIGA 10 MG TABS tablet, TAKE 1 TABLET(10 MG) BY MOUTH DAILY BEFORE AND BREAKFAST, Disp: 90 tablet, Rfl: 0   furosemide (LASIX) 40 MG tablet, Take 40 mg by mouth every morning., Disp: , Rfl:    gabapentin (NEURONTIN) 100 MG capsule, Take 100 mg by mouth 3 (three) times daily., Disp: , Rfl:    glucose blood (ACCU-CHEK AVIVA PLUS) test strip, Use to test blood sugar 3 times daily. Dx: E11.42, Disp: 300 each, Rfl: 1   HYDROcodone-acetaminophen (NORCO) 10-325 MG tablet, Take 1 tablet by mouth 3 (three) times daily as needed., Disp: 90 tablet, Rfl: 0   insulin detemir (LEVEMIR) 100 UNIT/ML FlexPen, Inject 22 Units into the skin at bedtime., Disp: 30 mL, Rfl: 1   insulin lispro (HUMALOG KWIKPEN) 100 UNIT/ML KwikPen, Inject 8-10 units into the skin per sliding scale three times daily with meals., Disp: 45 mL, Rfl: 3   isosorbide mononitrate (IMDUR) 60 MG  24 hr tablet, TAKE 1 TABLET(60 MG) BY MOUTH DAILY, Disp: 90 tablet, Rfl: 1   metoprolol tartrate (LOPRESSOR) 25 MG tablet, TAKE 1 TABLET(25 MG) BY MOUTH TWICE DAILY, Disp: 180 tablet, Rfl: 0   nitroGLYCERIN (NITROSTAT) 0.4 MG SL tablet, Dissolve one tablet under tongue as needed for chest pain., Disp: 30 tablet, Rfl: 3   NOVOLOG FLEXPEN 100 UNIT/ML FlexPen, , Disp: , Rfl:    pregabalin (LYRICA) 100 MG capsule, Take 100 mg by mouth daily., Disp: , Rfl:    REPATHA 140 MG/ML SOSY, Inject 140 mg into the skin every 14 (fourteen) days., Disp: 6 mL, Rfl: 3   tamsulosin (FLOMAX) 0.4 MG CAPS  capsule, Take 1 capsule by mouth daily., Disp: , Rfl:    TRULICITY 3.35 KT/6.2BW SOPN, Inject 0.75 mg into the skin once a week., Disp: , Rfl:    Vitamin D, Ergocalciferol, (DRISDOL) 1.25 MG (50000 UNIT) CAPS capsule, Take 50,000 Units by mouth once a week., Disp: , Rfl:   Orders Placed This Encounter  Procedures   EKG 12-Lead   --Continue cardiac medications as reconciled in final medication list. --No follow-ups on file. Or sooner if needed. --Continue follow-up with your primary care physician regarding the management of your other chronic comorbid conditions.  Patient's questions and concerns were addressed to his satisfaction. He voices understanding of the instructions provided during this encounter.   This note was created using a voice recognition software as a result there may be grammatical errors inadvertently enclosed that do not reflect the nature of this encounter. Every attempt is made to correct such errors.   [name], [place], [facility]  Pager: [phone] Office: [phone]

## 2021-11-20 ENCOUNTER — Ambulatory Visit: Payer: Medicare Other | Admitting: Cardiology

## 2021-11-21 ENCOUNTER — Ambulatory Visit (INDEPENDENT_AMBULATORY_CARE_PROVIDER_SITE_OTHER): Payer: Medicare Other | Admitting: Podiatry

## 2021-11-21 ENCOUNTER — Encounter: Payer: Self-pay | Admitting: Podiatry

## 2021-11-21 DIAGNOSIS — M79674 Pain in right toe(s): Secondary | ICD-10-CM | POA: Diagnosis not present

## 2021-11-21 DIAGNOSIS — B351 Tinea unguium: Secondary | ICD-10-CM

## 2021-11-21 DIAGNOSIS — L84 Corns and callosities: Secondary | ICD-10-CM

## 2021-11-21 DIAGNOSIS — E1142 Type 2 diabetes mellitus with diabetic polyneuropathy: Secondary | ICD-10-CM

## 2021-11-21 DIAGNOSIS — M79675 Pain in left toe(s): Secondary | ICD-10-CM | POA: Diagnosis not present

## 2021-11-21 DIAGNOSIS — B353 Tinea pedis: Secondary | ICD-10-CM

## 2021-11-21 DIAGNOSIS — E119 Type 2 diabetes mellitus without complications: Secondary | ICD-10-CM | POA: Diagnosis not present

## 2021-11-21 MED ORDER — KETOCONAZOLE 2 % EX CREA: TOPICAL_CREAM | CUTANEOUS | 1 refills | Status: DC

## 2021-11-28 NOTE — Progress Notes (Signed)
  Subjective:  Patient ID: David Burton, male    DOB: 05/12/1946,  MRN: [id number]  David Burton presents to clinic today for at risk foot care with history of diabetic neuropathy and callus(es) left lower extremity and painful thick toenails that are difficult to trim. Painful toenails interfere with ambulation. Aggravating factors include wearing enclosed shoe gear. Pain is relieved with periodic professional debridement. Painful calluses are aggravated when weightbearing with and without shoegear. Pain is relieved with periodic professional debridement.  Patient states blood glucose was 150 mg/dl today.  Last known HgA1c was unknown.   New problem(s): None.   PCP is [name], [name], NP , and last visit was last week.  No Known Allergies  Review of Systems: Negative except as noted in the HPI.  Objective:  General: Patient is a pleasant 76 y.o. African American male obese in NAD. AAO x 3.   Neurovascular Examination: CFT <3 seconds b/l LE. Faintly palpable pedal pulses b/l. Pedal hair absent. Lower extremity skin temperature gradient within normal limits. Trace edema noted BLE.  Pt has subjective symptoms of neuropathy. Protective sensation intact 5/5 intact bilaterally with 10g monofilament b/l.  Dermatological:  Pedal integument with normal turgor, texture and tone b/l LE. No open wounds b/l. No interdigital macerations b/l. Toenails 1-5 b/l elongated, thickened, discolored with subungual debris. +Tenderness with dorsal palpation of nailplates. Hyperkeratotic lesion(s) submet head 5 left foot.  No erythema, no edema, no drainage, no fluctuance. Diffuse scaling noted peripherally and plantarly b/l feet.  Mild foot odor. No interdigital macerations.  No blisters, no weeping. No signs of secondary bacterial infection noted.  Musculoskeletal:  Normal muscle strength 5/5 to all lower extremity muscle groups bilaterally. No pain, crepitus or joint limitation noted with ROM bilateral LE. HAV  with bunion deformity noted b/l LE. Pes planus deformity noted bilateral LE.     Latest Ref Rng & Units 12/13/2020    1:28 PM  Hemoglobin A1C  Hemoglobin-A1c <5.7 % of total Hgb 8.5    Assessment/Plan: 1. Pain due to onychomycosis of toenails of both feet   2. Tinea pedis of both feet   3. Callus   4. Type 2 diabetes, controlled, with peripheral neuropathy ([place])   5. Encounter for diabetic foot exam [facility])     -Patient was evaluated and treated. All patient's and/or POA's questions/concerns answered on today's visit. -Diabetic foot examination performed today. -Patient to continue soft, supportive shoe gear daily. -Mycotic toenails 1-5 bilaterally were debrided in length and girth with sterile nail nippers and dremel without incident. -Callus(es) submet head 5 left foot pared utilizing sterile scalpel blade without complication or incident. Total number debrided =1. -For tinea pedis, Rx sent to pharmacy for Ketoconazole Cream 2% to be applied once daily for six weeks. -Patient instructed to spray his shoes with Lysol every evening. He related understanding.. -Patient/POA to call should there be question/concern in the interim.   Return in about 3 months (around 02/21/2022).  Marzetta Board, DPM

## 2021-12-18 ENCOUNTER — Other Ambulatory Visit: Payer: Self-pay | Admitting: Cardiology

## 2021-12-18 DIAGNOSIS — I5032 Chronic diastolic (congestive) heart failure: Secondary | ICD-10-CM

## 2021-12-21 ENCOUNTER — Other Ambulatory Visit: Payer: Self-pay | Admitting: Family

## 2021-12-21 DIAGNOSIS — I129 Hypertensive chronic kidney disease with stage 1 through stage 4 chronic kidney disease, or unspecified chronic kidney disease: Secondary | ICD-10-CM

## 2021-12-26 ENCOUNTER — Ambulatory Visit: Payer: Self-pay | Admitting: *Deleted

## 2021-12-26 NOTE — Patient Outreach (Signed)
[facility] [facility]) Care Management  12/26/2021  David Burton [birth year] [phone]   RN Health Coach telephone call to patient.  [employer] compliance verified. [phone]  Patient fasting blood sugar was 166. Per patient he went to the PCP [phone] He does not know what his latest A1C is. He is following up with the Podiatrist on a regular basis. RN Health Coach case closure. Refer to Care Coordinator for continued follow up care.  Plan: RN Health Coach case closure Refer to Care Coordinator  [place] [facility] [phone]

## 2022-01-22 ENCOUNTER — Ambulatory Visit: Payer: Medicare Other | Admitting: *Deleted

## 2022-01-25 DIAGNOSIS — M5416 Radiculopathy, lumbar region: Secondary | ICD-10-CM | POA: Insufficient documentation

## 2022-02-16 ENCOUNTER — Other Ambulatory Visit: Payer: Self-pay | Admitting: Cardiology

## 2022-02-16 DIAGNOSIS — Z955 Presence of coronary angioplasty implant and graft: Secondary | ICD-10-CM

## 2022-02-16 DIAGNOSIS — I5033 Acute on chronic diastolic (congestive) heart failure: Secondary | ICD-10-CM

## 2022-02-16 DIAGNOSIS — Z8673 Personal history of transient ischemic attack (TIA), and cerebral infarction without residual deficits: Secondary | ICD-10-CM

## 2022-02-25 ENCOUNTER — Encounter: Payer: Self-pay | Admitting: Podiatry

## 2022-02-25 ENCOUNTER — Ambulatory Visit (INDEPENDENT_AMBULATORY_CARE_PROVIDER_SITE_OTHER): Payer: Medicare Other | Admitting: Podiatry

## 2022-02-25 DIAGNOSIS — M79674 Pain in right toe(s): Secondary | ICD-10-CM

## 2022-02-25 DIAGNOSIS — E1142 Type 2 diabetes mellitus with diabetic polyneuropathy: Secondary | ICD-10-CM

## 2022-02-25 DIAGNOSIS — B351 Tinea unguium: Secondary | ICD-10-CM | POA: Diagnosis not present

## 2022-02-25 DIAGNOSIS — L84 Corns and callosities: Secondary | ICD-10-CM | POA: Diagnosis not present

## 2022-02-25 DIAGNOSIS — M79675 Pain in left toe(s): Secondary | ICD-10-CM | POA: Diagnosis not present

## 2022-03-01 DIAGNOSIS — M7062 Trochanteric bursitis, left hip: Secondary | ICD-10-CM | POA: Insufficient documentation

## 2022-03-02 NOTE — Progress Notes (Signed)
  Subjective:  Patient ID: David Burton, male    DOB: 08-09-1945,  MRN: [id number]  Chief Complaint  Patient presents with   Nail Problem    Diabetic foot care BS-200 A1C-Do not Know PCP-[facility] ([place]) PCP VST-1 month ago   New problem(s): None.   PCP is [name], [name], NP , and last visit was  March 30, 2021.  Patient states he has a new great grandbaby.  No Known Allergies  Review of Systems: Negative except as noted in the HPI.  Objective: No changes noted in today's physical examination.  Bryce Cheever is a pleasant 76 y.o. male in NAD. AAO x 3.  Neurovascular Examination: CFT <3 seconds b/l LE. Faintly palpable pedal pulses b/l. Pedal hair absent. Lower extremity skin temperature gradient within normal limits. Trace edema noted BLE.  Pt has subjective symptoms of neuropathy. Protective sensation intact 5/5 intact bilaterally with 10g monofilament b/l.  Dermatological:  Pedal integument with normal turgor, texture and tone b/l LE. No open wounds b/l. No interdigital macerations b/l. Toenails 1-5 b/l elongated, thickened, discolored with subungual debris. +Tenderness with dorsal palpation of nailplates.   Hyperkeratotic lesion(s) submet head 5 left foot.  No erythema, no edema, no drainage, no fluctuance.   Musculoskeletal:  Normal muscle strength 5/5 to all lower extremity muscle groups bilaterally. No pain, crepitus or joint limitation noted with ROM bilateral LE. HAV with bunion deformity noted b/l LE. Pes planus deformity noted bilateral LE.  Assessment/Plan: 1. Pain due to onychomycosis of toenails of both feet   2. Callus   3. Type 2 diabetes, controlled, with peripheral neuropathy (HCC)     No orders of the defined types were placed in this encounter.  -Examined patient. -No new findings. No new orders. -Continue diabetic foot care principles: inspect feet daily, monitor glucose as recommended by PCP and/or Endocrinologist, and  follow prescribed diet per PCP, Endocrinologist and/or dietician. -Toenails 1-5 b/l were debrided in length and girth with sterile nail nippers and dremel without iatrogenic bleeding.  -Patient/POA to call should there be question/concern in the interim.   Return in about 3 months (around 05/27/2022).  Marzetta Board, DPM

## 2022-03-04 ENCOUNTER — Encounter: Payer: Medicare Other | Admitting: Family

## 2022-03-08 ENCOUNTER — Other Ambulatory Visit (HOSPITAL_COMMUNITY): Payer: Self-pay

## 2022-03-08 MED ORDER — HYDROCODONE-ACETAMINOPHEN 10-325 MG PO TABS
1.0000 | ORAL_TABLET | Freq: Three times a day (TID) | ORAL | 0 refills | Status: DC | PRN
  Filled 2022-03-08: qty 90, 30d supply, fill #0

## 2022-04-05 ENCOUNTER — Other Ambulatory Visit (HOSPITAL_COMMUNITY): Payer: Self-pay

## 2022-04-05 ENCOUNTER — Other Ambulatory Visit: Payer: Self-pay | Admitting: Family

## 2022-04-05 DIAGNOSIS — E1142 Type 2 diabetes mellitus with diabetic polyneuropathy: Secondary | ICD-10-CM

## 2022-04-05 DIAGNOSIS — F1120 Opioid dependence, uncomplicated: Secondary | ICD-10-CM | POA: Insufficient documentation

## 2022-04-05 MED ORDER — OXYCODONE-ACETAMINOPHEN 7.5-325 MG PO TABS
1.0000 | ORAL_TABLET | Freq: Three times a day (TID) | ORAL | 0 refills | Status: DC | PRN
  Filled 2022-04-05: qty 90, 30d supply, fill #0

## 2022-04-05 MED ORDER — NOVOLOG FLEXPEN 100 UNIT/ML ~~LOC~~ SOPN
PEN_INJECTOR | SUBCUTANEOUS | 3 refills | Status: AC
  Filled 2022-04-05: qty 27, 90d supply, fill #0

## 2022-04-08 ENCOUNTER — Other Ambulatory Visit (HOSPITAL_COMMUNITY): Payer: Self-pay

## 2022-04-08 MED ORDER — BD PEN NEEDLE NANO 2ND GEN 32G X 4 MM MISC
3 refills | Status: AC
  Filled 2022-04-08: qty 100, 33d supply, fill #0

## 2022-04-12 ENCOUNTER — Other Ambulatory Visit (HOSPITAL_COMMUNITY): Payer: Self-pay

## 2022-04-16 ENCOUNTER — Other Ambulatory Visit (HOSPITAL_COMMUNITY): Payer: Self-pay

## 2022-04-29 DIAGNOSIS — M47816 Spondylosis without myelopathy or radiculopathy, lumbar region: Secondary | ICD-10-CM | POA: Insufficient documentation

## 2022-05-12 ENCOUNTER — Other Ambulatory Visit: Payer: Self-pay | Admitting: Cardiology

## 2022-05-12 DIAGNOSIS — Z955 Presence of coronary angioplasty implant and graft: Secondary | ICD-10-CM

## 2022-05-12 DIAGNOSIS — Z8673 Personal history of transient ischemic attack (TIA), and cerebral infarction without residual deficits: Secondary | ICD-10-CM

## 2022-05-21 ENCOUNTER — Ambulatory Visit: Payer: Medicare Other | Admitting: Cardiology

## 2022-05-21 ENCOUNTER — Encounter: Payer: Self-pay | Admitting: Cardiology

## 2022-05-21 VITALS — BP 153/91 | HR 72 | Resp 19 | Ht 67.0 in | Wt 267.4 lb

## 2022-05-21 DIAGNOSIS — Z955 Presence of coronary angioplasty implant and graft: Secondary | ICD-10-CM

## 2022-05-21 DIAGNOSIS — Z794 Long term (current) use of insulin: Secondary | ICD-10-CM

## 2022-05-21 DIAGNOSIS — Z87891 Personal history of nicotine dependence: Secondary | ICD-10-CM

## 2022-05-21 DIAGNOSIS — I2510 Atherosclerotic heart disease of native coronary artery without angina pectoris: Secondary | ICD-10-CM

## 2022-05-21 DIAGNOSIS — E7800 Pure hypercholesterolemia, unspecified: Secondary | ICD-10-CM

## 2022-05-21 DIAGNOSIS — Z951 Presence of aortocoronary bypass graft: Secondary | ICD-10-CM

## 2022-05-21 DIAGNOSIS — I5032 Chronic diastolic (congestive) heart failure: Secondary | ICD-10-CM

## 2022-05-21 NOTE — Progress Notes (Signed)
David Burton Date of Birth: Mar 02, 1946 MRN: [id number] Primary Care Provider:[name], [name], NP Former Cardiology Providers: Dr. Adrian Prows, Jeri Lager, APRN, FNP-C Primary Cardiologist: [name], DO, [facility] (established care 01/11/2020)  Date: 05/21/22 Last Office Visit: 06/19/20023  Chief Complaint  Patient presents with   Coronary Artery Disease   Congestive Heart Failure   Follow-up    6 month    HPI  David Burton is a 76 y.o.  male whose  past medical history and cardiovascular risk factors include: Chronic HFpEF, NSTEMI in Dec 2018, s/p CABG x 4 on 05/08/2017 (LIMA to LAD, SVG to PDA, SVG to Diagonal, SVG to Ramus. Surgery done at [place]), diabetes mellitus type 2 with peripheral neuropathy, hypertension, hypercholesterolemia, CVA (09/2019), advanced age.  Patient is being followed by the practice for management of his CAD and HFpEF.  He presents today for 75-monthfollow-up visit.  He denies anginal discomfort or heart failure symptoms.  No hospitalizations or urgent care visits since last office encounter.  Patient states that he is compliant with all his medications but does not recall them.  He states that his daughter takes care of them and will call the office to further reconcile them.  He is tolerating the PCSK9 inhibitors well without any side effects or intolerance.  FUNCTIONAL STATUS: He walks atleast 0.5 miles per day.    ALLERGIES: No Known Allergies  MEDICATION LIST PRIOR TO VISIT: Current Outpatient Medications on File Prior to Visit  Medication Sig Dispense Refill   amLODipine (NORVASC) 10 MG tablet TAKE 1 TABLET(10 MG) BY MOUTH DAILY 90 tablet 0   aspirin 81 MG chewable tablet Chew 1 tablet by mouth daily.     atorvastatin (LIPITOR) 20 MG tablet TAKE 1 TABLET(20 MG) BY MOUTH DAILY 90 tablet 1   clopidogrel (PLAVIX) 75 MG tablet TAKE 1 TABLET(75 MG) BY MOUTH DAILY 90 tablet 0   Continuous Blood Gluc Sensor (DEXCOM G6 SENSOR) MISC Use  to test blood sugar once daily. Dx: E11.42 9 each 3   Continuous Blood Gluc Transmit (DEXCOM G6 TRANSMITTER) MISC Use to test blood sugar once daily. Dx: E11.42 1 each 0   ENTRESTO 24-26 MG TAKE 1 TABLET BY MOUTH TWICE DAILY 60 tablet 0   ezetimibe (ZETIA) 10 MG tablet TAKE 1 TABLET(10 MG) BY MOUTH DAILY 90 tablet 3   FARXIGA 10 MG TABS tablet TAKE 1 TABLET BY MOUTH DAILY BEFORE BREAKFAST 90 tablet 0   furosemide (LASIX) 40 MG tablet Take 40 mg by mouth every morning.     gabapentin (NEURONTIN) 100 MG capsule Take 100 mg by mouth 3 (three) times daily.     glucose blood (ACCU-CHEK AVIVA PLUS) test strip Use to test blood sugar 3 times daily. Dx: E11.42 300 each 1   insulin aspart (NOVOLOG FLEXPEN) 100 UNIT/ML FlexPen Use depending on blood sugar: <150 take 0 units, 150-200 take 2 units, 200-250 take 4 units, 250-300 take 6 units, 300-350 take 8 units, >350 take 10 units 30 mL 3   insulin detemir (LEVEMIR) 100 UNIT/ML FlexPen Inject 22 Units into the skin at bedtime. 30 mL 1   insulin lispro (HUMALOG KWIKPEN) 100 UNIT/ML KwikPen Inject 8-10 units into the skin per sliding scale three times daily with meals. 45 mL 3   Insulin Pen Needle (BD PEN NEEDLE NANO 2ND GEN) 32G X 4 MM MISC USE 1 PEN THREE TIMES DAILY AS DIRECTED 100 each 3   isosorbide mononitrate (IMDUR) 60 MG 24 hr  tablet TAKE 1 TABLET(60 MG) BY MOUTH DAILY 90 tablet 1   ketoconazole (NIZORAL) 2 % cream Apply to both feet and between toes once daily for 6 weeks. 60 g 1   metoprolol tartrate (LOPRESSOR) 25 MG tablet TAKE 1 TABLET(25 MG) BY MOUTH TWICE DAILY 30 tablet 0   nitroGLYCERIN (NITROSTAT) 0.4 MG SL tablet Dissolve one tablet under tongue as needed for chest pain. 30 tablet 3   NOVOLOG FLEXPEN 100 UNIT/ML FlexPen      oxyCODONE-acetaminophen (PERCOCET) 7.5-325 MG tablet Take 1 tablet by mouth every 8 (eight) hours as needed for pain 90 tablet 0   pregabalin (LYRICA) 100 MG capsule Take 100 mg by mouth daily.     REPATHA 140 MG/ML  SOSY Inject 140 mg into the skin every 14 (fourteen) days. 6 mL 3   tamsulosin (FLOMAX) 0.4 MG CAPS capsule Take 1 capsule by mouth daily.     TRULICITY 7.26 OM/3.5DH SOPN Inject 0.75 mg into the skin once a week.     No current facility-administered medications on file prior to visit.    PAST MEDICAL HISTORY: Past Medical History:  Diagnosis Date   Bell palsy    CKD (chronic kidney disease), stage III ([place]) 12/16/2018   Coronary artery disease involving native coronary artery of native heart with unstable angina pectoris ([place]) 12/16/2018   GERD (gastroesophageal reflux disease)    Hypertension    Hypertension with heart disease 12/16/2018   Myocardial infarction ([place])    Pure hypercholesterolemia 12/16/2018   Stroke ([place])    Type 2 diabetes, controlled, with peripheral neuropathy ([place]) 12/16/2018    PAST SURGICAL HISTORY: Past Surgical History:  Procedure Laterality Date   CARDIAC SURGERY     COLONOSCOPY WITH PROPOFOL N/A 01/02/2018   Procedure: COLONOSCOPY WITH PROPOFOL;  Surgeon: Carol Ada, MD;  Location: [facility];  Service: Endoscopy;  Laterality: N/A;   CORONARY STENT INTERVENTION N/A 01/26/2019   Procedure: CORONARY STENT INTERVENTION;  Surgeon: Adrian Prows, MD;  Location: [facility];  Service: Cardiovascular;  Laterality: N/A;   LEFT HEART CATH AND CORS/GRAFTS ANGIOGRAPHY N/A 01/05/2019   Procedure: LEFT HEART CATH AND CORS/GRAFTS ANGIOGRAPHY;  Surgeon: Adrian Prows, MD;  Location: [facility];  Service: Cardiovascular;  Laterality: N/A;   POLYPECTOMY  01/02/2018   Procedure: POLYPECTOMY;  Surgeon: Carol Ada, MD;  Location: [facility];  Service: Endoscopy;;   stents      FAMILY HISTORY: The patient's family history includes Cancer in his mother; Heart attack in his father.   SOCIAL HISTORY:  The patient  reports that he quit smoking about 18 years ago. His smoking use included cigarettes. He has a 5.00 pack-year smoking history. He has never used  smokeless tobacco. He reports current alcohol use. He reports that he does not use drugs.  Review of Systems  Constitutional: Positive for weight loss.  Cardiovascular:  Negative for chest pain, claudication, dyspnea on exertion, leg swelling, near-syncope, orthopnea, palpitations, paroxysmal nocturnal dyspnea and syncope.  Respiratory:  Negative for shortness of breath.    PHYSICAL EXAM:    05/21/2022    1:44 PM 11/19/2021    1:11 PM 08/13/2021    2:21 PM  Vitals with BMI  Height '5\' 7"'$  '5\' 7"'$  '5\' 7"'$   Weight 267 lbs 6 oz 270 lbs 6 oz 270 lbs 13 oz  BMI 41.87 74.16 38.4  Systolic 536 468 032  Diastolic 91 76 62  Pulse 72 65 62   CONSTITUTIONAL: Appears older than stated  age, hemodynamically stable, no acute distress  SKIN: Skin is warm and dry. No rash noted. No cyanosis. No pallor. No jaundice HEAD: Normocephalic and atraumatic.  EYES: No scleral icterus MOUTH/THROAT: Moist oral membranes.  NECK: No JVD present. No thyromegaly noted. No carotid bruits  CHEST Normal respiratory effort. No intercostal retractions  LUNGS: Clear to auscultation bilaterally.  No stridor. No wheezes. No rales.  CARDIOVASCULAR: Regular rate and rhythm, positive S1-S2, no murmurs rubs or gallops appreciated. ABDOMINAL: Obese, soft, nontender, nondistended, positive bowel sounds in all 4 quadrants no apparent ascites.  EXTREMITIES: trace bilateral edema, warm to touch, faint DP and PT pulses. HEMATOLOGIC: No significant bruising NEUROLOGIC: Oriented to person, place, and time. Nonfocal. Normal muscle tone.  PSYCHIATRIC: Normal mood and affect. Normal behavior. Cooperative  RADIOLOGY: MR Brain w/o contrast:  IMPRESSION: 1. Mildly motion degraded exam. 2. Confirmed 17 mm acute/early subacute infarct within the right pons. 3. Background mild chronic small vessel ischemic disease within the cerebral white matter and pons. Tiny chronic lacunar infarct within the left cerebellum. 4. Mild generalized  parenchymal atrophy. 5. Mild paranasal sinus mucosal thickening. Trace left mastoid effusion.  CARDIAC DATABASE: Coronary artery bypass grafting: s/p CABG x 4 on 05/08/2017 (LIMA to LAD, SVG to PDA, SVG to Diagonal, SVG to Ramus.  EKG: 11/19/2021: NSR, 65 bpm, consider old inferior infarct, without underlying injury pattern. 05/21/2022: Sinus rhythm, 65 bpm, incomplete right bundle branch block, left axis, old inferior infarct.  Echocardiogram: 08/21/2021:  Normal LV systolic function with visual EF 55-60%. Left ventricle cavity is normal in size. Normal left ventricular wall thickness. Normal global wall motion. Doppler evidence of grade II (pseudonormal) diastolic dysfunction, elevated LAP.  Mild (Grade I) mitral regurgitation.  Mild tricuspid regurgitation. No evidence of pulmonary hypertension. RVSP measures 37 mmHg.  Compared to study 09/17/2019 mild MR and TR are new otherwise no significant change.   Stress Testing:  Lexiscan myoview stress test 10/10/2017: 1. Lexiscan stress test was performed. Exercise capacity was not assessed. Stress symptoms included dizziness. Normal blood pressure. The resting electrocardiogram demonstrated normal sinus rhythm, normal resting conduction, no resting arrhythmias and normal rest repolarization. Stress EKG is non diagnostic for ischemia as it is a pharmacologic stress. 2. The overall quality of the study is good. Left ventricular cavity is noted to be normal on the rest and stress studies. Gated SPECT images reveal normal myocardial thickening and wall motion, except mild inferior hypokinesis. The left ventricular ejection fraction was calculated or visually estimated to be 53%. SPECT stress images demonstrate medium size, moderate intensity perfusion defect in basal to apical inferior, inferolateral myocardium. SPECT rest images demonstrate medium size, mild intensity perfusion defect in basal to apical inferior myocardium. This suggests inferior  infarct with periinfarct mild ischemia, as well as inferolateral ischemia. (LCx/PDA territory). 3. Intermediate risk study.  Heart Catheterization: Coronary angiogram 01/05/2019: Normal LVEDP, severe native vessel disease.  High-grade proximal RCA 90 to 95% stenosis followed by 70 to 80% stenosis in the in-stent restenotic proximal segment.  Large RCA.  SVG to RCA could not be visualized in spite of multiple catheter attempts, and presumed occluded. Left main is large and mildly diseased but patent.  LAD is occluded in the midsegment after the origin of D1, which has ostial 90% stenosis.  SVG to D1 is patent.  LIMA to LAD is patent. Ramus intermediate: Large vessel, mild to moderate calcific 80 to 85% stenosis in the proximal segment.  SVG to ramus intermediate is occluded. Circumflex: High-grade 99%/subtotal occlusion  in the ostium.  Moderate to large sized vessel.  Small marginals.  Coronary intervention 01/26/2019: Successful PTCA and stenting of the proximal mid and mid to distal RCA with implantation of 2 overlapping 3.0 x 38 mm Promus Synergy DES, stenosis reduced from 90% to 0% with TIMI-3 to TIMI-3 flow.     Recommendation: With medical high-grade ramus intermediate and circumflex stenosis, he has excellent brisk flow there and ischemia was in the inferior wall, however if he still persist to have recurrence of angina pectoris we will schedule him for complex intervention to the left coronary system.  In view of his underlying comorbidity, would recommend medical therapy  Carotid duplex: 09/17/2019: Right Carotid: Velocities in the right ICA are consistent with a 1-39% stenosis.  Left Carotid: Velocities in the left ICA are consistent with a 1-39% stenosis.  Vertebrals: Left vertebral artery demonstrates antegrade flow. Right vertebral artery was not visualized  14 - day [facility]ulatory Telemetry.  Dominant rhythm normal sinus. Heart rate 59-156 bpm.  Avg HR 85 bpm. No atrial  fibrillation, ventricular tachycardia, high grade AV block, pauses (3 seconds or longer). Total ventricular and supraventricular ectopic burden <1%. Two episode of supraventricular tachycardia. Longest and fastest episode was 17 beats at maximum HR of 156 bpm. Patient triggered events: 0.  LABORATORY DATA:    Latest Ref Rng & Units 12/13/2020    1:28 PM 09/15/2020    1:47 AM 09/14/2020    4:14 AM  CBC  WBC 3.8 - 10.8 Thousand/uL 7.8  10.5  8.9   Hemoglobin 13.2 - 17.1 g/dL 13.6  12.4  12.0   Hematocrit 38.5 - 50.0 % 41.3  35.0  33.5   Platelets 140 - 400 Thousand/uL 141  178  156        Latest Ref Rng & Units 10/19/2021   10:05 AM 10/10/2021   10:25 AM 09/25/2021   10:39 AM  CMP  Glucose 70 - 99 mg/dL 193  216  166   BUN 8 - 27 mg/dL 29  34  33   Creatinine 0.76 - 1.27 mg/dL 1.64  2.20  1.86   Sodium 134 - 144 mmol/L 139  138  141   Potassium 3.5 - 5.2 mmol/L 4.5  4.2  4.9   Chloride 96 - 106 mmol/L 101  98  110   CO2 20 - 29 mmol/L '20  20  17   '$ Calcium 8.6 - 10.2 mg/dL 9.6  9.9  9.0    Last lipids Lab Results  Component Value Date   CHOL 80 (L) 07/10/2021   HDL 40 07/10/2021   LDLCALC 18 07/10/2021   LDLDIRECT 22 07/10/2021   TRIG 122 07/10/2021   CHOLHDL 2.1 12/13/2020    Lab Results  Component Value Date   HGBA1C 8.5 (H) 12/13/2020   HGBA1C 8.1 (H) 09/06/2020   HGBA1C 6.6 (H) 05/08/2020   No components found for: "NTPROBNP" Lab Results  Component Value Date   TSH 4.89 (H) 12/13/2020   TSH 1.63 09/06/2020   TSH 3.02 05/08/2020    Cardiac Panel (last 3 results) No results for input(s): "CKTOTAL", "CKMB", "TROPONINIHS", "RELINDX" in the last 72 hours.  IMPRESSION:    ICD-10-CM   1. Chronic heart failure with preserved ejection fraction (HFpEF) (HCC)  I50.32 EKG 12-Lead    2. Coronary artery disease involving native coronary artery of native heart without angina pectoris  I25.10     3. S/P primary angioplasty with coronary stent  Z95.5  4. S/P CABG x  4  Z95.1     5. Type 2 diabetes mellitus with other circulatory complication, with long-term current use of insulin (HCC)  E11.59    Z79.4     6. Pure hypercholesterolemia  E78.00     7. Former smoker  Z87.891         RECOMMENDATIONS: David Burton is a 76 y.o. male whose past medical history and cardiovascular risk factors include: NSTEMI in Dec 2018, s/p CABG x 4 on 05/08/2017 (LIMA to LAD, SVG to PDA, SVG to Diagonal, SVG to Ramus. Surgery done at [place]), diabetes mellitus type 2 with peripheral neuropathy, hypertension, hypercholesterolemia, CVA (09/2019), advanced age.  Chronic heart failure with preserved ejection fraction (HFpEF) (HCC) Stage C, NYHA class II No recent hospitalizations for CHF since last office encounter. Weight continues to trend down. Unable to perform accurate medication reconciliation as it currently being handled by her daughter.  Patient will have to call the office for updating his MAR. Most recent echocardiogram from March 2023 reviewed at today's office visit. No additional testing warranted at this time   Coronary artery disease involving native coronary artery of native heart without angina pectoris / s/p primary angioplasty with coronary stent / S/P CABG x 4 Denies angina pectoris.   No sublingual nitroglycerin tablets since last office visit. LDL currently at goal. Continue PCSK9 inhibitors including Zetia and statin therapy. He will have lipids checked with his PCP and forward [place] a copy.  Pure hypercholesterolemia: Most recent LDL 22 mg/dL as of February 2023. Continue current medical therapy. Does not endorse myalgias.  Continue management of other chronic comorbid conditions as per his other providers and care team.  No additional testing warranted at this time.  Will continue to follow-up every 6 months or sooner if needed.  FINAL MEDICATION LIST END OF ENCOUNTER:   Current Outpatient Medications:    amLODipine  (NORVASC) 10 MG tablet, TAKE 1 TABLET(10 MG) BY MOUTH DAILY, Disp: 90 tablet, Rfl: 0   aspirin 81 MG chewable tablet, Chew 1 tablet by mouth daily., Disp: , Rfl:    atorvastatin (LIPITOR) 20 MG tablet, TAKE 1 TABLET(20 MG) BY MOUTH DAILY, Disp: 90 tablet, Rfl: 1   clopidogrel (PLAVIX) 75 MG tablet, TAKE 1 TABLET(75 MG) BY MOUTH DAILY, Disp: 90 tablet, Rfl: 0   Continuous Blood Gluc Sensor (DEXCOM G6 SENSOR) MISC, Use to test blood sugar once daily. Dx: E11.42, Disp: 9 each, Rfl: 3   Continuous Blood Gluc Transmit (DEXCOM G6 TRANSMITTER) MISC, Use to test blood sugar once daily. Dx: E11.42, Disp: 1 each, Rfl: 0   ENTRESTO 24-26 MG, TAKE 1 TABLET BY MOUTH TWICE DAILY, Disp: 60 tablet, Rfl: 0   ezetimibe (ZETIA) 10 MG tablet, TAKE 1 TABLET(10 MG) BY MOUTH DAILY, Disp: 90 tablet, Rfl: 3   FARXIGA 10 MG TABS tablet, TAKE 1 TABLET BY MOUTH DAILY BEFORE BREAKFAST, Disp: 90 tablet, Rfl: 0   furosemide (LASIX) 40 MG tablet, Take 40 mg by mouth every morning., Disp: , Rfl:    gabapentin (NEURONTIN) 100 MG capsule, Take 100 mg by mouth 3 (three) times daily., Disp: , Rfl:    glucose blood (ACCU-CHEK AVIVA PLUS) test strip, Use to test blood sugar 3 times daily. Dx: E11.42, Disp: 300 each, Rfl: 1   insulin aspart (NOVOLOG FLEXPEN) 100 UNIT/ML FlexPen, Use depending on blood sugar: <150 take 0 units, 150-200 take 2 units, 200-250 take 4 units, 250-300 take 6 units, 300-350 take 8  units, >350 take 10 units, Disp: 30 mL, Rfl: 3   insulin detemir (LEVEMIR) 100 UNIT/ML FlexPen, Inject 22 Units into the skin at bedtime., Disp: 30 mL, Rfl: 1   insulin lispro (HUMALOG KWIKPEN) 100 UNIT/ML KwikPen, Inject 8-10 units into the skin per sliding scale three times daily with meals., Disp: 45 mL, Rfl: 3   Insulin Pen Needle (BD PEN NEEDLE NANO 2ND GEN) 32G X 4 MM MISC, USE 1 PEN THREE TIMES DAILY AS DIRECTED, Disp: 100 each, Rfl: 3   isosorbide mononitrate (IMDUR) 60 MG 24 hr tablet, TAKE 1 TABLET(60 MG) BY MOUTH DAILY, Disp:  90 tablet, Rfl: 1   ketoconazole (NIZORAL) 2 % cream, Apply to both feet and between toes once daily for 6 weeks., Disp: 60 g, Rfl: 1   metoprolol tartrate (LOPRESSOR) 25 MG tablet, TAKE 1 TABLET(25 MG) BY MOUTH TWICE DAILY, Disp: 30 tablet, Rfl: 0   nitroGLYCERIN (NITROSTAT) 0.4 MG SL tablet, Dissolve one tablet under tongue as needed for chest pain., Disp: 30 tablet, Rfl: 3   NOVOLOG FLEXPEN 100 UNIT/ML FlexPen, , Disp: , Rfl:    oxyCODONE-acetaminophen (PERCOCET) 7.5-325 MG tablet, Take 1 tablet by mouth every 8 (eight) hours as needed for pain, Disp: 90 tablet, Rfl: 0   pregabalin (LYRICA) 100 MG capsule, Take 100 mg by mouth daily., Disp: , Rfl:    REPATHA 140 MG/ML SOSY, Inject 140 mg into the skin every 14 (fourteen) days., Disp: 6 mL, Rfl: 3   tamsulosin (FLOMAX) 0.4 MG CAPS capsule, Take 1 capsule by mouth daily., Disp: , Rfl:    TRULICITY 5.02 DX/4.1OI SOPN, Inject 0.75 mg into the skin once a week., Disp: , Rfl:   Orders Placed This Encounter  Procedures   EKG 12-Lead   --Continue cardiac medications as reconciled in final medication list. --Return in about 6 months (around 11/20/2022) for Follow up HFpEF. Or sooner if needed. --Continue follow-up with your primary care physician regarding the management of your other chronic comorbid conditions.  Patient's questions and concerns were addressed to his satisfaction. He voices understanding of the instructions provided during this encounter.   This note was created using a voice recognition software as a result there may be grammatical errors inadvertently enclosed that do not reflect the nature of this encounter. Every attempt is made to correct such errors.   [name], [place], [facility]  Pager: [phone] Office: [phone]

## 2022-06-02 ENCOUNTER — Encounter: Payer: Self-pay | Admitting: Cardiology

## 2022-06-12 ENCOUNTER — Ambulatory Visit (INDEPENDENT_AMBULATORY_CARE_PROVIDER_SITE_OTHER): Payer: Medicare Other | Admitting: Podiatry

## 2022-06-12 VITALS — BP 153/81

## 2022-06-12 DIAGNOSIS — M79675 Pain in left toe(s): Secondary | ICD-10-CM | POA: Diagnosis not present

## 2022-06-12 DIAGNOSIS — M79674 Pain in right toe(s): Secondary | ICD-10-CM

## 2022-06-12 DIAGNOSIS — E1142 Type 2 diabetes mellitus with diabetic polyneuropathy: Secondary | ICD-10-CM | POA: Diagnosis not present

## 2022-06-12 DIAGNOSIS — L84 Corns and callosities: Secondary | ICD-10-CM

## 2022-06-12 DIAGNOSIS — B351 Tinea unguium: Secondary | ICD-10-CM

## 2022-06-12 NOTE — Progress Notes (Signed)
  Subjective:  Patient ID: David Burton, male    DOB: May 14, 1946,  MRN: [id number]  Adyn Serna presents to clinic today for at risk foot care with history of diabetic neuropathy and painful thick toenails that are difficult to trim. Pain interferes with ambulation. Aggravating factors include wearing enclosed shoe gear. Pain is relieved with periodic professional debridement.  Chief Complaint  Patient presents with   Nail Problem    DFC BS-120 A1C-7.0 PCP-[facility] ([place]) PCP VST-last week   New problem(s): None.   PCP is [name], [name], NP.  No Known Allergies  Review of Systems: Negative except as noted in the HPI.  Objective: No changes noted in today's physical examination. Vitals:   06/12/22 1613  BP: (!) 153/81   Lewayne Pauley is a pleasant 77 y.o. male morbidly obese in NAD. AAO x 3. Neurovascular Examination: CFT <3 seconds b/l LE. Faintly palpable pedal pulses b/l. Pedal hair absent. Lower extremity skin temperature gradient within normal limits. Trace edema noted BLE.  Pt has subjective symptoms of neuropathy. Protective sensation intact 5/5 intact bilaterally with 10g monofilament b/l.  Dermatological:  Pedal integument with normal turgor, texture and tone b/l LE. No open wounds b/l. No interdigital macerations b/l. Toenails 1-5 b/l elongated, thickened, discolored with subungual debris. +Tenderness with dorsal palpation of nailplates.   Hyperkeratotic lesion(s) submet head 5 left foot resolved.  Musculoskeletal:  Normal muscle strength 5/5 to all lower extremity muscle groups bilaterally. No pain, crepitus or joint limitation noted with ROM bilateral LE. HAV with bunion deformity noted b/l LE. Pes planus deformity noted bilateral LE.  Assessment/Plan: 1. Pain due to onychomycosis of toenails of both feet   2. Type 2 diabetes, controlled, with peripheral neuropathy (HCC)     No orders of the defined types were placed in this encounter.    -Patient was evaluated and treated. All patient's and/or POA's questions/concerns answered on today's visit. -Continue foot and shoe inspections daily. Monitor blood glucose per PCP/Endocrinologist's recommendations. -Patient to continue soft, supportive shoe gear daily. -Mycotic toenails 1-5 bilaterally were debrided in length and girth with sterile nail nippers and dremel without incident. -Patient/POA to call should there be question/concern in the interim.   Return in about 3 months (around 09/11/2022).  Marzetta Board, DPM

## 2022-06-17 ENCOUNTER — Encounter: Payer: Self-pay | Admitting: Podiatry

## 2022-07-30 DIAGNOSIS — Z79891 Long term (current) use of opiate analgesic: Secondary | ICD-10-CM | POA: Insufficient documentation

## 2022-08-07 ENCOUNTER — Other Ambulatory Visit: Payer: Self-pay | Admitting: Cardiology

## 2022-08-07 DIAGNOSIS — Z8673 Personal history of transient ischemic attack (TIA), and cerebral infarction without residual deficits: Secondary | ICD-10-CM

## 2022-08-07 DIAGNOSIS — Z955 Presence of coronary angioplasty implant and graft: Secondary | ICD-10-CM

## 2022-08-30 ENCOUNTER — Other Ambulatory Visit: Payer: Self-pay | Admitting: Pharmacist

## 2022-08-30 NOTE — Progress Notes (Signed)
Patient appearing on report for levemir usage in setting of upcoming levemir manufacturer discontinuation.   Outreached patient to discuss  glucose control and medication management.   Patient informed me he is no longer taking levemir, and is also no longer under the primary care of Blakeslee [name].   Larinda Buttery, PharmD, BCPS Clinical Pharmacist [facility]

## 2022-09-18 ENCOUNTER — Ambulatory Visit (INDEPENDENT_AMBULATORY_CARE_PROVIDER_SITE_OTHER): Payer: 59 | Admitting: Podiatry

## 2022-09-18 VITALS — BP 125/69

## 2022-09-18 DIAGNOSIS — E1142 Type 2 diabetes mellitus with diabetic polyneuropathy: Secondary | ICD-10-CM

## 2022-09-18 DIAGNOSIS — M79675 Pain in left toe(s): Secondary | ICD-10-CM

## 2022-09-18 DIAGNOSIS — B351 Tinea unguium: Secondary | ICD-10-CM | POA: Diagnosis not present

## 2022-09-18 DIAGNOSIS — M79674 Pain in right toe(s): Secondary | ICD-10-CM | POA: Diagnosis not present

## 2022-09-18 NOTE — Progress Notes (Signed)
  Subjective:  Patient ID: David Burton, male    DOB: [birth year],  MRN: [id number]  David Burton presents to clinic today for at risk foot care with history of diabetic neuropathy  Chief Complaint  Patient presents with   Nail Problem    DFC BS-120 A1C-8.0 PCP-P[facility]li ([facility]) PCP VST-2 weeks ago   New problem(s): None.   PCP is [name], Donalee Citrin, NP.  No Known Allergies  Review of Systems: Negative except as noted in the HPI.  Objective: No changes noted in today's physical examination. Vitals:   09/18/22 1503  BP: 125/69   David Burton is a pleasant 77 y.o. male morbidly obese in NAD. AAO x 3.  Neurovascular Examination: CFT <3 seconds b/l LE. Faintly palpable pedal pulses b/l. Pedal hair absent. Lower extremity skin temperature gradient within normal limits. Trace edema noted BLE.  Pt has subjective symptoms of neuropathy. Protective sensation intact 5/5 intact bilaterally with 10g monofilament b/l.  Dermatological:  Pedal integument with normal turgor, texture and tone b/l LE. No open wounds b/l. No interdigital macerations b/l. Toenails 1-5 b/l elongated, thickened, discolored with subungual debris. +Tenderness with dorsal palpation of nailplates.   Hyperkeratotic lesion(s) submet head 5 left foot resolved.  Musculoskeletal:  Normal muscle strength 5/5 to all lower extremity muscle groups bilaterally. No pain, crepitus or joint limitation noted with ROM bilateral LE. HAV with bunion deformity noted b/l LE. Pes planus deformity noted bilateral LE.  Assessment/Plan: 1. Pain due to onychomycosis of toenails of both feet   2. Type 2 diabetes, controlled, with peripheral neuropathy   -Consent given for treatment as described below: -Examined patient. -Continue foot and shoe inspections daily. Monitor blood glucose per PCP/Endocrinologist's recommendations. -Toenails 1-5 b/l were debrided in length and girth with sterile nail nippers and dremel without iatrogenic  bleeding.  -Patient/POA to call should there be question/concern in the interim.   Return in about 3 months (around 12/18/2022).  Freddie Breech, DPM

## 2022-09-22 ENCOUNTER — Encounter: Payer: Self-pay | Admitting: Podiatry

## 2022-11-02 ENCOUNTER — Other Ambulatory Visit: Payer: Self-pay | Admitting: Cardiology

## 2022-11-02 DIAGNOSIS — I2510 Atherosclerotic heart disease of native coronary artery without angina pectoris: Secondary | ICD-10-CM

## 2022-11-02 DIAGNOSIS — Z8673 Personal history of transient ischemic attack (TIA), and cerebral infarction without residual deficits: Secondary | ICD-10-CM

## 2022-11-02 DIAGNOSIS — E7800 Pure hypercholesterolemia, unspecified: Secondary | ICD-10-CM

## 2022-11-02 DIAGNOSIS — Z951 Presence of aortocoronary bypass graft: Secondary | ICD-10-CM

## 2022-11-07 ENCOUNTER — Encounter (HOSPITAL_COMMUNITY): Payer: Self-pay | Admitting: Emergency Medicine

## 2022-11-07 ENCOUNTER — Telehealth: Payer: Self-pay

## 2022-11-07 ENCOUNTER — Other Ambulatory Visit: Payer: Self-pay

## 2022-11-07 ENCOUNTER — Emergency Department (HOSPITAL_COMMUNITY)
Admission: EM | Admit: 2022-11-07 | Discharge: 2022-11-07 | Disposition: A | Discharge status: Home / Self Care | Payer: 59 | Attending: Emergency Medicine | Admitting: Emergency Medicine

## 2022-11-07 DIAGNOSIS — Z7982 Long term (current) use of aspirin: Secondary | ICD-10-CM | POA: Diagnosis not present

## 2022-11-07 DIAGNOSIS — I10 Essential (primary) hypertension: Secondary | ICD-10-CM | POA: Diagnosis not present

## 2022-11-07 DIAGNOSIS — L24A2 Irritant contact dermatitis due to fecal, urinary or dual incontinence: Secondary | ICD-10-CM | POA: Insufficient documentation

## 2022-11-07 DIAGNOSIS — R197 Diarrhea, unspecified: Secondary | ICD-10-CM | POA: Diagnosis present

## 2022-11-07 DIAGNOSIS — Z7902 Long term (current) use of antithrombotics/antiplatelets: Secondary | ICD-10-CM | POA: Insufficient documentation

## 2022-11-07 DIAGNOSIS — Z794 Long term (current) use of insulin: Secondary | ICD-10-CM | POA: Insufficient documentation

## 2022-11-07 DIAGNOSIS — L308 Other specified dermatitis: Secondary | ICD-10-CM

## 2022-11-07 MED ORDER — HYDROCORTISONE (PERIANAL) 2.5 % EX CREA: 1.0000 | TOPICAL_CREAM | Freq: Two times a day (BID) | CUTANEOUS | 0 refills | Status: DC

## 2022-11-07 MED ORDER — ZINC OXIDE 40 % EX OINT
TOPICAL_OINTMENT | Freq: Once | CUTANEOUS | Status: AC
Start: 2022-11-07 — End: 2022-11-07
  Filled 2022-11-07: qty 57

## 2022-11-07 NOTE — ED Triage Notes (Signed)
Pt in with hemorrhoid pain - states he has been taking Trulicity for a month, and has gotten hemorrhoids from frequent diarrhea. Pt just endorses light amount of blood on tissue. Denies any abdominal pain or n/v

## 2022-11-07 NOTE — ED Notes (Signed)
Pt is a&ox4, warm and dry to touch. Pt currently complains of some rectal pain and slight bleeding, as well as diarrhea. Pt states he was given a shot to help with weight loss (thinks it might be Ozempic), then had diarrhea which made his "raw back there". Pt denies any cp/shob. Pt has been changed into a gown, attached to monitor/vitals. Side rails up x 2, call light with patient. Covered with warm blanket.

## 2022-11-07 NOTE — Transitions of Care (Post Inpatient/ED Visit) (Signed)
   11/07/2022  Name: David Burton MRN: [id number] DOB: Apr 15, 1946  Today's TOC FU Call Status:    Attempted to reach the patient regarding the most recent Inpatient/ED visit.  Follow Up Plan: Additional outreach attempts will be made to reach the patient to complete the Transitions of Care (Post Inpatient/ED visit) call.   [facility]

## 2022-11-07 NOTE — Discharge Instructions (Addendum)
It was a pleasure take care of you today!  You have been given Desitin cream today, you may apply it to your bottom after each bowel movement.  You will be sent a prescription for Anusol cream.  Also increase your fiber intake of fruits and veggies.  You may also utilize sitz bath's.  Continue to take your medications as prescribed.  Follow-up with your primary care provider on today's ED visit.  Ensure to maintain fluid intake with water, tea, broth, soup, Pedialyte, Gatorade.  Return to the emergency department if you are experiencing increasing/worsening symptoms.

## 2022-11-07 NOTE — ED Provider Notes (Signed)
[place] EMERGENCY DEPARTMENT AT [facility] Provider Note   CSN: [id number] Arrival date & time: 11/07/22  3086     History  Chief Complaint  Patient presents with   Hemorrhoids    David Burton is a 77 y.o. male with a PMHx of MI, hypertension, who presents to the ED with concerns for possible hemorrhoids.  Notes that he had some blood mixed in with his stool.  Has a past medical history of hemorrhoids.  Has associated intermittent watery/soft diarrhea x 3 episodes since yesterday.  Notes a small amount of blood with wiping.  No blood in the stool or blood in the toilet.  Notes subjective fever.  Has tried Advil at home for symptoms.  Currently in pain management.  Denies any new foods or medications.  Denies sick contacts.  Denies abdominal pain, nausea, vomiting, constipation.   The history is provided by the patient. No language interpreter was used.       Home Medications Prior to Admission medications   Medication Sig Start Date End Date Taking? Authorizing Provider  hydrocortisone (ANUSOL-HC) 2.5 % rectal cream Place 1 Application rectally 2 (two) times daily. 11/07/22  Yes Bronislaus Verdell A, PA-C  aspirin 81 MG chewable tablet Chew 1 tablet by mouth daily.    [provider]  atorvastatin (LIPITOR) 20 MG tablet TAKE 1 TABLET(20 MG) BY MOUTH DAILY Patient taking differently: Take 40 mg by mouth daily. 06/21/21   [name], [name], NP  clopidogrel (PLAVIX) 75 MG tablet TAKE 1 TABLET(75 MG) BY MOUTH DAILY 08/07/22   Tolia, Sunit, DO  Continuous Blood Gluc Sensor (DEXCOM G6 SENSOR) MISC Use to test blood sugar once daily. Dx: E11.42 03/13/21   [name], [name], NP  Continuous Blood Gluc Transmit (DEXCOM G6 TRANSMITTER) MISC Use to test blood sugar once daily. Dx: E11.42 03/13/21   [name], Donalee Citrin, NP  ENTRESTO 24-26 MG TAKE 1 TABLET BY MOUTH TWICE DAILY 12/18/21   Tolia, Sunit, DO  ezetimibe (ZETIA) 10 MG tablet TAKE 1 TABLET(10 MG) BY MOUTH DAILY 06/15/21   Tolia,  Sunit, DO  FARXIGA 10 MG TABS tablet TAKE 1 TABLET BY MOUTH DAILY BEFORE BREAKFAST 02/18/22   Tolia, Sunit, DO  furosemide (LASIX) 40 MG tablet Take 40 mg by mouth every morning. 07/27/21   [provider]  gabapentin (NEURONTIN) 100 MG capsule Take 100 mg by mouth 3 (three) times daily. 07/06/21   [provider]  glucose blood (ACCU-CHEK AVIVA PLUS) test strip Use to test blood sugar 3 times daily. Dx: E11.42 01/25/20   [name], [name], NP  insulin aspart (NOVOLOG FLEXPEN) 100 UNIT/ML FlexPen Use depending on blood sugar: <150 take 0 units, 150-200 take 2 units, 200-250 take 4 units, 250-300 take 6 units, 300-350 take 8 units, >350 take 10 units 04/05/22     insulin detemir (LEVEMIR) 100 UNIT/ML FlexPen Inject 22 Units into the skin at bedtime. 12/19/20   [name], [name], NP  insulin lispro (HUMALOG KWIKPEN) 100 UNIT/ML KwikPen Inject 8-10 units into the skin per sliding scale three times daily with meals. 08/04/20   [name], [name], NP  Insulin Pen Needle (BD PEN NEEDLE NANO 2ND GEN) 32G X 4 MM MISC USE 1 PEN THREE TIMES DAILY AS DIRECTED 04/08/22   [name], [name], NP  isosorbide mononitrate (IMDUR) 60 MG 24 hr tablet TAKE 1 TABLET(60 MG) BY MOUTH DAILY 04/23/21   [name], [name], NP  ketoconazole (NIZORAL) 2 % cream Apply to both feet  and between toes once daily for 6 weeks. 11/21/21   Freddie Breech, DPM  metoprolol tartrate (LOPRESSOR) 25 MG tablet TAKE 1 TABLET(25 MG) BY MOUTH TWICE DAILY 12/21/21   [name], [name], NP  nitroGLYCERIN (NITROSTAT) 0.4 MG SL tablet Dissolve one tablet under tongue as needed for chest pain. 03/14/20   [name], [name], NP  NOVOLOG FLEXPEN 100 UNIT/ML FlexPen  09/19/20   [provider]  oxyCODONE-acetaminophen (PERCOCET) 7.5-325 MG tablet Take 1 tablet by mouth every 8 (eight) hours as needed for pain 04/05/22     pregabalin (LYRICA) 100 MG capsule Take 100 mg by mouth daily. 10/04/21   [provider]  REPATHA SURECLICK 140 MG/ML  SOAJ INJECT 140 MG INTO SKIN EVERY 14 DAYS 11/06/22   Tolia, Sunit, DO  tamsulosin (FLOMAX) 0.4 MG CAPS capsule Take 1 capsule by mouth daily. 09/24/21   [provider]  TRULICITY 0.75 MG/0.5ML SOPN Inject 0.75 mg into the skin once a week. 10/04/21   [provider]      Allergies    Patient has no known allergies.    Review of Systems   Review of Systems  Constitutional:  Positive for fever (subjective).  Gastrointestinal:  Positive for diarrhea. Negative for abdominal pain, constipation, nausea and vomiting.  All other systems reviewed and are negative.   Physical Exam Updated Vital Signs BP (!) 144/78   Pulse 76   Temp 97.9 F (36.6 C)   Resp 17   Wt 121.3 kg   SpO2 99%   BMI 41.88 kg/m  Physical Exam Vitals and nursing note reviewed.  Constitutional:      General: He is not in acute distress.    Appearance: Normal appearance.  Eyes:     General: No scleral icterus.    Extraocular Movements: Extraocular movements intact.  Cardiovascular:     Rate and Rhythm: Normal rate and regular rhythm.     Pulses: Normal pulses.     Heart sounds: Normal heart sounds.  Pulmonary:     Effort: Pulmonary effort is normal. No respiratory distress.     Breath sounds: Normal breath sounds.  Abdominal:     General: Bowel sounds are normal.     Palpations: Abdomen is soft. There is no mass.     Tenderness: There is no abdominal tenderness.  Genitourinary:    Comments: RN chaperone Sheryn Bison) present for rectal exam. No appreciable tenderness to palpation noted to rectum.  No appreciable external hemorrhoid, internal hemorrhoid, mass, anal fissure, abnormal anal tone.  No gross blood noted to rectal vault.  Skin breakdown noted to gluteus region surrounding rectal area.  No appreciable drainage noted to the area.  No tenderness to palpation noted to the area. Musculoskeletal:        General: Normal range of motion.     Cervical back: Neck supple.  Skin:     General: Skin is warm and dry.     Findings: No rash.  Neurological:     Mental Status: He is alert.     Sensory: Sensation is intact.     Motor: Motor function is intact.  Psychiatric:        Behavior: Behavior normal.     ED Results / Procedures / Treatments   Labs (all labs ordered are listed, but only abnormal results are displayed) Labs Reviewed - No data to display  EKG None  Radiology No results found.  Procedures Procedures    Medications Ordered in ED Medications  liver oil-zinc oxide (DESITIN) 40 % ointment ( Topical Given 11/07/22 0837)    ED Course/ Medical Decision Making/ A&P                             Medical Decision Making Risk OTC drugs.   Pt presents with concerns for possible hemorrhoids since yesterday.  Has a previous history of hemorrhoids. Vital signs, patient afebrile. On exam, pt with RN chaperone Sheryn Bison) present for rectal exam. No appreciable tenderness to palpation noted to rectum.  No appreciable external hemorrhoid, internal hemorrhoid, mass, anal fissure, abnormal anal tone.  No gross blood noted to rectal vault.  Skin breakdown noted to gluteus region surrounding rectal area.  No appreciable drainage noted to the area.  No tenderness to palpation noted to the area. No acute cardiovascular, respiratory, abdominal exam findings. Differential diagnosis includes internal hemorrhoid, external hemorrhoid, anal fissure, contact dermatitis due to moisture.    Co morbidities that complicate the patient evaluation: MI, hypertension   Medications:  I ordered medication including Desitin cream for symptom management I have reviewed the patients home medicines and have made adjustments as needed   Disposition: Presenting suspicious for likely dermatitis due to increased moisture.  Doubt concerns at this time for anal fissure, internal hemorrhoid, external hemorrhoid.  No appreciable blood noted on exam today.  Will treat patient  with prescription for Anusol cream.  Patient also provided with tube of Desitin ointment today to aid with skin breakdown. After consideration of the diagnostic results and the patients response to treatment, I feel that the patient would benefit from Discharge home.  Instructed patient to follow-up with primary care provider regarding today's ED visit.  Supportive care measures and strict return precautions discussed with patient at bedside. Pt acknowledges and verbalizes understanding. Pt appears safe for discharge. Follow up as indicated in discharge paperwork.    This chart was dictated using voice recognition software, Dragon. Despite the best efforts of this provider to proofread and correct errors, errors may still occur which can change documentation meaning.   Final Clinical Impression(s) / ED Diagnoses Final diagnoses:  Dermatitis associated with moisture    Rx / DC Orders ED Discharge Orders          Ordered    hydrocortisone (ANUSOL-HC) 2.5 % rectal cream  2 times daily        11/07/22 0846              Ivonna Kinnick A, PA-C 11/07/22 0850    Rexford Maus, DO 11/07/22 [phone]

## 2022-11-07 NOTE — ED Notes (Signed)
Pt up to the restroom. He is able to ambulate on his own.

## 2022-11-11 ENCOUNTER — Ambulatory Visit: Payer: 59 | Admitting: Cardiology

## 2022-11-11 ENCOUNTER — Encounter: Payer: Self-pay | Admitting: Cardiology

## 2022-11-11 VITALS — BP 128/67 | HR 81 | Ht 67.0 in | Wt 275.0 lb

## 2022-11-11 DIAGNOSIS — Z87891 Personal history of nicotine dependence: Secondary | ICD-10-CM

## 2022-11-11 DIAGNOSIS — Z8673 Personal history of transient ischemic attack (TIA), and cerebral infarction without residual deficits: Secondary | ICD-10-CM

## 2022-11-11 DIAGNOSIS — I2510 Atherosclerotic heart disease of native coronary artery without angina pectoris: Secondary | ICD-10-CM

## 2022-11-11 DIAGNOSIS — Z955 Presence of coronary angioplasty implant and graft: Secondary | ICD-10-CM

## 2022-11-11 DIAGNOSIS — E1159 Type 2 diabetes mellitus with other circulatory complications: Secondary | ICD-10-CM

## 2022-11-11 DIAGNOSIS — I6523 Occlusion and stenosis of bilateral carotid arteries: Secondary | ICD-10-CM

## 2022-11-11 DIAGNOSIS — E7800 Pure hypercholesterolemia, unspecified: Secondary | ICD-10-CM

## 2022-11-11 DIAGNOSIS — I5032 Chronic diastolic (congestive) heart failure: Secondary | ICD-10-CM

## 2022-11-11 DIAGNOSIS — Z951 Presence of aortocoronary bypass graft: Secondary | ICD-10-CM

## 2022-11-11 NOTE — Progress Notes (Signed)
David Burton Date of Birth: 02-24-1946 MRN: [id number] Primary Care Provider:[name], Donalee Citrin, NP Former Cardiology Providers: Dr. Yates Decamp, Altamese Maunaloa, APRN, FNP-C Primary Cardiologist: Tessa Lerner, DO, [facility] (established care 01/11/2020)  Date: 11/11/22 Last Office Visit: 05/21/2022  Chief Complaint  Patient presents with   Chronic heart failure with preserved ejection fraction   Follow-up   Coronary Artery Disease    HPI  David Burton is a 76 y.o.  male whose  past medical history and cardiovascular risk factors include: Chronic HFpEF, NSTEMI in Dec 2018, s/p CABG x 4 on 05/08/2017 (LIMA to LAD, SVG to PDA, SVG to Diagonal, SVG to Ramus. Surgery done at [facility], [place]), diabetes mellitus type 2 with peripheral neuropathy, hypertension, hypercholesterolemia, CVA (09/2019), advanced age.  Patient is being followed by the practice for management of his CAD and HFpEF.  Over the last 6 months patient denies anginal chest pain or heart failure symptoms.  No hospitalizations for cardiovascular reasons.  No recent labs available for review.  He has gained approximately 7 pounds over the last 6 months likely secondary to dietary indiscretion.   FUNCTIONAL STATUS: He walks atleast 0.5 miles per day.    ALLERGIES: No Known Allergies  MEDICATION LIST PRIOR TO VISIT: Current Outpatient Medications on File Prior to Visit  Medication Sig Dispense Refill   aspirin 81 MG chewable tablet Chew 1 tablet by mouth daily.     atorvastatin (LIPITOR) 20 MG tablet TAKE 1 TABLET(20 MG) BY MOUTH DAILY (Patient taking differently: Take 20 mg by mouth daily.) 90 tablet 1   clopidogrel (PLAVIX) 75 MG tablet TAKE 1 TABLET(75 MG) BY MOUTH DAILY 90 tablet 0   Continuous Blood Gluc Sensor (DEXCOM G6 SENSOR) MISC Use to test blood sugar once daily. Dx: E11.42 9 each 3   Continuous Blood Gluc Transmit (DEXCOM G6 TRANSMITTER) MISC Use to test blood sugar once daily. Dx: E11.42 1 each 0   ENTRESTO  24-26 MG TAKE 1 TABLET BY MOUTH TWICE DAILY 60 tablet 0   ezetimibe (ZETIA) 10 MG tablet TAKE 1 TABLET(10 MG) BY MOUTH DAILY 90 tablet 3   FARXIGA 10 MG TABS tablet TAKE 1 TABLET BY MOUTH DAILY BEFORE BREAKFAST 90 tablet 0   furosemide (LASIX) 40 MG tablet Take 40 mg by mouth every morning.     gabapentin (NEURONTIN) 100 MG capsule Take 100 mg by mouth 3 (three) times daily.     glucose blood (ACCU-CHEK AVIVA PLUS) test strip Use to test blood sugar 3 times daily. Dx: E11.42 300 each 1   hydrocortisone (ANUSOL-HC) 2.5 % rectal cream Place 1 Application rectally 2 (two) times daily. 30 g 0   insulin aspart (NOVOLOG FLEXPEN) 100 UNIT/ML FlexPen Use depending on blood sugar: <150 take 0 units, 150-200 take 2 units, 200-250 take 4 units, 250-300 take 6 units, 300-350 take 8 units, >350 take 10 units 30 mL 3   insulin detemir (LEVEMIR) 100 UNIT/ML FlexPen Inject 22 Units into the skin at bedtime. 30 mL 1   insulin lispro (HUMALOG KWIKPEN) 100 UNIT/ML KwikPen Inject 8-10 units into the skin per sliding scale three times daily with meals. 45 mL 3   Insulin Pen Needle (BD PEN NEEDLE NANO 2ND GEN) 32G X 4 MM MISC USE 1 PEN THREE TIMES DAILY AS DIRECTED 100 each 3   isosorbide mononitrate (IMDUR) 60 MG 24 hr tablet TAKE 1 TABLET(60 MG) BY MOUTH DAILY 90 tablet 1   ketoconazole (NIZORAL) 2 % cream Apply to both  feet and between toes once daily for 6 weeks. 60 g 1   metoprolol tartrate (LOPRESSOR) 25 MG tablet TAKE 1 TABLET(25 MG) BY MOUTH TWICE DAILY 30 tablet 0   nitroGLYCERIN (NITROSTAT) 0.4 MG SL tablet Dissolve one tablet under tongue as needed for chest pain. 30 tablet 3   NOVOLOG FLEXPEN 100 UNIT/ML FlexPen      oxyCODONE (ROXICODONE) 15 MG immediate release tablet Take 15 mg by mouth every 6 (six) hours.     REPATHA SURECLICK 140 MG/ML SOAJ INJECT 140 MG INTO SKIN EVERY 14 DAYS 6 mL 3   tamsulosin (FLOMAX) 0.4 MG CAPS capsule Take 1 capsule by mouth daily.     tiZANidine (ZANAFLEX) 4 MG tablet Take 4  mg by mouth 3 (three) times daily.     TRULICITY 0.75 MG/0.5ML SOPN Inject 0.75 mg into the skin once a week.     No current facility-administered medications on file prior to visit.    PAST MEDICAL HISTORY: Past Medical History:  Diagnosis Date   Bell palsy    CKD (chronic kidney disease), stage III (HCC) 12/16/2018   Coronary artery disease involving native coronary artery of native heart with unstable angina pectoris (HCC) 12/16/2018   GERD (gastroesophageal reflux disease)    Hypertension    Hypertension with heart disease 12/16/2018   Myocardial infarction (HCC)    Pure hypercholesterolemia 12/16/2018   Stroke (HCC)    Type 2 diabetes, controlled, with peripheral neuropathy (HCC) 12/16/2018    PAST SURGICAL HISTORY: Past Surgical History:  Procedure Laterality Date   CARDIAC SURGERY     COLONOSCOPY WITH PROPOFOL N/A 01/02/2018   Procedure: COLONOSCOPY WITH PROPOFOL;  Surgeon: Jeani Hawking, MD;  Location: [facility];  Service: Endoscopy;  Laterality: N/A;   CORONARY STENT INTERVENTION N/A 01/26/2019   Procedure: CORONARY STENT INTERVENTION;  Surgeon: Yates Decamp, MD;  Location: [facility];  Service: Cardiovascular;  Laterality: N/A;   LEFT HEART CATH AND CORS/GRAFTS ANGIOGRAPHY N/A 01/05/2019   Procedure: LEFT HEART CATH AND CORS/GRAFTS ANGIOGRAPHY;  Surgeon: Yates Decamp, MD;  Location: [facility] CV [facility];  Service: Cardiovascular;  Laterality: N/A;   POLYPECTOMY  01/02/2018   Procedure: POLYPECTOMY;  Surgeon: Jeani Hawking, MD;  Location: [facility];  Service: Endoscopy;;   stents      FAMILY HISTORY: The patient's family history includes Cancer in his mother; Heart attack in his father.   SOCIAL HISTORY:  The patient  reports that he quit smoking about 19 years ago. His smoking use included cigarettes. He has a 5.00 pack-year smoking history. He has never used smokeless tobacco. He reports current alcohol use. He reports that he does not use drugs.  Review of Systems   Constitutional: Positive for weight gain.  Cardiovascular:  Negative for chest pain, claudication, dyspnea on exertion, leg swelling, near-syncope, orthopnea, palpitations, paroxysmal nocturnal dyspnea and syncope.  Respiratory:  Negative for shortness of breath.    PHYSICAL EXAM:    11/11/2022    2:09 PM 11/07/2022    8:49 AM 11/07/2022    6:23 AM  Vitals with BMI  Height 5\' 7"     Weight 275 lbs  267 lbs 7 oz  BMI 43.06    Systolic 128 114   Diastolic 67 70   Pulse 81 73    Physical Exam  Constitutional: No distress.  Age appropriate, hemodynamically stable.   Neck: No JVD present.  Cardiovascular: Normal rate, regular rhythm, S1 normal, S2 normal, intact distal pulses and normal pulses. Exam  reveals no gallop, no S3 and no S4.  No murmur heard. Pulmonary/Chest: Effort normal and breath sounds normal. No stridor. He has no wheezes. He has no rales.  Abdominal: Soft. Bowel sounds are normal. He exhibits no distension. There is no abdominal tenderness.  Obesity  Musculoskeletal:        General: No edema.     Cervical back: Neck supple.  Neurological: He is alert and oriented to person, place, and time. He has intact cranial nerves (2-12).  Skin: Skin is warm and moist.   RADIOLOGY: MR Brain w/o contrast:  IMPRESSION: 1. Mildly motion degraded exam. 2. Confirmed 17 mm acute/early subacute infarct within the right pons. 3. Background mild chronic small vessel ischemic disease within the cerebral white matter and pons. Tiny chronic lacunar infarct within the left cerebellum. 4. Mild generalized parenchymal atrophy. 5. Mild paranasal sinus mucosal thickening. Trace left mastoid effusion.  CARDIAC DATABASE: Coronary artery bypass grafting: s/p CABG x 4 on 05/08/2017 (LIMA to LAD, SVG to PDA, SVG to Diagonal, SVG to Ramus.  EKG: 11/19/2021: NSR, 65 bpm, consider old inferior infarct, without underlying injury pattern. 05/21/2022: Sinus rhythm, 65 bpm, incomplete right bundle  branch block, left axis, old inferior infarct. November 11, 2022: Sinus rhythm, 78 bpm, incomplete right bundle branch block, consider old inferior infarct.  No significant change compared to prior tracing  Echocardiogram: 08/21/2021:  Normal LV systolic function with visual EF 55-60%. Left ventricle cavity is normal in size. Normal left ventricular wall thickness. Normal global wall motion. Doppler evidence of grade II (pseudonormal) diastolic dysfunction, elevated LAP.  Mild (Grade I) mitral regurgitation.  Mild tricuspid regurgitation. No evidence of pulmonary hypertension. RVSP measures 37 mmHg.  Compared to study 09/17/2019 mild MR and TR are new otherwise no significant change.   Stress Testing:  Lexiscan myoview stress test 10/10/2017: 1. Lexiscan stress test was performed. Exercise capacity was not assessed. Stress symptoms included dizziness. Normal blood pressure. The resting electrocardiogram demonstrated normal sinus rhythm, normal resting conduction, no resting arrhythmias and normal rest repolarization. Stress EKG is non diagnostic for ischemia as it is a pharmacologic stress. 2. The overall quality of the study is good. Left ventricular cavity is noted to be normal on the rest and stress studies. Gated SPECT images reveal normal myocardial thickening and wall motion, except mild inferior hypokinesis. The left ventricular ejection fraction was calculated or visually estimated to be 53%. SPECT stress images demonstrate medium size, moderate intensity perfusion defect in basal to apical inferior, inferolateral myocardium. SPECT rest images demonstrate medium size, mild intensity perfusion defect in basal to apical inferior myocardium. This suggests inferior infarct with periinfarct mild ischemia, as well as inferolateral ischemia. (LCx/PDA territory). 3. Intermediate risk study.  Heart Catheterization: Coronary angiogram 01/05/2019: Normal LVEDP, severe native vessel disease.  High-grade  proximal RCA 90 to 95% stenosis followed by 70 to 80% stenosis in the in-stent restenotic proximal segment.  Large RCA.  SVG to RCA could not be visualized in spite of multiple catheter attempts, and presumed occluded. Left main is large and mildly diseased but patent.  LAD is occluded in the midsegment after the origin of D1, which has ostial 90% stenosis.  SVG to D1 is patent.  LIMA to LAD is patent. Ramus intermediate: Large vessel, mild to moderate calcific 80 to 85% stenosis in the proximal segment.  SVG to ramus intermediate is occluded. Circumflex: High-grade 99%/subtotal occlusion in the ostium.  Moderate to large sized vessel.  Small marginals.  Coronary intervention 01/26/2019:  Successful PTCA and stenting of the proximal mid and mid to distal RCA with implantation of 2 overlapping 3.0 x 38 mm Promus Synergy DES, stenosis reduced from 90% to 0% with TIMI-3 to TIMI-3 flow.     Recommendation: With medical high-grade ramus intermediate and circumflex stenosis, he has excellent brisk flow there and ischemia was in the inferior wall, however if he still persist to have recurrence of angina pectoris we will schedule him for complex intervention to the left coronary system.  In view of his underlying comorbidity, would recommend medical therapy  Carotid duplex: 09/17/2019: Right Carotid: Velocities in the right ICA are consistent with a 1-39% stenosis.  Left Carotid: Velocities in the left ICA are consistent with a 1-39% stenosis.  Vertebrals: Left vertebral artery demonstrates antegrade flow. Right vertebral artery was not visualized  14 - day [facility]iac Ambulatory Telemetry.  Dominant rhythm normal sinus. Heart rate 59-156 bpm.  Avg HR 85 bpm. No atrial fibrillation, ventricular tachycardia, high grade AV block, pauses (3 seconds or longer). Total ventricular and supraventricular ectopic burden <1%. Two episode of supraventricular tachycardia. Longest and fastest episode was 17 beats at  maximum HR of 156 bpm. Patient triggered events: 0.  LABORATORY DATA:    Latest Ref Rng & Units 12/13/2020    1:28 PM 09/15/2020    1:47 AM 09/14/2020    4:14 AM  CBC  WBC 3.8 - 10.8 Thousand/uL 7.8  10.5  8.9   Hemoglobin 13.2 - 17.1 g/dL 16.1  09.6  04.5   Hematocrit 38.5 - 50.0 % 41.3  35.0  33.5   Platelets 140 - 400 Thousand/uL 141  178  156        Latest Ref Rng & Units 10/19/2021   10:05 AM 10/10/2021   10:25 AM 09/25/2021   10:39 AM  CMP  Glucose 70 - 99 mg/dL 409  811  914   BUN 8 - 27 mg/dL 29  34  33   Creatinine 0.76 - 1.27 mg/dL 7.82  9.56  2.13   Sodium 134 - 144 mmol/L 139  138  141   Potassium 3.5 - 5.2 mmol/L 4.5  4.2  4.9   Chloride 96 - 106 mmol/L 101  98  110   CO2 20 - 29 mmol/L 20  20  17    Calcium 8.6 - 10.2 mg/dL 9.6  9.9  9.0    Last lipids Lab Results  Component Value Date   CHOL 80 (L) 07/10/2021   HDL 40 07/10/2021   LDLCALC 18 07/10/2021   LDLDIRECT 22 07/10/2021   TRIG 122 07/10/2021   CHOLHDL 2.1 12/13/2020    Lab Results  Component Value Date   HGBA1C 8.5 (H) 12/13/2020   HGBA1C 8.1 (H) 09/06/2020   HGBA1C 6.6 (H) 05/08/2020   No components found for: "NTPROBNP" Lab Results  Component Value Date   TSH 4.89 (H) 12/13/2020   TSH 1.63 09/06/2020   TSH 3.02 05/08/2020    Cardiac Panel (last 3 results) No results for input(s): "CKTOTAL", "CKMB", "TROPONINIHS", "RELINDX" in the last 72 hours.  IMPRESSION:    ICD-10-CM   1. Chronic heart failure with preserved ejection fraction (HFpEF) (HCC)  I50.32 EKG 12-Lead    Lipid Panel With LDL/HDL Ratio    LDL cholesterol, direct    CMP14+EGFR    Pro b natriuretic peptide (BNP)    2. Coronary artery disease involving native coronary artery of native heart without angina pectoris  I25.10 Lipid Panel With LDL/HDL Ratio  LDL cholesterol, direct    CMP14+EGFR    3. S/P primary angioplasty with coronary stent  Z95.5 Lipid Panel With LDL/HDL Ratio    LDL cholesterol, direct    CMP14+EGFR     4. S/P CABG x 4  Z95.1 Lipid Panel With LDL/HDL Ratio    LDL cholesterol, direct    CMP14+EGFR    5. Pure hypercholesterolemia  E78.00 Lipid Panel With LDL/HDL Ratio    LDL cholesterol, direct    CMP14+EGFR    6. Atherosclerosis of both carotid arteries  I65.23 PCV CAROTID DUPLEX (BILATERAL)    7. Type 2 diabetes mellitus with other circulatory complication, with long-term current use of insulin (HCC)  E11.59    Z79.4     8. Former smoker  Z87.891     9. History of stroke  Z86.73         RECOMMENDATIONS: Lenzie Barbieri is a 77 y.o. male whose past medical history and cardiovascular risk factors include: NSTEMI in Dec 2018, s/p CABG x 4 on 05/08/2017 (LIMA to LAD, SVG to PDA, SVG to Diagonal, SVG to Ramus. Surgery done at [facility], [place]), diabetes mellitus type 2 with peripheral neuropathy, hypertension, hypercholesterolemia, CVA (09/2019), advanced age.  Chronic heart failure with preserved ejection fraction (HFpEF) (HCC) Stage C, NYHA class II No recent hospitalizations for CHF since last office encounter. Weight has trended up since last visit-secondary to dietary indiscretion. Medications reconciled. Will check CMP and NT proBNP. Medication reconciliation as per his memory.  No medication bottles or medication list available for review. No additional testing warranted at this time  Coronary artery disease involving native coronary artery of native heart without angina pectoris / s/p primary angioplasty with coronary stent / S/P CABG x 4 Denies angina pectoris.   No sublingual nitroglycerin tablets since last office visit. EKG nonischemic. Last hemoglobin A1c not at goal.  Have asked her to follow-up with his PCP for further recommendations. Last LDL at goal. Check fasting lipids and direct LDL Continue PCSK9 inhibitors including Zetia and statin therapy.  Pure hypercholesterolemia: Most recent LDL 22 mg/dL as of February 4098. Check fasting lipids. Continue  current medical therapy. Does not endorse myalgias.  Bilateral carotid artery atherosclerosis: Last carotid duplex in 2021 noted disease burden to be <40% bilaterally. Repeat duplex prior to next visit.  Reviewed prior echo, stress test , and carotid duplex results as part of medical decision making today.  FINAL MEDICATION LIST END OF ENCOUNTER:   Current Outpatient Medications:    aspirin 81 MG chewable tablet, Chew 1 tablet by mouth daily., Disp: , Rfl:    atorvastatin (LIPITOR) 20 MG tablet, TAKE 1 TABLET(20 MG) BY MOUTH DAILY (Patient taking differently: Take 20 mg by mouth daily.), Disp: 90 tablet, Rfl: 1   clopidogrel (PLAVIX) 75 MG tablet, TAKE 1 TABLET(75 MG) BY MOUTH DAILY, Disp: 90 tablet, Rfl: 0   Continuous Blood Gluc Sensor (DEXCOM G6 SENSOR) MISC, Use to test blood sugar once daily. Dx: E11.42, Disp: 9 each, Rfl: 3   Continuous Blood Gluc Transmit (DEXCOM G6 TRANSMITTER) MISC, Use to test blood sugar once daily. Dx: E11.42, Disp: 1 each, Rfl: 0   ENTRESTO 24-26 MG, TAKE 1 TABLET BY MOUTH TWICE DAILY, Disp: 60 tablet, Rfl: 0   ezetimibe (ZETIA) 10 MG tablet, TAKE 1 TABLET(10 MG) BY MOUTH DAILY, Disp: 90 tablet, Rfl: 3   FARXIGA 10 MG TABS tablet, TAKE 1 TABLET BY MOUTH DAILY BEFORE BREAKFAST, Disp: 90 tablet, Rfl: 0   furosemide (  LASIX) 40 MG tablet, Take 40 mg by mouth every morning., Disp: , Rfl:    gabapentin (NEURONTIN) 100 MG capsule, Take 100 mg by mouth 3 (three) times daily., Disp: , Rfl:    glucose blood (ACCU-CHEK AVIVA PLUS) test strip, Use to test blood sugar 3 times daily. Dx: E11.42, Disp: 300 each, Rfl: 1   hydrocortisone (ANUSOL-HC) 2.5 % rectal cream, Place 1 Application rectally 2 (two) times daily., Disp: 30 g, Rfl: 0   insulin aspart (NOVOLOG FLEXPEN) 100 UNIT/ML FlexPen, Use depending on blood sugar: <150 take 0 units, 150-200 take 2 units, 200-250 take 4 units, 250-300 take 6 units, 300-350 take 8 units, >350 take 10 units, Disp: 30 mL, Rfl: 3   insulin  detemir (LEVEMIR) 100 UNIT/ML FlexPen, Inject 22 Units into the skin at bedtime., Disp: 30 mL, Rfl: 1   insulin lispro (HUMALOG KWIKPEN) 100 UNIT/ML KwikPen, Inject 8-10 units into the skin per sliding scale three times daily with meals., Disp: 45 mL, Rfl: 3   Insulin Pen Needle (BD PEN NEEDLE NANO 2ND GEN) 32G X 4 MM MISC, USE 1 PEN THREE TIMES DAILY AS DIRECTED, Disp: 100 each, Rfl: 3   isosorbide mononitrate (IMDUR) 60 MG 24 hr tablet, TAKE 1 TABLET(60 MG) BY MOUTH DAILY, Disp: 90 tablet, Rfl: 1   ketoconazole (NIZORAL) 2 % cream, Apply to both feet and between toes once daily for 6 weeks., Disp: 60 g, Rfl: 1   metoprolol tartrate (LOPRESSOR) 25 MG tablet, TAKE 1 TABLET(25 MG) BY MOUTH TWICE DAILY, Disp: 30 tablet, Rfl: 0   nitroGLYCERIN (NITROSTAT) 0.4 MG SL tablet, Dissolve one tablet under tongue as needed for chest pain., Disp: 30 tablet, Rfl: 3   NOVOLOG FLEXPEN 100 UNIT/ML FlexPen, , Disp: , Rfl:    oxyCODONE (ROXICODONE) 15 MG immediate release tablet, Take 15 mg by mouth every 6 (six) hours., Disp: , Rfl:    REPATHA SURECLICK 140 MG/ML SOAJ, INJECT 140 MG INTO SKIN EVERY 14 DAYS, Disp: 6 mL, Rfl: 3   tamsulosin (FLOMAX) 0.4 MG CAPS capsule, Take 1 capsule by mouth daily., Disp: , Rfl:    tiZANidine (ZANAFLEX) 4 MG tablet, Take 4 mg by mouth 3 (three) times daily., Disp: , Rfl:    TRULICITY 0.75 MG/0.5ML SOPN, Inject 0.75 mg into the skin once a week., Disp: , Rfl:   Orders Placed This Encounter  Procedures   Lipid Panel With LDL/HDL Ratio   LDL cholesterol, direct   ZOX09+UEAV   Pro b natriuretic peptide (BNP)   EKG 12-Lead   PCV CAROTID DUPLEX (BILATERAL)   --Continue cardiac medications as reconciled in final medication list. --Return in about 6 months (around 05/16/2023) for Follow up, CAD, heart failure management.. Or sooner if needed. --Continue follow-up with your primary care physician regarding the management of your other chronic comorbid conditions.  Patient's  questions and concerns were addressed to his satisfaction. He voices understanding of the instructions provided during this encounter.   This note was created using a voice recognition software as a result there may be grammatical errors inadvertently enclosed that do not reflect the nature of this encounter. Every attempt is made to correct such errors.  Tessa Lerner, [place], [facility]  Pager:  [phone] Office: [phone]

## 2022-11-21 ENCOUNTER — Ambulatory Visit: Payer: Medicare Other | Admitting: Cardiology

## 2022-12-03 ENCOUNTER — Other Ambulatory Visit: Payer: Self-pay | Admitting: Cardiology

## 2022-12-04 LAB — CMP14+EGFR
ALT: 38 IU/L (ref 0–44)
AST: 30 IU/L (ref 0–40)
Albumin: 4.1 g/dL (ref 3.8–4.8)
Alkaline Phosphatase: 101 IU/L (ref 44–121)
BUN/Creatinine Ratio: 14 (ref 10–24)
BUN: 28 mg/dL — ABNORMAL HIGH (ref 8–27)
Bilirubin Total: 0.5 mg/dL (ref 0.0–1.2)
CO2: 21 mmol/L (ref 20–29)
Calcium: 9.4 mg/dL (ref 8.6–10.2)
Chloride: 104 mmol/L (ref 96–106)
Creatinine, Ser: 1.97 mg/dL — ABNORMAL HIGH (ref 0.76–1.27)
Globulin, Total: 2.7 g/dL (ref 1.5–4.5)
Glucose: 194 mg/dL — ABNORMAL HIGH (ref 70–99)
Potassium: 4.5 mmol/L (ref 3.5–5.2)
Sodium: 140 mmol/L (ref 134–144)
Total Protein: 6.8 g/dL (ref 6.0–8.5)
eGFR: 35 mL/min/{1.73_m2} — ABNORMAL LOW (ref >59)

## 2022-12-04 LAB — LIPID PANEL WITH LDL/HDL RATIO
Cholesterol, Total: 128 mg/dL (ref 100–199)
HDL: 49 mg/dL (ref >39)
LDL Chol Calc (NIH): 53 mg/dL (ref 0–99)
LDL/HDL Ratio: 1.1 ratio (ref 0.0–3.6)
Triglycerides: 150 mg/dL — ABNORMAL HIGH (ref 0–149)
VLDL Cholesterol Cal: 26 mg/dL (ref 5–40)

## 2022-12-04 LAB — PRO B NATRIURETIC PEPTIDE: NT-Pro BNP: 832 pg/mL — ABNORMAL HIGH (ref 0–486)

## 2022-12-04 LAB — LDL CHOLESTEROL, DIRECT: LDL Direct: 58 mg/dL (ref 0–99)

## 2022-12-09 ENCOUNTER — Telehealth: Payer: Self-pay | Admitting: Cardiology

## 2022-12-09 ENCOUNTER — Encounter: Payer: 59 | Admitting: Cardiology

## 2022-12-09 ENCOUNTER — Other Ambulatory Visit: Payer: Self-pay

## 2022-12-09 DIAGNOSIS — I5032 Chronic diastolic (congestive) heart failure: Secondary | ICD-10-CM

## 2022-12-09 DIAGNOSIS — E7800 Pure hypercholesterolemia, unspecified: Secondary | ICD-10-CM

## 2022-12-09 DIAGNOSIS — I2510 Atherosclerotic heart disease of native coronary artery without angina pectoris: Secondary | ICD-10-CM

## 2022-12-09 DIAGNOSIS — Z794 Long term (current) use of insulin: Secondary | ICD-10-CM

## 2022-12-09 DIAGNOSIS — D696 Thrombocytopenia, unspecified: Secondary | ICD-10-CM

## 2022-12-09 MED ORDER — FENOFIBRATE 145 MG PO TABS: 145.0000 mg | ORAL_TABLET | Freq: Every day | ORAL | 3 refills | Status: DC

## 2022-12-09 NOTE — Progress Notes (Signed)
Pt has been taking advil as needed, I added this to his list. Also made him aware of lasix every other day and sent his fenofibrate and let him know to get his labs drawn n 1 week

## 2022-12-09 NOTE — Telephone Encounter (Signed)
Discussed with patient's daughter, she states that he has been very compliant with all the medications, dementia has very mild and does not think that research would interfere with his lifestyle or medication changes and would like to continue to be screened and enrolled into the trial if he is eligible.  Hence patient will be screened today.  :[name]" (Inclisiran 284 mg SQ every 6 months for aggressive LDL lowering in ACS

## 2022-12-09 NOTE — Progress Notes (Signed)
:  [name]" (Inclisiran 284 mg SQ every 6 months for aggressive LDL lowering in ACS    ICD-10-CM   1. Coronary artery disease involving native coronary artery of native heart without angina pectoris  I25.10     2. Type 2 diabetes mellitus with other circulatory complication, with long-term current use of insulin (HCC)  E11.59    Z79.4     3. Pure hypercholesterolemia  E78.00     4. Thrombocytopenia (HCC)  D69.6 CBC    5. Chronic heart failure with preserved ejection fraction (HFpEF) (HCC)  I50.32 B Nat Peptide       Patient screened 12/11/22   Prescreening labs reveal platelet count of 114,000.  Previously normal, will repeat CBC.  He has BMP orders pending for future.

## 2022-12-09 NOTE — Telephone Encounter (Signed)
:  [name]" (Inclisiran 284 mg SQ every 6 months for aggressive LDL lowering in ACS  Patient felt not to be candidate due to advancing dementia, upon presentation for screening, patient was not sure of the medication he is taking and also advised [place] that he has been diagnosed with dementia.  Hence patient felt not to be a candidate for the trial.

## 2022-12-10 NOTE — Telephone Encounter (Signed)
Please screen him for the mentioned trial. But he is already on PCSK9 inhibitor.   Dr. Odis Hollingshead

## 2022-12-11 NOTE — Addendum Note (Signed)
Addended by: Delrae Rend on: 12/11/2022 10:29 AM   Modules accepted: Orders

## 2022-12-21 ENCOUNTER — Other Ambulatory Visit: Payer: Self-pay | Admitting: Cardiology

## 2022-12-21 DIAGNOSIS — E7800 Pure hypercholesterolemia, unspecified: Secondary | ICD-10-CM

## 2022-12-21 DIAGNOSIS — I5032 Chronic diastolic (congestive) heart failure: Secondary | ICD-10-CM

## 2022-12-21 DIAGNOSIS — Z955 Presence of coronary angioplasty implant and graft: Secondary | ICD-10-CM

## 2022-12-21 DIAGNOSIS — I2510 Atherosclerotic heart disease of native coronary artery without angina pectoris: Secondary | ICD-10-CM

## 2022-12-21 DIAGNOSIS — Z951 Presence of aortocoronary bypass graft: Secondary | ICD-10-CM

## 2023-01-02 NOTE — Progress Notes (Signed)
[facility] - Called and spoke with patient and daughter regarding lab results. Daughter would like to know what he can take for pain, because told he cannot take Advil. Please advise.

## 2023-01-10 NOTE — Progress Notes (Signed)
Called and spoke to patient's daughter and was given message daughter stated it has not even been a week. They were waiting for the week to be up so patient could go get labs (next week) daughter stated just today they had a f/u with PCP and was told labs look fine but that pt did indeed have to f/u with a kidney doctor sooner or later but was not urgent therefore she does not understand why patient needs to go to the ER that so far they have followed orders in holding medications and f/u with pcp I explained the concern regarding his elevated levels and how we wanted to make sure his renal function is stable but at the end of the day it was patient's decision. She did not give me an actual answer if they are going to go just that what does she say when they arrive and that she is going to call his pcp office to make them aware what is going on.

## 2023-01-14 ENCOUNTER — Other Ambulatory Visit: Payer: Self-pay | Admitting: Cardiology

## 2023-01-14 DIAGNOSIS — Z955 Presence of coronary angioplasty implant and graft: Secondary | ICD-10-CM

## 2023-01-14 DIAGNOSIS — Z8673 Personal history of transient ischemic attack (TIA), and cerebral infarction without residual deficits: Secondary | ICD-10-CM

## 2023-01-15 ENCOUNTER — Telehealth: Payer: Self-pay

## 2023-01-15 DIAGNOSIS — I5032 Chronic diastolic (congestive) heart failure: Secondary | ICD-10-CM

## 2023-01-15 NOTE — Telephone Encounter (Signed)
Patient's daughter wants to know if patient should start back on meds since his blood work is better. Patient daughter wants [place] to contact her and not her father.

## 2023-01-16 NOTE — Telephone Encounter (Signed)
David Burton, we need more details. I do not understand what meds and what labs, please clarify and forward to Dr. Odis Hollingshead

## 2023-01-17 ENCOUNTER — Telehealth: Payer: Self-pay

## 2023-01-17 NOTE — Progress Notes (Signed)
From provider

## 2023-01-17 NOTE — Telephone Encounter (Addendum)
Called to verify if patient was seeing Nephrologist and daughter Camelia Eng answered that phone. She informed that patient is no longer under the care of Richarda Blade, NP. I have removed Richarda Blade, NP as PCP. Looking in chart patient had not been to our office since October 2022.

## 2023-01-17 NOTE — Telephone Encounter (Signed)
Message left on clinical intake voicemail from [facility]. Message was regarding recent labs. They wanted to know if PCP received message from Dr.Tolia regarding patient's renal function.  If, not message stated that patient's renal function is back to baseline when compared to a month ago. Would recommend restarting lasix and farxiga. Continue to monitor renal function and if it remains stable will start low dose losartan and hold off on entresto for now. Wonder if you will follow up with patient's renal function medications.  Message sent to Richarda Blade, NP

## 2023-01-17 NOTE — Telephone Encounter (Signed)
Patient has upcoming appointment in September.Please verify if patient following up with Kidney specialist?

## 2023-01-17 NOTE — Telephone Encounter (Signed)
Noted  

## 2023-01-20 MED ORDER — TORSEMIDE 10 MG PO TABS
10.0000 mg | ORAL_TABLET | Freq: Every day | ORAL | 0 refills | Status: DC
Start: 2023-01-20 — End: 2023-05-22

## 2023-01-20 NOTE — Telephone Encounter (Signed)
    Latest Ref Rng & Units 01/13/2023   11:00 AM 12/31/2022    8:55 AM 12/03/2022    8:40 AM  BMP  Glucose 70 - 99 mg/dL 841  324  401   BUN 8 - 27 mg/dL 24  36  28   Creatinine 0.76 - 1.27 mg/dL 0.27  2.53  6.64   BUN/Creat Ratio 10 - 24 13  15  14    Sodium 134 - 144 mmol/L 141  139  140   Potassium 3.5 - 5.2 mmol/L 4.5  4.2  4.5   Chloride 96 - 106 mmol/L 103  99  104   CO2 20 - 29 mmol/L 22  24  21    Calcium 8.6 - 10.2 mg/dL 40.3  9.8  9.4     Renal function has improved after holding Comoros, Lasix, and Entresto.  Daughter states that he has reduced intake of NSAIDs for pain control as well.  Appointment with nephrology is in November 2024.  Since renal function is relatively back to baseline shared decision was to restart Farxiga 10 mg p.o. daily.  Start torsemide 10 mg p.o. daily.  And labs in 1 week.  Mohd. Derflinger [place], DO, [facility]

## 2023-01-20 NOTE — Telephone Encounter (Signed)
Patients daughter states she seen blood work results was way better, and wants to know if her father should be put back on Entresto, Lasix, and Comoros

## 2023-02-05 LAB — BASIC METABOLIC PANEL
BUN/Creatinine Ratio: 12 (ref 10–24)
BUN: 24 mg/dL (ref 8–27)
CO2: 22 mmol/L (ref 20–29)
Calcium: 9.6 mg/dL (ref 8.6–10.2)
Chloride: 104 mmol/L (ref 96–106)
Creatinine, Ser: 2.03 mg/dL — ABNORMAL HIGH (ref 0.76–1.27)
Glucose: 155 mg/dL — ABNORMAL HIGH (ref 70–99)
Potassium: 4.5 mmol/L (ref 3.5–5.2)
Sodium: 138 mmol/L (ref 134–144)
eGFR: 34 mL/min/{1.73_m2} — ABNORMAL LOW (ref >59)

## 2023-02-05 NOTE — Progress Notes (Signed)
Called and spoke to patient's daughter she voiced understanding

## 2023-02-12 ENCOUNTER — Ambulatory Visit (INDEPENDENT_AMBULATORY_CARE_PROVIDER_SITE_OTHER): Payer: 59 | Admitting: Podiatry

## 2023-02-12 DIAGNOSIS — Z91199 Patient's noncompliance with other medical treatment and regimen due to unspecified reason: Secondary | ICD-10-CM

## 2023-02-13 NOTE — Progress Notes (Signed)
1. No-show for appointment     

## 2023-02-20 NOTE — Telephone Encounter (Signed)
This encounter was created in error - please disregard.

## 2023-04-17 ENCOUNTER — Other Ambulatory Visit: Payer: Self-pay | Admitting: Cardiology

## 2023-04-17 DIAGNOSIS — Z955 Presence of coronary angioplasty implant and graft: Secondary | ICD-10-CM

## 2023-04-17 DIAGNOSIS — Z8673 Personal history of transient ischemic attack (TIA), and cerebral infarction without residual deficits: Secondary | ICD-10-CM

## 2023-05-07 ENCOUNTER — Ambulatory Visit (HOSPITAL_COMMUNITY): Admission: RE | Admit: 2023-05-07 | Payer: 59 | Source: Ambulatory Visit | Attending: Cardiology | Admitting: Cardiology

## 2023-05-07 ENCOUNTER — Other Ambulatory Visit: Payer: 59

## 2023-05-11 ENCOUNTER — Encounter: Payer: Self-pay | Admitting: Cardiology

## 2023-05-13 ENCOUNTER — Ambulatory Visit (HOSPITAL_COMMUNITY)
Admission: RE | Admit: 2023-05-13 | Discharge: 2023-05-13 | Disposition: A | Discharge status: Home / Self Care | Payer: 59 | Source: Ambulatory Visit | Attending: Cardiology | Admitting: Cardiology

## 2023-05-13 DIAGNOSIS — I6523 Occlusion and stenosis of bilateral carotid arteries: Secondary | ICD-10-CM

## 2023-05-15 ENCOUNTER — Telehealth: Payer: Self-pay | Admitting: Cardiology

## 2023-05-15 NOTE — Telephone Encounter (Signed)
Pt's daughter calling to f/u on Carotid test results. Please advise

## 2023-05-15 NOTE — Telephone Encounter (Signed)
Spoke with daughter on the phone and reviewed the carotid duplex results and Dr. Emelda Brothers recommendations. Pt had requested to be sent a current medication list from our office. One was sent via MyChart. Pt's daughter verbalized understanding of everything discussed and had no further questions at this time.

## 2023-05-19 ENCOUNTER — Other Ambulatory Visit: Payer: Self-pay | Admitting: *Deleted

## 2023-05-19 DIAGNOSIS — I6523 Occlusion and stenosis of bilateral carotid arteries: Secondary | ICD-10-CM

## 2023-05-19 DIAGNOSIS — E7800 Pure hypercholesterolemia, unspecified: Secondary | ICD-10-CM

## 2023-05-19 DIAGNOSIS — Z955 Presence of coronary angioplasty implant and graft: Secondary | ICD-10-CM

## 2023-05-19 DIAGNOSIS — I25110 Atherosclerotic heart disease of native coronary artery with unstable angina pectoris: Secondary | ICD-10-CM

## 2023-05-19 DIAGNOSIS — I2510 Atherosclerotic heart disease of native coronary artery without angina pectoris: Secondary | ICD-10-CM

## 2023-05-19 DIAGNOSIS — Z951 Presence of aortocoronary bypass graft: Secondary | ICD-10-CM

## 2023-05-20 ENCOUNTER — Ambulatory Visit: Payer: Self-pay | Admitting: Cardiology

## 2023-05-20 LAB — LIPID PANEL
Chol/HDL Ratio: 2.1 {ratio} (ref 0.0–5.0)
Cholesterol, Total: 79 mg/dL — ABNORMAL LOW (ref 100–199)
HDL: 38 mg/dL — ABNORMAL LOW (ref >39)
LDL Chol Calc (NIH): 22 mg/dL (ref 0–99)
Triglycerides: 97 mg/dL (ref 0–149)
VLDL Cholesterol Cal: 19 mg/dL (ref 5–40)

## 2023-05-22 ENCOUNTER — Ambulatory Visit: Payer: 59 | Attending: Cardiology | Admitting: Cardiology

## 2023-05-22 ENCOUNTER — Encounter: Payer: Self-pay | Admitting: Cardiology

## 2023-05-22 VITALS — BP 110/80 | HR 80 | Resp 16 | Ht 67.0 in | Wt 260.6 lb

## 2023-05-22 DIAGNOSIS — Z794 Long term (current) use of insulin: Secondary | ICD-10-CM

## 2023-05-22 DIAGNOSIS — I2510 Atherosclerotic heart disease of native coronary artery without angina pectoris: Secondary | ICD-10-CM | POA: Diagnosis not present

## 2023-05-22 DIAGNOSIS — Z951 Presence of aortocoronary bypass graft: Secondary | ICD-10-CM | POA: Diagnosis not present

## 2023-05-22 DIAGNOSIS — E1159 Type 2 diabetes mellitus with other circulatory complications: Secondary | ICD-10-CM

## 2023-05-22 DIAGNOSIS — N1832 Chronic kidney disease, stage 3b: Secondary | ICD-10-CM

## 2023-05-22 DIAGNOSIS — I6523 Occlusion and stenosis of bilateral carotid arteries: Secondary | ICD-10-CM

## 2023-05-22 DIAGNOSIS — I5032 Chronic diastolic (congestive) heart failure: Secondary | ICD-10-CM | POA: Diagnosis not present

## 2023-05-22 DIAGNOSIS — I129 Hypertensive chronic kidney disease with stage 1 through stage 4 chronic kidney disease, or unspecified chronic kidney disease: Secondary | ICD-10-CM

## 2023-05-22 DIAGNOSIS — E7800 Pure hypercholesterolemia, unspecified: Secondary | ICD-10-CM

## 2023-05-22 DIAGNOSIS — Z8673 Personal history of transient ischemic attack (TIA), and cerebral infarction without residual deficits: Secondary | ICD-10-CM

## 2023-05-22 MED ORDER — LOSARTAN POTASSIUM 25 MG PO TABS: 25.0000 mg | ORAL_TABLET | Freq: Every morning | ORAL | 3 refills | Status: AC

## 2023-05-22 MED ORDER — METOPROLOL SUCCINATE ER 50 MG PO TB24: 50.0000 mg | ORAL_TABLET | Freq: Every day | ORAL | 3 refills | Status: AC

## 2023-05-22 NOTE — Progress Notes (Signed)
Cardiology Office Note:  .   Date:  05/22/2023  ID:  David Burton, DOB 1946/02/01, MRN [id number] PCP:  Patient, No Pcp Per  Former Cardiology Providers: Dr. Yates Decamp, Altamese Ammon, APRN, FNP-C  [facility] Providers Cardiologist:  Tessa Lerner, DO , [facility] (established care June 2024) Electrophysiologist:  None  Click to update primary MD,subspecialty MD or APP then REFRESH:1}    Chief Complaint  Patient presents with   Chronic heart failure with preserved ejection fraction   Follow-up    History of Present Illness: .   David Burton is a 77 y.o. Caucasian male whose past medical history and cardiovascular risk factors includes: Chronic HFpEF, NSTEMI in Dec 2018, s/p CABG x 4 on 05/08/2017 (LIMA to LAD, SVG to PDA, SVG to Diagonal, SVG to Ramus. Surgery done at [facility], [place]), diabetes mellitus type 2 with peripheral neuropathy, hypertension, hypercholesterolemia, CVA (09/2019), advanced age.   Patient being followed by the practice given his CAD and HFpEF.  He presents today for 41-month follow-up visit.  Patient is accompanied by his daughter David Burton who provides collateral history during today's encounter and patient provided verbal consent with having her present during today's office visit.  Over the last 6 months he is doing well from a cardiovascular standpoint.  He denies anginal chest pain or heart failure symptoms.  He is walking approximately 0.5 to 1 miles 3-4 times per week.  Given his chronic kidney disease he established care with nephrology in [facility] and has an appointment in 3 months.  With lifestyle changes and also implementation of Mounjaro he is lost approximately 15 pounds over the last 6 months he is congratulated for his success through his weight loss journey.  He is no longer taking NSAIDs for pain control. We were able to restart his Marcelline Deist and he takes his Lasix on as needed basis.  He is currently not on ACE inhibitors, ARB, or  Arni.  Review of Systems: .   Review of Systems  Cardiovascular:  Negative for chest pain, claudication, irregular heartbeat, leg swelling, near-syncope, orthopnea, palpitations, paroxysmal nocturnal dyspnea and syncope.  Respiratory:  Negative for shortness of breath.   Hematologic/Lymphatic: Negative for bleeding problem.    Studies Reviewed:   Coronary artery bypass grafting: s/p CABG x 4 on 05/08/2017 (LIMA to LAD, SVG to PDA, SVG to Diagonal, SVG to Ramus.   EKG: November 11, 2022: Sinus rhythm, 78 bpm, incomplete right bundle branch block, consider old inferior infarct.  No significant change compared to prior tracing   Echocardiogram: 08/21/2021:  Normal LV systolic function with visual EF 55-60%. Left ventricle cavity is normal in size. Normal left ventricular wall thickness. Normal global wall motion. Doppler evidence of grade II (pseudonormal) diastolic dysfunction, elevated LAP.  Mild (Grade I) mitral regurgitation.  Mild tricuspid regurgitation. No evidence of pulmonary hypertension. RVSP measures 37 mmHg.  Compared to study 09/17/2019 mild MR and TR are new otherwise no significant change.    Heart Catheterization: Coronary angiogram 01/05/2019: Normal LVEDP, severe native vessel disease.  High-grade proximal RCA 90 to 95% stenosis followed by 70 to 80% stenosis in the in-stent restenotic proximal segment.  Large RCA.  SVG to RCA could not be visualized in spite of multiple catheter attempts, and presumed occluded. Left main is large and mildly diseased but patent.  LAD is occluded in the midsegment after the origin of D1, which has ostial 90% stenosis.  SVG to D1 is patent.  LIMA to LAD is patent.  Ramus intermediate: Large vessel, mild to moderate calcific 80 to 85% stenosis in the proximal segment.  SVG to ramus intermediate is occluded. Circumflex: High-grade 99%/subtotal occlusion in the ostium.  Moderate to large sized vessel.  Small marginals.   Coronary intervention  01/26/2019: Successful PTCA and stenting of the proximal mid and mid to distal RCA with implantation of 2 overlapping 3.0 x 38 mm Promus Synergy DES, stenosis reduced from 90% to 0% with TIMI-3 to TIMI-3 flow.     Recommendation: With medical high-grade ramus intermediate and circumflex stenosis, he has excellent brisk flow there and ischemia was in the inferior wall, however if he still persist to have recurrence of angina pectoris we will schedule him for complex intervention to the left coronary system.  In view of his underlying comorbidity, would recommend medical therapy   Carotid duplex: 09/17/2019: Right Carotid: Velocities in the right ICA are consistent with a 1-39% stenosis.  Left Carotid: Velocities in the left ICA are consistent with a 1-39% stenosis.  Vertebrals: Left vertebral artery demonstrates antegrade flow. Right vertebral artery was not visualized  Carotid duplex December 2024: Right ICA 1-39% stenosis. Left ICA 40-59% stenosis. Bilateral vertebral artery antegrade flow.    14 - day [facility] Cardiac Ambulatory Telemetry.  Dominant rhythm normal sinus. Heart rate 59-156 bpm.  Avg HR 85 bpm. No atrial fibrillation, ventricular tachycardia, high grade AV block, pauses (3 seconds or longer). Total ventricular and supraventricular ectopic burden <1%. Two episode of supraventricular tachycardia. Longest and fastest episode was 17 beats at maximum HR of 156 bpm. Patient triggered events: 0.  RADIOLOGY: MR Brain w/o contrast:  IMPRESSION: 1. Mildly motion degraded exam. 2. Confirmed 17 mm acute/early subacute infarct within the right pons. 3. Background mild chronic small vessel ischemic disease within the cerebral white matter and pons. Tiny chronic lacunar infarct within the left cerebellum. 4. Mild generalized parenchymal atrophy. 5. Mild paranasal sinus mucosal thickening. Trace left mastoid effusion.  Risk Assessment/Calculations:   N/A   Labs:       Latest Ref  Rng & Units 01/13/2023   11:11 AM 12/13/2020    1:28 PM 09/15/2020    1:47 AM  CBC  WBC 3.4 - 10.8 x10E3/uL 8.3  7.8  10.5   Hemoglobin 13.0 - 17.7 g/dL 40.9  81.1  91.4   Hematocrit 37.5 - 51.0 % 43.5  41.3  35.0   Platelets 150 - 450 x10E3/uL 156  141  178        Latest Ref Rng & Units 02/04/2023   10:48 AM 01/13/2023   11:00 AM 12/31/2022    8:55 AM  BMP  Glucose 70 - 99 mg/dL 782  956  213   BUN 8 - 27 mg/dL 24  24  36   Creatinine 0.76 - 1.27 mg/dL 0.86  5.78  4.69   BUN/Creat Ratio 10 - 24 12  13  15    Sodium 134 - 144 mmol/L 138  141  139   Potassium 3.5 - 5.2 mmol/L 4.5  4.5  4.2   Chloride 96 - 106 mmol/L 104  103  99   CO2 20 - 29 mmol/L 22  22  24    Calcium 8.6 - 10.2 mg/dL 9.6  62.9  9.8       Latest Ref Rng & Units 02/04/2023   10:48 AM 01/13/2023   11:00 AM 12/31/2022    8:55 AM  CMP  Glucose 70 - 99 mg/dL 528  413  244  BUN 8 - 27 mg/dL 24  24  36   Creatinine 0.76 - 1.27 mg/dL 0.98  1.19  1.47   Sodium 134 - 144 mmol/L 138  141  139   Potassium 3.5 - 5.2 mmol/L 4.5  4.5  4.2   Chloride 96 - 106 mmol/L 104  103  99   CO2 20 - 29 mmol/L 22  22  24    Calcium 8.6 - 10.2 mg/dL 9.6  82.9  9.8   Total Protein 6.0 - 8.5 g/dL   7.2   Total Bilirubin 0.0 - 1.2 mg/dL   0.5   Alkaline Phos 44 - 121 IU/L   75   AST 0 - 40 IU/L   17   ALT 0 - 44 IU/L   18     Lab Results  Component Value Date   CHOL 79 (L) 05/19/2023   HDL 38 (L) 05/19/2023   LDLCALC 22 05/19/2023   LDLDIRECT 50 12/31/2022   TRIG 97 05/19/2023   CHOLHDL 2.1 05/19/2023   No results for input(s): "LIPOA" in the last 8760 hours. No components found for: "NTPROBNP" Recent Labs    12/03/22 0840 01/13/23 1100  PROBNP 832* 438   No results for input(s): "TSH" in the last 8760 hours.   Physical Exam:    Today's Vitals   05/22/23 1446  BP: 110/80  Pulse: 80  Resp: 16  SpO2: 95%  Weight: 260 lb 9.6 oz (118.2 kg)  Height: 5\' 7"  (1.702 m)   Body mass index is 40.82 kg/m. Wt Readings from  Last 3 Encounters:  05/22/23 260 lb 9.6 oz (118.2 kg)  11/11/22 275 lb (124.7 kg)  11/07/22 267 lb 6.7 oz (121.3 kg)    Physical Exam  Constitutional: No distress. He appears chronically ill.  Ambulates with a walker, hemodynamically stable.   Neck: No JVD present.  Cardiovascular: Normal rate, regular rhythm, S1 normal, S2 normal, intact distal pulses and normal pulses. Exam reveals no gallop, no S3 and no S4.  No murmur heard. Pulmonary/Chest: Effort normal and breath sounds normal. No stridor. He has no wheezes. He has no rales.  Abdominal: Soft. Bowel sounds are normal. He exhibits no distension. There is no abdominal tenderness.  Obesity  Musculoskeletal:        General: No edema.     Cervical back: Neck supple.  Neurological: He is alert and oriented to person, place, and time. He has intact cranial nerves (2-12).  Skin: Skin is warm and moist.   Impression & Recommendation(s):  Impression:   ICD-10-CM   1. Coronary artery disease involving native coronary artery of native heart without angina pectoris  I25.10 metoprolol succinate (TOPROL-XL) 50 MG 24 hr tablet    losartan (COZAAR) 25 MG tablet    Basic metabolic panel    Basic metabolic panel    2. S/P CABG x 4  Z95.1     3. Atherosclerosis of both carotid arteries  I65.23 VAS US CAROTID    4. Chronic heart failure with preserved ejection fraction (HFpEF) (HCC)  I50.32     5. Type 2 diabetes mellitus with other circulatory complication, with long-term current use of insulin (HCC)  E11.59    Z79.4     6. Pure hypercholesterolemia  E78.00     7. History of stroke  Z86.73     8. Hypertensive kidney disease with stage 3b chronic kidney disease (HCC)  I12.9    N18.32        Recommendation(s):  Coronary artery disease involving native coronary artery of native heart without angina pectoris S/P CABG x 4 Chronic heart failure with preserved ejection fraction (HFpEF) (HCC) Denies anginal chest pain or heart failure  symptoms. Stage C, NYHA class II. Medications reconciled. Since last office visit we had to discontinue his Clifton Custard, and torsemide in July 2024 due to acute kidney injury.  We were successfully able to restart Comoros on a regular basis and he uses Lasix on as needed basis.  He is currently not on ACE inhibitors, ARB, or Arni at this time.  Most recent labs from December 2024 independently reviewed.  Given his comorbidities patient would still benefit from low-dose ARB.  Will start losartan 25 mg p.o. daily.  Repeat labs in 1 week to evaluate kidney function. Discontinue Lopressor 25 mg p.o. twice daily. Transition to Toprol-XL 50 mg p.o. daily  Atherosclerosis of both carotid arteries Left ICA disease progressed compared to prior study. Continue antiplatelet therapy and lipid-lowering agents. Follow-up duplex in 1 year to evaluate disease progression. Patient is aware of strokelike symptoms and if present should go to closest ER via EMS.  Type 2 diabetes mellitus with other circulatory complication, with long-term current use of insulin (HCC) Reemphasized importance of glycemic control. He has been lost approximately 15 pounds in the last office visit due to lifestyle changes and implementation of Mounjaro. Most recent lipid profile from December 2024 independently reviewed, LDL and triglycerides are at goal  Pure hypercholesterolemia Currently on Repatha, Zetia 10 mg p.o. daily, Tricor 145 mg p.o. daily.   He denies myalgia or other side effects. Most recent lipids dated May 19, 2023, independently reviewed as noted above.  History of stroke Reemphasized the importance of secondary prevention with focus on improving her modifiable cardiovascular risk factors such as glycemic control, lipid management, blood pressure control, weight loss.  Hypertensive kidney disease with stage 3b chronic kidney disease (HCC) Office blood pressures are very well-controlled. Will restart  losartan as discussed above. Follow-up labs in 1 week. Patient is advised to continue care with his nephrologist given his chronic kidney disease   Orders Placed:  Orders Placed This Encounter  Procedures   Basic metabolic panel    Standing Status:   Future    Number of Occurrences:   1    Expected Date:   05/29/2023    Expiration Date:   05/21/2024    Discussed management of at least 2 chronic comorbid conditions, reviewed labs from 05/19/2023 independently, medications reconciled, changes in pharmacological therapy as discussed above, plan of care and HPI obtained by his daughter at bedside, coordination of care, and ordering additional diagnostic workup.  Final Medication List:    Meds ordered this encounter  Medications   metoprolol succinate (TOPROL-XL) 50 MG 24 hr tablet    Sig: Take 1 tablet (50 mg total) by mouth daily. Take with or immediately following a meal.    Dispense:  90 tablet    Refill:  3    Switching from Lopressor to Toprol-XL   losartan (COZAAR) 25 MG tablet    Sig: Take 1 tablet (25 mg total) by mouth in the morning.    Dispense:  90 tablet    Refill:  3    Medications Discontinued During This Encounter  Medication Reason   tiZANidine (ZANAFLEX) 4 MG tablet Patient Preference   TRULICITY 0.75 MG/0.5ML SOPN Change in therapy   torsemide (DEMADEX) 10 MG tablet Change in therapy   insulin lispro (HUMALOG KWIKPEN) 100 UNIT/ML  KwikPen Patient Preference   ketoconazole (NIZORAL) 2 % cream Patient Preference   gabapentin (NEURONTIN) 100 MG capsule Change in therapy   hydrocortisone (ANUSOL-HC) 2.5 % rectal cream Patient Preference   ibuprofen (ADVIL) 200 MG tablet Patient Preference   insulin detemir (LEVEMIR) 100 UNIT/ML FlexPen Patient Preference   metoprolol tartrate (LOPRESSOR) 25 MG tablet Discontinued by provider     Current Outpatient Medications:    aspirin 81 MG chewable tablet, Chew 1 tablet by mouth daily., Disp: , Rfl:    atorvastatin  (LIPITOR) 20 MG tablet, TAKE 1 TABLET(20 MG) BY MOUTH DAILY (Patient taking differently: Take 20 mg by mouth daily.), Disp: 90 tablet, Rfl: 1   clopidogrel (PLAVIX) 75 MG tablet, TAKE 1 TABLET(75 MG) BY MOUTH DAILY, Disp: 90 tablet, Rfl: 2   Continuous Blood Gluc Sensor (DEXCOM G6 SENSOR) MISC, Use to test blood sugar once daily. Dx: E11.42, Disp: 9 each, Rfl: 3   Continuous Blood Gluc Transmit (DEXCOM G6 TRANSMITTER) MISC, Use to test blood sugar once daily. Dx: E11.42, Disp: 1 each, Rfl: 0   ezetimibe (ZETIA) 10 MG tablet, TAKE 1 TABLET(10 MG) BY MOUTH DAILY, Disp: 90 tablet, Rfl: 3   FARXIGA 10 MG TABS tablet, TAKE 1 TABLET BY MOUTH DAILY BEFORE BREAKFAST, Disp: 90 tablet, Rfl: 0   fenofibrate (TRICOR) 145 MG tablet, Take 1 tablet (145 mg total) by mouth daily., Disp: 30 tablet, Rfl: 3   furosemide (LASIX) 40 MG tablet, Take 40 mg by mouth every morning., Disp: , Rfl:    glucose blood (ACCU-CHEK AVIVA PLUS) test strip, Use to test blood sugar 3 times daily. Dx: E11.42, Disp: 300 each, Rfl: 1   insulin aspart (NOVOLOG FLEXPEN) 100 UNIT/ML FlexPen, Use depending on blood sugar: <150 take 0 units, 150-200 take 2 units, 200-250 take 4 units, 250-300 take 6 units, 300-350 take 8 units, >350 take 10 units, Disp: 30 mL, Rfl: 3   Insulin Pen Needle (BD PEN NEEDLE NANO 2ND GEN) 32G X 4 MM MISC, USE 1 PEN THREE TIMES DAILY AS DIRECTED, Disp: 100 each, Rfl: 3   isosorbide mononitrate (IMDUR) 60 MG 24 hr tablet, TAKE 1 TABLET(60 MG) BY MOUTH DAILY, Disp: 90 tablet, Rfl: 1   losartan (COZAAR) 25 MG tablet, Take 1 tablet (25 mg total) by mouth in the morning., Disp: 90 tablet, Rfl: 3   metoprolol succinate (TOPROL-XL) 50 MG 24 hr tablet, Take 1 tablet (50 mg total) by mouth daily. Take with or immediately following a meal., Disp: 90 tablet, Rfl: 3   MOUNJARO 15 MG/0.5ML Pen, Inject 15 mg into the skin once a week., Disp: , Rfl:    nitroGLYCERIN (NITROSTAT) 0.4 MG SL tablet, Dissolve one tablet under tongue as  needed for chest pain., Disp: 30 tablet, Rfl: 3   NOVOLOG FLEXPEN 100 UNIT/ML FlexPen, , Disp: , Rfl:    oxyCODONE (ROXICODONE) 15 MG immediate release tablet, Take 15 mg by mouth every 6 (six) hours., Disp: , Rfl:    pregabalin (LYRICA) 100 MG capsule, Take 100 mg by mouth daily., Disp: , Rfl:    REPATHA SURECLICK 140 MG/ML SOAJ, INJECT 140 MG INTO SKIN EVERY 14 DAYS, Disp: 6 mL, Rfl: 3   tamsulosin (FLOMAX) 0.4 MG CAPS capsule, Take 1 capsule by mouth daily., Disp: , Rfl:    TRESIBA FLEXTOUCH 100 UNIT/ML FlexTouch Pen, Inject into the skin., Disp: , Rfl:   Consent:   N/A  Disposition:   32-month follow-up.  Patient may be asked to follow-up sooner  based on the results of the above-mentioned testing.  His questions and concerns were addressed to his satisfaction. He voices understanding of the recommendations provided during this encounter.    Signed, Tessa Lerner, DO, [facility]  [facility]  [address] [place], [place] [address] 05/22/2023 3:25 PM

## 2023-05-22 NOTE — Patient Instructions (Addendum)
Medication Instructions:  Your physician has recommended you make the following change in your medication:   STOP Metoprolol Tartrate (lopressor)   START Metoprolol Succinate (Toprol-XL) 50 mg once daily  START Losartan 25 mg once daily in the morning    *If you need a refill on your cardiac medications before your next appointment, please call your pharmacy*  Lab Work: None ordered today. If you have labs (blood work) drawn today and your tests are completely normal, you will receive your results only by: MyChart Message (if you have MyChart) OR A paper copy in the mail If you have any lab test that is abnormal or we need to change your treatment, we will call you to review the results.  Testing/Procedures: Your physician has requested that you have a carotid duplex for December 2025. This test is an ultrasound of the carotid arteries in your neck. It looks at blood flow through these arteries that supply the brain with blood. Allow one hour for this exam. There are no restrictions or special instructions.   Follow-Up: At [facility], you and your health needs are our priority.  As part of our continuing mission to provide you with exceptional heart care, we have created designated Provider Care Teams.  These Care Teams include your primary Cardiologist (physician) and Advanced Practice Providers (APPs -  Physician Assistants and Nurse Practitioners) who all work together to provide you with the care you need, when you need it.  Your next appointment:   6 month(s)  The format for your next appointment:   In Person  Provider:   Tessa Lerner, DO {

## 2023-06-02 ENCOUNTER — Ambulatory Visit: Payer: 59 | Admitting: Cardiology

## 2023-06-02 ENCOUNTER — Other Ambulatory Visit: Payer: Self-pay

## 2023-06-02 ENCOUNTER — Encounter: Payer: Self-pay | Admitting: Cardiology

## 2023-06-02 DIAGNOSIS — I2510 Atherosclerotic heart disease of native coronary artery without angina pectoris: Secondary | ICD-10-CM

## 2023-06-02 MED ORDER — NITROGLYCERIN 0.4 MG SL SUBL: SUBLINGUAL_TABLET | SUBLINGUAL | 0 refills | Status: AC

## 2023-06-02 NOTE — Progress Notes (Signed)
Refill for nitroglycerin tablets ordered d/t current rx expiring. Pt is aware.

## 2023-06-12 LAB — BASIC METABOLIC PANEL
BUN/Creatinine Ratio: 15 (ref 10–24)
BUN: 32 mg/dL — ABNORMAL HIGH (ref 8–27)
CO2: 22 mmol/L (ref 20–29)
Calcium: 10 mg/dL (ref 8.6–10.2)
Chloride: 101 mmol/L (ref 96–106)
Creatinine, Ser: 2.2 mg/dL — ABNORMAL HIGH (ref 0.76–1.27)
Glucose: 108 mg/dL — ABNORMAL HIGH (ref 70–99)
Potassium: 4.2 mmol/L (ref 3.5–5.2)
Sodium: 139 mmol/L (ref 134–144)
eGFR: 30 mL/min/{1.73_m2} — ABNORMAL LOW (ref >59)

## 2023-06-14 ENCOUNTER — Other Ambulatory Visit: Payer: Self-pay | Admitting: Cardiology

## 2023-06-16 ENCOUNTER — Encounter: Payer: Self-pay | Admitting: Cardiology

## 2023-07-15 ENCOUNTER — Other Ambulatory Visit (HOSPITAL_COMMUNITY): Payer: Self-pay

## 2023-07-15 MED ORDER — OXYCODONE HCL 15 MG PO TABS
15.0000 mg | ORAL_TABLET | Freq: Four times a day (QID) | ORAL | 0 refills | Status: DC | PRN
  Filled 2023-07-15: qty 120, 30d supply, fill #0

## 2023-08-12 ENCOUNTER — Other Ambulatory Visit (HOSPITAL_COMMUNITY): Payer: Self-pay

## 2023-08-12 MED ORDER — OXYCODONE HCL 15 MG PO TABS
15.0000 mg | ORAL_TABLET | Freq: Four times a day (QID) | ORAL | 0 refills | Status: DC
  Filled 2023-08-12: qty 120, 30d supply, fill #0

## 2023-08-19 ENCOUNTER — Other Ambulatory Visit (HOSPITAL_COMMUNITY): Payer: Self-pay

## 2023-09-09 ENCOUNTER — Other Ambulatory Visit (HOSPITAL_COMMUNITY): Payer: Self-pay

## 2023-09-09 MED ORDER — OXYCODONE HCL 15 MG PO TABS
15.0000 mg | ORAL_TABLET | Freq: Four times a day (QID) | ORAL | 0 refills | Status: DC | PRN
  Filled 2023-09-10: qty 120, 30d supply, fill #0

## 2023-09-09 MED ORDER — NALOXONE HCL 4 MG/0.1ML NA LIQD
1.0000 | NASAL | 0 refills | Status: AC
  Filled 2023-09-09: qty 4, 4d supply, fill #0

## 2023-09-09 MED ORDER — ZTLIDO 1.8 % EX PTCH
3.0000 | MEDICATED_PATCH | Freq: Two times a day (BID) | CUTANEOUS | 0 refills | Status: DC
  Filled 2023-09-09: qty 90, 30d supply, fill #0

## 2023-09-09 MED ORDER — TIZANIDINE HCL 4 MG PO TABS
4.0000 mg | ORAL_TABLET | Freq: Three times a day (TID) | ORAL | 0 refills | Status: DC
  Filled 2023-09-09: qty 90, 30d supply, fill #0

## 2023-09-10 ENCOUNTER — Other Ambulatory Visit (HOSPITAL_COMMUNITY): Payer: Self-pay

## 2023-09-10 ENCOUNTER — Other Ambulatory Visit: Payer: Self-pay

## 2023-10-07 ENCOUNTER — Other Ambulatory Visit (HOSPITAL_COMMUNITY): Payer: Self-pay

## 2023-10-07 MED ORDER — ZTLIDO 1.8 % EX PTCH
3.0000 | MEDICATED_PATCH | Freq: Every day | CUTANEOUS | 0 refills | Status: DC
  Filled 2023-10-07: qty 90, 30d supply, fill #0

## 2023-10-07 MED ORDER — OXYCODONE HCL 15 MG PO TABS
15.0000 mg | ORAL_TABLET | Freq: Four times a day (QID) | ORAL | 0 refills | Status: DC
  Filled 2023-10-08: qty 120, 30d supply, fill #0

## 2023-10-07 MED ORDER — TIZANIDINE HCL 4 MG PO TABS
4.0000 mg | ORAL_TABLET | Freq: Three times a day (TID) | ORAL | 0 refills | Status: DC
  Filled 2023-10-07: qty 90, 30d supply, fill #0

## 2023-10-08 ENCOUNTER — Other Ambulatory Visit: Payer: Self-pay

## 2023-10-08 ENCOUNTER — Other Ambulatory Visit (HOSPITAL_COMMUNITY): Payer: Self-pay

## 2023-11-04 ENCOUNTER — Other Ambulatory Visit (HOSPITAL_COMMUNITY): Payer: Self-pay

## 2023-11-04 MED ORDER — OXYCODONE HCL 15 MG PO TABS
15.0000 mg | ORAL_TABLET | Freq: Four times a day (QID) | ORAL | 0 refills | Status: DC
  Filled 2023-11-05: qty 120, 30d supply, fill #0

## 2023-11-04 MED ORDER — TIZANIDINE HCL 4 MG PO TABS
4.0000 mg | ORAL_TABLET | Freq: Three times a day (TID) | ORAL | 0 refills | Status: DC
  Filled 2023-11-04: qty 90, 30d supply, fill #0

## 2023-11-04 MED ORDER — ZTLIDO 1.8 % EX PTCH
3.0000 | MEDICATED_PATCH | Freq: Every day | CUTANEOUS | 0 refills | Status: DC
  Filled 2023-11-04: qty 90, 30d supply, fill #0

## 2023-11-05 ENCOUNTER — Other Ambulatory Visit (HOSPITAL_COMMUNITY): Payer: Self-pay

## 2023-11-05 ENCOUNTER — Other Ambulatory Visit: Payer: Self-pay

## 2023-11-19 ENCOUNTER — Other Ambulatory Visit: Payer: Self-pay | Admitting: Cardiology

## 2023-11-19 DIAGNOSIS — Z8673 Personal history of transient ischemic attack (TIA), and cerebral infarction without residual deficits: Secondary | ICD-10-CM

## 2023-11-19 DIAGNOSIS — Z951 Presence of aortocoronary bypass graft: Secondary | ICD-10-CM

## 2023-11-19 DIAGNOSIS — E7800 Pure hypercholesterolemia, unspecified: Secondary | ICD-10-CM

## 2023-11-19 DIAGNOSIS — I2510 Atherosclerotic heart disease of native coronary artery without angina pectoris: Secondary | ICD-10-CM

## 2023-11-24 ENCOUNTER — Ambulatory Visit: Payer: 59 | Attending: Cardiology | Admitting: Cardiology

## 2023-11-24 NOTE — Progress Notes (Deleted)
 Cardiology Office Note:  .   Date:  11/24/2023  ID:  Deward Burton, DOB 10/11/1945, MRN [id number] PCP:  Patient, No Pcp Per  Former Cardiology Providers: Dr. Gordy Bergamo, Emmalene Lawrence, APRN, FNP-C  [facility] Providers Cardiologist:  Madonna Large, DO , [facility] (established care June 2024) Electrophysiologist:  None  Click to update primary MD,subspecialty MD or APP then REFRESH:1}    No chief complaint on file.   History of Present Illness: .   David Burton is a 78 y.o. Caucasian male whose past medical history and cardiovascular risk factors includes: Chronic HFpEF, NSTEMI in Dec 2018, s/p CABG x 4 on 05/08/2017 (LIMA to LAD, SVG to PDA, SVG to Diagonal, SVG to Ramus. Surgery done at [facility], [place]), diabetes mellitus type 2 with peripheral neuropathy, hypertension, hypercholesterolemia, CVA (09/2019), advanced age.   Patient being followed by the practice given his CAD and HFpEF.  ***  He presents today for 77-month follow-up visit.  Patient is accompanied by his daughter David Burton who provides collateral history during today's encounter and patient provided verbal consent with having her present during today's office visit.  Over the last 6 months he is doing well from a cardiovascular standpoint.  He denies anginal chest pain or heart failure symptoms.  He is walking approximately 0.5 to 1 miles 3-4 times per week.  Given his chronic kidney disease he established care with nephrology in [facility] and has an appointment in 3 months.  With lifestyle changes and also implementation of Mounjaro he is lost approximately 15 pounds over the last 6 months he is congratulated for his success through his weight loss journey.  He is no longer taking NSAIDs for pain control. We were able to restart his Farxiga  and he takes his Lasix on as needed basis.  He is currently not on ACE inhibitors, ARB, or Arni.    Review of Systems: .   Review of Systems  Cardiovascular:  Negative for  chest pain, claudication, irregular heartbeat, leg swelling, near-syncope, orthopnea, palpitations, paroxysmal nocturnal dyspnea and syncope.  Respiratory:  Negative for shortness of breath.   Hematologic/Lymphatic: Negative for bleeding problem.    Studies Reviewed:   Coronary artery bypass grafting: s/p CABG x 4 on 05/08/2017 (LIMA to LAD, SVG to PDA, SVG to Diagonal, SVG to Ramus.   EKG: November 11, 2022: Sinus rhythm, 78 bpm, incomplete right bundle branch block, consider old inferior infarct.  No significant change compared to prior tracing   Echocardiogram: 08/21/2021:  Normal LV systolic function with visual EF 55-60%. Left ventricle cavity is normal in size. Normal left ventricular wall thickness. Normal global wall motion. Doppler evidence of grade II (pseudonormal) diastolic dysfunction, elevated LAP.  Mild (Grade I) mitral regurgitation.  Mild tricuspid regurgitation. No evidence of pulmonary hypertension. RVSP measures 37 mmHg.  Compared to study 09/17/2019 mild MR and TR are new otherwise no significant change.    Heart Catheterization: Coronary angiogram 01/05/2019: Normal LVEDP, severe native vessel disease.  High-grade proximal RCA 90 to 95% stenosis followed by 70 to 80% stenosis in the in-stent restenotic proximal segment.  Large RCA.  SVG to RCA could not be visualized in spite of multiple catheter attempts, and presumed occluded. Left main is large and mildly diseased but patent.  LAD is occluded in the midsegment after the origin of D1, which has ostial 90% stenosis.  SVG to D1 is patent.  LIMA to LAD is patent. Ramus intermediate: Large vessel, mild to moderate calcific 80 to  85% stenosis in the proximal segment.  SVG to ramus intermediate is occluded. Circumflex: High-grade 99%/subtotal occlusion in the ostium.  Moderate to large sized vessel.  Small marginals.   Coronary intervention 01/26/2019: Successful PTCA and stenting of the proximal mid and mid to distal RCA with  implantation of 2 overlapping 3.0 x 38 mm Promus Synergy DES, stenosis reduced from 90% to 0% with TIMI-3 to TIMI-3 flow.     Recommendation: With medical high-grade ramus intermediate and circumflex stenosis, he has excellent brisk flow there and ischemia was in the inferior wall, however if he still persist to have recurrence of angina pectoris we will schedule him for complex intervention to the left coronary system.  In view of his underlying comorbidity, would recommend medical therapy   Carotid duplex: 09/17/2019: Right Carotid: Velocities in the right ICA are consistent with a 1-39% stenosis.  Left Carotid: Velocities in the left ICA are consistent with a 1-39% stenosis.  Vertebrals: Left vertebral artery demonstrates antegrade flow. Right vertebral artery was not visualized  Carotid duplex December 2024: Right ICA 1-39% stenosis. Left ICA 40-59% stenosis. Bilateral vertebral artery antegrade flow.    14 - day [facility] Ambulatory Telemetry.  Dominant rhythm normal sinus. Heart rate 59-156 bpm.  Avg HR 85 bpm. No atrial fibrillation, ventricular tachycardia, high grade AV block, pauses (3 seconds or longer). Total ventricular and supraventricular ectopic burden <1%. Two episode of supraventricular tachycardia. Longest and fastest episode was 17 beats at maximum HR of 156 bpm. Patient triggered events: 0.  RADIOLOGY: MR Brain w/o contrast:  IMPRESSION: 1. Mildly motion degraded exam. 2. Confirmed 17 mm acute/early subacute infarct within the right pons. 3. Background mild chronic small vessel ischemic disease within the cerebral white matter and pons. Tiny chronic lacunar infarct within the left cerebellum. 4. Mild generalized parenchymal atrophy. 5. Mild paranasal sinus mucosal thickening. Trace left mastoid effusion.  Risk Assessment/Calculations:   N/A   Labs:       Latest Ref Rng & Units 01/13/2023   11:11 AM 12/13/2020    1:28 PM 09/15/2020    1:47 AM  CBC   WBC 3.4 - 10.8 x10E3/uL 8.3  7.8  10.5   Hemoglobin 13.0 - 17.7 g/dL 85.4  86.3  87.5   Hematocrit 37.5 - 51.0 % 43.5  41.3  35.0   Platelets 150 - 450 x10E3/uL 156  141  178        Latest Ref Rng & Units 06/11/2023   12:55 PM 02/04/2023   10:48 AM 01/13/2023   11:00 AM  BMP  Glucose 70 - 99 mg/dL 891  844  846   BUN 8 - 27 mg/dL 32  24  24   Creatinine 0.76 - 1.27 mg/dL 7.79  7.96  8.07   BUN/Creat Ratio 10 - 24 15  12  13    Sodium 134 - 144 mmol/L 139  138  141   Potassium 3.5 - 5.2 mmol/L 4.2  4.5  4.5   Chloride 96 - 106 mmol/L 101  104  103   CO2 20 - 29 mmol/L 22  22  22    Calcium  8.6 - 10.2 mg/dL 89.9  9.6  89.8       Latest Ref Rng & Units 06/11/2023   12:55 PM 02/04/2023   10:48 AM 01/13/2023   11:00 AM  CMP  Glucose 70 - 99 mg/dL 891  844  846   BUN 8 - 27 mg/dL 32  24  24  Creatinine 0.76 - 1.27 mg/dL 7.79  7.96  8.07   Sodium 134 - 144 mmol/L 139  138  141   Potassium 3.5 - 5.2 mmol/L 4.2  4.5  4.5   Chloride 96 - 106 mmol/L 101  104  103   CO2 20 - 29 mmol/L 22  22  22    Calcium  8.6 - 10.2 mg/dL 89.9  9.6  89.8     Lab Results  Component Value Date   CHOL 79 (L) 05/19/2023   HDL 38 (L) 05/19/2023   LDLCALC 22 05/19/2023   LDLDIRECT 50 12/31/2022   TRIG 97 05/19/2023   CHOLHDL 2.1 05/19/2023   No results for input(s): LIPOA in the last 8760 hours. No components found for: NTPROBNP Recent Labs    12/03/22 0840 01/13/23 1100  PROBNP 832* 438   No results for input(s): TSH in the last 8760 hours.   Physical Exam:    There were no vitals filed for this visit.  There is no height or weight on file to calculate BMI. Wt Readings from Last 3 Encounters:  05/22/23 260 lb 9.6 oz (118.2 kg)  11/11/22 275 lb (124.7 kg)  11/07/22 267 lb 6.7 oz (121.3 kg)    Physical Exam  Constitutional: No distress. He appears chronically ill.  Ambulates with a walker, hemodynamically stable.   Neck: No JVD present.  Cardiovascular: Normal rate, regular rhythm,  S1 normal, S2 normal, intact distal pulses and normal pulses. Exam reveals no gallop, no S3 and no S4.  No murmur heard. Pulmonary/Chest: Effort normal and breath sounds normal. No stridor. He has no wheezes. He has no rales.  Abdominal: Soft. Bowel sounds are normal. He exhibits no distension. There is no abdominal tenderness.  Obesity  Musculoskeletal:        General: No edema.     Cervical back: Neck supple.  Neurological: He is alert and oriented to person, place, and time. He has intact cranial nerves (2-12).  Skin: Skin is warm and moist.   Impression & Recommendation(s):  Impression: No diagnosis found.    Recommendation(s):  Coronary artery disease involving native coronary artery of native heart without angina pectoris S/P CABG x 4 Chronic heart failure with preserved ejection fraction (HFpEF) (HCC) *** Denies anginal chest pain or heart failure symptoms. Stage C, NYHA class II. Medications reconciled. Since last office visit we had to discontinue his Farxiga , Entresto , and torsemide  in July 2024 due to acute kidney injury.  We were successfully able to restart Farxiga  on a regular basis and he uses Lasix on as needed basis.  He is currently not on ACE inhibitors, ARB, or Arni at this time.  Most recent labs from December 2024 independently reviewed.  Given his comorbidities patient would still benefit from low-dose ARB.  Will start losartan  25 mg p.o. daily.  Repeat labs in 1 week to evaluate kidney function. Discontinue Lopressor  25 mg p.o. twice daily. Transition to Toprol -XL 50 mg p.o. daily  Atherosclerosis of both carotid arteries *** Repeat carotid duplex scheduled for May 17, 2024  Left ICA disease progressed compared to prior study. Continue antiplatelet therapy and lipid-lowering agents. Follow-up duplex in 1 year to evaluate disease progression. Patient is aware of strokelike symptoms and if present should go to closest ER via EMS.  Type 2 diabetes  mellitus with other circulatory complication, with long-term current use of insulin  (HCC) Reemphasized importance of glycemic control. He has been lost approximately 15 pounds in the last office visit due to  lifestyle changes and implementation of Mounjaro. Most recent lipid profile from December 2024 independently reviewed, LDL and triglycerides are at goal  Pure hypercholesterolemia *** Currently on Repatha , Zetia  10 mg p.o. daily, Tricor  145 mg p.o. daily.   He denies myalgia or other side effects. Most recent lipids dated May 19, 2023, independently reviewed as noted above.  History of stroke *** Reemphasized the importance of secondary prevention with focus on improving her modifiable cardiovascular risk factors such as glycemic control, lipid management, blood pressure control, weight loss.  Hypertensive kidney disease with stage 3b chronic kidney disease (HCC) *** Office blood pressures are very well-controlled. Will restart losartan  as discussed above. Follow-up labs in 1 week. Patient is advised to continue care with his nephrologist given his chronic kidney disease   Orders Placed:  No orders of the defined types were placed in this encounter.  ***  Final Medication List:    No orders of the defined types were placed in this encounter.   There are no discontinued medications.    Current Outpatient Medications:    aspirin  81 MG chewable tablet, Chew 1 tablet by mouth daily., Disp: , Rfl:    atorvastatin  (LIPITOR) 20 MG tablet, TAKE 1 TABLET(20 MG) BY MOUTH DAILY (Patient taking differently: Take 20 mg by mouth daily.), Disp: 90 tablet, Rfl: 1   clopidogrel  (PLAVIX ) 75 MG tablet, TAKE 1 TABLET(75 MG) BY MOUTH DAILY, Disp: 90 tablet, Rfl: 2   Continuous Blood Gluc Sensor (DEXCOM G6 SENSOR) MISC, Use to test blood sugar once daily. Dx: E11.42, Disp: 9 each, Rfl: 3   Continuous Blood Gluc Transmit (DEXCOM G6 TRANSMITTER) MISC, Use to test blood sugar once daily. Dx:  E11.42, Disp: 1 each, Rfl: 0   ezetimibe  (ZETIA ) 10 MG tablet, TAKE 1 TABLET(10 MG) BY MOUTH DAILY, Disp: 90 tablet, Rfl: 3   FARXIGA  10 MG TABS tablet, TAKE 1 TABLET BY MOUTH DAILY BEFORE BREAKFAST, Disp: 90 tablet, Rfl: 0   fenofibrate  (TRICOR ) 145 MG tablet, TAKE 1 TABLET BY MOUTH EVERY DAY, Disp: 30 tablet, Rfl: 11   furosemide (LASIX) 40 MG tablet, Take 40 mg by mouth every morning., Disp: , Rfl:    glucose blood (ACCU-CHEK AVIVA PLUS) test strip, Use to test blood sugar 3 times daily. Dx: E11.42, Disp: 300 each, Rfl: 1   insulin  aspart (NOVOLOG  FLEXPEN) 100 UNIT/ML FlexPen, Use depending on blood sugar: <150 take 0 units, 150-200 take 2 units, 200-250 take 4 units, 250-300 take 6 units, 300-350 take 8 units, >350 take 10 units, Disp: 30 mL, Rfl: 3   Insulin  Pen Needle (BD PEN NEEDLE NANO 2ND GEN) 32G X 4 MM MISC, USE 1 PEN THREE TIMES DAILY AS DIRECTED, Disp: 100 each, Rfl: 3   isosorbide  mononitrate (IMDUR ) 60 MG 24 hr tablet, TAKE 1 TABLET(60 MG) BY MOUTH DAILY, Disp: 90 tablet, Rfl: 1   Lidocaine  (ZTLIDO ) 1.8 % PTCH, Apply up to 3 patches to painful areas for 12 hours, take off for 12 hours., Disp: 90 patch, Rfl: 0   Lidocaine  (ZTLIDO ) 1.8 % PTCH, Apply up to 3 patches to painful areas for 12 hours, take off for 12 hours., Disp: 90 patch, Rfl: 0   Lidocaine  (ZTLIDO ) 1.8 % PTCH, Apply up to 3 patches to painful areas for 12 hours, take off for 12 hours, Disp: 90 patch, Rfl: 0   losartan  (COZAAR ) 25 MG tablet, Take 1 tablet (25 mg total) by mouth in the morning., Disp: 90 tablet, Rfl: 3   metoprolol   succinate (TOPROL -XL) 50 MG 24 hr tablet, Take 1 tablet (50 mg total) by mouth daily. Take with or immediately following a meal., Disp: 90 tablet, Rfl: 3   MOUNJARO 15 MG/0.5ML Pen, Inject 15 mg into the skin once a week., Disp: , Rfl:    naloxone  (NARCAN ) nasal spray 4 mg/0.1 mL, Place 1 spray in 1 nostril every 2 to 3 minutes as needed for an opioid overdose alternating nostrils with each dose  until help arrives., Disp: 4 each, Rfl: 0   nitroGLYCERIN  (NITROSTAT ) 0.4 MG SL tablet, Dissolve one tablet under tongue as needed for chest pain., Disp: 30 tablet, Rfl: 0   NOVOLOG  FLEXPEN 100 UNIT/ML FlexPen, , Disp: , Rfl:    oxyCODONE  (ROXICODONE ) 15 MG immediate release tablet, Take 15 mg by mouth every 6 (six) hours., Disp: , Rfl:    oxyCODONE  (ROXICODONE ) 15 MG immediate release tablet, Take 1 tablet (15 mg total) by mouth every 6 (six) hours as needed., Disp: 120 tablet, Rfl: 0   oxyCODONE  (ROXICODONE ) 15 MG immediate release tablet, Take 1 tablet (15 mg total) by mouth every 6 (six) hours., Disp: 120 tablet, Rfl: 0   oxyCODONE  (ROXICODONE ) 15 MG immediate release tablet, Take 1 tablet (15 mg total) by mouth every 6 (six) hours as needed., Disp: 120 tablet, Rfl: 0   oxyCODONE  (ROXICODONE ) 15 MG immediate release tablet, Take 1 tablet (15 mg total) by mouth every 6 (six) hours., Disp: 120 tablet, Rfl: 0   pregabalin (LYRICA) 100 MG capsule, Take 100 mg by mouth daily., Disp: , Rfl:    REPATHA  SURECLICK 140 MG/ML SOAJ, ADMINISTER 1 ML UNDER THE SKIN INTO SKIN EVERY 14 DAYS, Disp: 6 mL, Rfl: 3   tamsulosin (FLOMAX) 0.4 MG CAPS capsule, Take 1 capsule by mouth daily., Disp: , Rfl:    tiZANidine  (ZANAFLEX ) 4 MG tablet, Take 1 tablet (4 mg total) by mouth 3 (three) times daily., Disp: 90 tablet, Rfl: 0   TRESIBA FLEXTOUCH 100 UNIT/ML FlexTouch Pen, Inject into the skin., Disp: , Rfl:   Consent:   N/A  Disposition:   ***  Patient may be asked to follow-up sooner based on the results of the above-mentioned testing.  His questions and concerns were addressed to his satisfaction. He voices understanding of the recommendations provided during this encounter.    Signed, Madonna Michele HAS, [facility]  A Division of [facility] [address], [place], [place] [address]  [place], [place] [address] 11/24/2023 1:36 PM

## 2023-12-02 ENCOUNTER — Other Ambulatory Visit (HOSPITAL_COMMUNITY): Payer: Self-pay

## 2023-12-02 MED ORDER — ZTLIDO 1.8 % EX PTCH: 3.0000 | MEDICATED_PATCH | Freq: Every day | CUTANEOUS | 0 refills | Status: DC

## 2023-12-02 MED ORDER — OXYCODONE HCL 15 MG PO TABS
15.0000 mg | ORAL_TABLET | Freq: Four times a day (QID) | ORAL | 0 refills | Status: DC
  Filled 2023-12-03: qty 120, 30d supply, fill #0

## 2023-12-02 MED ORDER — TIZANIDINE HCL 4 MG PO TABS
4.0000 mg | ORAL_TABLET | Freq: Three times a day (TID) | ORAL | 0 refills | Status: DC
  Filled 2023-12-03: qty 90, 30d supply, fill #0

## 2023-12-03 ENCOUNTER — Other Ambulatory Visit (HOSPITAL_COMMUNITY): Payer: Self-pay

## 2023-12-04 ENCOUNTER — Other Ambulatory Visit (HOSPITAL_COMMUNITY): Payer: Self-pay

## 2023-12-30 ENCOUNTER — Other Ambulatory Visit (HOSPITAL_COMMUNITY): Payer: Self-pay

## 2023-12-30 MED ORDER — ZTLIDO 1.8 % EX PTCH
3.0000 | MEDICATED_PATCH | Freq: Two times a day (BID) | CUTANEOUS | 0 refills | Status: DC
  Filled 2023-12-30: qty 90, 30d supply, fill #0

## 2023-12-30 MED ORDER — TIZANIDINE HCL 4 MG PO TABS
4.0000 mg | ORAL_TABLET | Freq: Three times a day (TID) | ORAL | 0 refills | Status: DC
  Filled 2023-12-30 – 2024-01-01 (×2): qty 90, 30d supply, fill #0

## 2023-12-30 MED ORDER — OXYCODONE HCL 15 MG PO TABS
15.0000 mg | ORAL_TABLET | Freq: Four times a day (QID) | ORAL | 0 refills | Status: DC
  Filled 2023-12-30 – 2024-01-01 (×2): qty 120, 30d supply, fill #0

## 2023-12-31 ENCOUNTER — Other Ambulatory Visit: Payer: Self-pay | Admitting: Cardiology

## 2023-12-31 ENCOUNTER — Other Ambulatory Visit (HOSPITAL_COMMUNITY): Payer: Self-pay

## 2024-01-01 ENCOUNTER — Other Ambulatory Visit (HOSPITAL_COMMUNITY): Payer: Self-pay

## 2024-01-16 ENCOUNTER — Telehealth (HOSPITAL_BASED_OUTPATIENT_CLINIC_OR_DEPARTMENT_OTHER): Payer: Self-pay

## 2024-01-16 ENCOUNTER — Other Ambulatory Visit: Payer: Self-pay | Admitting: Cardiology

## 2024-01-16 DIAGNOSIS — Z8673 Personal history of transient ischemic attack (TIA), and cerebral infarction without residual deficits: Secondary | ICD-10-CM

## 2024-01-16 DIAGNOSIS — Z955 Presence of coronary angioplasty implant and graft: Secondary | ICD-10-CM

## 2024-01-16 MED ORDER — CLOPIDOGREL BISULFATE 75 MG PO TABS: 75.0000 mg | ORAL_TABLET | Freq: Every day | ORAL | 0 refills | Status: DC

## 2024-01-16 NOTE — Telephone Encounter (Signed)
 Refill request

## 2024-01-27 ENCOUNTER — Other Ambulatory Visit (HOSPITAL_COMMUNITY): Payer: Self-pay

## 2024-01-27 MED ORDER — TIZANIDINE HCL 4 MG PO TABS
4.0000 mg | ORAL_TABLET | Freq: Three times a day (TID) | ORAL | 0 refills | Status: DC
  Filled 2024-01-27 – 2024-01-30 (×2): qty 90, 30d supply, fill #0

## 2024-01-27 MED ORDER — ZTLIDO 1.8 % EX PTCH
3.0000 | MEDICATED_PATCH | Freq: Every day | CUTANEOUS | 0 refills | Status: DC
  Filled 2024-01-27: qty 30, 30d supply, fill #0

## 2024-01-27 MED ORDER — OXYCODONE HCL 15 MG PO TABS
15.0000 mg | ORAL_TABLET | Freq: Four times a day (QID) | ORAL | 0 refills | Status: DC
  Filled 2024-01-30: qty 120, 30d supply, fill #0
  Filled ????-??-??: fill #0

## 2024-01-28 ENCOUNTER — Other Ambulatory Visit (HOSPITAL_COMMUNITY): Payer: Self-pay

## 2024-01-30 ENCOUNTER — Other Ambulatory Visit: Payer: Self-pay

## 2024-01-30 ENCOUNTER — Other Ambulatory Visit (HOSPITAL_COMMUNITY): Payer: Self-pay

## 2024-02-24 ENCOUNTER — Other Ambulatory Visit (HOSPITAL_COMMUNITY): Payer: Self-pay

## 2024-02-24 MED ORDER — ZTLIDO 1.8 % EX PTCH
3.0000 | MEDICATED_PATCH | Freq: Every day | CUTANEOUS | 0 refills | Status: DC
  Filled 2024-02-24: qty 30, 30d supply, fill #0

## 2024-02-24 MED ORDER — TIZANIDINE HCL 4 MG PO TABS
4.0000 mg | ORAL_TABLET | Freq: Three times a day (TID) | ORAL | 0 refills | Status: DC
  Filled 2024-02-24: qty 90, 30d supply, fill #0

## 2024-02-24 MED ORDER — OXYCODONE HCL 15 MG PO TABS
15.0000 mg | ORAL_TABLET | Freq: Four times a day (QID) | ORAL | 0 refills | Status: AC
  Filled 2024-02-24 – 2024-02-27 (×2): qty 120, 30d supply, fill #0

## 2024-02-26 ENCOUNTER — Other Ambulatory Visit (HOSPITAL_COMMUNITY): Payer: Self-pay

## 2024-02-26 ENCOUNTER — Ambulatory Visit: Admitting: Cardiology

## 2024-02-27 ENCOUNTER — Other Ambulatory Visit (HOSPITAL_COMMUNITY): Payer: Self-pay

## 2024-03-01 ENCOUNTER — Encounter: Payer: Self-pay | Admitting: Cardiology

## 2024-03-01 ENCOUNTER — Ambulatory Visit: Attending: Cardiology | Admitting: Cardiology

## 2024-03-01 VITALS — BP 132/76 | HR 75 | Resp 16 | Ht 67.0 in | Wt 246.8 lb

## 2024-03-01 DIAGNOSIS — E7800 Pure hypercholesterolemia, unspecified: Secondary | ICD-10-CM

## 2024-03-01 DIAGNOSIS — I2510 Atherosclerotic heart disease of native coronary artery without angina pectoris: Secondary | ICD-10-CM | POA: Diagnosis not present

## 2024-03-01 DIAGNOSIS — Z951 Presence of aortocoronary bypass graft: Secondary | ICD-10-CM

## 2024-03-01 DIAGNOSIS — I5032 Chronic diastolic (congestive) heart failure: Secondary | ICD-10-CM

## 2024-03-01 DIAGNOSIS — I129 Hypertensive chronic kidney disease with stage 1 through stage 4 chronic kidney disease, or unspecified chronic kidney disease: Secondary | ICD-10-CM

## 2024-03-01 DIAGNOSIS — Z8673 Personal history of transient ischemic attack (TIA), and cerebral infarction without residual deficits: Secondary | ICD-10-CM

## 2024-03-01 DIAGNOSIS — Z794 Long term (current) use of insulin: Secondary | ICD-10-CM

## 2024-03-01 DIAGNOSIS — N1832 Chronic kidney disease, stage 3b: Secondary | ICD-10-CM

## 2024-03-01 DIAGNOSIS — Z955 Presence of coronary angioplasty implant and graft: Secondary | ICD-10-CM

## 2024-03-01 DIAGNOSIS — E1159 Type 2 diabetes mellitus with other circulatory complications: Secondary | ICD-10-CM

## 2024-03-01 DIAGNOSIS — I6523 Occlusion and stenosis of bilateral carotid arteries: Secondary | ICD-10-CM

## 2024-03-01 NOTE — Progress Notes (Signed)
 Cardiology Office Note:  .   Date:  03/01/2024  ID:  David Burton, DOB [birth year], MRN [id number] PCP:  Patient, No Pcp Per  Former Cardiology Providers: Dr. Gordy Bergamo, Emmalene Lawrence, APRN, FNP-C  [facility] Providers Cardiologist:  Madonna Large, DO , [facility] (established care June 2024) Electrophysiologist:  None  Click to update primary MD,subspecialty MD or APP then REFRESH:1}    Chief Complaint  Patient presents with   Coronary artery disease involving native coronary artery of   Chronic heart failure with preserved ejection fraction (HFp    History of Present Illness: .   David Burton is a 78 y.o. Caucasian male whose past medical history and cardiovascular risk factors includes: Chronic HFpEF, NSTEMI in Dec 2018, s/p CABG x 4 on 05/08/2017 (LIMA to LAD, SVG to PDA, SVG to Diagonal, SVG to Ramus. Surgery done at [facility], [place]), diabetes mellitus type 2 with peripheral neuropathy, hypertension, hypercholesterolemia, CVA (09/2019), advanced age.   Patient being followed by the practice given his CAD and HFpEF.  He presents today for 37-month follow-up visit.  Over the last 9 months patient denies anginal chest pain or heart failure symptoms. His overall function capacity remains relatively stable, still tries to walk 0.5 to 1 miles every 3-4 times per week Continues to lose weight with pharmacological therapy and dietary changes, additional 14 pound weight loss since last office visit. Follows with nephrology at [facility] no longer taking NSAIDs for pain control. Ambulates with a walker At last office visit started him on low-dose losartan  which was tolerated well, follow-up labs 06/11/2023 notes relatively stable renal function with slight uptrend of creatinine secondary to ARB initiation  Review of Systems: .   Review of Systems  Cardiovascular:  Negative for chest pain, claudication, irregular heartbeat, leg swelling, near-syncope, orthopnea, palpitations,  paroxysmal nocturnal dyspnea and syncope.  Respiratory:  Negative for shortness of breath.   Hematologic/Lymphatic: Negative for bleeding problem.    Studies Reviewed:   Coronary artery bypass grafting: s/p CABG x 4 on 05/08/2017 (LIMA to LAD, SVG to PDA, SVG to Diagonal, SVG to Ramus.   EKG: EKG Interpretation Date/Time:  Monday March 01 2024 08:58:13 EDT Ventricular Rate:  74 PR Interval:  164 QRS Duration:  114 QT Interval:  404 QTC Calculation: 448 R Axis:   -41  Text Interpretation: Normal sinus rhythm Left axis deviation Incomplete right bundle branch block Inferior infarct , age undetermined Possible Anterior infarct , age undetermined When compared with ECG of 16-Sep-2019 08:15, No significant change was found Confirmed by Large Madonna [phone]) on 03/01/2024 9:19:31 AM   Echocardiogram: 08/21/2021:  Normal LV systolic function with visual EF 55-60%. Left ventricle cavity is normal in size. Normal left ventricular wall thickness. Normal global wall motion. Doppler evidence of grade II (pseudonormal) diastolic dysfunction, elevated LAP.  Mild (Grade I) mitral regurgitation.  Mild tricuspid regurgitation. No evidence of pulmonary hypertension. RVSP measures 37 mmHg.  Compared to study 09/17/2019 mild MR and TR are new otherwise no significant change.    Heart Catheterization: Coronary angiogram 01/05/2019: Normal LVEDP, severe native vessel disease.  High-grade proximal RCA 90 to 95% stenosis followed by 70 to 80% stenosis in the in-stent restenotic proximal segment.  Large RCA.  SVG to RCA could not be visualized in spite of multiple catheter attempts, and presumed occluded. Left main is large and mildly diseased but patent.  LAD is occluded in the midsegment after the origin of D1, which has ostial 90% stenosis.  SVG to D1 is patent.  LIMA to LAD is patent. Ramus intermediate: Large vessel, mild to moderate calcific 80 to 85% stenosis in the proximal segment.  SVG to ramus  intermediate is occluded. Circumflex: High-grade 99%/subtotal occlusion in the ostium.  Moderate to large sized vessel.  Small marginals.   Coronary intervention 01/26/2019: Successful PTCA and stenting of the proximal mid and mid to distal RCA with implantation of 2 overlapping 3.0 x 38 mm Promus Synergy DES, stenosis reduced from 90% to 0% with TIMI-3 to TIMI-3 flow.     Recommendation: With medical high-grade ramus intermediate and circumflex stenosis, he has excellent brisk flow there and ischemia was in the inferior wall, however if he still persist to have recurrence of angina pectoris we will schedule him for complex intervention to the left coronary system.  In view of his underlying comorbidity, would recommend medical therapy   Carotid duplex: 09/17/2019: Right Carotid: Velocities in the right ICA are consistent with a 1-39% stenosis.  Left Carotid: Velocities in the left ICA are consistent with a 1-39% stenosis.  Vertebrals: Left vertebral artery demonstrates antegrade flow. Right vertebral artery was not visualized  Carotid duplex December 2024: Right ICA 1-39% stenosis. Left ICA 40-59% stenosis. Bilateral vertebral artery antegrade flow.    14 - day [facility]iac Ambulatory Telemetry.  Dominant rhythm normal sinus. Heart rate 59-156 bpm.  Avg HR 85 bpm. No atrial fibrillation, ventricular tachycardia, high grade AV block, pauses (3 seconds or longer). Total ventricular and supraventricular ectopic burden <1%. Two episode of supraventricular tachycardia. Longest and fastest episode was 17 beats at maximum HR of 156 bpm. Patient triggered events: 0.  RADIOLOGY: MR Brain w/o contrast:  IMPRESSION: 1. Mildly motion degraded exam. 2. Confirmed 17 mm acute/early subacute infarct within the right pons. 3. Background mild chronic small vessel ischemic disease within the cerebral white matter and pons. Tiny chronic lacunar infarct within the left cerebellum. 4. Mild generalized  parenchymal atrophy. 5. Mild paranasal sinus mucosal thickening. Trace left mastoid effusion.  Risk Assessment/Calculations:   N/A   Labs:       Latest Ref Rng & Units 01/13/2023   11:11 AM 12/13/2020    1:28 PM 09/15/2020    1:47 AM  CBC  WBC 3.4 - 10.8 x10E3/uL 8.3  7.8  10.5   Hemoglobin 13.0 - 17.7 g/dL 85.4  86.3  87.5   Hematocrit 37.5 - 51.0 % 43.5  41.3  35.0   Platelets 150 - 450 x10E3/uL 156  141  178        Latest Ref Rng & Units 06/11/2023   12:55 PM 02/04/2023   10:48 AM 01/13/2023   11:00 AM  BMP  Glucose 70 - 99 mg/dL 891  844  846   BUN 8 - 27 mg/dL 32  24  24   Creatinine 0.76 - 1.27 mg/dL 7.79  7.96  8.07   BUN/Creat Ratio 10 - 24 15  12  13    Sodium 134 - 144 mmol/L 139  138  141   Potassium 3.5 - 5.2 mmol/L 4.2  4.5  4.5   Chloride 96 - 106 mmol/L 101  104  103   CO2 20 - 29 mmol/L 22  22  22    Calcium  8.6 - 10.2 mg/dL 89.9  9.6  89.8       Latest Ref Rng & Units 06/11/2023   12:55 PM 02/04/2023   10:48 AM 01/13/2023   11:00 AM  CMP  Glucose 70 -  99 mg/dL 891  844  846   BUN 8 - 27 mg/dL 32  24  24   Creatinine 0.76 - 1.27 mg/dL 7.79  7.96  8.07   Sodium 134 - 144 mmol/L 139  138  141   Potassium 3.5 - 5.2 mmol/L 4.2  4.5  4.5   Chloride 96 - 106 mmol/L 101  104  103   CO2 20 - 29 mmol/L 22  22  22    Calcium  8.6 - 10.2 mg/dL 89.9  9.6  89.8     Lab Results  Component Value Date   CHOL 79 (L) 05/19/2023   HDL 38 (L) 05/19/2023   LDLCALC 22 05/19/2023   LDLDIRECT 50 12/31/2022   TRIG 97 05/19/2023   CHOLHDL 2.1 05/19/2023   No results for input(s): LIPOA in the last 8760 hours. No components found for: NTPROBNP No results for input(s): PROBNP in the last 8760 hours.  No results for input(s): TSH in the last 8760 hours.   Physical Exam:    Today's Vitals   03/01/24 0855  BP: 132/76  Pulse: 75  Resp: 16  SpO2: 97%  Weight: 246 lb 12.8 oz (111.9 kg)  Height: 5' 7 (1.702 m)   Body mass index is 38.65 kg/m. Wt Readings from  Last 3 Encounters:  03/01/24 246 lb 12.8 oz (111.9 kg)  05/22/23 260 lb 9.6 oz (118.2 kg)  11/11/22 275 lb (124.7 kg)    Physical Exam  Constitutional: No distress. He appears chronically ill.  Ambulates with a walker, hemodynamically stable.   Neck: No JVD present.  Cardiovascular: Normal rate, regular rhythm, S1 normal, S2 normal, intact distal pulses and normal pulses. Exam reveals no gallop, no S3 and no S4.  No murmur heard. Pulmonary/Chest: Effort normal and breath sounds normal. No stridor. He has no wheezes. He has no rales.  Abdominal: Soft. Bowel sounds are normal. He exhibits no distension. There is no abdominal tenderness.  Obesity  Musculoskeletal:        General: No edema.     Cervical back: Neck supple.  Neurological: He is alert and oriented to person, place, and time. He has intact cranial nerves (2-12).  Skin: Skin is warm and moist.   Impression & Recommendation(s):  Impression:   ICD-10-CM   1. Coronary artery disease involving native coronary artery of native heart without angina pectoris  I25.10     2. S/P CABG x 4  Z95.1     3. S/P primary angioplasty with coronary stent  Z95.5     4. Chronic heart failure with preserved ejection fraction (HFpEF) (HCC)  I50.32 EKG 12-Lead    5. Hypertensive kidney disease with stage 3b chronic kidney disease (HCC)  I12.9    N18.32     6. History of stroke  Z86.73     7. Pure hypercholesterolemia  E78.00     8. Type 2 diabetes mellitus with other circulatory complication, with long-term current use of insulin  (HCC)  E11.59    Z79.4        Recommendation(s):  Coronary artery disease involving native coronary artery of native heart without angina pectoris S/P CABG x 4 Chronic heart failure with preserved ejection fraction (HFpEF) (HCC) Stage C, NYHA class II Continue antiplatelet therapy. Continue Farxiga  10 mg p.o. daily. Continue Lasix 40 mg p.o. daily. Continue Imdur  60 mg p.o. daily. Continue losartan  25 mg  p.o. daily. Continue Toprol -XL 50 mg p.o. daily No use of sublingual nitroglycerin  tablets since the last  office visit. No hospitalizations for heart failure since last office visit, 9 months ago Continue current medical therapy  Atherosclerosis of both carotid arteries Left ICA disease progressed compared to prior study. Continue antiplatelet therapy and lipid-lowering agents. Will reschedule his carotid duplex in December 2025 to March 2026, closer to follow-up appointment Patient is aware of strokelike symptoms and if present should go to closest ER via EMS.  Type 2 diabetes mellitus with other circulatory complication, with long-term current use of insulin  (HCC) Reemphasized importance of glycemic control. He has been lost approximately 14 pounds in the last office visit due to lifestyle changes and implementation of Mounjaro.  Pure hypercholesterolemia Currently on Repatha  Continue Lipitor 20 mg p.o. daily. Continue Zetia  10 mg p.o. daily. Continue fenofibrate  iron 45 mg p.o. daily He denies myalgia or other side effects.  History of stroke Reemphasized the importance of secondary prevention with focus on improving her modifiable cardiovascular risk factors such as glycemic control, lipid management, blood pressure control, weight loss.  Hypertensive kidney disease with stage 3b chronic kidney disease (HCC) Office blood pressures are very well-controlled. Follows with nephrology in [facility].   No longer uses high doses of NSAIDs for pain control. Medications as discussed   Orders Placed:  Orders Placed This Encounter  Procedures   EKG 12-Lead    Final Medication List:    No orders of the defined types were placed in this encounter.   Medications Discontinued During This Encounter  Medication Reason   NOVOLOG  FLEXPEN 100 UNIT/ML FlexPen Patient Preference   Lidocaine  (ZTLIDO ) 1.8 % [facility] Duplicate   Lidocaine  (ZTLIDO ) 1.8 % [facility] Duplicate   Lidocaine  (ZTLIDO ) 1.8 %  [facility] Patient Preference     Current Outpatient Medications:    aspirin  81 MG chewable tablet, Chew 1 tablet by mouth daily., Disp: , Rfl:    atorvastatin  (LIPITOR) 20 MG tablet, TAKE 1 TABLET(20 MG) BY MOUTH DAILY (Patient taking differently: Take 20 mg by mouth daily.), Disp: 90 tablet, Rfl: 1   clopidogrel  (PLAVIX ) 75 MG tablet, Take 1 tablet (75 mg total) by mouth daily., Disp: 90 tablet, Rfl: 0   Continuous Blood Gluc Sensor (DEXCOM G6 SENSOR) MISC, Use to test blood sugar once daily. Dx: E11.42, Disp: 9 each, Rfl: 3   Continuous Blood Gluc Transmit (DEXCOM G6 TRANSMITTER) MISC, Use to test blood sugar once daily. Dx: E11.42, Disp: 1 each, Rfl: 0   ezetimibe  (ZETIA ) 10 MG tablet, TAKE 1 TABLET(10 MG) BY MOUTH DAILY, Disp: 90 tablet, Rfl: 3   FARXIGA  10 MG TABS tablet, TAKE 1 TABLET BY MOUTH DAILY BEFORE BREAKFAST, Disp: 90 tablet, Rfl: 0   fenofibrate  (TRICOR ) 145 MG tablet, TAKE 1 TABLET BY MOUTH EVERY DAY, Disp: 30 tablet, Rfl: 3   furosemide (LASIX) 40 MG tablet, Take 40 mg by mouth every morning., Disp: , Rfl:    glucose blood (ACCU-CHEK AVIVA PLUS) test strip, Use to test blood sugar 3 times daily. Dx: E11.42, Disp: 300 each, Rfl: 1   insulin  aspart (NOVOLOG  FLEXPEN) 100 UNIT/ML FlexPen, Use depending on blood sugar: <150 take 0 units, 150-200 take 2 units, 200-250 take 4 units, 250-300 take 6 units, 300-350 take 8 units, >350 take 10 units, Disp: 30 mL, Rfl: 3   Insulin  Pen Needle (BD PEN NEEDLE NANO 2ND GEN) 32G X 4 MM MISC, USE 1 PEN THREE TIMES DAILY AS DIRECTED, Disp: 100 each, Rfl: 3   isosorbide  mononitrate (IMDUR ) 60 MG 24 hr tablet, TAKE 1 TABLET(60 MG) BY MOUTH  DAILY, Disp: 90 tablet, Rfl: 1   losartan  (COZAAR ) 25 MG tablet, Take 1 tablet (25 mg total) by mouth in the morning., Disp: 90 tablet, Rfl: 3   metoprolol  succinate (TOPROL -XL) 50 MG 24 hr tablet, Take 1 tablet (50 mg total) by mouth daily. Take with or immediately following a meal., Disp: 90 tablet, Rfl: 3   MOUNJARO 15  MG/0.5ML Pen, Inject 15 mg into the skin once a week., Disp: , Rfl:    naloxone  (NARCAN ) nasal spray 4 mg/0.1 mL, Place 1 spray in 1 nostril every 2 to 3 minutes as needed for an opioid overdose alternating nostrils with each dose until help arrives., Disp: 4 each, Rfl: 0   nitroGLYCERIN  (NITROSTAT ) 0.4 MG SL tablet, Dissolve one tablet under tongue as needed for chest pain., Disp: 30 tablet, Rfl: 0   oxyCODONE  (ROXICODONE ) 15 MG immediate release tablet, Take 1 tablet (15 mg total) by mouth every 6 (six) hours., Disp: 120 tablet, Rfl: 0   pregabalin (LYRICA) 100 MG capsule, Take 100 mg by mouth daily., Disp: , Rfl:    REPATHA  SURECLICK 140 MG/ML SOAJ, ADMINISTER 1 ML UNDER THE SKIN INTO SKIN EVERY 14 DAYS, Disp: 6 mL, Rfl: 3   tiZANidine  (ZANAFLEX ) 4 MG tablet, Take 1 tablet (4 mg total) by mouth 3 (three) times daily., Disp: 90 tablet, Rfl: 0   TRESIBA FLEXTOUCH 100 UNIT/ML FlexTouch Pen, Inject into the skin., Disp: , Rfl:    tamsulosin (FLOMAX) 0.4 MG CAPS capsule, Take 1 capsule by mouth daily. (Patient not taking: Reported on 03/01/2024), Disp: , Rfl:   Consent:   N/A  Disposition:   46-month follow-up.  Patient may be asked to follow-up sooner based on the results of the above-mentioned testing.  His questions and concerns were addressed to his satisfaction. He voices understanding of the recommendations provided during this encounter.    Signed, Madonna Michele HAS, [facility]  A Division of [facility] [address] [place], [place] [address]  03/01/2024 9:37 AM

## 2024-03-01 NOTE — Addendum Note (Signed)
 Addended by: RANDY HAMP SAILOR on: 03/01/2024 09:43 AM   Modules accepted: Orders

## 2024-03-01 NOTE — Patient Instructions (Addendum)
 Related.  Positive right disease if he still here then it is still here this ask that he has lost a lot of pounds I thought I was is attributing it to Mounjaro medication Instructions:  Your physician recommends that you continue on your current medications as directed. Please refer to the Current Medication list given to you today.  *If you need a refill on your cardiac medications before your next appointment, please call your pharmacy*  Lab Work: NONE  If you have labs (blood work) drawn today and your tests are completely normal, you will receive your results only by: MyChart Message (if you have MyChart) OR A paper copy in the mail If you have any lab test that is abnormal or we need to change your treatment, we will call you to review the results.  Testing/Procedures: MARCH 2026 - - - Your physician has requested that you have a carotid duplex. This test is an ultrasound of the carotid arteries in your neck. It looks at blood flow through these arteries that supply the brain with blood. Allow one hour for this exam. There are no restrictions or special instructions.   Follow-Up: At [facility], you and your health needs are our priority.  As part of our continuing mission to provide you with exceptional heart care, our providers are all part of one team.  This team includes your primary Cardiologist (physician) and Advanced Practice Providers or APPs (Physician Assistants and Nurse Practitioners) who all work together to provide you with the care you need, when you need it.  Your next appointment:   6-7 month(s)  Provider:   Madonna Large, DO

## 2024-03-07 ENCOUNTER — Other Ambulatory Visit: Payer: Self-pay | Admitting: Cardiology

## 2024-03-23 ENCOUNTER — Other Ambulatory Visit (HOSPITAL_COMMUNITY): Payer: Self-pay

## 2024-03-23 MED ORDER — TIZANIDINE HCL 4 MG PO TABS
4.0000 mg | ORAL_TABLET | Freq: Three times a day (TID) | ORAL | 0 refills | Status: AC
  Filled 2024-03-23: qty 90, 30d supply, fill #0

## 2024-03-23 MED ORDER — OXYCODONE HCL 15 MG PO TABS
15.0000 mg | ORAL_TABLET | Freq: Four times a day (QID) | ORAL | 0 refills | Status: AC | PRN
  Filled 2024-03-26: qty 120, 30d supply, fill #0

## 2024-03-23 MED ORDER — ZTLIDO 1.8 % EX PTCH
3.0000 | MEDICATED_PATCH | Freq: Two times a day (BID) | CUTANEOUS | 0 refills | Status: DC
  Filled 2024-03-23: qty 90, 30d supply, fill #0

## 2024-03-24 ENCOUNTER — Other Ambulatory Visit (HOSPITAL_COMMUNITY): Payer: Self-pay

## 2024-03-25 ENCOUNTER — Other Ambulatory Visit (HOSPITAL_COMMUNITY): Payer: Self-pay

## 2024-03-26 ENCOUNTER — Other Ambulatory Visit (HOSPITAL_COMMUNITY): Payer: Self-pay

## 2024-04-19 ENCOUNTER — Other Ambulatory Visit (HOSPITAL_BASED_OUTPATIENT_CLINIC_OR_DEPARTMENT_OTHER): Payer: Self-pay | Admitting: Cardiology

## 2024-04-19 DIAGNOSIS — Z955 Presence of coronary angioplasty implant and graft: Secondary | ICD-10-CM

## 2024-04-19 DIAGNOSIS — Z8673 Personal history of transient ischemic attack (TIA), and cerebral infarction without residual deficits: Secondary | ICD-10-CM

## 2024-04-20 ENCOUNTER — Other Ambulatory Visit (HOSPITAL_COMMUNITY): Payer: Self-pay

## 2024-04-20 MED ORDER — OXYCODONE HCL 15 MG PO TABS
15.0000 mg | ORAL_TABLET | Freq: Four times a day (QID) | ORAL | 0 refills | Status: DC
  Filled 2024-04-20 – 2024-04-23 (×2): qty 120, 30d supply, fill #0

## 2024-04-20 MED ORDER — TIZANIDINE HCL 4 MG PO CAPS
4.0000 mg | ORAL_CAPSULE | Freq: Three times a day (TID) | ORAL | 0 refills | Status: DC
  Filled 2024-04-20: qty 90, 30d supply, fill #0

## 2024-04-20 MED ORDER — ZTLIDO 1.8 % EX PTCH
3.0000 | MEDICATED_PATCH | Freq: Every day | CUTANEOUS | 0 refills | Status: DC
  Filled 2024-04-20: qty 90, 30d supply, fill #0

## 2024-04-21 ENCOUNTER — Other Ambulatory Visit (HOSPITAL_COMMUNITY): Payer: Self-pay

## 2024-04-23 ENCOUNTER — Other Ambulatory Visit (HOSPITAL_COMMUNITY): Payer: Self-pay

## 2024-05-17 ENCOUNTER — Encounter (HOSPITAL_COMMUNITY): Payer: 59

## 2024-05-18 ENCOUNTER — Other Ambulatory Visit (HOSPITAL_COMMUNITY): Payer: Self-pay

## 2024-05-18 MED ORDER — OXYCODONE HCL 15 MG PO TABS
15.0000 mg | ORAL_TABLET | Freq: Four times a day (QID) | ORAL | 0 refills | Status: DC
  Filled ????-??-??: fill #0

## 2024-05-18 MED ORDER — ZTLIDO 1.8 % EX PTCH
3.0000 | MEDICATED_PATCH | Freq: Every day | CUTANEOUS | 0 refills | Status: DC
  Filled 2024-05-18: qty 90, 30d supply, fill #0

## 2024-05-18 MED ORDER — TIZANIDINE HCL 4 MG PO CAPS
4.0000 mg | ORAL_CAPSULE | Freq: Three times a day (TID) | ORAL | 0 refills | Status: DC
  Filled 2024-05-18: qty 90, 30d supply, fill #0

## 2024-05-19 ENCOUNTER — Other Ambulatory Visit: Payer: Self-pay

## 2024-05-19 ENCOUNTER — Other Ambulatory Visit (HOSPITAL_COMMUNITY): Payer: Self-pay

## 2024-05-20 ENCOUNTER — Other Ambulatory Visit (HOSPITAL_COMMUNITY): Payer: Self-pay

## 2024-05-21 ENCOUNTER — Other Ambulatory Visit: Payer: Self-pay

## 2024-06-15 ENCOUNTER — Encounter (HOSPITAL_COMMUNITY): Payer: Self-pay

## 2024-06-15 ENCOUNTER — Other Ambulatory Visit (HOSPITAL_COMMUNITY): Payer: Self-pay

## 2024-06-15 MED ORDER — TIZANIDINE HCL 4 MG PO CAPS
4.0000 mg | ORAL_CAPSULE | Freq: Three times a day (TID) | ORAL | 0 refills | Status: AC
  Filled 2024-06-15 – 2024-06-21 (×2): qty 90, 30d supply, fill #0

## 2024-06-15 MED ORDER — ZTLIDO 1.8 % EX PTCH
MEDICATED_PATCH | Freq: Every day | CUTANEOUS | 0 refills | Status: AC
  Filled 2024-06-15: qty 90, 30d supply, fill #0

## 2024-06-17 ENCOUNTER — Other Ambulatory Visit (HOSPITAL_COMMUNITY): Payer: Self-pay

## 2024-06-18 ENCOUNTER — Other Ambulatory Visit (HOSPITAL_COMMUNITY): Payer: Self-pay

## 2024-06-18 MED ORDER — OXYCODONE HCL 15 MG PO TABS
15.0000 mg | ORAL_TABLET | Freq: Four times a day (QID) | ORAL | 0 refills | Status: AC
  Filled 2024-06-18: qty 120, 30d supply, fill #0

## 2024-06-21 ENCOUNTER — Other Ambulatory Visit: Payer: Self-pay

## 2024-06-21 ENCOUNTER — Other Ambulatory Visit (HOSPITAL_COMMUNITY): Payer: Self-pay

## 2024-08-02 ENCOUNTER — Encounter (HOSPITAL_COMMUNITY)
